# Patient Record
Sex: Female | Born: 1970
Health system: Southern US, Community
[De-identification: ages and names within clinical notes are randomized; demographics above are authoritative.]

## PROBLEM LIST (undated history)

## (undated) DIAGNOSIS — F410 Panic disorder [episodic paroxysmal anxiety] without agoraphobia: Secondary | ICD-10-CM

## (undated) DIAGNOSIS — R569 Unspecified convulsions: Secondary | ICD-10-CM

## (undated) DIAGNOSIS — F4310 Post-traumatic stress disorder, unspecified: Secondary | ICD-10-CM

## (undated) DIAGNOSIS — I10 Essential (primary) hypertension: Secondary | ICD-10-CM

## (undated) DIAGNOSIS — T7840XA Allergy, unspecified, initial encounter: Secondary | ICD-10-CM

## (undated) DIAGNOSIS — E119 Type 2 diabetes mellitus without complications: Secondary | ICD-10-CM

## (undated) DIAGNOSIS — F1910 Other psychoactive substance abuse, uncomplicated: Secondary | ICD-10-CM

## (undated) DIAGNOSIS — G709 Myoneural disorder, unspecified: Secondary | ICD-10-CM

## (undated) DIAGNOSIS — F419 Anxiety disorder, unspecified: Secondary | ICD-10-CM

## (undated) DIAGNOSIS — G9600 Cerebrospinal fluid leak, unspecified: Secondary | ICD-10-CM

## (undated) DIAGNOSIS — G4730 Sleep apnea, unspecified: Secondary | ICD-10-CM

## (undated) DIAGNOSIS — Z9889 Other specified postprocedural states: Secondary | ICD-10-CM

## (undated) DIAGNOSIS — M1990 Unspecified osteoarthritis, unspecified site: Secondary | ICD-10-CM

## (undated) DIAGNOSIS — D649 Anemia, unspecified: Secondary | ICD-10-CM

## (undated) DIAGNOSIS — C569 Malignant neoplasm of unspecified ovary: Secondary | ICD-10-CM

## (undated) DIAGNOSIS — K219 Gastro-esophageal reflux disease without esophagitis: Secondary | ICD-10-CM

## (undated) HISTORY — DX: Anemia, unspecified: D64.9

## (undated) HISTORY — DX: Unspecified convulsions: R56.9

## (undated) HISTORY — DX: Gastro-esophageal reflux disease without esophagitis: K21.9

## (undated) HISTORY — DX: Essential (primary) hypertension: I10

## (undated) HISTORY — PX: KNEE ARTHROSCOPY: SUR90

## (undated) HISTORY — DX: Malignant neoplasm of unspecified ovary: C56.9

## (undated) HISTORY — DX: Panic disorder (episodic paroxysmal anxiety): F41.0

## (undated) HISTORY — PX: SKIN CANCER EXCISION: SHX779

## (undated) HISTORY — DX: Unspecified osteoarthritis, unspecified site: M19.90

## (undated) HISTORY — PX: ABDOMINAL HYSTERECTOMY: SHX81

## (undated) HISTORY — PX: HERNIA REPAIR: SHX51

## (undated) HISTORY — DX: Myoneural disorder, unspecified: G70.9

## (undated) HISTORY — DX: Cerebrospinal fluid leak, unspecified: G96.00

## (undated) HISTORY — PX: PARTIAL HYSTERECTOMY: SHX80

## (undated) HISTORY — DX: Other psychoactive substance abuse, uncomplicated: F19.10

## (undated) HISTORY — DX: Other specified postprocedural states: Z98.890

## (undated) HISTORY — DX: Post-traumatic stress disorder, unspecified: F43.10

## (undated) HISTORY — DX: Anxiety disorder, unspecified: F41.9

## (undated) HISTORY — PX: TONSILLECTOMY: SUR1361

## (undated) HISTORY — PX: CHOLECYSTECTOMY: SHX55

## (undated) HISTORY — DX: Allergy, unspecified, initial encounter: T78.40XA

---

## 1997-02-06 DIAGNOSIS — Z9889 Other specified postprocedural states: Secondary | ICD-10-CM

## 1997-02-06 HISTORY — DX: Other specified postprocedural states: Z98.890

## 1998-01-17 ENCOUNTER — Emergency Department (HOSPITAL_COMMUNITY): Admission: EM | Admit: 1998-01-17 | Discharge: 1998-01-17 | Payer: Self-pay | Admitting: Emergency Medicine

## 1998-03-21 ENCOUNTER — Emergency Department (HOSPITAL_COMMUNITY): Admission: EM | Admit: 1998-03-21 | Discharge: 1998-03-21 | Payer: Self-pay | Admitting: Emergency Medicine

## 1998-11-03 ENCOUNTER — Emergency Department (HOSPITAL_COMMUNITY): Admission: EM | Admit: 1998-11-03 | Discharge: 1998-11-03 | Payer: Self-pay | Admitting: Emergency Medicine

## 2001-02-04 ENCOUNTER — Emergency Department (HOSPITAL_COMMUNITY): Admission: EM | Admit: 2001-02-04 | Discharge: 2001-02-04 | Payer: Self-pay | Admitting: Emergency Medicine

## 2001-02-04 ENCOUNTER — Encounter: Payer: Self-pay | Admitting: Emergency Medicine

## 2001-02-12 ENCOUNTER — Emergency Department (HOSPITAL_COMMUNITY): Admission: EM | Admit: 2001-02-12 | Discharge: 2001-02-12 | Payer: Self-pay | Admitting: *Deleted

## 2001-02-12 ENCOUNTER — Encounter: Payer: Self-pay | Admitting: *Deleted

## 2001-11-28 ENCOUNTER — Ambulatory Visit (HOSPITAL_COMMUNITY): Admission: RE | Admit: 2001-11-28 | Discharge: 2001-11-28 | Payer: Self-pay | Admitting: Pulmonary Disease

## 2002-05-23 ENCOUNTER — Encounter: Payer: Self-pay | Admitting: Emergency Medicine

## 2002-05-23 ENCOUNTER — Emergency Department (HOSPITAL_COMMUNITY): Admission: EM | Admit: 2002-05-23 | Discharge: 2002-05-23 | Payer: Self-pay | Admitting: Emergency Medicine

## 2002-08-03 ENCOUNTER — Emergency Department (HOSPITAL_COMMUNITY): Admission: EM | Admit: 2002-08-03 | Discharge: 2002-08-03 | Payer: Self-pay | Admitting: Emergency Medicine

## 2002-08-04 ENCOUNTER — Encounter: Payer: Self-pay | Admitting: *Deleted

## 2002-08-04 ENCOUNTER — Emergency Department (HOSPITAL_COMMUNITY): Admission: EM | Admit: 2002-08-04 | Discharge: 2002-08-05 | Payer: Self-pay | Admitting: *Deleted

## 2002-11-28 ENCOUNTER — Emergency Department (HOSPITAL_COMMUNITY): Admission: EM | Admit: 2002-11-28 | Discharge: 2002-11-28 | Payer: Self-pay | Admitting: Emergency Medicine

## 2002-12-05 ENCOUNTER — Emergency Department (HOSPITAL_COMMUNITY): Admission: EM | Admit: 2002-12-05 | Discharge: 2002-12-05 | Payer: Self-pay | Admitting: Emergency Medicine

## 2003-07-21 ENCOUNTER — Ambulatory Visit: Admission: RE | Admit: 2003-07-21 | Discharge: 2003-07-21 | Payer: Self-pay | Admitting: Gynecology

## 2003-07-28 ENCOUNTER — Inpatient Hospital Stay (HOSPITAL_COMMUNITY): Admission: RE | Admit: 2003-07-28 | Discharge: 2003-07-29 | Payer: Self-pay | Admitting: Obstetrics

## 2003-07-28 ENCOUNTER — Encounter (INDEPENDENT_AMBULATORY_CARE_PROVIDER_SITE_OTHER): Payer: Self-pay | Admitting: Specialist

## 2004-02-11 ENCOUNTER — Emergency Department (HOSPITAL_COMMUNITY): Admission: EM | Admit: 2004-02-11 | Discharge: 2004-02-11 | Payer: Self-pay | Admitting: Emergency Medicine

## 2004-05-08 ENCOUNTER — Emergency Department (HOSPITAL_COMMUNITY): Admission: EM | Admit: 2004-05-08 | Discharge: 2004-05-09 | Payer: Self-pay | Admitting: Emergency Medicine

## 2004-07-05 ENCOUNTER — Emergency Department (HOSPITAL_COMMUNITY): Admission: EM | Admit: 2004-07-05 | Discharge: 2004-07-05 | Payer: Self-pay | Admitting: Emergency Medicine

## 2004-09-29 ENCOUNTER — Emergency Department (HOSPITAL_COMMUNITY): Admission: EM | Admit: 2004-09-29 | Discharge: 2004-09-29 | Payer: Self-pay | Admitting: Emergency Medicine

## 2005-03-30 ENCOUNTER — Emergency Department (HOSPITAL_COMMUNITY): Admission: EM | Admit: 2005-03-30 | Discharge: 2005-03-30 | Payer: Self-pay | Admitting: Emergency Medicine

## 2005-09-14 ENCOUNTER — Emergency Department (HOSPITAL_COMMUNITY): Admission: EM | Admit: 2005-09-14 | Discharge: 2005-09-14 | Payer: Self-pay | Admitting: Emergency Medicine

## 2005-11-08 ENCOUNTER — Ambulatory Visit (HOSPITAL_COMMUNITY): Admission: RE | Admit: 2005-11-08 | Discharge: 2005-11-08 | Payer: Self-pay | Admitting: Internal Medicine

## 2006-02-06 DIAGNOSIS — Z9889 Other specified postprocedural states: Secondary | ICD-10-CM

## 2006-02-06 HISTORY — PX: ESOPHAGOGASTRODUODENOSCOPY: SHX1529

## 2006-02-06 HISTORY — DX: Other specified postprocedural states: Z98.890

## 2006-05-28 ENCOUNTER — Emergency Department (HOSPITAL_COMMUNITY): Admission: EM | Admit: 2006-05-28 | Discharge: 2006-05-28 | Payer: Self-pay | Admitting: Emergency Medicine

## 2006-10-20 ENCOUNTER — Emergency Department (HOSPITAL_COMMUNITY): Admission: EM | Admit: 2006-10-20 | Discharge: 2006-10-21 | Payer: Self-pay | Admitting: *Deleted

## 2006-10-24 ENCOUNTER — Ambulatory Visit (HOSPITAL_COMMUNITY): Admission: RE | Admit: 2006-10-24 | Discharge: 2006-10-24 | Payer: Self-pay | Admitting: Internal Medicine

## 2006-11-16 ENCOUNTER — Ambulatory Visit: Payer: Self-pay | Admitting: Gastroenterology

## 2006-11-27 ENCOUNTER — Ambulatory Visit: Payer: Self-pay | Admitting: Gastroenterology

## 2006-11-27 ENCOUNTER — Ambulatory Visit (HOSPITAL_COMMUNITY): Admission: RE | Admit: 2006-11-27 | Discharge: 2006-11-27 | Payer: Self-pay | Admitting: Gastroenterology

## 2006-11-27 ENCOUNTER — Encounter: Payer: Self-pay | Admitting: Gastroenterology

## 2007-01-09 ENCOUNTER — Ambulatory Visit: Payer: Self-pay | Admitting: Gastroenterology

## 2007-01-10 ENCOUNTER — Ambulatory Visit (HOSPITAL_COMMUNITY): Admission: RE | Admit: 2007-01-10 | Discharge: 2007-01-10 | Payer: Self-pay | Admitting: Internal Medicine

## 2007-01-18 ENCOUNTER — Ambulatory Visit (HOSPITAL_COMMUNITY): Admission: RE | Admit: 2007-01-18 | Discharge: 2007-01-18 | Payer: Self-pay | Admitting: Gastroenterology

## 2007-02-15 ENCOUNTER — Emergency Department (HOSPITAL_COMMUNITY): Admission: EM | Admit: 2007-02-15 | Discharge: 2007-02-15 | Payer: Self-pay | Admitting: Emergency Medicine

## 2007-02-22 ENCOUNTER — Ambulatory Visit: Payer: Self-pay | Admitting: Gastroenterology

## 2007-02-26 ENCOUNTER — Encounter (HOSPITAL_COMMUNITY): Admission: RE | Admit: 2007-02-26 | Discharge: 2007-03-28 | Payer: Self-pay | Admitting: Gastroenterology

## 2007-04-23 ENCOUNTER — Encounter: Payer: Self-pay | Admitting: Orthopedic Surgery

## 2007-04-23 ENCOUNTER — Ambulatory Visit: Payer: Self-pay | Admitting: Gastroenterology

## 2007-04-26 ENCOUNTER — Ambulatory Visit (HOSPITAL_COMMUNITY): Admission: RE | Admit: 2007-04-26 | Discharge: 2007-04-26 | Payer: Self-pay | Admitting: Gastroenterology

## 2007-06-10 ENCOUNTER — Emergency Department (HOSPITAL_COMMUNITY): Admission: EM | Admit: 2007-06-10 | Discharge: 2007-06-10 | Payer: Self-pay | Admitting: Emergency Medicine

## 2007-06-13 ENCOUNTER — Emergency Department (HOSPITAL_COMMUNITY): Admission: EM | Admit: 2007-06-13 | Discharge: 2007-06-13 | Payer: Self-pay | Admitting: Emergency Medicine

## 2007-08-27 ENCOUNTER — Emergency Department (HOSPITAL_COMMUNITY): Admission: EM | Admit: 2007-08-27 | Discharge: 2007-08-27 | Payer: Self-pay | Admitting: Emergency Medicine

## 2007-10-22 ENCOUNTER — Ambulatory Visit: Payer: Self-pay | Admitting: Internal Medicine

## 2007-11-19 ENCOUNTER — Ambulatory Visit: Payer: Self-pay | Admitting: Gastroenterology

## 2007-12-04 ENCOUNTER — Ambulatory Visit: Payer: Self-pay | Admitting: Gastroenterology

## 2008-04-07 ENCOUNTER — Ambulatory Visit (HOSPITAL_COMMUNITY): Admission: RE | Admit: 2008-04-07 | Discharge: 2008-04-07 | Payer: Self-pay | Admitting: Obstetrics and Gynecology

## 2008-05-06 ENCOUNTER — Ambulatory Visit (HOSPITAL_COMMUNITY): Admission: RE | Admit: 2008-05-06 | Discharge: 2008-05-06 | Payer: Self-pay | Admitting: General Surgery

## 2008-06-04 ENCOUNTER — Encounter: Payer: Self-pay | Admitting: Orthopedic Surgery

## 2008-06-17 ENCOUNTER — Ambulatory Visit: Admission: RE | Admit: 2008-06-17 | Discharge: 2008-06-17 | Payer: Self-pay | Admitting: Neurology

## 2008-07-31 ENCOUNTER — Encounter: Payer: Self-pay | Admitting: Gastroenterology

## 2008-09-03 ENCOUNTER — Other Ambulatory Visit: Payer: Self-pay | Admitting: Emergency Medicine

## 2008-09-03 ENCOUNTER — Ambulatory Visit: Payer: Self-pay | Admitting: Psychiatry

## 2008-09-04 ENCOUNTER — Inpatient Hospital Stay (HOSPITAL_COMMUNITY): Admission: AD | Admit: 2008-09-04 | Discharge: 2008-09-08 | Payer: Self-pay | Admitting: Psychiatry

## 2008-09-05 ENCOUNTER — Other Ambulatory Visit: Payer: Self-pay | Admitting: Emergency Medicine

## 2008-10-07 ENCOUNTER — Encounter: Payer: Self-pay | Admitting: Orthopedic Surgery

## 2008-10-28 ENCOUNTER — Emergency Department (HOSPITAL_COMMUNITY): Admission: EM | Admit: 2008-10-28 | Discharge: 2008-10-28 | Payer: Self-pay | Admitting: Emergency Medicine

## 2008-11-04 ENCOUNTER — Encounter: Payer: Self-pay | Admitting: Orthopedic Surgery

## 2008-11-11 ENCOUNTER — Ambulatory Visit: Payer: Self-pay | Admitting: Orthopedic Surgery

## 2008-11-11 DIAGNOSIS — M5430 Sciatica, unspecified side: Secondary | ICD-10-CM | POA: Insufficient documentation

## 2008-11-13 ENCOUNTER — Encounter: Payer: Self-pay | Admitting: Orthopedic Surgery

## 2008-11-20 ENCOUNTER — Encounter: Payer: Self-pay | Admitting: Orthopedic Surgery

## 2008-12-01 ENCOUNTER — Telehealth: Payer: Self-pay | Admitting: Orthopedic Surgery

## 2009-03-09 DIAGNOSIS — D649 Anemia, unspecified: Secondary | ICD-10-CM | POA: Insufficient documentation

## 2009-03-09 DIAGNOSIS — K219 Gastro-esophageal reflux disease without esophagitis: Secondary | ICD-10-CM | POA: Insufficient documentation

## 2009-03-09 DIAGNOSIS — G40909 Epilepsy, unspecified, not intractable, without status epilepticus: Secondary | ICD-10-CM | POA: Insufficient documentation

## 2009-03-09 DIAGNOSIS — K5900 Constipation, unspecified: Secondary | ICD-10-CM | POA: Insufficient documentation

## 2009-03-10 ENCOUNTER — Ambulatory Visit: Payer: Self-pay | Admitting: Gastroenterology

## 2009-03-19 ENCOUNTER — Telehealth (INDEPENDENT_AMBULATORY_CARE_PROVIDER_SITE_OTHER): Payer: Self-pay

## 2009-03-31 ENCOUNTER — Encounter: Payer: Self-pay | Admitting: Orthopedic Surgery

## 2009-03-31 ENCOUNTER — Ambulatory Visit (HOSPITAL_COMMUNITY): Admission: RE | Admit: 2009-03-31 | Discharge: 2009-03-31 | Payer: Self-pay | Admitting: Family Medicine

## 2009-04-09 ENCOUNTER — Ambulatory Visit (HOSPITAL_COMMUNITY)
Admission: RE | Admit: 2009-04-09 | Discharge: 2009-04-09 | Payer: Self-pay | Source: Home / Self Care | Admitting: Family Medicine

## 2009-04-09 ENCOUNTER — Encounter: Payer: Self-pay | Admitting: Orthopedic Surgery

## 2009-04-26 ENCOUNTER — Ambulatory Visit: Payer: Self-pay | Admitting: Orthopedic Surgery

## 2009-04-29 ENCOUNTER — Encounter (INDEPENDENT_AMBULATORY_CARE_PROVIDER_SITE_OTHER): Payer: Self-pay | Admitting: *Deleted

## 2009-05-04 ENCOUNTER — Telehealth: Payer: Self-pay | Admitting: Orthopedic Surgery

## 2009-05-04 ENCOUNTER — Encounter: Payer: Self-pay | Admitting: Orthopedic Surgery

## 2009-05-13 ENCOUNTER — Encounter: Payer: Self-pay | Admitting: Orthopedic Surgery

## 2009-05-20 ENCOUNTER — Telehealth: Payer: Self-pay | Admitting: Orthopedic Surgery

## 2009-05-27 ENCOUNTER — Encounter: Payer: Self-pay | Admitting: Gastroenterology

## 2009-09-16 ENCOUNTER — Ambulatory Visit: Payer: Self-pay | Admitting: Gastroenterology

## 2009-09-17 ENCOUNTER — Telehealth: Payer: Self-pay | Admitting: Gastroenterology

## 2009-11-04 ENCOUNTER — Encounter: Payer: Self-pay | Admitting: Gastroenterology

## 2010-02-06 HISTORY — PX: ESOPHAGOGASTRODUODENOSCOPY: SHX1529

## 2010-02-23 ENCOUNTER — Emergency Department (HOSPITAL_COMMUNITY)
Admission: EM | Admit: 2010-02-23 | Discharge: 2010-02-23 | Payer: Self-pay | Source: Home / Self Care | Admitting: Emergency Medicine

## 2010-02-27 ENCOUNTER — Encounter: Payer: Self-pay | Admitting: Internal Medicine

## 2010-02-28 LAB — DIFFERENTIAL
Eosinophils Relative: 1 % (ref 0–5)
Lymphs Abs: 2.3 10*3/uL (ref 0.7–4.0)
Monocytes Absolute: 0.4 10*3/uL (ref 0.1–1.0)
Monocytes Relative: 7 % (ref 3–12)
Neutro Abs: 2.2 10*3/uL (ref 1.7–7.7)

## 2010-02-28 LAB — CBC
HCT: 38.4 % (ref 36.0–46.0)
Hemoglobin: 13.1 g/dL (ref 12.0–15.0)
MCV: 69.9 fL — ABNORMAL LOW (ref 78.0–100.0)
Platelets: 249 10*3/uL (ref 150–400)
RBC: 5.49 MIL/uL — ABNORMAL HIGH (ref 3.87–5.11)
WBC: 5.1 10*3/uL (ref 4.0–10.5)

## 2010-02-28 LAB — BASIC METABOLIC PANEL
CO2: 26 mEq/L (ref 19–32)
Calcium: 9.6 mg/dL (ref 8.4–10.5)
Creatinine, Ser: 1.03 mg/dL (ref 0.4–1.2)
GFR calc Af Amer: >60 mL/min (ref >60)
GFR calc non Af Amer: 60 mL/min — ABNORMAL LOW (ref >60)
Glucose, Bld: 89 mg/dL (ref 70–99)
Sodium: 142 mEq/L (ref 135–145)

## 2010-03-08 ENCOUNTER — Encounter: Payer: Self-pay | Admitting: Internal Medicine

## 2010-03-08 ENCOUNTER — Ambulatory Visit
Admission: RE | Admit: 2010-03-08 | Discharge: 2010-03-08 | Payer: Self-pay | Source: Home / Self Care | Attending: Gastroenterology | Admitting: Gastroenterology

## 2010-03-09 ENCOUNTER — Encounter (HOSPITAL_COMMUNITY): Payer: Medicaid Other | Attending: Internal Medicine

## 2010-03-10 ENCOUNTER — Other Ambulatory Visit: Payer: Self-pay | Admitting: Internal Medicine

## 2010-03-10 ENCOUNTER — Encounter: Payer: Self-pay | Admitting: Internal Medicine

## 2010-03-10 ENCOUNTER — Encounter: Payer: Medicaid Other | Admitting: Internal Medicine

## 2010-03-10 ENCOUNTER — Ambulatory Visit (HOSPITAL_COMMUNITY)
Admission: RE | Admit: 2010-03-10 | Discharge: 2010-03-10 | Disposition: A | Discharge status: Home / Self Care | Payer: Medicaid Other | Attending: Internal Medicine | Admitting: Internal Medicine

## 2010-03-10 DIAGNOSIS — K449 Diaphragmatic hernia without obstruction or gangrene: Secondary | ICD-10-CM

## 2010-03-10 DIAGNOSIS — R109 Unspecified abdominal pain: Secondary | ICD-10-CM | POA: Insufficient documentation

## 2010-03-10 DIAGNOSIS — R1310 Dysphagia, unspecified: Secondary | ICD-10-CM

## 2010-03-10 DIAGNOSIS — R110 Nausea: Secondary | ICD-10-CM

## 2010-03-10 NOTE — Letter (Signed)
Summary: Office notes,Records Dr Gerilyn Pilgrim  Office notes,Records Dr Gerilyn Pilgrim   Imported By: Cammie Sickle 04/23/2009 17:59:16  _____________________________________________________________________  External Attachment:    Type:   Image     Comment:   External Document

## 2010-03-10 NOTE — Assessment & Plan Note (Signed)
Summary: E30/ACID REFLUX,GU   Visit Type:  f/u Primary Care Provider:  Sudie Bailey  Chief Complaint:  reflux.  History of Present Illness: Jennifer [name] is here for f/u visit. Last seen in 10/09 for chronic GERD/LPR, constipation, mild anemia. She has had extensive w/u in past including GES, MRI abd, CT a/p X2, barium swallow, EGD 2008.  She returns with c/o multiple GI issues. She c/o severe nocturnal reflux, wakes up gagging and has difficulty catching her breath. She wakes up sometimes with "spit up" on her gown. She coughs a lot at night. Currently not on anything for reflux. She has gained 45 pounds in the last two years.  Previously on Nexium two times a day and omeprazole two times a day without relief. Never tried Aciphex, Prevacid, Zegerid, Protonix. She tells me she is hungry all the time. She goes to bed at 6:30pm after taking her Seroquel. She wakes up at 10am. She lays down right after evening meal. She admits to getting up in the middle of the night and eating a snack which may consist of bologna sandwich, hotdog, oatmeal cookie, or ice cream. She lays back down immediately afterwards. She is smoking one ppd of cigarettes between the hours of 10am and 6pm. She c/o constipation, having to apply vaginal pressure to have a BM. No melena or brbpr. Never had TCS. C/O RUQ pinprick sensation which is positional.         Current Medications (verified): 1)  Albuterol Inhaler .... As Needed 2)  Miralax .... As Needed 3)  Estradiol 1 Mg .... Once Daily 4)  Benefiber .... Once Daily 5)  Flovent .... As Needed 6)  Trazodone 100mg  .... At Bedtime 7)  Seroquel 300 Mg Tabs (Quetiapine Fumarate) .... At Bedtime 8)  Diazepam 5 Mg Tabs (Diazepam) 9)  Keppra 750 Mg Tabs (Levetiracetam) .... Three Times A Day 10)  Robaxin-750 750 Mg Tabs (Methocarbamol) .... Three Times A Day  Allergies (verified): 1)  ! * Ivp Dye  Past History:  Past Medical History: seizures PTSD panic  attacks GERD asthma anxiety schizophrenia Bipolar D/O constipation Flex sig, 1999, normal to 60cm  Past Surgical History: hysterectomy, serous cystadenoma and right adnexal stricture, bilateral retroperitoneal fibrosis gallbladder tonsillectomy incisional hernia repair 04/2008  Family History: FH of Cancer:  Family History of Diabetes Family History of Arthritis Maternal GF colon cancer, prostate cancer? Mat GM breast cancer? 2 cousins and aunt, crohn's disease  Social History: Patient is married. Stepchildren, stepdgt breast cancer 20 yo.  Disabilityfor Schizophrenia, Bipolar, Seizures Illiterate 1ppd, down from 2 ppd. No alcohol, no drugs.  Review of Systems General:  Denies fever, chills, anorexia, fatigue, and weight loss. Eyes:  Denies vision loss. ENT:  Denies nasal congestion, hoarseness, and difficulty swallowing. CV:  Denies chest pains, angina, palpitations, dyspnea on exertion, orthopnea, and peripheral edema. Resp:  Complains of cough; denies dyspnea at rest and dyspnea with exercise. GI:  See HPI; Denies difficulty swallowing and pain on swallowing. GU:  Denies urinary burning and blood in urine. MS:  Denies joint pain / LOM. Derm:  Denies rash and itching. Neuro:  Denies frequent headaches, memory loss, and confusion. Psych:  Complains of depression and anxiety; denies memory loss, suicidal ideation, and hallucinations. Endo:  Denies unusual weight change. Heme:  Denies bruising and bleeding. Allergy:  Denies hives and rash.  Vital Signs:  Patient profile:   40 year old female Height:      66 inches Weight:      229  pounds BMI:     37.10 Temp:     98.5 degrees F oral Pulse rate:   68 / minute BP sitting:   112 / 72  (left arm) Cuff size:   regular  Vitals Entered By: Hendricks Limes LPN (March 10, 2009 10:42 AM)  Physical Exam  General:  Well developed, well nourished, no acute distress.obese.   Head:  Normocephalic and atraumatic. Eyes:   Conjunctivae pink, no scleral icterus.  Mouth:  Oropharyngeal mucosa moist, pink.  No lesions, erythema or exudate.    Neck:  Supple; no masses or thyromegaly. Lungs:  Clear throughout to auscultation. Heart:  Regular rate and rhythm; no murmurs, rubs,  or bruits. Abdomen:  Bowel sounds normal.  Abdomen is soft, nontender, nondistended.  No rebound or guarding.  No hepatosplenomegaly, masses or hernias.  No abdominal bruits. obese.   Extremities:  No clubbing, cyanosis, edema or deformities noted. Neurologic:  Alert and  oriented x4;  grossly normal neurologically. Skin:  Intact without significant lesions or rashes. Cervical Nodes:  No significant cervical adenopathy. Psych:  Alert and cooperative. Normal mood and affect.  Impression & Recommendations:  Problem # 1:  GASTROESOPHAGEAL REFLUX DISEASE, CHRONIC (ICD-530.81)  Refractory GERD symptoms, especially at night. She is sleeping about 16 hours each night. She is eating during the middle of the night and laying back down. She has had significant weight gain in the last two years. She is very inactive. Discussed for at least 20 minutes, the need for her to following anti-reflux measures. She needs to consume low-fat/low acidic foods. She must not eat during the middle of the night. Always wait 3 hours before laying down after a meal. Will start Aciphex. See patient instructions for details. OV in 3 weeks with Dr. Darrick Penna.   Orders: Est. Patient Level IV (04540)  Problem # 2:  CONSTIPATION (ICD-564.00)  Take Miralax daily. High fiber diet. Increase physical activity and consume at least 64 ounces of water daily.   Orders: Est. Patient Level IV (98119)  Problem # 3:  PSYCHIATRIC DISORDER (ICD-300.9)  Encouraged her to f/u with her psychiatrist. She needs to make them aware that she is sleeping 16 hours per day. This is contributing to her weight gain, GERD symptoms, and overall is not good for her health. She needs to be more alert  during the day and maintain physical activity.   Orders: Est. Patient Level IV (14782)  Patient Instructions: 1)  Begin Aciphex one by mouth 30 mins before last meal of day. Samples provided. If helps, take prescription to pharmacy to have filled. 2)  Take Miralax or generic one capful daily EVERY DAY. 3)  Please schedule a follow-up appointment in 3 weeks.  4)  Avoid foods high in acid content ( tomatoes, citrus juices, spicy foods) . Avoid eating within 3 to 4 hours of lying down or before exercising. Do not over eat; try smaller more frequent meals. Elevate head of bed four inches when sleeping.  DO NOT EAT DURING THE MIDDLE OF THE NIGHT. 5)  You need to discuss medications with Dr. Betti Cruz. You are sleeping too much. Increase activity as tolerated.  6)  High Fiber, Low Fat  Healthy Eating Plan brochure given.  7)  The medication list was reviewed and reconciled.  All changed / newly prescribed medications were explained.  A complete medication list was provided to the patient / caregiver. Prescriptions: ACIPHEX 20 MG TBEC (RABEPRAZOLE SODIUM) one by mouth 30 mins before last meal  of day.  #30 x 11   Entered and Authorized by:   Leanna Battles. Dixon Boos   Signed by:   Leanna Battles Abbye Lao PA-C on 03/10/2009   Method used:   Print then Give to Patient   RxID:   [id number]

## 2010-03-10 NOTE — Letter (Signed)
Summary: Internal Other  Internal Other   Imported By: Elvera Maria 05/07/2009 10:38:54  _____________________________________________________________________  External Attachment:    Type:   Image     Comment:   release of info

## 2010-03-10 NOTE — Medication Information (Signed)
Summary: PA for aciphex  PA for aciphex   Imported By: Hendricks Limes LPN 16/11/9602 54:09:81  _____________________________________________________________________  External Attachment:    Type:   Image     Comment:   External Document

## 2010-03-10 NOTE — Letter (Signed)
Summary: [facility]   [facility]   Imported By: Rexene Alberts 11/04/2009 10:07:12  _____________________________________________________________________  External Attachment:    Type:   Image     Comment:   External Document

## 2010-03-10 NOTE — Letter (Signed)
Summary: Historic Patient File  Historic Patient File   Imported By: Elvera Maria 05/07/2009 10:37:12  _____________________________________________________________________  External Attachment:    Type:   Image     Comment:   history form

## 2010-03-10 NOTE — Progress Notes (Signed)
Summary: Pt lost Rx for Aciphex  Phone Note Call from Patient   Summary of Call: Pt called and said she lost her Rx for Aciphex, and could we fax Rx to [facility]. Initial call taken by: Cloria Spring LPN,  March 19, 2009 9:26 AM     Appended Document: Pt lost Rx for Aciphex    Prescriptions: ACIPHEX 20 MG TBEC (RABEPRAZOLE SODIUM) one by mouth 30 mins before last meal of day.  #30 x 11   Entered and Authorized by:   Joselyn Arrow FNP-BC   Signed by:   Joselyn Arrow FNP-BC on 03/19/2009   Method used:   Electronically to        [facility] (retail)       [address]       [place], [place]  [address]       Ph: [phone]       Fax: [phone]   RxID:   [id number]

## 2010-03-10 NOTE — Progress Notes (Signed)
Summary: Pt c/o pain in throat when smoking  Phone Note Call from Patient   Caller: Patient Summary of Call: Pt called and said she is having sharp pain on either side of her throat when she smokes a cigarette. Sometimes other than when she is smoking, but mostly when she is inhaling and exhaling when smoking. Please advise! Initial call taken by: Cloria Spring LPN,  March 19, 2009 1:33 PM     Appended Document: Pt c/o pain in throat when smoking Recommend she see her PCP for this symptom. Not likely GI problem. If SOB, chest pain associated with it she should go to ED, otherwise see PCP today.  Appended Document: Pt c/o pain in throat when smoking Called, phone rang 8-10 times and no answer.  Appended Document: Pt c/o pain in throat when smoking Pt informed.

## 2010-03-10 NOTE — Miscellaneous (Signed)
Summary: referred pt to [facility], faxed all notes  Clinical Lists Changes

## 2010-03-10 NOTE — Medication Information (Signed)
Summary: [employer]   Imported By: Cammie Sickle 05/04/2009 16:09:24  _____________________________________________________________________  External Attachment:    Type:   Image     Comment:   External Document

## 2010-03-10 NOTE — Consult Note (Signed)
Summary: Consult note from Dr. Phoebe Perch  Consult note from Dr. Phoebe Perch   Imported By: Jacklynn Ganong 06/07/2009 10:47:42  _____________________________________________________________________  External Attachment:    Type:   Image     Comment:   External Document

## 2010-03-10 NOTE — Progress Notes (Signed)
Summary: patient had neurosurg consult 05/19/09  Phone Note Call from Patient   Caller: Patient Summary of Call: Patient called to relay that she had her neurosurgery consult yesterday, 05/19/09, with Dr Phoebe Perch at [facility].  States he is referring her for PT and then "back to Dr Romeo Apple."   She is asking about a possible refill on Methylpredntsolone 4 mg, which she states Dr Phoebe Perch gave her a 1 wk Rx for, from [place].  Advised patient Dr Romeo Apple will need to review report when it is received, and advised to follow up with their office and prescribing physician if question on Rx. Initial call taken by: Cammie Sickle,  May 20, 2009 10:01 AM     Appended Document: patient had neurosurg consult 05/19/09 no further appt needed here   all back and radicular pain refer to Dr Phoebe Perch  ok to refill dose pack

## 2010-03-10 NOTE — Assessment & Plan Note (Signed)
Summary: GERD CONSTIPATION   Visit Type:  Follow-up Visit Primary Care Provider:  Sudie Bailey, M.D.  Chief Complaint:  follow up.  History of Present Illness: Smoking 1-1.5 pks a day. No nausea or vomiting. Wakes up in the middle of night and feels like something is hung. Taking the pills after she eats. Yesterday ate: breakfast-boiled eggs(3), fish sticks, rare soda, botlled water, roast/cream potatoes, string beans/onions w/ fatback. MAY EAT FRIED BOLOGNA for breakfast.  <1 BM per week. Uses finger in vagina to push stool out. Owes Dr. Emelda Fear $40 and wants to go to Dr. Ralph Dowdy.   Current Medications (verified): 1)  Albuterol Neb Tx .... As Needed 2)  Miralax .... As Needed 3)  Estradiol 1 Mg .... Once Daily 4)  Flovent .... As Needed 5)  Seroquel 300 Mg Tabs (Quetiapine Fumarate) .... At Bedtime 6)  Diazepam 5 Mg Tabs (Diazepam) 7)  Keppra 750 Mg Tabs (Levetiracetam) .... Three Times A Day 8)  Aciphex 20 Mg Tbec (Rabeprazole Sodium) .... One By Mouth 30 Mins Before Last Meal of Day. 9)  Lyrica 100 Mg Caps (Pregabalin) .... Two Times A Day 10)  Diclofenac Sodium 75 Mg Tbec (Diclofenac Sodium) .... Two Times A Day 11)  Hydrochlorothiazide 25 Mg Tabs (Hydrochlorothiazide) .... Once Daily  Allergies (verified): 1)  ! * Ivp Dye  Past History:  Past Surgical History: Last updated: 03/10/2009 hysterectomy, serous cystadenoma and right adnexal stricture, bilateral retroperitoneal fibrosis gallbladder tonsillectomy incisional hernia repair 04/2008  Past Medical History: seizures PTSD panic attacks GERD **MAR 2009 BASW-MILD GERD, o/w NL asthma anxiety schizophrenia Bipolar D/O NAUSEA-NL GES JAN 2009 OCT 2008: EGD-bX: MILD GASTRITIS Constipation, SINCE 1999 **2009: CTAP W/O CM-retained stool, HEME NEGx3 DCBE/Flex sig, 1999, normal to 60cm (RMR)  Review of Systems       2008: CTAP W/CM-3 CM R HEPATIC LOBE MASS-->MRI ?STABLE CYST unchanged over 5 years JAN 2009: 184 LBS SEP  2009: 196 LBS  Vital Signs:  Patient profile:   40 year old female Height:      67 inches Weight:      217 pounds BMI:     34.11 Temp:     98.9 degrees F oral Pulse rate:   88 / minute BP sitting:   114 / 70  (left arm) Cuff size:   regular  Vitals Entered By: Hendricks Limes LPN (September 16, 2009 4:03 PM)  Physical Exam  General:  Well developed, well nourished, no acute distress. Head:  Normocephalic and atraumatic. Lungs:  Clear throughout to auscultation. Heart:  Regular rate and rhythm; no murmurs. Abdomen:  Soft, nontender and nondistended. Normal bowel sounds. Neurologic:  Alert and  oriented x4;  NO GROSS DEFICITS  Impression & Recommendations:  Problem # 1:  GASTROESOPHAGEAL REFLUX DISEASE, CHRONIC (ICD-530.81) Pt is 33 lbs heavier than she was in 2009. Continue Aciphex and take 30 minutes before last meal. FOLLOW REFLUX HO RECOMMENDATIONS. Continue weight loss. Stop smoking. Low fat diet. Follow up in 6 mos.  Problem # 2:  CONSTIPATION (ICD-564.00) Assessment: Unchanged May have a rectocele. Drink 6-8 cups of water a day.  High fiber/low fat diet. Avoid foods that cause bloatig and gas.    See Dr. Ralph Dowdy to evaluate for rectocele. Consider ARM and SITZ MArker study.  CC: PCP and Dr. Ralph Dowdy  Patient Instructions: 1)  Drink 6-8 cups of water a day.  2)  High fiber/low fat diet. 3)  Avoid foods that cause bloatig and gas. 4)  Continue Aciphex and take  30 minutes before last meal. 5)  FOLLOW REFLUX HO RECOMMENDATIONS 6)  **Continue weight loss. Stop smoking. Low fat diet. 7)  Follow up in 6 mos. 8)  See Dr. Ralph Dowdy to evaluate for rectocele. 9)  The medication list was reviewed and reconciled.  All changed / newly prescribed medications were explained.  A complete medication list was provided to the patient / caregiver.  Appended Document: Orders Update    Clinical Lists Changes  Orders: Added new Service order of Est. Patient Level IV (16109) - Signed

## 2010-03-10 NOTE — Assessment & Plan Note (Signed)
Summary: RE-CK/POSSIBLE STRENGTH EXER/HAD XR+MRI/REF [place]...   Vital Signs:  Patient profile:   40 year old female Height:      67 inches Weight:      224 pounds Pulse rate:   66 / minute Resp:     16 per minute  Vitals Entered By: Ether Griffins (April 26, 2009 2:51 PM)  Visit Type:  Initial Consult Referring Provider:  Dr. Sudie Bailey Primary Provider:  Sudie Bailey  CC:  low back pain.  History of Present Illness: 40 year old black female with a 6 month history of atraumatic onset of pain in her lower back and radiating down her LEFT leg.  She describes a burning pain in the LEFT thigh radiating into the LEFT calf associated with leg weakness.  Her symptoms currently come and go worse in the morning and at night associated with some giving out of the LEFT leg and associated with numbness.  She is currently ambulating with a cane.  She describes her pain as an 8/10   Xray and MRI for review from [facility] 04/09/09.  Meds: Robaxin, Aciphex, Estradiol, Keppra, Diazepam, Seroquel, Ventolin, Miralax.    Allergies: 1)  ! * Ivp Dye  Family History: FH of Cancer:  Family History of Diabetes Family History of Arthritis Maternal GF colon cancer, prostate cancer? Mat GM breast cancer? 2 cousins and aunt, crohn's disease Hx, family, asthma  Social History: Patient is married. Stepchildren, stepdgt breast cancer 71 yo.  Disabilityfor Schizophrenia, Bipolar, Seizures Illiterate 1ppd, down from 2 ppd. No alcohol, no drugs. caffeine regularly  Review of Systems Constitutional:  Complains of weight gain and fatigue; denies weight loss, fever, and chills. Cardiovascular:  Complains of chest pain; denies palpitations, fainting, and murmurs. Respiratory:  Complains of short of breath and tightness; denies wheezing, couch, pain on inspiration, and snoring . Gastrointestinal:  Complains of heartburn and nausea; denies vomiting, diarrhea, constipation, and blood in your  stools. Genitourinary:  Complains of frequency; denies urgency, difficulty urinating, painful urination, flank pain, and bleeding in urine. Neurologic:  Complains of seizure; denies numbness, tingling, unsteady gait, dizziness, and tremors. Musculoskeletal:  Complains of joint pain, stiffness, heat, and muscle pain; denies swelling, instability, and redness. Endocrine:  Complains of excessive thirst and heat or cold intolerance; denies exessive urination. Psychiatric:  Complains of depression and anxiety; denies nervousness and hallucinations. Skin:  Denies changes in the skin, poor healing, rash, itching, and redness. HEENT:  Complains of blurred or double vision; denies eye pain, redness, and watering. Immunology:  Denies seasonal allergies, sinus problems, and allergic to bee stings. Hemoatologic:  Complains of brusing; denies easy bleeding.  Physical Exam  Additional Exam:  GEN: well developed, well nourished, normal grooming and hygiene, no deformity and body habitus is medium to large  CDV: pulses are normal, no edema, no erythema. no tenderness  Lymph: normal lymph nodes   Skin: no rashes, skin lesions or open sores   NEURO: normal coordination, reflexes, sensation. straight leg raise is positive at 40 on the LEFT cross leg straight leg raise negative  Psyche: awake, alert and oriented. Mood normal   Gait: abnormal using a cane  Bilateral hip examination reveals no tenderness or swelling range of motion is full strength is normal in both lower extremities and both hips are stable  She has some tenderness in her lower back.     Impression & Recommendations:  Problem # 1:  H N P-LUMBAR (ICD-722.10) Assessment Deteriorated  Her symptoms are worse than her x-rays.  She is already had an MRI of the spine a 04/09/09 and she has a small annular tear LEFT foraminal disc protrusion in his scissors no impingement on the nerve but her symptoms say otherwise.  Orders: Est.  Patient Level IV (16109)  Patient Instructions: 1)  Referral to Neurosurgery left leg radiculopathy, urinary incontinence

## 2010-03-10 NOTE — Progress Notes (Signed)
Summary: Neurosurgeon appointment.  Phone Note From Other Clinic   Caller: Referral Coordinator Reason for Call: Schedule Patient Appt Summary of Call: Patient has an appointment with Dr. Phoebe Perch at [facility] on 06-25-09 at 12:45. Patient is aware of her appointment and to take her films. Initial call taken by: Waldon Reining,  May 04, 2009 5:01 PM

## 2010-03-10 NOTE — Progress Notes (Signed)
Summary: phone note ref to appt with Dr. Ralph Dowdy  Phone Note Call from Patient   Caller: Patient Summary of Call: Pt called. Was not able to get in touch with Dr. Radene Knee office today. Wants to know if our office can schedule the appt with him. Wants to know if Dr. Darrick Penna has any suggestions in the meantime of what she can do, she tried to have a BM and it feels like stool still there. Please advise! Initial call taken by: Cloria Spring LPN,  September 17, 2009 2:18 PM     Appended Document: phone note ref to appt with Dr. Ralph Dowdy Please call pt. She shoud Drink 6-8 cups of water a day.  High fiber/low fat diet. Avoid foods that cause bloating and gas. Use MOM capsules or liquid twice a day until bowels move. Repeat every week if needed.  Appended Document: phone note ref to appt with Dr. Darra Lis to call.  Appended Document: phone note ref to appt with Dr. Ralph Dowdy Pt informed of the above. She now wants referred to [facility]....said she and Dr. Darrick Penna spoke about it yesterday. Please advise who you would like her referred to.  Appended Document: phone note ref to appt with Dr. Ralph Dowdy Refer pt to [facility]YN [facility], Dx: constipation, ?rectocele.  Appended Document: phone note ref to appt with Dr. Ralph Dowdy apt has been made with GYN [facility]: Dr. Alfonzo Feller on Weds 09/29/2009@2 :30  I spoke with Arline Asp Jennifer [name] is aware of the apt  [place]

## 2010-03-10 NOTE — Letter (Signed)
Summary: INSURANCE CARD  INSURANCE CARD   Imported By: Rexene Alberts 09/16/2009 16:08:43  _____________________________________________________________________  External Attachment:    Type:   Image     Comment:   External Document

## 2010-03-11 ENCOUNTER — Telehealth (INDEPENDENT_AMBULATORY_CARE_PROVIDER_SITE_OTHER): Payer: Self-pay

## 2010-03-11 ENCOUNTER — Ambulatory Visit (HOSPITAL_COMMUNITY)
Admission: RE | Admit: 2010-03-11 | Discharge: 2010-03-11 | Disposition: A | Discharge status: Home / Self Care | Payer: Medicaid Other | Source: Ambulatory Visit | Attending: Internal Medicine | Admitting: Internal Medicine

## 2010-03-11 DIAGNOSIS — Z9889 Other specified postprocedural states: Secondary | ICD-10-CM | POA: Insufficient documentation

## 2010-03-11 DIAGNOSIS — R109 Unspecified abdominal pain: Secondary | ICD-10-CM | POA: Insufficient documentation

## 2010-03-14 ENCOUNTER — Encounter: Payer: Self-pay | Admitting: Internal Medicine

## 2010-03-16 NOTE — Progress Notes (Signed)
Summary: abd sore and swollen  Phone Note Call from Patient Call back at Home Phone [phone]   Caller: Patient Summary of Call: pt called- her stomach is sore and swollen below navel, no N/V, no fever. pt is scheduled for CT this am at 11:45. Informed her I would make RMR aware of whats going on so he would know before CT resuts are in. Initial call taken by: Hendricks Limes LPN,  March 11, 2010 9:23 AM     Appended Document: abd sore and swollen noted

## 2010-03-16 NOTE — Letter (Signed)
Summary: EGD/ED ORDER  EGD/ED ORDER   Imported By: Ave Filter 03/08/2010 11:27:43  _____________________________________________________________________  External Attachment:    Type:   Image     Comment:   External Document

## 2010-03-16 NOTE — Assessment & Plan Note (Signed)
Summary: FOOD LODGING IN HER THROAT/BURNING IN HER THROAT/LAW   Primary Care Provider:  Sudie Bailey, M.D.  CC:  choking and problems with pushing out feces.  History of Present Illness: Pt presents today in follow-up, c/o pain in lower stomach. states wakes up choking at night. fels like has alot of pressure, multiple areas of abdomen. sometimes in RLQ when stands up, then moves across. then sometimes has pain in LLQ that shoots up to navel. started after surgery with Dr. Lovell Sheehan. placed mesh. little sharp pains, like a little pin, like a little cutting feeling. ache. sometimes makes you want to go and lay down. exacerbated by movement, brought on by movement. Reports BM rare, stool is soft.   reports lack of appetite 3-4 month originally, now 1 year of lack of appetite. choking at night started approximately 6 mos ago. +nausea, feels like stomach is heavy. nausea intermittent. +dysphagia with liquids, some foods. denies issues with breads/meats. Epigastric pain intermittently with eating. Takes 2 Aleve every other day.   Current Medications (verified): 1)  Albuterol Neb Tx .... As Needed 2)  Miralax .... As Needed 3)  Flovent .... As Needed 4)  Seroquel 300 Mg Tabs (Quetiapine Fumarate) .... At Bedtime 5)  Diazepam 5 Mg Tabs (Diazepam) .... As Needed 6)  Keppra 750 Mg Tabs (Levetiracetam) .... Three Times A Day 7)  Aciphex 20 Mg Tbec (Rabeprazole Sodium) .... One By Mouth 30 Mins Before Last Meal of Day. 8)  Lyrica 100 Mg Caps (Pregabalin) .... Two Times A Day 9)  Diclofenac Sodium 75 Mg Tbec (Diclofenac Sodium) .... Two Times A Day 10)  Hydrochlorothiazide 25 Mg Tabs (Hydrochlorothiazide) .... Once Daily 11)  Estraderm 0.05 Mg/24hr Pttw (Estradiol) 12)  Furosemide 20 Mg Tabs (Furosemide) .... Once Daily 13)  Nitrostat 0.4 Mg Subl (Nitroglycerin) .... As Needed 14)  Cetirizine Hcl 10 Mg Tabs (Cetirizine Hcl) .... Once Daily  Allergies: 1)  ! * Ivp Dye 2)  ! Codeine 3)  * Latex  Past  History:  Past Medical History: Last updated: 09/16/2009 seizures PTSD panic attacks GERD **MAR 2009 BASW-MILD GERD, o/w NL asthma anxiety schizophrenia Bipolar D/O NAUSEA-NL GES JAN 2009 OCT 2008: EGD-bX: MILD GASTRITIS Constipation, SINCE 1999 **2009: CTAP W/O CM-retained stool, HEME NEGx3 DCBE/Flex sig, 1999, normal to 60cm (RMR)  Past Surgical History: hysterectomy, serous cystadenoma and right adnexal stricture, bilateral retroperitoneal fibrosis gallbladder tonsillectomy incisional hernia repair with mesh 04/2008  Family History: Reviewed history from 04/26/2009 and no changes required. FH of Cancer:  Family History of Diabetes Family History of Arthritis Maternal GF colon cancer, prostate cancer? Mat GM breast cancer? 2 cousins and aunt, crohn's disease Hx, family, asthma  Social History: Reviewed history from 04/26/2009 and no changes required. Patient is married. Stepchildren, stepdgt breast cancer 58 yo.  Lives with husband no children.  Disabilityfor Schizophrenia, Bipolar, Seizures Illiterate 2 ppd smoking: X 26 years  No alcohol, no drugs. caffeine regularly  Review of Systems General:  Denies fever, chills, and anorexia. Eyes:  Denies blurring, irritation, and discharge. ENT:  Denies sore throat, hoarseness, and difficulty swallowing. CV:  Denies chest pains and syncope. Resp:  Denies dyspnea at rest and wheezing. GI:  Complains of difficulty swallowing, nausea, abdominal pain, and constipation; denies pain on swallowing, bloody BM's, and black BMs. GU:  Denies urinary burning and urinary frequency. MS:  Denies joint pain / LOM, joint swelling, and joint stiffness. Derm:  Denies rash, itching, and dry skin. Neuro:  Denies weakness and syncope. Psych:  Denies depression and anxiety. Endo:  Denies cold intolerance and heat intolerance. Heme:  Denies bruising and bleeding.  Vital Signs:  Patient profile:   40 year old female Height:      67  inches Weight:      223 pounds BMI:     35.05 Temp:     98.2 degrees F oral Pulse rate:   84 / minute BP sitting:   112 / 78  (left arm) Cuff size:   regular  Vitals Entered By: Hendricks Limes LPN (March 08, 2010 10:25 AM)  Physical Exam  General:  Well developed, well nourished, no acute distress. Head:  Normocephalic and atraumatic. Eyes:  sclera without icterus Lungs:  Clear throughout to auscultation. Heart:  Regular rate and rhythm; no murmurs, rubs,  or bruits. Abdomen:  +BS, soft, mildly tender to palpation epigastric region. no HSM, no rebound or guarding.  Msk:  Symmetrical with no gross deformities. Normal posture. Pulses:  Normal pulses noted. Extremities:  No clubbing, cyanosis, edema or deformities noted. Neurologic:  Alert and  oriented x4;  grossly normal neurologically. Psych:  Alert and cooperative. Normal mood and affect.  Impression & Recommendations:  Problem # 1:  ABDOMINAL PAIN (ICD-18.37)  40 year old African American female with vague reports of RLQ pain, LLQ pain exacerbated by movement, starting after hernia repair with mesh by Dr. Lovell Sheehan in 2010. May be r/t adhesions/mesh. However, does report lack of appetite for at least 3 months, "choking" at night, +nausea and a heaviness in stomach. +dysphagia with liquids and some foods as well as +epigastric pain intermittently with eating. Does admit to taking 2 Aleve every other day. Last EGD in 2008. Due to NSAID use, likely has gastritis, possible PUD. dysphagia may be r/t uncontrolled reflux and/or web, ring, stricture.   AVOID NSAIDs. Continue Aciphex EGD/ED in OR secondary to polypharmacy with Dr. Jena Gauss in near future: the R/B/A have been discussed in detail, and she has stated understanding.  Orders: Est. Patient Level II (16109)  Problem # 2:  CONSTIPATION (ICD-564.00)  Has been evaluated at [facility] for possible rectocele. No further recommendations per their report. Continue Miralax daily as needed,  incorporate high fiber diet. No changes at this time.   Orders: Est. Patient Level II (60454)  Patient Instructions: 1)  Continue to take Miralax as needed. May take daily if needed. 2)  Supplemental fiber to diet. (Benefiber, Metamucil, etc.) 3)  We will set you up for an upper endoscopy to see why you are having nausea and problems swallowing. 4)  We will see you back in 3 mos.  5)  The medication list was reviewed and reconciled.  All changed / newly prescribed medications were explained.  A complete medication list was provided to the patient / caregiver.   Appended Document: FOOD LODGING IN HER THROAT/BURNING IN HER THROAT/LAW 3 MONTH F/U OPV IS IN THE COMPUTER

## 2010-03-24 NOTE — Letter (Signed)
Summary: Patient Notice, Endo Biopsy Results  [facility]  [address]   [place], [place] [address]   Phone: [phone]  Fax: [phone]       March 14, 2010   MAYLYNN ORZECHOWSKI [address] [place], [place]  [address] 01-16-1971    Dear Ms. Yusupov,  I am pleased to inform you that the biopsies taken during your recent endoscopic examination did not show any evidence of cancer upon pathologic examination.  Additional information/recommendations:  Continue with the treatment plan as outlined on the day of your exam.  Please call us if you are having persistent problems or have questions about your condition that have not been fully answered at this time.  Sincerely,    R. Roetta Sessions MD, FACP [facility] Ph: [phone]   Fax: [phone]   Appended Document: Patient Notice, Endo Biopsy Results letter mailed to pt

## 2010-03-29 NOTE — Op Note (Signed)
  NAME:  Jennifer [name], Jennifer [name]                ACCOUNT NO.:  [id number]  MEDICAL RECORD NO.:  [id number]           PATIENT TYPE:  O  LOCATION:  [facility]                          FACILITY:  [facility]  PHYSICIAN:  R. Roetta Sessions, M.D. DATE OF BIRTH:  06-15-1970  DATE OF PROCEDURE:  03/10/2010 DATE OF DISCHARGE:                              OPERATIVE REPORT   PROCEDURE:  EGD with biopsy.  INDICATIONS FOR PROCEDURE:  A 40 year old lady with bilateral lower quadrant pain exacerbated by moving.  She relates symptoms onset after having hernia repair with mesh by Dr. Lovell Sheehan back in 2010.  She also has loss of appetite and some episodes of "choking and nausea" relates dysphagia to solids.  EGD now being done to further evaluate her upper GI tract symptoms.  I told [name] her lower quadrant abdominal pain may well be related to some other process and further investigation may be needed, if EGD is unrevealing as to the cause of all her symptoms. Risks, benefits, limitations, alternatives, and imponderables have been discussed, questions answered.  Please see the documentation medical record.  PROCEDURE NOTE:  O2 saturation, blood pressure, pulse, and respirations monitored throughout the entire procedure because of polypharmacy is being done under propofol with help of Dr. Jayme Cloud and associates.  INSTRUMENT:  Pentax video chip system.  FINDINGS:  Examination of tubular esophagus revealed no mucosal abnormalities.  EG junction easily traversed.  Stomach:  Gastric cavity was emptied and insufflated well with air.  Thorough examination of gastric mucosa including retroflexion to the proximal stomach and esophagogastric junction demonstrated small hiatal hernia and some "verrucous-appearing mucosa" in the antral prepyloric area.  This did not appear to be infiltrating process.  Gastric mucosa otherwise appeared entirely normal.  Pylorus is patent, easily traversed. Examination of the bulb of the second  portion revealed no abnormalities. Therapeutic/diagnostic maneuvers performed.  The scope was withdrawn.  A 56-French Maloney dilators were passed in full insertion.  A look back revealed no apparent complications related to passage of the dilator. Subsequently, biopsied the antrum and taken for histologic study.  The patient tolerated the procedure well, and was taken to PACU in stable condition.  IMPRESSION: 1. Normal-appearing esophagus status post passage of a 56-French     Maloney dilator. 2. Moderate size hiatal hernia. 3. Verrucous changes of the antral mucosal of uncertain significance     status post biopsy, patent pylorus normal D1-D2.  RECOMMENDATIONS: 1. Continue Aciphex 20 mg orally daily for now. 2. We will proceed with abdomen and pelvic CT scan to further evaluate     her symptoms. 3. If CT is unrevealing as a cause for her pain, we likely recommend     she go back to see Dr. Lovell Sheehan.  Further recommendations to follow.     Jonathon Bellows, M.D.     RMR/MEDQ  D:  03/11/2010  T:  03/11/2010  Job:  [id number]  Electronically Signed by Lorrin Goodell M.D. on 03/29/2010 02:31:58 PM

## 2010-05-10 ENCOUNTER — Encounter: Payer: Self-pay | Admitting: Gastroenterology

## 2010-05-11 ENCOUNTER — Ambulatory Visit: Payer: Self-pay | Admitting: Gastroenterology

## 2010-05-13 LAB — BASIC METABOLIC PANEL
BUN: 7 mg/dL (ref 6–23)
Chloride: 110 mEq/L (ref 96–112)
Glucose, Bld: 93 mg/dL (ref 70–99)
Potassium: 3.8 mEq/L (ref 3.5–5.1)

## 2010-05-15 LAB — DIFFERENTIAL
Basophils Absolute: 0.1 10*3/uL (ref 0.0–0.1)
Eosinophils Relative: 1 % (ref 0–5)
Lymphocytes Relative: 35 % (ref 12–46)
Monocytes Relative: 2 % — ABNORMAL LOW (ref 3–12)
Neutrophils Relative %: 61 % (ref 43–77)

## 2010-05-15 LAB — CBC
HCT: 36.4 % (ref 36.0–46.0)
MCHC: 33.1 g/dL (ref 30.0–36.0)
MCV: 73.7 fL — ABNORMAL LOW (ref 78.0–100.0)
Platelets: 225 10*3/uL (ref 150–400)
RDW: 15.8 % — ABNORMAL HIGH (ref 11.5–15.5)

## 2010-05-15 LAB — RAPID URINE DRUG SCREEN, HOSP PERFORMED
Amphetamines: NOT DETECTED
Barbiturates: NOT DETECTED
Benzodiazepines: POSITIVE — AB

## 2010-05-15 LAB — COMPREHENSIVE METABOLIC PANEL
Albumin: 4 g/dL (ref 3.5–5.2)
BUN: 13 mg/dL (ref 6–23)
Calcium: 9.5 mg/dL (ref 8.4–10.5)
Creatinine, Ser: 1.07 mg/dL (ref 0.4–1.2)
Potassium: 4.5 mEq/L (ref 3.5–5.1)
Total Protein: 7.6 g/dL (ref 6.0–8.3)

## 2010-05-15 LAB — GLUCOSE, CAPILLARY: Glucose-Capillary: 98 mg/dL (ref 70–99)

## 2010-05-15 LAB — URINALYSIS, ROUTINE W REFLEX MICROSCOPIC
Hgb urine dipstick: NEGATIVE
Protein, ur: NEGATIVE mg/dL
Urobilinogen, UA: 0.2 mg/dL (ref 0.0–1.0)

## 2010-05-15 LAB — ETHANOL: Alcohol, Ethyl (B): <5 mg/dL (ref 0–10)

## 2010-05-18 ENCOUNTER — Encounter: Payer: Self-pay | Admitting: Gastroenterology

## 2010-05-18 ENCOUNTER — Ambulatory Visit (INDEPENDENT_AMBULATORY_CARE_PROVIDER_SITE_OTHER): Payer: Medicaid Other | Admitting: Gastroenterology

## 2010-05-18 DIAGNOSIS — R1310 Dysphagia, unspecified: Secondary | ICD-10-CM

## 2010-05-18 DIAGNOSIS — N898 Other specified noninflammatory disorders of vagina: Secondary | ICD-10-CM

## 2010-05-18 DIAGNOSIS — K5900 Constipation, unspecified: Secondary | ICD-10-CM

## 2010-05-18 DIAGNOSIS — K219 Gastro-esophageal reflux disease without esophagitis: Secondary | ICD-10-CM

## 2010-05-18 DIAGNOSIS — R109 Unspecified abdominal pain: Secondary | ICD-10-CM

## 2010-05-18 MED ORDER — LUBIPROSTONE 8 MCG PO CAPS: 8.0000 ug | ORAL_CAPSULE | Freq: Two times a day (BID) | ORAL | Status: DC

## 2010-05-18 MED ORDER — DEXLANSOPRAZOLE 60 MG PO CPDR: 60.0000 mg | DELAYED_RELEASE_CAPSULE | Freq: Every day | ORAL | Status: DC

## 2010-05-18 NOTE — Patient Instructions (Signed)
CHANGES TO MEDICATIONS: 1. Stop Omeprazole. Begin Dexilant samples. Take 1 by mouth daily each morning. You have been given #10 samples. If this works, activate the savings card and call our office for a prescription.  2. Stop Bentyl (dicyclomine). This may be making your constipation worse. You have been given samples of amitiza. Start this evening by taking one pill, WITH FOOD. Tomorrow and Friday take one pill in the evening as well. If you do well with this, increase to the morning and evening on Saturday. Call our office if this helps with constipation. We will call in a prescription for you if it does. AVOID pregnancy with this medication.  3. You have been set up for an xray that will look at how you swallow.   4. Follow-up with your gynecologist so you may be assessed for the issues you are having.  5. Continue the high fiber diet.  6. Return in 4 weeks.

## 2010-05-19 ENCOUNTER — Encounter: Payer: Self-pay | Admitting: Gastroenterology

## 2010-05-19 ENCOUNTER — Other Ambulatory Visit: Payer: Self-pay | Admitting: Internal Medicine

## 2010-05-19 LAB — BASIC METABOLIC PANEL
CO2: 24 mEq/L (ref 19–32)
Calcium: 9.3 mg/dL (ref 8.4–10.5)
Chloride: 106 mEq/L (ref 96–112)
Glucose, Bld: 96 mg/dL (ref 70–99)
Sodium: 136 mEq/L (ref 135–145)

## 2010-05-19 LAB — CBC
Hemoglobin: 12 g/dL (ref 12.0–15.0)
MCHC: 32.9 g/dL (ref 30.0–36.0)
MCV: 73.6 fL — ABNORMAL LOW (ref 78.0–100.0)
RBC: 4.96 MIL/uL (ref 3.87–5.11)
RDW: 15.6 % — ABNORMAL HIGH (ref 11.5–15.5)

## 2010-05-19 NOTE — Assessment & Plan Note (Addendum)
Chronic constipation with thorough work-up in past. Stop bentyl; may be contributing to constipation. Will trial amitiza 8 mcg daily for three days, then BID. Pt to call if improvement. Needs to continue to incorporate high fiber diet.

## 2010-05-19 NOTE — Progress Notes (Signed)
Referring Provider: Toma Deiters, MD Primary Care Physician:  Toma Deiters, MD Gastroenterologist: Dr. Darrick Penna  Chief Complaint  Patient presents with  . Follow-up  . Abdominal Pain    HPI:   Jennifer [name] is a 40 year old female with h/o chronic abdominal pain, chronic constipation, who presents in f/u after EGD performed in Feb 2012 secondary to nausea and dysphagia. EGD was essentially normal with benign biopsies, s/p 56-F Maloney dilator. CT abd/pelvis was also performed due to continued pain without significant findings.  Pt continues to c/o dysphagia, choking at night, only slight relief after EGD. Feels like she has difficulty with saliva. Reports belching and "lots of acid". Has been on Aciphex in the past, which helped tremendously with reflux. Due to insurance issues, she is now on omeprazole. She is not finding symptom relief with this. Has also tried Nexium in the past. Continues to complain of lower abdominal pain with and after intercourse, reports yellow discharge from vagina. Continues to complain of constipation. Was placed on Bentyl by another physician. When asked where pain is located, she points specifically to the RUQ, LUQ, and RLQ.  She is down 6 lbs from visit in Jan.   Past Medical History  Diagnosis Date  . Seizures   . PTSD (post-traumatic stress disorder)   . GERD (gastroesophageal reflux disease)   . Panic attack   . Asthma   . Anxiety   . Schizophrenia   . Bipolar 1 disorder   . Constipation 1999  . Nausea     nl GES 2009  . S/P endoscopy Feb 2012    nl, s/p 56-French [facility] dilator, biopsy benign  . S/P endoscopy 2008    mild gastritis  . History of flexible sigmoidoscopy 1999    normal to 60cm,    Past Surgical History  Procedure Date  . Partial hysterectomy   . Cholecystectomy   . Tonsillectomy   . Hernia repair     incisional with mesh    Current Outpatient Prescriptions  Medication Sig Dispense Refill  . albuterol (ACCUNEB) 0.63  MG/3ML nebulizer solution Take 1 ampule by nebulization every 6 (six) hours as needed.        . cetirizine (ZYRTEC) 10 MG chewable tablet Chew 10 mg by mouth daily.        . diazepam (VALIUM) 5 MG tablet Take 5 mg by mouth every 6 (six) hours as needed.        Marland Kitchen estradiol (VIVELLE-DOT) 0.05 MG/24HR Place 1 patch onto the skin once a week.        . fluticasone (FLOVENT HFA) 110 MCG/ACT inhaler Inhale 1 puff into the lungs 2 (two) times daily.        . furosemide (LASIX) 20 MG tablet Take 20 mg by mouth daily.        Marland Kitchen levETIRAcetam (KEPPRA) 750 MG tablet Take 750 mg by mouth 3 (three) times daily.        . nitroGLYCERIN (NITROSTAT) 0.4 MG SL tablet Place 0.4 mg under the tongue every 5 (five) minutes as needed.        Marland Kitchen omeprazole (PRILOSEC) 20 MG capsule Take 20 mg by mouth daily.        . polyethylene glycol (MIRALAX / GLYCOLAX) packet Take 17 g by mouth daily.        . QUEtiapine (SEROQUEL) 300 MG tablet Take 300 mg by mouth at bedtime.        Marland Kitchen dexlansoprazole (DEXILANT) 60 MG capsule Take 1 capsule (  60 mg total) by mouth daily.  10 capsule  0  . diclofenac (VOLTAREN) 75 MG EC tablet Take 75 mg by mouth 2 (two) times daily.        . hydrochlorothiazide 25 MG tablet Take 25 mg by mouth daily.        Marland Kitchen lubiprostone (AMITIZA) 8 MCG capsule Take 1 capsule (8 mcg total) by mouth 2 (two) times daily with a meal.  20 capsule  0  . pregabalin (LYRICA) 100 MG capsule Take 100 mg by mouth 2 (two) times daily.        . RABEprazole (ACIPHEX) 20 MG tablet Take 20 mg by mouth daily.          Allergies as of 05/18/2010 - Review Complete 05/18/2010  Allergen Reaction Noted  . Codeine    . Latex  03/08/2010    Family History  Problem Relation Age of Onset  . Crohn's disease Maternal Aunt   . Breast cancer Maternal Grandmother   . Colon cancer Maternal Grandfather   . Pancreatic cancer Maternal Grandfather   . Crohn's disease Cousin     x 2 cousins  . Breast cancer Daughter 3    History    Social History  . Marital Status: Married    Spouse Name: N/A    Number of Children: N/A  . Years of Education: N/A   Social History Main Topics  . Smoking status: Current Some Day Smoker -- 1.0 packs/day for 26 years     Review of Systems: Gen: Denies fever, chills, anorexia. Denies fatigue, weakness, weight loss.  CV: Denies chest pain, palpitations, syncope, peripheral edema, and claudication. Resp: Denies dyspnea at rest, cough, wheezing, coughing up blood, and pleurisy. GI: See HPI Derm: Denies rash, itching, dry skin Psych: Denies depression, anxiety, memory loss, confusion. No homicidal or suicidal ideation.  Heme: Denies bruising, bleeding, and enlarged lymph nodes.  Physical Exam: BP 107/72  Pulse 76  Temp(Src) 97.4 F (36.3 C) (Tympanic)  Ht 5\' 6"  (1.676 m)  Wt 217 lb (98.431 kg)  BMI 35.02 kg/m2 General:   Alert and oriented. No distress noted. Somewhat flat affect. Pleasant.  Head:  Normocephalic and atraumatic. Eyes:  Conjuctiva clear without scleral icterus. Mouth:  Oral mucosa pink and moist. Good dentition. No lesions. Neck:  Supple, without mass or thyromegaly. Heart:  S1, S2 present without murmurs, rubs, or gallops. Regular rate and rhythm. Abdomen:  +BS, soft, non-tender and non-distended. No rebound or guarding. No HSM or masses noted. Msk:  Symmetrical without gross deformities. Normal posture. Extremities:  Without edema. Neurologic:  Alert and  oriented x4;  grossly normal neurologically. Skin:  Intact without significant lesions or rashes. Cervical Nodes:  No significant cervical adenopathy. Psych:  Alert and cooperative. Normal mood and affect.

## 2010-05-19 NOTE — Assessment & Plan Note (Addendum)
Chronic abdominal pain, s/p EGD with no significant findings as well as CT with no etiology. Chronic constipation may be significantly contributing to abdominal discomfort, question adhesive disease vs functional abdominal pain. Will attempt to establish good bowel regimen. See constipation plan. Also, ordered CMP, TSH, lipase

## 2010-05-19 NOTE — Assessment & Plan Note (Signed)
Lower abdominal pain during and after sexual intercourse; also complains of increased amount of yellowish discharge. Strongly counseled to seek GYN care. Pt has appt May 31st. Asked to schedule sooner.

## 2010-05-19 NOTE — Assessment & Plan Note (Signed)
Was well-controlled with Aciphex; now no longer able to take secondary to insurance. Has tried prilosec and nexium without adequate relief. Will try Dexilant samples. If improvement, contact our office for full  Rx.

## 2010-05-19 NOTE — Assessment & Plan Note (Signed)
40 year old female with symptoms of dysphagia X 6 mos, only slight improvement after EGD with dilation in Feb 2012. Continues to c/o choking at night, difficulty with saliva. Question anxiety component with symptoms. Will assess with UGI/BPE. Further rec's to follow.

## 2010-05-20 LAB — COMPREHENSIVE METABOLIC PANEL
AST: 13 U/L (ref 0–37)
Albumin: 4.3 g/dL (ref 3.5–5.2)
Alkaline Phosphatase: 75 U/L (ref 39–117)
BUN: 17 mg/dL (ref 6–23)
Potassium: 4.8 mEq/L (ref 3.5–5.3)
Sodium: 143 mEq/L (ref 135–145)
Total Bilirubin: 0.3 mg/dL (ref 0.3–1.2)
Total Protein: 7.6 g/dL (ref 6.0–8.3)

## 2010-05-23 ENCOUNTER — Other Ambulatory Visit: Payer: Self-pay | Admitting: Gastroenterology

## 2010-05-23 ENCOUNTER — Ambulatory Visit (HOSPITAL_COMMUNITY)
Admission: RE | Admit: 2010-05-23 | Discharge: 2010-05-23 | Disposition: A | Discharge status: Home / Self Care | Payer: Medicaid Other | Source: Ambulatory Visit | Attending: Internal Medicine | Admitting: Internal Medicine

## 2010-05-23 DIAGNOSIS — K449 Diaphragmatic hernia without obstruction or gangrene: Secondary | ICD-10-CM | POA: Insufficient documentation

## 2010-05-23 DIAGNOSIS — R1310 Dysphagia, unspecified: Secondary | ICD-10-CM | POA: Insufficient documentation

## 2010-05-23 DIAGNOSIS — K5900 Constipation, unspecified: Secondary | ICD-10-CM

## 2010-05-23 DIAGNOSIS — K219 Gastro-esophageal reflux disease without esophagitis: Secondary | ICD-10-CM | POA: Insufficient documentation

## 2010-05-23 MED ORDER — LUBIPROSTONE 8 MCG PO CAPS: 8.0000 ug | ORAL_CAPSULE | Freq: Two times a day (BID) | ORAL | Status: AC

## 2010-06-16 ENCOUNTER — Ambulatory Visit (INDEPENDENT_AMBULATORY_CARE_PROVIDER_SITE_OTHER): Payer: Medicaid Other | Admitting: Gastroenterology

## 2010-06-16 ENCOUNTER — Encounter: Payer: Self-pay | Admitting: Gastroenterology

## 2010-06-16 DIAGNOSIS — R109 Unspecified abdominal pain: Secondary | ICD-10-CM

## 2010-06-16 DIAGNOSIS — R1310 Dysphagia, unspecified: Secondary | ICD-10-CM

## 2010-06-16 DIAGNOSIS — K219 Gastro-esophageal reflux disease without esophagitis: Secondary | ICD-10-CM

## 2010-06-16 DIAGNOSIS — K5900 Constipation, unspecified: Secondary | ICD-10-CM

## 2010-06-16 NOTE — Patient Instructions (Addendum)
Continue Amitiza 8 mcg twice a day. TAKE with food.  Continue Dexilant daily.  Follow-up with your primary are doctor regarding your heart speeding up. If you experience any chest pain, shortness of breath, feeling weak or clammy, seek medical attention.  We have given you something to test your stool. Please wait until after the ultrasound is completed to perform this. When you have collected the sample, you may return it to our office.   We will call you with the results, and we will let you know if we need to set up a colonoscopy.  Otherwise, we will see you back in 3 months.

## 2010-06-16 NOTE — Assessment & Plan Note (Signed)
Chronic abdominal pain.  RLQ "sharp, pin prick", lasting a few seconds. CT without etiology. Constipation improved. As this is a chronic problem, likely is functional abdominal pain. Pt is set up for pelvic US with Dr. Ralph Dowdy the end of May. Will collect an ifobt for any occult blood. If possible, proceed with colonoscopy. Pt does have FH of colon cancer (grandmother). Doubt this is a GI issue, but will check ifobt for completeness sake. Pt to return after [place] is completed.   Return in 3 mos.

## 2010-06-16 NOTE — Assessment & Plan Note (Signed)
Resolved. Continue Dexilant.  

## 2010-06-16 NOTE — Assessment & Plan Note (Signed)
On Amitiza 8 mcg BID. Feels significant improvement with this. Denies bloating. Still has somewhat rare BMs, which will probably be the norm for pt. However, feels BMs are productive and "easier". Continue Amitiza.

## 2010-06-16 NOTE — Progress Notes (Signed)
Cc to PCP 

## 2010-06-16 NOTE — Progress Notes (Signed)
Referring Provider: Toma Deiters, MD Primary Care Physician:  Toma Deiters, MD Primary Gastroenterologist: Dr. Darrick Penna   Chief Complaint  Patient presents with  . Follow-up    HPI:   Ms. Dimaano presents today in follow-up for chronic constipation, abdominal pain, GERD, and dysphagia. In the interim from our last appt, she has undergone an UGI/BPE: this showed no impediment to pill passage, no dysmotility, +reflux. She reports improvement with GERD on Dexilant. No nocturnal reflux. No dysphagia. Started Amitiza 8 mcg BID since last visit. Feels this has significantly improved her constipation. Denies bloating. Still reports BMs several days apart, but feels it is now more productive and "easier".   Does complain of RLQ discomfort, worsened with intercourse. States it feels like a "pin pricking". It is a quick sensation, then resolves. She has noted no melena or brbpr. a little pin pricking. Quick, then done.   Set up for pelvic US and transabdominal US end of May with Dr. Ralph Dowdy.   She wants to know when she should have a colonoscopy. Also states that she feels her heart races when she is walking from her bedroom to the living room. No shortness of breath. No chest pain.     Past Medical History  Diagnosis Date  . Seizures   . PTSD (post-traumatic stress disorder)   . GERD (gastroesophageal reflux disease)   . Panic attack   . Asthma   . Anxiety   . Schizophrenia   . Bipolar 1 disorder   . Constipation 1999  . Nausea     nl GES 2009  . S/P endoscopy Feb 2012    nl, s/p [facility] dilator, biopsy benign  . S/P endoscopy 2008    mild gastritis  . History of flexible sigmoidoscopy 1999    normal to 60cm,    Past Surgical History  Procedure Date  . Partial hysterectomy   . Cholecystectomy   . Tonsillectomy   . Hernia repair     incisional with mesh    Current Outpatient Prescriptions  Medication Sig Dispense Refill  . albuterol (ACCUNEB) 0.63 MG/3ML  nebulizer solution Take 1 ampule by nebulization every 6 (six) hours as needed.        . cetirizine (ZYRTEC) 10 MG chewable tablet Chew 10 mg by mouth daily.        Marland Kitchen dexlansoprazole (DEXILANT) 60 MG capsule Take 1 capsule (60 mg total) by mouth daily.  10 capsule  0  . diazepam (VALIUM) 5 MG tablet Take 5 mg by mouth every 6 (six) hours as needed.        . fluticasone (FLOVENT HFA) 110 MCG/ACT inhaler Inhale 1 puff into the lungs 2 (two) times daily.        . furosemide (LASIX) 20 MG tablet Take 20 mg by mouth daily.        Marland Kitchen levETIRAcetam (KEPPRA) 750 MG tablet Take 750 mg by mouth 3 (three) times daily.        Marland Kitchen lubiprostone (AMITIZA) 8 MCG capsule Take 1 capsule (8 mcg total) by mouth 2 (two) times daily with a meal.  2 capsule  3  . nitroGLYCERIN (NITROSTAT) 0.4 MG SL tablet Place 0.4 mg under the tongue every 5 (five) minutes as needed.        Marland Kitchen omeprazole (PRILOSEC) 20 MG capsule Take 20 mg by mouth daily.        . polyethylene glycol (MIRALAX / GLYCOLAX) packet Take 17 g by mouth daily.        Marland Kitchen  QUEtiapine (SEROQUEL) 300 MG tablet Take 300 mg by mouth at bedtime.        . diclofenac (VOLTAREN) 75 MG EC tablet Take 75 mg by mouth 2 (two) times daily.        Marland Kitchen estradiol (VIVELLE-DOT) 0.05 MG/24HR Place 1 patch onto the skin once a week.        . hydrochlorothiazide 25 MG tablet Take 25 mg by mouth daily.        . pregabalin (LYRICA) 100 MG capsule Take 100 mg by mouth 2 (two) times daily.        . RABEprazole (ACIPHEX) 20 MG tablet Take 20 mg by mouth daily.          Allergies as of 06/16/2010 - Review Complete 06/16/2010  Allergen Reaction Noted  . Codeine    . Latex  03/08/2010    Family History  Problem Relation Age of Onset  . Crohn's disease Maternal Aunt   . Breast cancer Maternal Grandmother   . Colon cancer Maternal Grandfather   . Pancreatic cancer Maternal Grandfather   . Crohn's disease Cousin     x 2 cousins  . Breast cancer Daughter 44    History   Social  History  . Marital Status: Married    Spouse Name: N/A    Number of Children: N/A  . Years of Education: N/A   Social History Main Topics  . Smoking status: Current Some Day Smoker -- 1.0 packs/day for 26 years  . Smokeless tobacco: None  . Alcohol Use: No  . Drug Use: No  . Sexually Active: None   Review of Systems: Gen: Denies fever, chills, anorexia. Denies fatigue, weakness, weight loss.  CV: Denies chest pain, palpitations, syncope, peripheral edema, and claudication. Resp: Denies dyspnea at rest, cough, wheezing, coughing up blood, and pleurisy. GI: Denies vomiting blood, jaundice, and fecal incontinence.   Denies dysphagia or odynophagia. Derm: Denies rash, itching, dry skin Psych: Denies depression, anxiety, memory loss, confusion. No homicidal or suicidal ideation.  Heme: Denies bruising, bleeding, and enlarged lymph nodes.  Physical Exam: BP 119/78  Pulse 68  Temp(Src) 97.8 F (36.6 C) (Tympanic)  Ht 5\' 6"  (1.676 m)  Wt 220 lb 6.4 oz (99.973 kg)  BMI 35.57 kg/m2 General:   Alert and oriented. No distress noted. Pleasant and cooperative.  Head:  Normocephalic and atraumatic. Eyes:  Conjuctiva clear without scleral icterus. Mouth:  Oral mucosa pink and moist. Good dentition. No lesions. Heart:  S1, S2 present without murmurs, rubs, or gallops. Regular rate and rhythm. Lungs: Clear to auscultation bilaterally Abdomen:  +BS, soft, non-tender and non-distended. No rebound or guarding. No HSM or masses noted. Msk:  Symmetrical without gross deformities. Normal posture. Extremities:  Without edema. Neurologic:  Alert and  oriented x4;  grossly normal neurologically. Skin:  Intact without significant lesions or rashes. Psych:  Alert and cooperative. Normal mood and affect.

## 2010-06-16 NOTE — Assessment & Plan Note (Signed)
Significantly improved on Dexilant. Denies nocturnal reflux. Denies dysphagia. Continue Dexilant, continue efforts of weight loss.

## 2010-06-21 NOTE — Procedures (Signed)
NAME:  Jennifer [name], Jennifer [name]                ACCOUNT NO.:  [id number]   MEDICAL RECORD NO.:  [id number]          PATIENT TYPE:  OUT   LOCATION:  SLEE                          FACILITY:  [facility]   PHYSICIAN:  Kofi A. Gerilyn Pilgrim, M.D. DATE OF BIRTH:  21-Jul-1970   DATE OF PROCEDURE:  06/17/2008  DATE OF DISCHARGE:  06/17/2008                             SLEEP DISORDER REPORT   NOCTURNAL POLYSOMNOGRAPHY REPORT   REFERRING PHYSICIAN:  Kofi A. Gerilyn Pilgrim, M.D.   INDICATION:  This is a 40 year old lady who presents with hypersomnia,  fatigue, and snoring.  She has been evaluated for obstructive sleep  apnea syndrome.   MEDICATIONS:  Keppra, omeprazole, estradiol, Abilify, trazodone,  diazepam, Ventolin, and albuterol.   Epworth sleepiness scale 12.  BMI 33.   ARCHITECTURAL SUMMARY:  This is a diagnostic polysomnography carried out  for 444 minutes.  The sleep efficiency is 93%, sleep latency 30 minutes,  REM latency 81 minutes.  Stage N1 of 6%, N2 of 69%, N3 of 4%, and REM  sleep 21%.   RESPIRATORY SUMMARY:  The baseline oxygen saturation is 95% with lowest  saturation 88%.  The AHI is 1.   LIMB MOVEMENT SUMMARY:  The PLM index is 2.3.   ELECTROCARDIOGRAM SUMMARY:  Average heart rate is 76 with no significant  dysrhythmias observed.   IMPRESSION:  Unremarkable nocturnal polysomnography.      Kofi A. Gerilyn Pilgrim, M.D.  Electronically Signed     KAD/MEDQ  D:  06/23/2008  T:  06/24/2008  Job:  [id number]

## 2010-06-21 NOTE — H&P (Signed)
NAME:  Jennifer [name], Jennifer [name] NO.:  [id number]   MEDICAL RECORD NO.:  [id number]          PATIENT TYPE:  AMB   LOCATION:  DAY                           FACILITY:  [facility]   PHYSICIAN:  Dalia Heading, M.D.  DATE OF BIRTH:  Jul 16, 1970   DATE OF ADMISSION:  DATE OF DISCHARGE:  LH                              HISTORY & PHYSICAL   CHIEF COMPLAINT:  Incisional pain, incisional hernia.   HISTORY OF PRESENT ILLNESS:  The patient is a 40 year old black female  who is referred for evaluation and treatment of lower abdominal pain.  She has been having some pain for many months now, made worse with  straining or sitting upright.  It seems to be along a low Pfannenstiel  incision from previous gynecologic surgery.  This was done in 2005.  No  lump has been noted by the patient.  She states that intercourse is  sometimes painful.  She has been evaluated by Dr. Christin Bach of  Gynecology and CT scan of the abdomen and pelvis were unremarkable.   PAST MEDICAL HISTORY:  Anxiety.   PAST SURGICAL HISTORY:  Hysterectomy, oophorectomy.   CURRENT MEDICATIONS:  Omeprazole, trazodone, diazepam.   ALLERGIES:  IVP DYE.   REVIEW OF SYSTEMS:  The patient smokes a pack of cigarettes a day.  She  denies alcohol use.  She denies any other cardiopulmonary difficulties  or bleeding disorders.   PHYSICAL EXAMINATION:  GENERAL:  The patient is a well-developed, well-  nourished black female who is anxious, but in no acute distress.  LUNGS:  Clear to auscultation with equal breath sounds bilaterally.  HEART:  Regular rate and rhythm without S3, S4, murmurs.  ABDOMEN:  Soft and nondistended.  She has tender along a low transverse  surgical scar, left side greater than right side.  A small possible  incisional hernia is present along the left lateral aspect of the  surgical scar.   IMPRESSION:  Incisional pain, incisional hernia.   PLAN:  The patient is scheduled for an incisional herniorrhaphy  with  possible mesh on May 06, 2008.  The risks and benefits of the  procedure including bleeding, infection, and possibly recurrence of her  hernia, possibly recurrence of her pain were fully explained to the  patient, gave informed consent.  She does not want pelvic exploration at  this time.       Dalia Heading, M.D.  Electronically Signed     MAJ/MEDQ  D:  04/30/2008  T:  05/01/2008  Job:  [id number]   cc:   Tilda Burrow, M.D.  Fax: [phone]   Mila Homer. Sudie Bailey, M.D.  Fax: [phone]   Short Stay at [facility]

## 2010-06-21 NOTE — Op Note (Signed)
NAME:  Jennifer [name], Jennifer [name] NO.:  [id number]   MEDICAL RECORD NO.:  [id number]          PATIENT TYPE:  AMB   LOCATION:  DAY                           FACILITY:  [facility]   PHYSICIAN:  Dalia Heading, M.D.  DATE OF BIRTH:  [birth year]   DATE OF PROCEDURE:  05/06/2008  DATE OF DISCHARGE:                               OPERATIVE REPORT   PREOPERATIVE DIAGNOSIS:  Incisional hernia.   POSTOPERATIVE DIAGNOSIS:  Incisional hernia.   PROCEDURE:  Incisional herniorrhaphy with mesh.   SURGEON:  Dalia Heading, MD   ANESTHESIA:  General endotracheal.   INDICATIONS:  The patient is a 40 year old black female who had  undergone previous pelvic surgery through a Pfannenstiel incision.  She  has been having ongoing pain along the incision line, but in particular  along the left lateral aspect where there seems to be a hernia.  The  risks and benefits of the procedure including bleeding, infection, pain,  the possibility of recurrence of the hernia were fully explained to the  patient, gave informed consent.   PROCEDURE NOTE:  The patient was placed in the supine position.  After  induction of general endotracheal anesthesia, the lower abdomen was  prepped and draped using the usual sterile technique with Betadine.  Surgical site confirmation was performed.   An incision was made through the previous Pfannenstiel incision scar.  This was taken down to the fascia.  The fascia was noted to be  significantly attenuated along the left lateral aspect.  The fascia was  opened transversely through the previous surgical incision.  Excess scar  tissue was excised.  On inspection of the abdominal wall, properitoneal  fat was noted to be protruding in the left lateral aspect of the  operative field.  The patient did not have an inguinal hernia.  The  defect was lateral to the rectus muscle and appeared that the external  oblique and internal oblique aponeuroses were somewhat absent in this  region.  A single piece of onlay polypropylene mesh was then set in this  region.  No sutures were used, as I felt this would cause her increased  pain.  The mesh did stay in place without difficulty.  The mesh was  approximately 5 x 7 cm in size.  They extended over the left rectus  muscle.  It did extend down to the inguinal ligament.  The fascia was  then reapproximated using a running 0 PDS suture.  Subcutaneous layer  was reapproximated using a 2-0 Vicryl interrupted suture.  The skin was  closed using staples.  A 0.5-cm Sensorcaine was instilled in the  surrounding wound.  Betadine ointment and dry sterile dressing were  applied.   All tape and needle counts correct at the end of the procedure.  The  patient was extubated in the operating room and went back to recovery  room awake in stable condition.   COMPLICATIONS:  None.   SPECIMEN:  None.   ESTIMATED BLOOD LOSS:  Minimal.      Dalia Heading, M.D.  Electronically Signed  MAJ/MEDQ  D:  05/06/2008  T:  05/06/2008  Job:  [id number]   cc:   Tilda Burrow, M.D.  Fax: [phone]   Mila Homer. Sudie Bailey, M.D.  Fax: [phone]

## 2010-06-21 NOTE — Assessment & Plan Note (Signed)
NAME:  Jennifer [name], Jennifer [name]                 CHART#:  [id number]   DATE:  01/09/2007                       DOB:  07-22-1970   CHIEF COMPLAINT:  Followup procedure, reflux, and difficulty swallowing.   SUBJECTIVE:  The patient is here for a followup. She recently had an EGD  by Dr. Cira Servant on November 27, 2006. This was done for a several year  history of GERD and chest pain, difficulty swallowing, especially  related to carbonated beverages. She had patchy erythema of the antrum.  Biopsies were negative for H. Pylori or eosinophilic gastritis. She had  mild chronic gastritis, possibly related to NSAID or alcohol use, based  on biopsies. She states that she followed a diet to avoid gastric  irritants, as recommended by Dr. Cira Servant for a while and was doing better.  She has bee on Nexium 40 mg daily as prescribed, although missed a  couple of doses this weekend, when she moved. She states that she was  doing better and now, she has had a recurrence of her globus feeling  that something is in her throat when she eats or drinks. She has had  some hoarseness and dry mouth as well. She states she is constantly  drinking water and gets up throughout the night and drinks water and has  nocturia because of it. She states she previously tried Prilosec and it  did not help with her symptoms. She has never been on any other the  other PPIs. She complains of pelvic pain, which occurred recently after  she used a douche. She is going to see Dr. Ralph Dowdy . She gives a history  of serous cystadenoma in the past and wonders if she is having any  complications although she tells me that she has had a complete  hysterectomy. She complains of some early satiety. She has epigastric,  left upper quadrant, and abdominal pain. She states she has epigastric  pain when she lays down and when she sits up, it is in the left upper  quadrant. Her bowel movements are regular. Really no issues there.   CURRENT MEDICATIONS:  See  updated list.   ALLERGIES:  IVP DYE.   PHYSICAL EXAMINATION:  VITAL SIGNS:  Weight 182, which is stable.  Temperature 98.3, blood pressure 120/80, pulse 80.  GENERAL:  Pleasant African-American female. No acute distress.  SKIN:  Warm and dry. No jaundice.  HEENT:  Sclerae nonicteric. Oropharyngeal mucosa moist and pink.  ABDOMEN:  Positive bowel sounds. Soft. She has moderate tenderness in  the left lower quadrant region on examination. No rebound tenderness or  guarding. No abdominal bruits or hernias.  EXTREMITIES:  No edema.   IMPRESSION:  Ms. Klugh is a 40 year old lady with some typical  gastroesophageal reflux disease symptoms as well as possible LPR type  symptoms. Symptoms improved temporarily on Nexium 40 mg daily but she is  having recurrence of her LRP type symptoms including globus, hoarseness,  etc. She seems to have difficulty swallowing carbonated beverages. Other  foods go down just fine. She complains today of abdominal pain and  pointing to the epigastrium and left upper quadrant region. On  examination, she has left lower quadrant tenderness. Due to her  psychiatric illness, we are having a somewhat difficult time obtaining a  consistent history with her.   PLAN:  1. C-met and CBC.  2. CT of the abdomen and pelvis with IV and oral contrast.  3. Increase her Nexium to 40 mg bid #30, samples provided to      supplement her prescription.  4. Gastroesophageal reflux handout provided to the patient.   ADDENDUM:  Patient should avoid carbonated beverages.       Tana Coast, P.A.  Electronically Signed     Kassie Mends, M.D.  Electronically Signed    LL/MEDQ  D:  01/09/2007  T:  01/09/2007  Job:  [id number]   cc:   Tesfaye D. Felecia Shelling, MD

## 2010-06-21 NOTE — Assessment & Plan Note (Signed)
NAME:  Jennifer [name], Jennifer [name]                 CHART#:  [id number]   DATE:  11/19/2007                       DOB:  July 26, 1970   CHIEF COMPLAINT:  Abdominal pain and nausea.   PROBLEM LIST:  1. Chronic GERD with history of LPR-type symptoms.  2. EGD on November 27, 2006, revealed patchy erythema of the antrum.      Biopsies were negative for H. pylori or eosinophilic gastritis.  3. Constipation.  4. Anemia with hemoglobin of 11.6, MCV 72.8 in January 2009.  5. Seizure disorder.  6. Insomnia.  7. CT of the abdomen and pelvis in December 2008, 3-cm high-density      cystic/solid mass along the inferior margin of the right hepatic      lobe and high-density structures along the inferior margin of the      liver likely representing dropped gallstones or clips.  8. MRI of the abdomen in December 2008.  It was felt that findings      along the inferior capsule of the liver was unchanged over 5 years      and more consistent with benign process and probably a cyst      complicated by proteinaceous debris or hemorrhage.  9. Barium swallow in March 2009, revealed mild spontaneous GERD.  10.Gastric emptying study in January 2009, was normal.  11.Head CT in July 2009, normal.  12.In January 2009, CT of the abdomen and pelvis without contrast      ordered by ED, revealed air-fluid levels in the ascending and      transverse portions of the colon and fairly extensive stool in the      descending and sigmoid portions of the colon to the rectum,      possibly enteritis.   SUBJECTIVE:  The patient continues to have multiple complaints.  She  states she continues to have daily abdominal pain.  It is usually in the  upper abdomen, left upper quadrant region.  Sometimes, it radiates into  her back.  She describes as burning in quality.  She complains of daily  heartburn, although this is better since on Nexium.  She complains of  burning in her throat, postprandial vomiting the last time was  yesterday.  She  complains of her mouth watering when she gets nauseated.  She complains of urinary incontinence especially nocturnally.  She  states her bowel movements are occurring every other day.  Stools are  dark.  Somewhat improved on Benefiber.  She has gained 6 pounds since we  saw her 4 weeks ago.  She states she has 2 cousins and an aunt who have  Crohn disease.  She is concerned she may have it as well.  She complains  of abdominal bloating.  She states she continues to drink about 12  ounces of soda a day, Kool-Aid water about 8-12 ounces of juice.  She  states she only eats one meal a day, but she tends to overeat during  this meal.  On 24-hour dietary recall; however, she did eat egg and  toast for breakfast, which now she tells me she eats about every day.  She also eats a lot of sweets and chips during the day.  She states she  is not able to exercise because of her abdominal pain.  She states she  is  not able to eat fruits and vegetables because she does not have  enough money.  She continues to smoke 2 packs of cigarettes daily,  however.  She denies dysuria or hematuria.   CURRENT MEDICATIONS:  1. Nexium b.i.d., which she decided to take on her own, we had      suggested to start it once a day at this time.  2. Keppra.  3. Abilify.  4. Albuterol inhaler.  5. MiraLax.  6. Estradiol.  7. Benefiber.  8. Flovent.  9. Trazodone.  10.Dicloxacillin.  11.Tobramycin eye drops.   ALLERGIES:  IVP dye caused her hands to draw and speech to be abnormal.   PHYSICAL EXAMINATION:  VITAL SIGNS:  Weight 202, up 6 pounds a total of  18 pounds since March, temp 98.8, blood pressure 120/80, and pulse 80.  GENERAL:  A pleasant, well-nourished, well-developed black female in no  acute distress.  She is accompanied by her home-health aid.  SKIN:  Warm and dry.  No jaundice.  HEENT:  Sclerae nonicteric.  Oropharyngeal mucosa moist and pink.  No  lesions, erythema, or exudate.  No lymphadenopathy or  thyromegaly.  CHEST:  Lungs are clear to auscultation.  CARDIOVASCULAR:  Regular rate and rhythm.  No murmurs.  ABDOMEN:  Positive bowel sounds.  Abdomen is soft.  She has mild  tenderness in the epigastrium and suprapubic region to deep palpation.  No rebound or guarding.  No organomegaly or masses.  No abdominal bruits  or hernia.   IMPRESSION:  The patient continues to have multiple gastrointestinal  complaints.  She is known to have chronic gastroesophageal reflux  disease and typical heartburn symptoms are somewhat better back on  proton pump inhibitor.  She continues to complain of choking sensation,  globus, and burning in her chest.  She has had some intermittent  vomiting postprandially.  She continues to ignore antireflux measures,  however.  She has gained 6 more pounds.  She continues to smoke 2 packs  a day and eats very large meals in the evening and tends to have quite a  bit of nocturnal reflux symptoms.  She has had fairly extensive  evaluation in the past as outlined above.  She continues to have chronic  constipation, which is moderately controlled.  Abdominal pain, unclear  etiology, probably functional.   PLAN:  1. TSH, CMET, CBC, and lipase.  2. Hemoccult stool x3.  3. Kapidex 60 mg daily, #20 samples provided.  4. Stop Nexium.  5. We will await labs.  Make further recommendations at that point.  I      do not feel strongly that she needs to have any further imaging      study at this point.  We will discuss further with Dr. Cira Servant,      however.  6. Encouraged her to eat 5-6 small meals daily consistent of healthy      food and low-fat food.      Encouraged her to watch her juice intake as it is loaded with      sugar.  7. Encouraged smoking cessation and daily exercise.       Tana Coast, P.A.  Electronically Signed     Kassie Mends, M.D.  Electronically Signed    LL/MEDQ  D:  11/19/2007  T:  11/19/2007  Job:  [id number]   cc:   Melvyn Novas, MD

## 2010-06-21 NOTE — Consult Note (Signed)
NAME:  CAREL, SCHNEE                ACCOUNT NO.:  [id number]   MEDICAL RECORD NO.:  [id number]          PATIENT TYPE:  AMB   LOCATION:  DAY                           FACILITY:  [facility]   PHYSICIAN:  Kassie Mends, M.D.      DATE OF BIRTH:  1971/01/08   DATE OF CONSULTATION:  DATE OF DISCHARGE:                                 CONSULTATION   REASON FOR CONSULTATION:  Refractory GERD/dysphagia/dark stools.   HISTORY OF PRESENT ILLNESS:  Ms. Tindall is a 40 year old African female  who reports a several-year history of worsening heartburn.  She had  previously been on Nexium 40 mg daily.  She did discontinue this in  June.  She began having sensations of severe daily heartburn along with  globus sensation.  She was having frequent throat clearing and  dysphonia.  She complains of regurgitation and indigestion.  She states  she had epigastric pain which radiated through to her mid-back as well.  She did start back on Nexium 40 mg daily when she was seen by Dr. Felecia Shelling  on October 22, 2006.  She says her symptoms have improved but not  completely.  She has noticed dark stools as well.  She has history of  chronic constipation and can go up to a week without a bowel movement.  She tells me the only thing that helps her pain is hydrocodone which she  has been taking half a tablet as needed for pain.  She complains of  dysphagia with both solids and liquids.  With solid food, she tells me  that she feels as though they get stuck in her mid-esophagus.  Liquids  seem to have problems bubbling in her upper esophagus and back of her  throat per her report.   PAST MEDICAL AND SURGICAL HISTORY:  1. Depression.  2. Seizure disorder.  She tells me that her last seizure activity was      in June of 2008.  3. Asthma.  4. Chronic constipation.  5. She tells me she has a learning disorder.  6. History of drug abuse.  7. History of schizoaffective disorder.  8. Status post cholecystectomy.  9. Status post  complete hysterectomy where she had of serous      cystadenoma and a right adnexal stricture as well as bilateral      retroperitoneal fibrosis.  10.History of tubal pregnancy.  11.Sigmoidoscopy by Dr. Jena Gauss on March 20, 1997.  She was found to      have a normal rectum and normal colon to 60 cm.   CURRENT MEDICATIONS:  1. Nexium 40 mg daily.  2. Abilify.  3. Albuterol inhaler p.r.n.  4. Hydrocodone p.r.n.   ALLERGIES:  IVP DYE.   FAMILY HISTORY:  There is no known family history of colorectal  carcinoma, liver or chronic GI problems.   SOCIAL HISTORY:  Ms. Laverdure has been married for 8 years.  She does not  have any children.  She is disabled currently.  She has a 40-pack  history of tobacco use.  She drinks a couple of alcoholic beverages per  month.  She denies any drug use, although does admit to a past history  of drug abuse.   REVIEW OF SYSTEMS:  See HPI and otherwise negative.   PHYSICAL EXAMINATION:  VITAL SIGNS:  Weight 181 pounds, height 66  inches.  Temperature 98 degrees, blood pressure 108/80, and pulse 80.  GENERAL:  Ms. Zackery is a 35-year African American female who is alert,  oriented, pleasant, cooperative, in no acute distress.  HEENT:  Sclerae  nonicteric.  Conjunctivae pink.  Oropharynx pink and moist without any  lesions.  NECK:  Supple without masses or thyromegaly.  HEART:  Regular rate and rhythm.  Normal S1-S2.  No murmurs, clicks,  rubs or gallop.  LUNGS:  Clear to auscultation bilaterally.  ABDOMEN:  Positive bowel sounds x4.  No bruits auscultated.  She does  have moderate tenderness to left upper quadrant and right upper quadrant  on deep palpation.  There is no rebound tenderness or guarding.  No  hepatosplenomegaly or mass.  RECTAL:  No external lesions visualized.  Good sphincter tone.  No  internal masses palpated.  Small amount of light brown stool obtained  from the vault which was Hemoccult-negative.  EXTREMITIES:  Without clubbing or  edema bilaterally.   IMPRESSION:  Ms. Basques is a 40 year old African female with refractory  gastroesophageal reflux disease symptoms despite proton pump inhibitors.  She has history of chronic gastroesophageal reflux disease.  Had been  off proton pump inhibitors for several months and now has refractory  symptoms.  She also notes dysphagia with both solids and liquids.  She  is going to require further evaluation to rule out  erosive esophagitis  as well as complicated gastroesophageal reflux disease with the  development of web ring stricture.   She has chronic constipation potentially made worse by narcotic pain  medications.   PLAN:  1. Continue Nexium 40 mg.  2. EGD with possible ED with Dr. Cira Servant in the near future.  Discussed      the procedure including the risks and benefits to include but not      are not limited to bleeding, infection, perforation, drug reaction.      She agrees to plan,      and consent will be obtained.  3. Further recommendations pending procedure.   I would like to thank Dr. Felecia Shelling for allowing [place] to participate in the  care of Ms. Coombs.      Lorenza Burton, N.P.      Kassie Mends, M.D.  Electronically Signed    KJ/MEDQ  D:  11/16/2006  T:  11/16/2006  Job:  [id number]   cc:   Tesfaye D. Felecia Shelling, MD  Fax: [phone]

## 2010-06-21 NOTE — Assessment & Plan Note (Signed)
NAME:  Jennifer [name], Jennifer [name]                 CHART#:  [id number]   DATE:  02/22/2007                       DOB:  [birth year]   REFERRING PHYSICIAN:  Kofi A. Gerilyn Pilgrim, M.D.   PROBLEM LIST:  1. Constipation.  2. Insomnia.  3. Seizure disorder.  4. Microcytic anemia.   SUBJECTIVE:  Jennifer [name] is a 40 year old female who is complaining of  hoarseness.  She continues to complain that her food feels like it goes  down but come back up.  She is still smoking.  She has changed from soda  to juice.  She has a bowel movement every other day to once every seven  days.  She complains of heartburn and indigestion every other day.  She  did switch to baked food.  She reports having an episode of vomiting  this morning.  She has nausea 2 times a day and lasts a couple of  seconds.  She denies any diarrhea or blood in her stool and her appetite  is still good.   MEDICATIONS:  1. Nexium 40 mg twice a day.  2. Hydrocodone as needed.  3. Phenergan as needed.  4. Ambien at night.  5. Vesicare daily.  6. MiraLax 17 grams daily.   OBJECTIVE:  VITAL SIGNS: Weight 184 pounds (unchanged since October  2008), height 5 feet 6 inches, BMI 29.7 (overweight), temperature is  98.4, blood pressure 128/72, pulse 88.GENERAL:  She is no apparent  distress.  Alert and oriented x4.  HEENT:  Atraumatic, normocephalic.  Pupils equal and reactive to light.  Mouth with no oral lesions.  Posterior pharynx without erythema or  exudate.  LUNGS:  Clear to auscultation bilaterally.CARDIOVASCULAR:  Regular rhythm.  No murmur.  ABDOMEN:  Bowel sounds are present.  Soft, nontender, nondistended.  No  rebound.  No guarding.  Obese.   ASSESSMENT:  Jennifer [name] is a 40 year old female who continues to complain  of daily nausea.  She has occasional vomiting.  The differential  diagnosis includes gastroparesis, non-ulcer dyspepsia, esophageal  motilit disorder, and a low likelihood of uncontrolled reflux disease.   Thank you for  allowing me to see Jennifer [name] in consultation.  My  recommendations follow.   RECOMMENDATIONS:  1. Jennifer [name] is given discharge instructions in writing.  She is asked      to drink at least 6-8 cups of water a day.  She is asked to follow      a high fiber diet. She is asked to use Benefiber once a day.  2. For the management of her reflux, she is asked to stop smoking.      She is given a handout on lifestyle recommendations for managing      reflux.  3. We will check a gastric emptying study.  4. Follow up appointment in 2 months.   ADDENDUM:  GES: nl-1/09       Kassie Mends, M.D.  Electronically Signed     SM/MEDQ  D:  02/22/2007  T:  02/22/2007  Job:  [id number]   cc:   Darleen Crocker A. Gerilyn Pilgrim, M.D.

## 2010-06-21 NOTE — Op Note (Signed)
NAMECINDIA, HUSTEAD                ACCOUNT NO.:  [id number]   MEDICAL RECORD NO.:  [id number]          PATIENT TYPE:  AMB   LOCATION:  DAY                           FACILITY:  [facility]   PHYSICIAN:  Kassie Mends, M.D.      DATE OF BIRTH:  1970/12/23   DATE OF PROCEDURE:  11/27/2006  DATE OF DISCHARGE:                               OPERATIVE REPORT   REFERRING PHYSICIAN:  Dr. Felecia Shelling.   PROCEDURE:  Esophagogastroduodenoscopy with cold forceps biopsy.   INDICATION FOR EXAM:  Ms. Tonelli is a 40 year old female whose had  several years of gastroesophageal reflux disease.  She stopped taking  her Nexium in June because she could not afford it. She returned to Dr.  Felecia Shelling in September and was placed back on Nexium.  Her symptoms of chest  pain, difficulty swallowing liquids, especially soda, and abdominal pain  have improved.  She denies any weight loss.  Her upper endoscopy is  being performed to evaluate persistent dyspepsia. She also uses 2-3  Aleve daily.   FINDINGS:  1. Patchy erythema of the antrum. Biopsies obtained to evaluate for H.      pylori gastritis or eosinophilic gastritis.  2. Normal esophagus without evidence of Barrett's mass, erosions,      stricture or ulceration.  3. Normal duodenal bulb and second portion of the duodenum.   DIAGNOSIS:  Ms. Dinning likely has gastroesophageal reflux disease as an  etiology for her symptoms.  She may also have NSAID gastritis which is  contributing to her symptoms.   RECOMMENDATIONS:  1. She may resume her previous diet and avoid gastric irritants.  She      is given a handout on gastric irritants and gastritis.  2. She should continue the Nexium.  3. No aspirin, NSAIDs or anticoagulation for 5 days.  4. She may follow up with [facility] in 4 weeks to reassess her      abdominal pain and symptoms.  5. Will call her with the results of her biopsies.   MEDICATIONS:  1. Demerol 100 mg IV.  2. Versed 6 mg IV.   PROCEDURE TECHNIQUE:   Physical exam was performed, informed consent was  obtained from the patient after explaining the benefits, risks and  alternatives to the procedure.  The patient was connected to the monitor  and placed in the left lateral position.  Continuous oxygen was provided  by nasal cannula and IV medicine administered through an indwelling  cannula.  After administration of sedation, the patient's esophagus  intubated and the scope  was advanced under direct visualization to the second portion of the  duodenum. The scope was removed slowly by carefully examining the color,  texture, anatomy and integrity of the mucosa on the way out.  The  patient was recovered in endoscopy and discharged home in satisfactory  condition.      Kassie Mends, M.D.  Electronically Signed     SM/MEDQ  D:  11/27/2006  T:  11/28/2006  Job:  [id number]   cc:   Tesfaye D. Felecia Shelling, MD  Fax: [phone]
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
## 2010-06-21 NOTE — Assessment & Plan Note (Signed)
NAME:  Jennifer [name], PAYMENT                 CHART#:  [id number]   DATE:  04/23/2007                       DOB:  [birth year]   REFERRING PHYSICIAN:  None currently.   PROBLEM LIST:  1. Constipation.  2. Insomnia.  3. Seizure disorder.  4. Anemia with a hemoglobin of 11.6 and MCV of 72.8 in January 2009.   SUBJECTIVE:  40 yo female who was last seen in clinic in January of 2009  and she was complaining of hoarseness and feeling a choking sensation.  She was having heartburn and indigestion every other week, and nausea  two times daily.  She had a gastric emptying study in January of 2009,  which was normal.  She feels like pills and solids catch in her throat  as well as soda.  Her heartburn and ingestion are rare since she  switched to baked food, and has been on Nexium twice a day.  She still  continues to smoke two packs of cigarettes a day.  She is not able to  lose any weight.  She has stress due to multiple medical issues and the  possibility that she could be homeless.  Constipation is now better with  water and fiber.  She has not had to use any Phenergan.   MEDICATIONS:  1. Nexium 40 mg twice daily.  She is using it only 30 minutes before      breakfast.  2. Ambien at nighttime.  3. Estradiol.  4. Lexapro.  5. Benefiber daily.  6. Hydrocodone as needed.   OBJECTIVE:  Weight 184 pounds (unchanged since October of 2008),  height  5 feet 6 inches, BMI 29.7 (overweight), temperature 98.2, blood pressure  104/80, pulse 80.  General, she is in no apparent distress.  Alert and oriented x4.LUNGS:  Clear to auscultation bilaterally.  When she lies flat, she feels like  something is coming up in her throat.CARDIOVASCULAR:  Regular rhythm, no  murmur.ABDOMEN:  Bowel sounds are present.  Soft, nontender,  nondistended.  No rebound or guarding.   ASSESSMENT:  Jennifer [name] is a 40 year old female who presents for return  patient visit.  She continues to complain of a choking sensation.  Differential diagnosis includes esophageal motility disorder, primary or  secondary to uncontrolled gastroesophageal reflux disease, non-ulcer  dyspepsia, and globus hystericus.   RECOMMENDATIONS:  1. Ms. Crumm is given her discharge instructions in writing.  She is      asked to eat four to six meals a day.  She is asked to decrease her      soda intake and cigarette use.  2. She should follow a soft, mechanical diet.  She is given a handout      on soft mechanical diet.  3. She should crush large pills if possible.  4. She should take her Nexium 30 minutes before her first and last      meal.  5. Will schedule barium swallow to evaluate her dysphagia.  6. She has a follow up visit in two months.       Kassie Mends, M.D.  Electronically Signed     SM/MEDQ  D:  04/23/2007  T:  04/23/2007  Job:  [id number]

## 2010-06-21 NOTE — Assessment & Plan Note (Signed)
NAME:  Jennifer [name], Jennifer [name]                 CHART#:  [id number]   DATE:  10/22/2007                       DOB:  1971-01-29   CHIEF COMPLAINT:  Difficulty swallowing, reflux, and constipation.   PROBLEM LIST:  1. Chronic GERD with history of LPR-type symptoms.  2. Constipation.  3. Anemia with hemoglobin of 11.6 and MCV of 72.8 in January 2009.  4. Seizure disorder.  5. Insomnia.  6. EGD on November 27, 2006, revealed patchy erythema of the antrum.      Biopsies were negative for H. pylori or eosinophilic gastritis.   CT of the abdomen and pelvis December 2008, revealed a 3-cm high density  cystic/solid mass along the inferior margin of the right hepatic lobe  and high density structures along the inferior margin of the liver  likely representing dropped gallstones or clips.  This was followed up  with MRI of the abdomen January 21, 2007, and was felt that the  findings along the inferior capsule of liver was unchanged over 5 years  and were consistent with a benign process and probably a cyst  complicated by proteinaceous debris or hemorrhage.  She had a barium  swallow, April 26, 2007, which revealed mild spontaneous GERD.   SUBJECTIVE:  The patient is here for further evaluation of recurrent  difficulty swallowing, heartburn, and constipation.  She was last seen  in March 2009.  She states that she has been out of her Nexium for  several months now.  She is having lots of daily heartburn and  particularly difficulties at night time after she lays down.  She wakes  up with coughing and regurgitation of food, lot of burning and heaviness  in her chest.  She does have some intermittent right upper quadrant  cramps that radiates around the right back side.  Her gallbladder was  removed remotely for stones.  She complains of having a bowel movement  only every 3-4 days and has been taking Benefiber as needed.  No bright  red blood per rectum or melena.  Her weight is up 12 pounds.  She  states  she never knows when she is full, when she founds herself overeating.  She consumes a lot of carbonated beverages daily.  She has been drinking  a lot of grape fruit juice and orange juice as well.  She continues to  smoke 2 packs of cigarettes daily.  She notes that her eating habits are  not very good.  She states she has tried to eat multiple small meals a  day, but does state it really has not helped her symptoms and then she  just feels hungry.  She is quite frustrated.  She states she has a lot  of hoarseness again.   CURRENT MEDICATIONS:  She is out of her Nexium, Keppra, Abilify,  albuterol inhaler, MiraLax p.r.n., estradiol, Flovent, and trazodone.   ALLERGIES:  IVP dye caused her hands to draw and her speech to be  abnormal.   PHYSICAL EXAMINATION:  VITAL SIGNS:  Weight 196, up 12 pounds.  Temp  98.8, blood pressure 98/70, and pulse 84.  GENERAL:  A pleasant, well-nourished, well-developed female in no acute  distress.  She is accompanied by her home health aid.  SKIN:  Warm and dry.  No jaundice.  HEENT:  Sclerae nonicteric.  Oropharyngeal mucosa moist and pink.  No  lesions, erythema, or exudate.  CHEST:  Lungs are clear to auscultation.  CARDIAC:  Regular rate and rhythm.  ABDOMEN:  Positive bowel sounds.  Abdomen is soft.  She has most of her  tenderness in the epigastrium which is mild-to-moderate.  She has mild  diffuse tenderness throughout.  No rebound or guarding.  No organomegaly  or masses.  No abdominal bruits or hernias.   IMPRESSION:  The patient is a pleasant 40 year old lady with chronic  gastroesophageal reflux disease with significant flare of symptoms off  of her PPI therapy.  I suspect her choking sensation and globus is  related to her gastroesophageal reflux disease and globus hystericus.  Unlikely that she has developed esophageal stricture, given a fairly  recent barium esophagram which did not reveal any narrowing of her  esophagus.   Unfortunately, she continues to ignore antireflux measures.  She also has chronic constipation which is moderately controlled.   PLAN:  1. She is to resume her Nexium 40 mg daily.  Prescription for 31 with      11 refills given, #15 samples provided as well.  2. She is to limit her caffeine and carbonated beverages to not more      than 20 ounces a day.  3. Decrease smoking with goal to stop.  4. Eat 4-6 smaller meals a day.  5. Wait at least 3 hours before lying down after eating.  6. Elevate head of the bed or sleep on a pillow raised.  7. Take Benefiber once daily to help with bowel movements.  8. She will return to the office in 4 weeks for follow up or call      sooner, if she does not note any improvement of her symptoms.       Tana Coast, P.A.  Electronically Signed     R. Roetta Sessions, M.D.  Electronically Signed    LL/MEDQ  D:  10/22/2007  T:  10/23/2007  Job:  [id number]   cc:   Melvyn Novas, MD

## 2010-06-24 NOTE — Op Note (Signed)
NAME:  Jennifer [name], Jennifer [name]                          ACCOUNT NO.:  [id number]   MEDICAL RECORD NO.:  [id number]                   PATIENT TYPE:  INP   LOCATION:  X007                                 FACILITY:  [facility]   PHYSICIAN:  De Blanch, M.D.         DATE OF BIRTH:  1970-03-20   DATE OF PROCEDURE:  07/28/2003  DATE OF DISCHARGE:                                 OPERATIVE REPORT   PREOPERATIVE DIAGNOSES:  Complex left pelvic mass expanding in size (status  post total abdominal hysterectomy, bilateral salpingo-oophorectomy).   POSTOPERATIVE DIAGNOSES:  Residual ovary with serous cystadenoma as well as  a residual right adnexal structure and bilateral retroperitoneal fibrosis.   PROCEDURE:  Exploratory laparotomy, lysis of adhesions, bilateral salpingo-  oophorectomy, bilateral ureterolysis.   SURGEON:  De Blanch, M.D.   ASSISTANT:  Roseanna Rainbow, M.D., Telford Nab, R.N.   ANESTHESIA:  General with oral tracheal tube.   ESTIMATED BLOOD LOSS:  50 mL.   FINDINGS:  At the time of exploratory laparotomy, the omentum is adherent to  the prior vaginal cuff.  There was approximately a 5 x 6 cm cystic structure  arising from the left pelvic sidewall which was densely adherent to the  sigmoid mesentery and pelvic sidewall, it was bilateral retroperitoneal  fibrosis. On the right side of the pelvis, there was a 3 cm cystic structure  and apparent residual piece of fallopian tube.  The upper abdomen was  normal.   DESCRIPTION OF PROCEDURE:  The patient was brought to the operating room and  after satisfactory attainment of general anesthesia was placed in the  modified lithotomy position in [place] stirrups. The anterior abdominal wall,  perineum and vagina were prepped with Betadine, Foley catheter was inserted  and the patient was draped. The abdomen was entered through a prior  Pfannenstiel incision excising the prior scar.  200 mL of ascites was  encountered and aspirated. The upper abdomen and pelvis were explored with  the above noted findings. Omental adhesions were lysed freeing it from its  adherence in the pelvis. The Bookwalter retractor was assembled and the  small bowel packed out of the pelvis.  Attention was turned to the left  pelvic sidewall and the residual left round ligament was divided and the  lateral pelvic peritoneum was divided opening the retroperitoneal space. The  external iliac artery and vein, internal iliac artery and ureter were  identified high in the pelvis. The ovarian vessels were skeletonized,  clamped, cut, free tied and suture ligated. The cystic structure was freed  from its dense adherence to the sigmoid mesentery using sharp and blunt  dissection and electrocautery for hemostasis.  With care to avoid injury to  the ureter, the lateral pelvic sidewall peritoneum was incised beneath the  cyst. The ovary was densely adherent to the ureter and ureterolysis is  performed freeing the ureter and mobilizing it laterally.  The peritoneum  was further incised  with care taken to excise peritoneum rather than the  residual ovary or cyst wall or fallopian tube. Dissection continued cephalad  until the entire cystic structure was isolated and excised. The ureter was  reinspected and found to be normal.   This ovarian cystic structure was submitted to pathology returning showing  residual ovarian tissue as well as a serous cystadenoma.   Attention was turned to removing the smaller cystic structure off the right  pelvic sidewall. The right round ligament was divided, the retroperitoneal  space opened and the vessels and ureter identified.  The ovarian vessels  were skeletonized, clamped, cut, free tied and suture ligated. Ureterolysis  was performed on this side of the pelvis as well because of dense  retroperitoneal fibrosis.  The ureter was reflected laterally and the  peritoneum around the cystic  structure was excised along with the cystic  structure. Hemostasis again was achieved with cautery. The ureter was  reinspected and found to be intact. Hemostasis was excellent. The pelvis was  irrigated with copious amounts of warm saline. Packs and retractors removed,  the anterior abdominal wall was closed in layers the first being a running  closure of the peritoneum using 2-0 Vicryl. The fascia was inspected,  hemostasis achieved with cautery and the fascia closed with a running suture  of #1 PDS. The subcutaneous tissue was irrigated, hemostasis achieved, prior  scar was freed using cautery. The skin was then closed with skin staples, a  dressing was applied, the patient was awakened from anesthesia and taken to  the recovery room in satisfactory condition.  Sponge, needle and instrument  counts were correct x2.                                               De Blanch, M.D.    DC/MEDQ  D:  07/28/2003  T:  07/28/2003  Job:  [id number]   cc:   Oliva Bustard  [address] [place]  [place] [address]  Fax: [phone]   Roseanna Rainbow, M.D.   Telford Nab, R.N.  [address]  [place], [place] [address]

## 2010-06-24 NOTE — Discharge Summary (Signed)
NAME:  Jennifer [name], DIBARTOLO NO.:  [id number]   MEDICAL RECORD NO.:  [id number]                   PATIENT TYPE:  INP   LOCATION:  0447                                 FACILITY:  [facility]   PHYSICIAN:  De Blanch, M.D.         DATE OF BIRTH:  1970/11/11   DATE OF ADMISSION:  07/28/2003  DATE OF DISCHARGE:  07/29/2003                                 DISCHARGE SUMMARY   HOSPITAL COURSE:  Following admission, the patient was taken to the  operating room and underwent exploratory laparotomy through a Pfannenstiel  incision for a complex left adnexal mass.  She was found to have residual  ovary and fallopian tube.  Initially, there was a small portion of residual  ovary and fallopian tube on the right side of the pelvis.  Both were  resected, and ureterolysis was performed because of retroperitoneal  fibrosis.  Estimated blood loss was 50 cc.   Patient did well overnight, ambulated.  Foley catheter was removed the next  morning.  She was voiding spontaneously, having flatus and bowel movements,  and pain was under adequate control with oral pain medication.  She wished  to be discharged, and it seemed that this was a reasonable plan.  She was  therefore discharged on June 22nd late in the afternoon.   FINAL DIAGNOSIS:  Residual ovary with cyst, retroperitoneal fibrosis.   DISCHARGE INSTRUCTIONS:  Patient was given the usual instructions for pelvic  rest, gradual increase in ambulation and activity but no heavy lifting for  six weeks.  She will resume a regular diet and will use Vicodin and/or Advil  as needed for pain control.  She will return in one week to have her staples  removed.  She will contact us if she has any fever, chills, vaginal  bleeding, or any other untoward symptoms.                                               De Blanch, M.D.    DC/MEDQ  D:  07/29/2003  T:  07/30/2003  Job:  [id number]   cc:   Telford Nab, R.N.  [address]  [place], [place] [address]

## 2010-06-24 NOTE — Consult Note (Signed)
NAME:  Jennifer [name], Jennifer [name] NO.:  [id number]   MEDICAL RECORD NO.:  [id number]                   PATIENT TYPE:  OUT   LOCATION:  GYN                                  FACILITY:  [facility]   PHYSICIAN:  De Blanch, M.D.         DATE OF BIRTH:  Oct 25, 1970   DATE OF CONSULTATION:  07/21/2003  DATE OF DISCHARGE:                                   CONSULTATION   REASON FOR CONSULTATION:  A 40 year old African-American female seen in  consultation at the request of Dr. Oliva Bustard regarding management of an  ovarian cyst.   The patient has a past history of undergoing an abdominal hysterectomy in  2003 and bilateral salpingo-oophorectomy in January of 2005. Recently she  has had increasing pelvic pain and underwent a CT scan in May of 2005  showing a 2.6 cm left adnexal cyst. Because of increasing pain the past  several weeks, the patient underwent a repeat CT scan June 7 revealing a 6.1  x 3 cm left adnexal mass. She reports the pain has gradually been worsening.  There is also some free fluid noted in the posterior cul-de-sac. At the  present time, the patient notes a considerable pain when she voids and in  her lower abdomen is having difficulty going about her daily activities. She  denies any nausea or vomiting although she has pain when she has a bowel  movement.   PAST MEDICAL HISTORY:  Medical illnesses:  1. Hypoglycemia.  2. Depression.  3. Seizures.  4. Asthma/bronchitis.   PAST SURGICAL HISTORY:  TAH 2003, BSO 2005, resection of ectopic and  cholecystectomy.   ALLERGIES:  The patient is possibly allergic to IVP DYE.   OBSTETRICAL HISTORY:  Gravida 0.   CURRENT MEDICATIONS:  1. Dilantin 100 mg t.i.d.  2. Trazone at bedtime.  3. Abifity 10 mg daily.  4. Albuterol t.i.d.  5. Paxil CR 25 mg daily.  6. Benadryl allergy PM p.r.n. sinus problems.   FAMILY HISTORY:  Negative for gynecologic cancer. She has an aunt with  breast cancer,  grandmother with colon cancer. She smokes and is unemployed,  she is married.   PHYSICAL EXAMINATION:  GENERAL:  Reveals a well-developed white female in no  acute distress.  The patient is a pleasant African-American female but who  is in a considerable amount of abdominal pain.  VITAL SIGNS:  Weight 186 pounds, blood pressure 130/70.  HEENT:  Negative.  NECK:  Supple without thyromegaly. There was no supraclavicular or inguinal  adenopathy.  ABDOMEN:  Slightly tender with a minimal amount of rebound. No masses,  organomegaly, ascites or hernias are noted.  PELVIC:  EGBUS, vagina, bladder, urethra are normal.  Cuff is well healed.  No lesions are noted.  Bimanual exam reveals tenderness without any discreet  mass.   IMPRESSION:  An increasing mass in a patient who has had surgery in January  apparently having both tubes  and ovaries removed for pain and adhesive  disease.  The patient has a new and increasing cyst on the left side wall of  questionable origin. This may be a hematoma, lymphocele or some residual  ovarian tissue.  The patient's abdomen is tender enough and her symptoms are  bad enough that I believe she requires surgical intervention although I  believe whatever the process is, is benign.  In addition, she may have a  urinary tract infection, urine culture sent today. She is given a  prescription for Septra DS, 1 tablet b.i.d. for the next 10 days. She is  also given a prescription for Vicodin to be used for pain. We will schedule  her to undergo surgery June 21 and undergo preoperative evaluation beginning  today.   The risks of surgery including hemorrhage, infection, injury to adjacent  viscera, thromboembolic complications and anesthetic risks were outlined.  The patient accepts these risks and wishes to proceed with surgery as soon  as we can perform it. She is aware that I will not be here during her  postoperative stay but that likely Charles A. Clearance Coots, M.D.  will assist with  the surgery and follow her postoperatively.                                               De Blanch, M.D.    DC/MEDQ  D:  07/21/2003  T:  07/21/2003  Job:  [id number]   cc:   Leonette Most A. Clearance Coots, M.D.   Oliva Bustard  [address] [place]  [place] [address]  Fax: [phone]   Telford Nab, R.N.  [phone] [address]  [place], [place] [address]

## 2010-07-06 ENCOUNTER — Other Ambulatory Visit: Payer: Self-pay | Admitting: Internal Medicine

## 2010-10-13 ENCOUNTER — Encounter: Payer: Self-pay | Admitting: Urgent Care

## 2010-10-17 ENCOUNTER — Encounter: Payer: Self-pay | Admitting: Urgent Care

## 2010-10-17 ENCOUNTER — Ambulatory Visit (INDEPENDENT_AMBULATORY_CARE_PROVIDER_SITE_OTHER): Payer: Medicaid Other | Admitting: Urgent Care

## 2010-10-17 DIAGNOSIS — K5900 Constipation, unspecified: Secondary | ICD-10-CM

## 2010-10-17 DIAGNOSIS — R110 Nausea: Secondary | ICD-10-CM

## 2010-10-17 DIAGNOSIS — K219 Gastro-esophageal reflux disease without esophagitis: Secondary | ICD-10-CM

## 2010-10-17 NOTE — Patient Instructions (Signed)
Return stool sample ASAP to our office Continue amitiza twice per day Continue dexilant 60mg  daily Go get your labs (cortisol) checked 8am after nothing to eat or drink after midnight

## 2010-10-17 NOTE — Progress Notes (Signed)
Referring Provider: Toma Deiters, MD Primary Care Physician:  Leo Grosser, MD, MD Primary Gastroenterologist:  Dr. Darrick Penna  Chief Complaint  Patient presents with  . Abdominal Pain    sharp pain on left side/nausea  . Follow-up    IBS/GERD    HPI:  Jennifer [name] is a 40 y.o. female here for follow up for GERD/IBS.  C/o nausea after eating.  Sometimes nausea all day long.  No vomiting.  No particular time of day.  Nausea worse w/ getting up.  Denies headaches or dizziness.  Denies heartburn & indigestion.  Denies constipation or diarrhea.  On amitiza BID.  Miralax prn.  Denies rectal bleeding or melena.  Was asked to return ifobt at last OV, but never did.  TSH normal 4/11.  CMP normal.  Previous GES normal.    Past Medical History  Diagnosis Date  . Seizures   . PTSD (post-traumatic stress disorder)   . GERD (gastroesophageal reflux disease)   . Panic attack   . Asthma   . Anxiety   . Schizophrenia   . Bipolar 1 disorder   . Constipation 1999  . Nausea     nl GES 2009  . S/P endoscopy Feb 2012    nl, s/p [facility] dilator, biopsy benign  . S/P endoscopy 2008    mild gastritis  . History of flexible sigmoidoscopy 1999    normal to 60cm,    Past Surgical History  Procedure Date  . Partial hysterectomy   . Cholecystectomy   . Tonsillectomy   . Hernia repair     incisional with mesh    Current Outpatient Prescriptions  Medication Sig Dispense Refill  . albuterol (ACCUNEB) 0.63 MG/3ML nebulizer solution Take 1 ampule by nebulization every 6 (six) hours as needed.        . cetirizine (ZYRTEC) 10 MG chewable tablet Chew 10 mg by mouth daily.        Marland Kitchen dexlansoprazole (DEXILANT) 60 MG capsule Take 60 mg by mouth daily.        Marland Kitchen estradiol (VIVELLE-DOT) 0.05 MG/24HR Place 1 patch onto the skin once a week.        . fluticasone (FLOVENT HFA) 110 MCG/ACT inhaler Inhale 1 puff into the lungs 2 (two) times daily.        . furosemide (LASIX) 20 MG tablet  Take 20 mg by mouth daily.        Marland Kitchen levETIRAcetam (KEPPRA) 750 MG tablet Take 750 mg by mouth 3 (three) times daily.        Marland Kitchen lubiprostone (AMITIZA) 8 MCG capsule Take 8 mcg by mouth 2 (two) times daily with a meal.        . nitroGLYCERIN (NITROSTAT) 0.4 MG SL tablet Place 0.4 mg under the tongue every 5 (five) minutes as needed.        Marland Kitchen QUEtiapine (SEROQUEL) 300 MG tablet Take 300 mg by mouth at bedtime.          Allergies as of 10/17/2010 - Review Complete 10/17/2010  Allergen Reaction Noted  . Codeine    . Ivp dye (iodinated diagnostic agents) Nausea And Vomiting 10/17/2010  . Latex  03/08/2010    Review of Systems: Gen: Denies any fever, chills, sweats, anorexia, fatigue, weakness, malaise, weight loss, and sleep disorder CV: Denies chest pain, angina, palpitations, syncope, orthopnea, PND, peripheral edema, and claudication. Resp: Denies dyspnea at rest, dyspnea with exercise, cough, sputum, wheezing, coughing up blood, and pleurisy. GI: Denies vomiting  blood, jaundice, and fecal incontinence.   Denies dysphagia or odynophagia. Derm: Denies rash, itching, dry skin, hives, moles, warts, or unhealing ulcers.  Psych: Denies depression, anxiety, memory loss, suicidal ideation, hallucinations, paranoia, and confusion. Heme: Denies bruising, bleeding, and enlarged lymph nodes.  Physical Exam: BP 128/81  Pulse 68  Temp(Src) 97.7 F (36.5 C) (Temporal)  Ht 5\' 6"  (1.676 m)  Wt 219 lb 12.8 oz (99.701 kg)  BMI 35.48 kg/m2 General:   Alert,  Well-developed, obese, pleasant and cooperative in NAD Head:  Normocephalic and atraumatic. Eyes:  Sclera clear, no icterus.   Conjunctiva pink. Mouth:  No deformity or lesions, dentition normal. Neck:  Supple; no masses or thyromegaly. Heart:  Regular rate and rhythm; no murmurs, clicks, rubs,  or gallops. Abdomen:  Soft, obese, nontender and nondistended. No masses, hepatosplenomegaly or hernias noted. Normal bowel sounds, without guarding, and  without rebound. Exam limited given body habitus. Msk:  Symmetrical without gross deformities. Normal posture. Pulses:  Normal pulses noted. Extremities:  Without clubbing or edema. Neurologic:  Alert and  oriented x4;  grossly normal neurologically. Skin:  Intact without significant lesions or rashes. Cervical Nodes:  No significant cervical adenopathy. Psych:  Alert and cooperative. Normal mood and affect.

## 2010-10-17 NOTE — Assessment & Plan Note (Signed)
Well controlled on dexilant 60mg daily 

## 2010-10-17 NOTE — Assessment & Plan Note (Signed)
Chronic nausea without vomiting.  GES normal previously.  ? Medication side effect.  Check fasting cortisol.

## 2010-10-17 NOTE — Assessment & Plan Note (Signed)
On amitiza BID.   Return iFOBT as previously ordered. If positive, colonoscopy.  If negative, colonoscopy age 40 or sooner if problems.

## 2010-10-18 NOTE — Progress Notes (Signed)
Cc to PCP & Referring

## 2010-10-20 ENCOUNTER — Ambulatory Visit (INDEPENDENT_AMBULATORY_CARE_PROVIDER_SITE_OTHER): Payer: Medicaid Other | Admitting: Gastroenterology

## 2010-10-20 DIAGNOSIS — R109 Unspecified abdominal pain: Secondary | ICD-10-CM

## 2010-10-20 LAB — IFOBT (OCCULT BLOOD): IFOBT: NEGATIVE

## 2010-10-20 NOTE — Progress Notes (Signed)
ifobt negative .

## 2010-10-21 ENCOUNTER — Telehealth: Payer: Self-pay

## 2010-10-21 NOTE — Telephone Encounter (Signed)
Heather from [facility] called and wanted to know which meds pt was supposed to be on from our office. Said pt is easily confused.  I told her that per Lorenza Burton, NP on 10/17/2010  She should be on the Amitiza 8 mcg bid and to make sure that is taken with food. Also she should be taking Dexilant 60 mg once daily.

## 2010-10-21 NOTE — Progress Notes (Signed)
Quick Note:  6 month f/u placed in epic ______

## 2010-10-26 LAB — URINALYSIS, ROUTINE W REFLEX MICROSCOPIC
Bilirubin Urine: NEGATIVE
Bilirubin Urine: NEGATIVE
Glucose, UA: NEGATIVE
Hgb urine dipstick: NEGATIVE
Hgb urine dipstick: NEGATIVE
Ketones, ur: NEGATIVE
Nitrite: NEGATIVE
Specific Gravity, Urine: 1.025
Specific Gravity, Urine: 1.025
Urobilinogen, UA: 0.2
pH: 5.5
pH: 5.5

## 2010-10-26 LAB — DIFFERENTIAL
Basophils Relative: 0
Eosinophils Relative: 1
Lymphs Abs: 2.7
Monocytes Absolute: 0.4
Monocytes Relative: 5

## 2010-10-26 LAB — CBC
HCT: 36.5
Hemoglobin: 11.6 — ABNORMAL LOW
MCV: 72.8 — ABNORMAL LOW
Platelets: 252
RBC: 5.01
WBC: 7.2

## 2010-10-26 LAB — AMYLASE: Amylase: 121

## 2010-10-26 NOTE — Telephone Encounter (Signed)
agree

## 2010-10-31 NOTE — Progress Notes (Signed)
NAUSEA LIKELY 2O TO MEDS. REVIEWED.

## 2010-11-18 LAB — STREP A DNA PROBE: Group A Strep Probe: NEGATIVE

## 2010-11-28 ENCOUNTER — Other Ambulatory Visit: Payer: Self-pay

## 2010-11-28 MED ORDER — DEXLANSOPRAZOLE 60 MG PO CPDR: 60.0000 mg | DELAYED_RELEASE_CAPSULE | Freq: Every day | ORAL | Status: DC

## 2010-12-26 ENCOUNTER — Other Ambulatory Visit: Payer: Self-pay | Admitting: Gastroenterology

## 2010-12-28 ENCOUNTER — Ambulatory Visit (HOSPITAL_COMMUNITY)
Admission: RE | Admit: 2010-12-28 | Discharge: 2010-12-28 | Disposition: A | Discharge status: Home / Self Care | Payer: Medicaid Other | Source: Ambulatory Visit | Attending: Family Medicine | Admitting: Family Medicine

## 2010-12-28 DIAGNOSIS — M25569 Pain in unspecified knee: Secondary | ICD-10-CM | POA: Insufficient documentation

## 2010-12-28 DIAGNOSIS — M6281 Muscle weakness (generalized): Secondary | ICD-10-CM | POA: Insufficient documentation

## 2010-12-28 DIAGNOSIS — IMO0001 Reserved for inherently not codable concepts without codable children: Secondary | ICD-10-CM | POA: Insufficient documentation

## 2010-12-28 NOTE — Progress Notes (Signed)
Physical Therapy Evaluation- Medicaid  Patient Details  Name: Jennifer [name] MRN: [id number] Date of Birth: [birth year]  Today's Date: 12/28/2010 Time: 4098-1191 Time Calculation (min): 56 min Charges: 1 eval Visit#: 1  of 4   Re-eval: 01/27/11 Assessment Diagnosis: L knee pain Next MD Visit: unscheduled  Past Medical History:  Past Medical History  Diagnosis Date  . Seizures   . PTSD (post-traumatic stress disorder)   . GERD (gastroesophageal reflux disease)   . Panic attack   . Asthma   . Anxiety   . Schizophrenia   . Bipolar 1 disorder   . Constipation 1999  . Nausea     nl GES 2009  . S/P endoscopy Feb 2012    nl, s/p [facility] dilator, biopsy benign  . S/P endoscopy 2008    mild gastritis  . History of flexible sigmoidoscopy 1999    normal to 60cm,   Past Surgical History:  Past Surgical History  Procedure Date  . Partial hysterectomy   . Cholecystectomy   . Tonsillectomy   . Hernia repair     incisional with mesh    Subjective Symptoms/Limitations Symptoms: Pt is a 40 year old female referred to PT secondary to L knee pain which started 20 years ago and ruled out DVT in June secondary to increased swelling in her lower extremities.  Pt reports that the swelling is intermittent.  She reports that she had increased swelling today, but now it is down.  It was so bad it stretched out her socks.   She reports her pain as a throbbing and tournequet feeling.   She denies any orthopedic surgeries, she has had multiple surgeries in her abdominal region.  Pt lives in an apartment with 16 stairs and over the past week has been having a difficulty going up the stairs without an increase in pain.  Has more pain when she sits down (sharp pain 8/10).  Objective: Leans to her R side sitting on the EOB.  Favors LLE when going from sit to stand to sit.   How long can you stand comfortably?: less than 5 minutes, is unable to cook.  How long can you walk comfortably?: 10  minutes  Pain Assessment Currently in Pain?: Yes Pain Score:   5 Pain Location: Knee Pain Type: Chronic pain   12/28/10 1600  Assessment  Diagnosis L knee pain  Next MD Visit unscheduled  Home Living  Lives With Spouse  Prior Function  Level of Independence Independent with basic ADLs;Independent with gait  Able to Take Stairs? Reciprically  Driving Yes  Vocation On disability  Leisure Hobbies-yes (Comment)  Comments Watching movies, sewing, going to church.   Functional Tests  Functional Tests LEFS: 47/80  Functional Tests Observation: No notable swelling or pitting edema present.  Skin is of normal color and temperature.  Squinting patella  Functional Tests Palpation: Increased pain and tenderness under her patella with axial pressure.  Pain with patellar movement.   LLE AROM (degrees)  Left Knee Extension 0-130 0   Left Knee Flexion 0-140 120   LLE Strength  Left Hip Flexion 3+/5  Left Hip Extension 4/5  Left Hip ABduction 4/5  Left Hip ADduction 3/5  Left Knee Flexion 3+/5  Left Knee Extension 3+/5  Ambulation/Gait  Ambulation/Gait Yes  Gait Pattern Antalgic;Lateral hip instability     Exercise/Treatments Stretches Active Hamstring Stretch: 30 seconds Quad Stretch: 30 seconds ITB Stretch: 30 seconds Supine Bridges: 10 reps Straight Leg Raises: 10 reps  Physical Therapy Assessment and Plan PT Assessment and Plan Clinical Impression Statement: Pt is a 40 year old female referred to PT secondary to acute on chronic L knee pain.  After examination it was found that she has current body structure impairments including increased pain, decreased L knee strength, antalgic gait, decreased L LE flexibility and impaired percieved functional ability which are limiting her ability to participate in household and community mobility.  She will benefit from skilled OPPT in order to address the above impairments in order to improve overall quality of life.  PT Plan: Set up  appropriate HEP secondary to 4 visits. Add gastroc strectch, soleus stretch, step ups forward and lateral    Goals Home Exercise Program Pt will Perform Home Exercise Program: Independently PT Short Term Goals Time to Complete Short Term Goals: 4 weeks PT Short Term Goal 1: Pt will report pain less than or equal to 4/10 when ascending and descending her stairs.  PT Short Term Goal 2: Pt will improve her LE strength to WNL in order to ascend and descend stairs with recipriocal pattern w/1 handrail in order to enter her apartment.  PT Short Term Goal 3: Pt will improve her mechanics when going from sit to stand to decrease risk of secondary injury.  PT Short Term Goal 4: Pt will improve her LEFS by 9 points for improved percieved functional ability.   Problem List Patient Active Problem List  Diagnoses  . ANEMIA  . PSYCHIATRIC DISORDER  . GASTROESOPHAGEAL REFLUX DISEASE, CHRONIC  . CONSTIPATION  . DERANGEMENT MENISCUS  . H N P-LUMBAR  . SCIATICA  . SEIZURE DISORDER  . INSOMNIA  . NAUSEA  . ABDOMINAL PAIN  .  LATEX ALLERGY  . Dysphagia  . Vaginal discharge  . Knee pain  . Muscle weakness (generalized)        Jennifer [name] 12/28/2010, 5:09 PM  Physician Documentation Your signature is required to indicate approval of the treatment plan as stated above.  Please sign and either send electronically or make a copy of this report for your files and return this physician signed original.   Please mark one 1.__approve of plan  2. ___approve of plan with the following conditions.   ______________________________                                                          _____________________ Physician Signature                                                                                                             Date

## 2011-01-04 ENCOUNTER — Ambulatory Visit (HOSPITAL_COMMUNITY): Payer: Medicaid Other | Admitting: Physical Therapy

## 2011-01-04 ENCOUNTER — Telehealth (HOSPITAL_COMMUNITY): Payer: Self-pay

## 2011-01-11 ENCOUNTER — Ambulatory Visit (HOSPITAL_COMMUNITY)
Admission: RE | Admit: 2011-01-11 | Discharge: 2011-01-11 | Disposition: A | Discharge status: Home / Self Care | Payer: Medicaid Other | Source: Ambulatory Visit | Attending: Family Medicine | Admitting: Family Medicine

## 2011-01-11 DIAGNOSIS — M25569 Pain in unspecified knee: Secondary | ICD-10-CM | POA: Insufficient documentation

## 2011-01-11 DIAGNOSIS — IMO0001 Reserved for inherently not codable concepts without codable children: Secondary | ICD-10-CM | POA: Insufficient documentation

## 2011-01-11 DIAGNOSIS — M6281 Muscle weakness (generalized): Secondary | ICD-10-CM | POA: Insufficient documentation

## 2011-01-11 NOTE — Progress Notes (Signed)
Physical Therapy Treatment Patient Details  Name: Jennifer [name] MRN: [id number] Date of Birth: Jul 19, 1970  Today's Date: 01/11/2011 Time: 4098-1191 Time Calculation (min): 32 min Visit#: 2  of 4   Re-eval: 01/27/11 Charges:  therex 30'  Subjective: Symptoms/Limitations Symptoms: Pt. reports more soreness than pain in her L knee.  States she has been trying to avoid placing all her weight on her L LE but goes up and down 16 steps into her appt. several times a day.  States she is able to negotiate them reciprocally. Pain Assessment Currently in Pain?: No/denies   Exercise/Treatments Stretches Active Hamstring Stretch: 2 reps;30 seconds Quad Stretch: 2 reps;30 seconds ITB Stretch: 2 reps;30 seconds Gastroc Stretch: 2 reps;30 seconds;Limitations Gastroc Stretch Limitations: with slant board Aerobic Stationary Bike: 4' @ 1.5; seat 10 Standing Lateral Step Up: 10 reps;Step Height: 4" Forward Step Up: 10 reps;Step Height: 4" Supine Short Arc Quad Sets: 10 reps Bridges: 15 reps Straight Leg Raises: 15 reps Sidelying Hip ABduction: 10 reps Hip ADduction: 10 reps Prone  Hamstring Curl: 10 reps Hip Extension: 10 reps    Physical Therapy Assessment and Plan PT Assessment and Plan Clinical Impression Statement: Pt. C/O increased pain after performing 4 minutes on bike and unable to complete last 2 minutes.  Added prone and sidelying hip/knee exercises as well as steps and gastroc stretch to POC without difficulty. PT Plan: Continue to progress reps.  Add forward step downs and squats next visit.  Give pt. written HEP.    PT - End of Session Activity Tolerance: Patient tolerated treatment well General Behavior During Session: [facility] for tasks performed Cognition: [place] for tasks performed  Jennifer [name] 01/11/2011, 5:26 PM

## 2011-01-18 ENCOUNTER — Telehealth (HOSPITAL_COMMUNITY): Payer: Self-pay

## 2011-01-18 ENCOUNTER — Ambulatory Visit (HOSPITAL_COMMUNITY): Payer: Medicaid Other

## 2011-01-25 ENCOUNTER — Ambulatory Visit (HOSPITAL_COMMUNITY): Payer: Medicaid Other

## 2011-01-25 ENCOUNTER — Telehealth (HOSPITAL_COMMUNITY): Payer: Self-pay

## 2011-02-01 ENCOUNTER — Inpatient Hospital Stay (HOSPITAL_COMMUNITY): Admission: RE | Admit: 2011-02-01 | Payer: Medicaid Other | Source: Ambulatory Visit

## 2011-02-01 ENCOUNTER — Telehealth (HOSPITAL_COMMUNITY): Payer: Self-pay

## 2011-02-21 ENCOUNTER — Encounter: Payer: Self-pay | Admitting: Gastroenterology

## 2011-02-21 ENCOUNTER — Ambulatory Visit (INDEPENDENT_AMBULATORY_CARE_PROVIDER_SITE_OTHER): Payer: Medicaid Other | Admitting: Gastroenterology

## 2011-02-21 VITALS — BP 121/75 | HR 85 | Temp 97.6°F | Ht 66.0 in | Wt 223.4 lb

## 2011-02-21 DIAGNOSIS — K921 Melena: Secondary | ICD-10-CM

## 2011-02-21 DIAGNOSIS — R109 Unspecified abdominal pain: Secondary | ICD-10-CM

## 2011-02-21 DIAGNOSIS — K5900 Constipation, unspecified: Secondary | ICD-10-CM

## 2011-02-21 MED ORDER — LINACLOTIDE 290 MCG PO CAPS: 1.0000 | ORAL_CAPSULE | Freq: Every day | ORAL | Status: DC

## 2011-02-21 MED ORDER — SOD PICOSULFATE-MAG OX-CIT ACD 10-3.5-12 MG-GM-GM PO PACK: 1.0000 | PACK | Freq: Once | ORAL | Status: DC

## 2011-02-21 NOTE — Progress Notes (Signed)
Referring Provider: Leo Grosser, MD Primary Care Physician:  Leo Grosser, MD, MD Primary Gastroenterologist: Dr. Jena Gauss   Chief Complaint  Patient presents with  . Abdominal Pain    HPI:   Jennifer [name] is a 41 year old female who presents today in follow-up for chronic nausea, GERD, IBS, abdominal pain. She continues to report epigastric pain that radiates laterally down left-side of abdomen towards suprapubic region. Complains of lower abdominal pain and a "knot" poking out. Feels like somebody is poking her. Intermittent. States left-sided lower back pain as well that "catches". This pain is similar to abdominal pain. On Amitiza BID, will sometimes go 3-4 days without a BM. Feels like stool is getting "hung up". Has always denies any bright red blood per rectum in the past; however, she is now reporting several incidences of bloody/burgundy stools. She has an EGD on file as recently as March 2012. Nausea is improved but still continues.    Past Medical History  Diagnosis Date  . Seizures   . PTSD (post-traumatic stress disorder)   . GERD (gastroesophageal reflux disease)   . Panic attack   . Asthma   . Anxiety   . Schizophrenia   . Bipolar 1 disorder   . Constipation 1999  . Nausea     nl GES 2009  . S/P endoscopy Feb 2012    nl, s/p [facility] dilator, biopsy benign  . S/P endoscopy 2008    mild gastritis  . History of flexible sigmoidoscopy 1999    normal to 60cm,    Past Surgical History  Procedure Date  . Partial hysterectomy   . Cholecystectomy   . Tonsillectomy   . Hernia repair     incisional with mesh    Current Outpatient Prescriptions  Medication Sig Dispense Refill  . albuterol (ACCUNEB) 0.63 MG/3ML nebulizer solution Take 1 ampule by nebulization every 6 (six) hours as needed.        . cetirizine (ZYRTEC) 10 MG chewable tablet Chew 10 mg by mouth daily.        . citalopram (CELEXA) 40 MG tablet Take 40 mg by mouth daily.      Marland Kitchen  dexlansoprazole (DEXILANT) 60 MG capsule Take 1 capsule (60 mg total) by mouth daily.  31 capsule  11  . estradiol (VIVELLE-DOT) 0.05 MG/24HR Place 1 patch onto the skin once a week.        . fluticasone (FLOVENT HFA) 110 MCG/ACT inhaler Inhale 1 puff into the lungs 2 (two) times daily.        . furosemide (LASIX) 20 MG tablet Take 20 mg by mouth daily.        Marland Kitchen levETIRAcetam (KEPPRA) 750 MG tablet Take 750 mg by mouth 3 (three) times daily.        . nitroGLYCERIN (NITROSTAT) 0.4 MG SL tablet Place 0.4 mg under the tongue every 5 (five) minutes as needed.        . paliperidone (INVEGA) 3 MG 24 hr tablet Take 3 mg by mouth every morning.      Marland Kitchen QUEtiapine Fumarate (SEROQUEL XR) 150 MG 24 hr tablet Take 150 mg by mouth at bedtime.      . traZODone (DESYREL) 100 MG tablet Take 100 mg by mouth at bedtime.      . Linaclotide (LINZESS) 290 MCG CAPS Take 1 capsule by mouth daily.  30 capsule  3  . Sod Picosulfate-Mag Ox-Cit Acd (PREPOPIK) 10-3.5-12 MG-GM-GM PACK Take 1 kit by mouth once.  1 each  0    Allergies as of 02/21/2011 - Review Complete 02/21/2011  Allergen Reaction Noted  . Codeine    . Ivp dye (iodinated diagnostic agents) Nausea And Vomiting 10/17/2010  . Latex  03/08/2010    Family History  Problem Relation Age of Onset  . Crohn's disease Maternal Aunt   . Breast cancer Maternal Grandmother   . Colon cancer Maternal Grandfather   . Pancreatic cancer Maternal Grandfather   . Crohn's disease Cousin     x 2 cousins  . Breast cancer Daughter 69    History   Social History  . Marital Status: Married    Spouse Name: N/A    Number of Children: N/A  . Years of Education: N/A   Social History Main Topics  . Smoking status: Current Some Day Smoker -- 0.5 packs/day for 26 years    Types: Cigarettes  . Smokeless tobacco: None  . Alcohol Use: No  . Drug Use: No  . Sexually Active: None   Other Topics Concern  . None   Social History Narrative  . None    Review of  Systems: Gen: Denies fever, chills, anorexia. Denies fatigue, weakness, weight loss.  CV: Denies chest pain, palpitations, syncope, peripheral edema, and claudication. Resp: Denies dyspnea at rest, cough, wheezing, coughing up blood, and pleurisy. GI: Denies vomiting blood, jaundice, and fecal incontinence.   Denies dysphagia or odynophagia. Derm: Denies rash, itching, dry skin Psych: Denies depression, anxiety, memory loss, confusion. No homicidal or suicidal ideation.  Heme: Denies bruising, bleeding, and enlarged lymph nodes.  Physical Exam: BP 121/75  Pulse 85  Temp(Src) 97.6 F (36.4 C) (Temporal)  Ht 5\' 6"  (1.676 m)  Wt 223 lb 6.4 oz (101.334 kg)  BMI 36.06 kg/m2 General:   Alert and oriented. No distress noted. Pleasant and cooperative.  Head:  Normocephalic and atraumatic. Eyes:  Conjuctiva clear without scleral icterus. Mouth:  Oral mucosa pink and moist. Good dentition. No lesions. Neck:  Supple, without mass or thyromegaly. Heart:  S1, S2 present without murmurs, rubs, or gallops. Regular rate and rhythm. Abdomen:  +BS, soft, mildly tender to palpation mid-abdomen and non-distended. No rebound or guarding. No HSM or masses noted. Msk:  Symmetrical without gross deformities. Normal posture. Extremities:  Without edema. Neurologic:  Alert and  oriented x4;  grossly normal neurologically. Skin:  Intact without significant lesions or rashes. Cervical Nodes:  No significant cervical adenopathy. Psych:  Alert and cooperative. Normal mood and affect.

## 2011-02-21 NOTE — Patient Instructions (Signed)
Stop taking Amitiza. We have given you samples for a new medication for you called "Linzess". This will treat constipation and help with the belly pain that comes with constipation.  Review the low-fat diet. It is important to avoid fatty, greasy foods.  We have set you up for a colonoscopy with Dr. Jena Gauss in the near future. Further recommendations to follow once this is completed.

## 2011-02-22 NOTE — Assessment & Plan Note (Signed)
See "abdominal pain"

## 2011-02-22 NOTE — Assessment & Plan Note (Addendum)
Likely due to hx of IBS, question functional abdominal pain. Thus far labs in past have been unrevealing, EGD on file. Proceeding with TCS due to hematochezia. Due to constipation, intermittent nausea, will switch from Amitiza to Linzess 290 mcg daily. Hopefully, this may help with both constipation and abdominal pain. Follow low-fat diet.

## 2011-02-22 NOTE — Assessment & Plan Note (Signed)
41 year old female with history of chronic abdominal pain, IBS-C now reporting incidences of bright red blood/burgundy stools. No prior TCS performed, does have hx of flex sig in remote past. Likely due to benign anorectal source. Has distant family hx of colon cancer, no first degree relatives.   Proceed with TCS with Dr. Jena Gauss in near future: the risks, benefits, and alternatives have been discussed with the patient in detail. The patient states understanding and desires to proceed. Phenergan 25 mg IV 30 min prior to procedure to help facilitate sedation

## 2011-02-23 NOTE — Progress Notes (Signed)
Faxed to PCP

## 2011-02-27 ENCOUNTER — Telehealth: Payer: Self-pay | Admitting: Gastroenterology

## 2011-02-27 NOTE — Progress Notes (Signed)
Addended by: Jennings Books on: 02/27/2011 12:22 PM   Modules accepted: Orders

## 2011-02-27 NOTE — Telephone Encounter (Signed)
Message copied by Irish Elders on Mon Feb 27, 2011  1:36 PM ------      Message from: Nira Retort      Created: Wed Feb 22, 2011  4:54 PM       Can we add on Phenergan for TCS? Don't thnk I put it on my encounter sheet. Thanks!

## 2011-02-27 NOTE — Telephone Encounter (Signed)
Phenergan added to procedure order - spoke with Selena Batten- Endo aware

## 2011-02-28 ENCOUNTER — Encounter (HOSPITAL_COMMUNITY): Payer: Self-pay | Admitting: Pharmacy Technician

## 2011-02-28 MED ORDER — SODIUM CHLORIDE 0.45 % IV SOLN
Freq: Once | INTRAVENOUS | Status: AC
  Administered 2011-03-01: 10:00:00 via INTRAVENOUS

## 2011-03-01 ENCOUNTER — Encounter (HOSPITAL_COMMUNITY): Payer: Self-pay | Admitting: *Deleted

## 2011-03-01 ENCOUNTER — Encounter (HOSPITAL_COMMUNITY)
Admission: RE | Disposition: A | Discharge status: Home / Self Care | Payer: Self-pay | Source: Ambulatory Visit | Attending: Internal Medicine

## 2011-03-01 ENCOUNTER — Ambulatory Visit (HOSPITAL_COMMUNITY)
Admission: RE | Admit: 2011-03-01 | Discharge: 2011-03-01 | Disposition: A | Discharge status: Home / Self Care | Payer: Medicaid Other | Source: Ambulatory Visit | Attending: Internal Medicine | Admitting: Internal Medicine

## 2011-03-01 DIAGNOSIS — K921 Melena: Secondary | ICD-10-CM

## 2011-03-01 DIAGNOSIS — K644 Residual hemorrhoidal skin tags: Secondary | ICD-10-CM

## 2011-03-01 HISTORY — PX: COLONOSCOPY: SHX5424

## 2011-03-01 SURGERY — COLONOSCOPY
Anesthesia: Moderate Sedation

## 2011-03-01 MED ORDER — MEPERIDINE HCL 100 MG/ML IJ SOLN
INTRAMUSCULAR | Status: AC
  Filled 2011-03-01: qty 2

## 2011-03-01 MED ORDER — MIDAZOLAM HCL 5 MG/5ML IJ SOLN
INTRAMUSCULAR | Status: DC | PRN
  Administered 2011-03-01: 1 mg via INTRAVENOUS
  Administered 2011-03-01 (×2): 2 mg via INTRAVENOUS

## 2011-03-01 MED ORDER — HYDROCORTISONE ACETATE 25 MG RE SUPP: 25.0000 mg | Freq: Two times a day (BID) | RECTAL | Status: AC

## 2011-03-01 MED ORDER — PROMETHAZINE HCL 25 MG/ML IJ SOLN
INTRAMUSCULAR | Status: AC
  Administered 2011-03-01: 25 mg via INTRAVENOUS
  Filled 2011-03-01: qty 1

## 2011-03-01 MED ORDER — PROMETHAZINE HCL 25 MG/ML IJ SOLN
25.0000 mg | Freq: Once | INTRAMUSCULAR | Status: AC
  Administered 2011-03-01: 25 mg via INTRAVENOUS

## 2011-03-01 MED ORDER — MEPERIDINE HCL 100 MG/ML IJ SOLN
INTRAMUSCULAR | Status: DC | PRN
  Administered 2011-03-01: 50 mg via INTRAVENOUS

## 2011-03-01 MED ORDER — SODIUM CHLORIDE 0.9 % IJ SOLN
INTRAMUSCULAR | Status: AC
  Administered 2011-03-01: 10 mL
  Filled 2011-03-01: qty 10

## 2011-03-01 MED ORDER — MIDAZOLAM HCL 5 MG/5ML IJ SOLN
INTRAMUSCULAR | Status: AC
  Filled 2011-03-01: qty 10

## 2011-03-01 MED ORDER — STERILE WATER FOR IRRIGATION IR SOLN
Status: DC | PRN
  Administered 2011-03-01: 11:00:00

## 2011-03-01 NOTE — H&P (Signed)
  I have seen & examined the patient prior to the procedure(s) today and reviewed the history and physical/consultation.   Since starting Linzess, she states her bowels are moving much better and the left-sided abdominal pain has improved as well. Otherwise, there have been no changes.  After consideration of the risks, benefits, alternatives and imponderables, the patient has consented to the procedure(s).

## 2011-03-08 NOTE — Op Note (Signed)
[facility] [address] [place], [place]  [address]  COLONOSCOPY PROCEDURE REPORT  PATIENT:  Jennifer, [name]  MR#:  [id number] BIRTHDATE:  02-05-1971, 40 yrs. old  GENDER:  female ENDOSCOPIST:  R. Roetta Sessions, MD FACP [facility] REF. BY:  Lynnea Ferrier, M.D. PROCEDURE DATE:  03/01/2011 PROCEDURE:  Diagnostic colonoscopy  INDICATIONS:  Hematochezia  INFORMED CONSENT:  The risks, benefits, alternatives and imponderables including but not limited to bleeding, perforation as well as the possibility of a missed lesion have been reviewed. The potential for biopsy, lesion removal, etc. have also been discussed.  Questions have been answered.  All parties agreeable. Please see the history and physical in the medical record for more information.  MEDICATIONS:  Versed 5 mg IV and Demerol 50 mg IV in divided doses  DESCRIPTION OF PROCEDURE:  After a digital rectal exam was performed, the EC-[id number] ([id number]) colonoscope was advanced from the anus through the rectum and colon to the area of the cecum, ileocecal valve and appendiceal orifice.  The cecum was deeply intubated.  These structures were well-seen and photographed for the record.  From the level of the cecum and ileocecal valve, the scope was slowly and cautiously withdrawn.  The mucosal surfaces were carefully surveyed utilizing scope tip deflection to facilitate fold flattening as needed.  The scope was pulled down into the rectum where a thorough examination including retroflexion was performed. <<PROCEDUREIMAGES>>  FINDINGS: Adequate preparation. External hemorrhoids; otherwise normal rectum. Long and tortuous but otherwise normal.  THERAPEUTIC / DIAGNOSTIC MANEUVERS PERFORMED:   None  COMPLICATIONS:  None  CECAL WITHDRAWAL TIME:   13 minutes  IMPRESSION:     External hemorrhoids-likely source of hematochezia; otherwise normal rectum and colon.  RECOMMENDATIONS:  See discharge instructions. Continue  Linzess daily. One-week course of Anusol suppositories. Office followup with [place]                      in 6 weeks.  ______________________________ R. Roetta Sessions, MD Caleen Essex  CC:  Lynnea Ferrier, M.D.  n. eSIGNED:   R. Casimiro Needle Quintel Mccalla at 03/08/2011 09:10 AM  Jennifer [name], [phone]

## 2011-03-13 ENCOUNTER — Encounter (HOSPITAL_COMMUNITY): Payer: Self-pay | Admitting: Internal Medicine

## 2011-03-15 ENCOUNTER — Encounter (HOSPITAL_COMMUNITY): Payer: Self-pay | Admitting: *Deleted

## 2011-03-15 ENCOUNTER — Emergency Department (HOSPITAL_COMMUNITY): Payer: Medicaid Other

## 2011-03-15 ENCOUNTER — Emergency Department (HOSPITAL_COMMUNITY)
Admission: EM | Admit: 2011-03-15 | Discharge: 2011-03-15 | Disposition: A | Discharge status: Home / Self Care | Payer: Medicaid Other | Attending: Emergency Medicine | Admitting: Emergency Medicine

## 2011-03-15 DIAGNOSIS — R609 Edema, unspecified: Secondary | ICD-10-CM

## 2011-03-15 DIAGNOSIS — M79609 Pain in unspecified limb: Secondary | ICD-10-CM | POA: Insufficient documentation

## 2011-03-15 DIAGNOSIS — M79662 Pain in left lower leg: Secondary | ICD-10-CM

## 2011-03-15 DIAGNOSIS — F411 Generalized anxiety disorder: Secondary | ICD-10-CM | POA: Insufficient documentation

## 2011-03-15 DIAGNOSIS — F319 Bipolar disorder, unspecified: Secondary | ICD-10-CM | POA: Insufficient documentation

## 2011-03-15 DIAGNOSIS — M25579 Pain in unspecified ankle and joints of unspecified foot: Secondary | ICD-10-CM | POA: Insufficient documentation

## 2011-03-15 DIAGNOSIS — Z79899 Other long term (current) drug therapy: Secondary | ICD-10-CM | POA: Insufficient documentation

## 2011-03-15 DIAGNOSIS — F4310 Post-traumatic stress disorder, unspecified: Secondary | ICD-10-CM | POA: Insufficient documentation

## 2011-03-15 DIAGNOSIS — J45909 Unspecified asthma, uncomplicated: Secondary | ICD-10-CM | POA: Insufficient documentation

## 2011-03-15 DIAGNOSIS — K219 Gastro-esophageal reflux disease without esophagitis: Secondary | ICD-10-CM | POA: Insufficient documentation

## 2011-03-15 DIAGNOSIS — G8929 Other chronic pain: Secondary | ICD-10-CM | POA: Insufficient documentation

## 2011-03-15 MED ORDER — FUROSEMIDE 40 MG PO TABS: 40.0000 mg | ORAL_TABLET | Freq: Two times a day (BID) | ORAL | Status: DC

## 2011-03-15 MED ORDER — ACETAMINOPHEN 325 MG PO TABS
650.0000 mg | ORAL_TABLET | Freq: Once | ORAL | Status: AC
  Administered 2011-03-15: 650 mg via ORAL
  Filled 2011-03-15: qty 2

## 2011-03-15 MED ORDER — ENOXAPARIN SODIUM 100 MG/ML ~~LOC~~ SOLN
100.0000 mg | Freq: Once | SUBCUTANEOUS | Status: AC
  Administered 2011-03-15: 100 mg via SUBCUTANEOUS
  Filled 2011-03-15: qty 1

## 2011-03-15 MED ORDER — TRAMADOL HCL 50 MG PO TABS
100.0000 mg | ORAL_TABLET | Freq: Once | ORAL | Status: AC
  Administered 2011-03-15: 100 mg via ORAL
  Filled 2011-03-15: qty 2

## 2011-03-15 NOTE — ED Notes (Signed)
Pt c/o pain in both her ankles since Sunday. Pt fell on Sunday prior to ankles starting to hurt. Pt also c/o tingling on her left leg.

## 2011-03-15 NOTE — ED Notes (Signed)
Scheduled for Ultrasound tomorrow - Be at Radiology  at 1:30pm for test at 1:45pm

## 2011-03-15 NOTE — ED Provider Notes (Cosign Needed)
History     CSN: [id number]  Arrival date & time 03/15/11  1531   First MD Initiated Contact with Patient 03/15/11 1913      Chief Complaint  Patient presents with  . Ankle Pain    (Consider location/radiation/quality/duration/timing/severity/associated sxs/prior treatment) HPI  Patient relates she was going to the bathroom about 3 nights ago and when she got up her left knee gave out on her which is done in the past. She states she put her right foot out to try to catch herself and somehow injured her left foot and her right ankle. She complains of swelling of both and pain now. She states her knee is giving out the past and she thinks she has seen  Dr. Romeo Apple for it before. She also says she's had chronic problems with her ankles as she was doing physical therapy in December for her ankles and states "it didn't help" this was ordered by Dr. Tanya Nones at [facility].   PCP Dr. Tanya Nones at [facility] and neurologist Dr. Gerilyn Pilgrim  Past Medical History  Diagnosis Date  . Seizures   . PTSD (post-traumatic stress disorder)   . GERD (gastroesophageal reflux disease)   . Panic attack   . Asthma   . Anxiety   . Schizophrenia   . Bipolar 1 disorder   . Constipation 1999  . Nausea     nl GES 2009  . S/P endoscopy Feb 2012    nl, s/p [facility] dilator, biopsy benign  . S/P endoscopy 2008    mild gastritis  . History of flexible sigmoidoscopy 1999    normal to 60cm,    Past Surgical History  Procedure Date  . Partial hysterectomy   . Cholecystectomy   . Tonsillectomy   . Hernia repair     incisional with mesh  . Colonoscopy 03/01/2011    Procedure: COLONOSCOPY;  Surgeon: Corbin Ade, MD;  [facility]: [facility];  Service: Endoscopy;  Laterality: N/A;  11:00    Family History  Problem Relation Age of Onset  . Crohn's disease Maternal Aunt   . Breast cancer Maternal Grandmother   . Colon cancer Maternal Grandfather     . Pancreatic cancer Maternal Grandfather   . Crohn's disease Cousin     x 2 cousins  . Breast cancer Daughter 64    History  Substance Use Topics  . Smoking status: Current Some Day Smoker -- 0.5 packs/day for 26 years    Types: Cigarettes  . Smokeless tobacco: Not on file  . Alcohol Use: No   lives with spouse  OB History    Grav Para Term Preterm Abortions TAB SAB Ect Mult Living                  Review of Systems  All other systems reviewed and are negative.    Allergies  Codeine; Ivp dye; and Latex  Home Medications   Current Outpatient Rx  Name Route Sig Dispense Refill  . ALBUTEROL SULFATE (2.5 MG/3ML) 0.083% IN NEBU Nebulization Take 2.5 mg by nebulization every 6 (six) hours as needed. For shortness of breath    . CETIRIZINE HCL 10 MG PO TABS Oral Take 10 mg by mouth every morning.    Marland Kitchen CITALOPRAM HYDROBROMIDE 40 MG PO TABS Oral Take 40 mg by mouth every morning.     . CYCLOSPORINE 0.05 % OP EMUL Both Eyes Place 1 drop into both eyes 2 (two) times daily.    Marland Kitchen  DEXLANSOPRAZOLE 60 MG PO CPDR Oral Take 1 capsule (60 mg total) by mouth daily. 31 capsule 11  . ESTRADIOL 0.05 MG/24HR TD PTTW Transdermal Place 1 patch onto the skin once a week. On Wednesdays    . FUROSEMIDE 20 MG PO TABS Oral Take 20 mg by mouth every morning.     Marland Kitchen LEVETIRACETAM 750 MG PO TABS Oral Take 750 mg by mouth 3 (three) times daily.      Marland Kitchen LINACLOTIDE 290 MCG PO CAPS Oral Take 1 capsule by mouth every morning.    Marland Kitchen PALIPERIDONE ER 3 MG PO TB24 Oral Take 3 mg by mouth every morning.    Marland Kitchen POLYETHYLENE GLYCOL 3350 PO POWD Oral Take 17 g by mouth daily.    Marland Kitchen PREGABALIN 75 MG PO CAPS Oral Take 75 mg by mouth 2 (two) times daily.    . QUETIAPINE FUMARATE ER 150 MG PO TB24 Oral Take 150 mg by mouth at bedtime.    . TRAZODONE HCL 100 MG PO TABS Oral Take 100 mg by mouth at bedtime.    Marland Kitchen FLUTICASONE PROPIONATE  HFA 110 MCG/ACT IN AERO Inhalation Inhale 1 puff into the lungs 2 (two) times daily.       Marland Kitchen NITROGLYCERIN 0.4 MG SL SUBL Sublingual Place 0.4 mg under the tongue every 5 (five) minutes as needed. For chest pains    Lyrica 75 mg BID  Albuterol Estrogen patch NTG  BP 106/80  Pulse 64  Temp(Src) 98.2 F (36.8 C) (Oral)  Resp 18  Ht 5\' 6"  (1.676 m)  Wt 224 lb (101.606 kg)  BMI 36.15 kg/m2  SpO2 100%  Vital signs normal    Physical Exam  Nursing note and vitals reviewed. Constitutional: She is oriented to person, place, and time. She appears well-developed and well-nourished.  Non-toxic appearance. She does not appear ill. No distress.  HENT:  Head: Normocephalic and atraumatic.  Right Ear: External ear normal.  Left Ear: External ear normal.  Nose: Nose normal. No mucosal edema or rhinorrhea.  Mouth/Throat: Oropharynx is clear and moist and mucous membranes are normal. No dental abscesses or uvula swelling.  Eyes: Conjunctivae and EOM are normal. Pupils are equal, round, and reactive to light.  Neck: Normal range of motion and full passive range of motion without pain. Neck supple.  Pulmonary/Chest: Effort normal. No respiratory distress. She has no rhonchi. She exhibits no crepitus.  Abdominal: Normal appearance and bowel sounds are normal.  Musculoskeletal: Normal range of motion. She exhibits edema. She exhibits no tenderness.       Moves all extremities well.   patient has bilateral nonpitting edema of both feet. She is tender in her right malleolae, and does not appear to be painful to palpation in her right foot. Her left ankle is nontender but she has a lot of pain to palpation under the MTP joint near the great toe on the sole aspect of her foot and she states this is the area hurts when she tries to walk.. her calves are soft although she states she gets a pulling sensation in her posterior calf on the left. Her left knee has no effusion she seems a little tender over the patellar tendon insertion.  Neurological: She is alert and oriented to person, place, and  time. She has normal strength. No cranial nerve deficit.  Skin: Skin is warm, dry and intact. No rash noted. No erythema. No pallor.  Psychiatric: She has a normal mood and affect. Her speech is normal  and behavior is normal. Her mood appears not anxious.    ED Course  Procedures (including critical care time)   Medications  traMADol (ULTRAM) tablet 100 mg (100 mg Oral Given 03/15/11 1952)  acetaminophen (TYLENOL) tablet 650 mg (650 mg Oral Given 03/15/11 1953)  enoxaparin (LOVENOX) injection 100 mg (100 mg Subcutaneous Given 03/15/11 2259)    Pt scheduled to get outpatient doppler US of her left leg tomorrow as outpatient.    Labs Reviewed - No data to display Dg Ankle Complete Right  03/15/2011  *RADIOLOGY REPORT*  Clinical Data:  Right ankle pain after fall.  RIGHT ANKLE - COMPLETE 3+ VIEW  Comparison:  None.  Findings:  There is no evidence of fracture, dislocation, or joint effusion.  There is no evidence of arthropathy or other focal bone abnormality.  Soft tissues are unremarkable.  IMPRESSION: Negative.  Original Report Authenticated By: Reola Calkins, M.D.   Dg Knee Complete 4 Views Left  03/15/2011  *RADIOLOGY REPORT*  Clinical Data: Left knee pain after fall.  LEFT KNEE - COMPLETE 4+ VIEW  Comparison:  None.  Findings:  There is no evidence of fracture, dislocation, or joint effusion.  There is no evidence of arthropathy or other focal bone abnormality.  Soft tissues are unremarkable.  IMPRESSION: Negative.  Original Report Authenticated By: Reola Calkins, M.D.   Dg Foot Complete Left  03/15/2011  *RADIOLOGY REPORT*  Clinical Data: Left foot pain after fall.  LEFT FOOT - COMPLETE 3+ VIEW  Comparison:  None.  Findings:  There is no evidence of fracture or dislocation.  There is no evidence of arthropathy or other focal bone abnormality. Soft tissues are unremarkable.  IMPRESSION: Negative.  Original Report Authenticated By: Reola Calkins, M.D.    Diagnoses that have been  ruled out:  None  Diagnoses that are still under consideration:  None  Final diagnoses:  Pain of left lower leg  Peripheral edema  Chronic ankle pain   New Prescriptions   FUROSEMIDE (LASIX) 40 MG TABLET    Take 1 tablet (40 mg total) by mouth 2 (two) times daily.   Plan discharge  Devoria Albe, MD, FACEP      MDM          Ward Givens, MD 03/15/11 4098  Ward Givens, MD 03/29/11 1409

## 2011-03-16 ENCOUNTER — Ambulatory Visit (HOSPITAL_COMMUNITY)
Admit: 2011-03-16 | Discharge: 2011-03-16 | Disposition: A | Discharge status: Home / Self Care | Payer: Medicaid Other | Source: Ambulatory Visit | Attending: Emergency Medicine | Admitting: Emergency Medicine

## 2011-03-16 DIAGNOSIS — M79609 Pain in unspecified limb: Secondary | ICD-10-CM | POA: Insufficient documentation

## 2011-04-12 ENCOUNTER — Telehealth: Payer: Self-pay | Admitting: Gastroenterology

## 2011-04-12 ENCOUNTER — Ambulatory Visit: Payer: Medicaid Other | Admitting: Gastroenterology

## 2011-04-12 NOTE — Telephone Encounter (Signed)
Patient was a no-show 

## 2011-04-13 NOTE — Telephone Encounter (Signed)
Noted  

## 2011-04-24 ENCOUNTER — Encounter: Payer: Self-pay | Admitting: Orthopedic Surgery

## 2011-04-24 ENCOUNTER — Ambulatory Visit (INDEPENDENT_AMBULATORY_CARE_PROVIDER_SITE_OTHER): Payer: Medicaid Other | Admitting: Orthopedic Surgery

## 2011-04-24 VITALS — BP 100/64 | Ht 66.0 in | Wt 224.0 lb

## 2011-04-24 DIAGNOSIS — M25569 Pain in unspecified knee: Secondary | ICD-10-CM

## 2011-04-24 DIAGNOSIS — M222X9 Patellofemoral disorders, unspecified knee: Secondary | ICD-10-CM

## 2011-04-24 NOTE — Patient Instructions (Signed)
Take meloxicam x 4 weeks

## 2011-04-24 NOTE — Progress Notes (Signed)
  Subjective:    Jennifer [name] is a 41 y.o. female who presents with soft described complaints of pain and swelling in her hips both knees and both feet and ankles which started approximately 3 months ago.  She does describe some anterior knee pain which started when she moved to an apartment which has 17 steps.  I saw her back in 2011 and thought she had lumbar disc disease and she was sent to [facility] neurosurgery who advised nonoperative treatment for MRI which showed small annular tear LEFT L5-S1 without root impingement and without stenosis.  On February 7 of this year she had a LEFT lower extremity DVT which was normal to the LEFT knee film on February 6 which was also normal here he she went for physical therapy for 2 weeks and that made her knee worse.  She complains of sharp burning 10 out of 10 intermittent pain associated with numbness on the lateral side of her leg and catching in the anterior part of her knee with associated joint swelling.  She's been on meloxicam for 2 weeks.  She reports a catching sensation when she sits down and she climbs the steps   The following portions of the patient's history were reviewed and updated as appropriate: allergies, current medications, past family history, past medical history, past social history, past surgical history and problem list.   Review of Systems A comprehensive review of systems was negative except for: weight gain, snoring, heartburn, frequency, anxiety, seizure, easy bruising, excessive thirst, excessive urination, heat or cold intolerance and seasonal ALLERGY   Objective:    BP 100/64  Ht 5\' 6"  (1.676 m)  Wt 224 lb (101.606 kg)  BMI 36.15 kg/m2  Vital signs are stable as recorded  General appearance is normal  The patient is alert and oriented x3  The patient's mood and affect are normal  Gait assessment: normal The cardiovascular exam reveals normal pulses and temperature without edema swelling.  The lymphatic  system is negative for palpable lymph nodes  The sensory exam is normal.  There are no pathologic reflexes.  Balance is normal.   Exam of the LEFT knee Inspection  No joint swelling or joint effusion, tendernessmedial and lateral joint line Range of motion normal Stability normal Strength normal Skin normal  Painful patellofemoral symptoms with patellar compression with crepitance           Assessment:   Patellofemoral pain LEFT knee  Underlying mononeuritis  Plan:    continue oral anti-inflammatories for 6 weeks.

## 2011-05-01 ENCOUNTER — Encounter: Payer: Self-pay | Admitting: Gastroenterology

## 2011-05-23 ENCOUNTER — Encounter: Payer: Self-pay | Admitting: Orthopedic Surgery

## 2011-05-23 ENCOUNTER — Ambulatory Visit (INDEPENDENT_AMBULATORY_CARE_PROVIDER_SITE_OTHER): Payer: Medicaid Other | Admitting: Orthopedic Surgery

## 2011-05-23 VITALS — BP 102/60 | Ht 66.0 in | Wt 224.0 lb

## 2011-05-23 DIAGNOSIS — M2242 Chondromalacia patellae, left knee: Secondary | ICD-10-CM | POA: Insufficient documentation

## 2011-05-23 DIAGNOSIS — M2240 Chondromalacia patellae, unspecified knee: Secondary | ICD-10-CM

## 2011-05-23 MED ORDER — TRAMADOL-ACETAMINOPHEN 37.5-325 MG PO TABS: 1.0000 | ORAL_TABLET | ORAL | Status: AC | PRN

## 2011-05-23 MED ORDER — MELOXICAM 15 MG PO TABS: 15.0000 mg | ORAL_TABLET | Freq: Every day | ORAL | Status: DC

## 2011-05-23 NOTE — Progress Notes (Signed)
Patient ID: Jennifer [name], female   DOB: [birth year], 41 y.o.   MRN: [id number] Chief Complaint  Patient presents with  . Follow-up    recheck left knee    Recheck LEFT knee. Patient complains of pain, hip pain and back pain with a history of annular tear at L5-S1 back in 2011 she was seen by [facility] neurosurgery had an MRI will be referred back to them regarding that.  She is back here today as a routine followup and scheduled followup regarding her LEFT knee. She has chondromalacia of the patella, which was most likely exacerbated by climbing 17 stairs to get into her apartment after removed.  She is now going to move out of an apartment.  She has not improved with meloxicam over the 6 weeks. Still has pain, giving way in the LEFT knee with grinding and crepitance.  Exam shows an obese black female with the following vital signs BP 102/60  Ht 5\' 6"  (1.676 m)  Wt 224 lb (101.606 kg)  BMI 36.15 kg/m2  She is in no acute distress.  Her knee is still grinding still popping on range of motion. She still has retropatellar pain with compression. She has no instability. Her strength deficit. Skin is normal. Neurovascular exam is intact.  Recommend injection. Continue meloxicam add Ultracef for pain control.  Knee  Injection Procedure Note  Pre-operative Diagnosis: left knee oa  Post-operative Diagnosis: same  Indications: pain  Anesthesia: ethyl chloride   Procedure Details   Verbal consent was obtained for the procedure. Time out was completed.The joint was prepped with alcohol, followed by  Ethyl chloride spray and A 20 gauge needle was inserted into the knee via lateral approach; 4ml 1% lidocaine and 1 ml of depomedrol  was then injected into the joint . The needle was removed and the area cleansed and dressed.  Complications:  None; patient tolerated the procedure well.

## 2011-05-23 NOTE — Patient Instructions (Addendum)
You have received a steroid shot. 15% of patients experience increased pain at the injection site with in the next 24 hours. This is best treated with ice and tylenol extra strength 2 tabs every 8 hours. If you are still having pain please call the office.   Pick up prescription from pharmacy   Schedule consult with [facility] (previously seen for annular tear recurrent back pain and left leg pain)

## 2011-07-11 ENCOUNTER — Other Ambulatory Visit: Payer: Self-pay | Admitting: Gastroenterology

## 2011-07-11 ENCOUNTER — Telehealth: Payer: Self-pay | Admitting: Orthopedic Surgery

## 2011-07-11 NOTE — Telephone Encounter (Addendum)
Patient called to ask about referral to [facility]) per last office note 05/23/11;  States has not heard anything. Nurse spoke with patient and relayed that, as discussed at her office visit, she may directly contact the neurosurgery office, since she has been a previous patient of [facility] Sander Radon).  Patient said she has been trying to contact their office and states she did reach someone yesterday, and she had explained that she was originally referred by Dr. Sudie Bailey, who is no longer her primary care doctor.  Also, she said it was "years ago" that she was referred.   I will follow up, as patient's current primary care may need to do a new referral, due to patient's [employer].

## 2011-07-13 NOTE — Telephone Encounter (Signed)
On 07/11/11, I faxed our recent office notes to both [facility] and to patient's primary care, Dr. Loreli [facility].  Received call (07/12/11) from [facility], per Rene Kocher, referral coordinator for Dr Phoebe Perch, who is the surgeon she had last seen.  She states that Dr. Phoebe Perch reviewed patient's previous notes, and at this point, it appears that the patient does not require surgical intervention, based on the last work-up.  States without further workup (new MRI,etc) they may not be able to schedule patient at this time.  They are aware that patient's insurance requires authorization and referral from primary care.  Called patient and relayed status.  She will contact both offices directly to follow up if she does not hear from them in next few days.

## 2011-09-17 ENCOUNTER — Encounter (HOSPITAL_COMMUNITY): Payer: Self-pay

## 2011-09-17 ENCOUNTER — Emergency Department (HOSPITAL_COMMUNITY)
Admission: EM | Admit: 2011-09-17 | Discharge: 2011-09-17 | Disposition: A | Discharge status: Home / Self Care | Payer: Medicaid Other | Attending: Emergency Medicine | Admitting: Emergency Medicine

## 2011-09-17 DIAGNOSIS — F319 Bipolar disorder, unspecified: Secondary | ICD-10-CM | POA: Insufficient documentation

## 2011-09-17 DIAGNOSIS — F17200 Nicotine dependence, unspecified, uncomplicated: Secondary | ICD-10-CM | POA: Insufficient documentation

## 2011-09-17 DIAGNOSIS — F209 Schizophrenia, unspecified: Secondary | ICD-10-CM | POA: Insufficient documentation

## 2011-09-17 DIAGNOSIS — J029 Acute pharyngitis, unspecified: Secondary | ICD-10-CM | POA: Insufficient documentation

## 2011-09-17 DIAGNOSIS — K219 Gastro-esophageal reflux disease without esophagitis: Secondary | ICD-10-CM | POA: Insufficient documentation

## 2011-09-17 DIAGNOSIS — F4310 Post-traumatic stress disorder, unspecified: Secondary | ICD-10-CM | POA: Insufficient documentation

## 2011-09-17 MED ORDER — DEXAMETHASONE SODIUM PHOSPHATE 4 MG/ML IJ SOLN
8.0000 mg | Freq: Once | INTRAMUSCULAR | Status: AC
  Administered 2011-09-17: 8 mg
  Filled 2011-09-17: qty 2

## 2011-09-17 MED ORDER — IBUPROFEN 800 MG PO TABS
800.0000 mg | ORAL_TABLET | Freq: Once | ORAL | Status: AC
  Administered 2011-09-17: 800 mg via ORAL
  Filled 2011-09-17: qty 1

## 2011-09-17 NOTE — ED Notes (Signed)
Pt reports feeling of swelling to back of throat, sore throat since Saturday about 5 pm

## 2011-09-17 NOTE — ED Provider Notes (Signed)
History    41 year old female with sore throat. Gradual onset about 12 hours ago. Progressively worsening. Scratching sensation in the back of her throat which is worse with swallowing. No ear pain. No fever or chills.  Occasional nonproductive cough. No sick contacts. Smoker.   CSN: [id number]  Arrival date & time 09/17/11  0607   None     Chief Complaint  Patient presents with  . Sore Throat    (Consider location/radiation/quality/duration/timing/severity/associated sxs/prior treatment) HPI  Past Medical History  Diagnosis Date  . Seizures   . PTSD (post-traumatic stress disorder)   . GERD (gastroesophageal reflux disease)   . Panic attack   . Asthma   . Anxiety   . Schizophrenia   . Bipolar 1 disorder   . Constipation 1999  . Nausea     nl GES 2009  . S/P endoscopy Feb 2012    nl, s/p [facility] dilator, biopsy benign  . S/P endoscopy 2008    mild gastritis  . History of flexible sigmoidoscopy 1999    normal to 60cm,    Past Surgical History  Procedure Date  . Partial hysterectomy   . Cholecystectomy   . Tonsillectomy   . Hernia repair     incisional with mesh  . Colonoscopy 03/01/2011    Procedure: COLONOSCOPY;  Surgeon: Corbin Ade, MD;  Location: [facility];  Service: Endoscopy;  Laterality: N/A;  11:00    Family History  Problem Relation Age of Onset  . Crohn's disease Maternal Aunt   . Breast cancer Maternal Grandmother   . Colon cancer Maternal Grandfather   . Pancreatic cancer Maternal Grandfather   . Crohn's disease Cousin     x 2 cousins  . Breast cancer Daughter 71    History  Substance Use Topics  . Smoking status: Current Some Day Smoker -- 0.5 packs/day for 26 years    Types: Cigarettes  . Smokeless tobacco: Not on file  . Alcohol Use: No    OB History    Grav Para Term Preterm Abortions TAB SAB Ect Mult Living                  Review of Systems   Review of symptoms negative unless otherwise noted in  HPI.   Allergies  Codeine; Ivp dye; and Latex  Home Medications   Current Outpatient Rx  Name Route Sig Dispense Refill  . ALBUTEROL SULFATE (2.5 MG/3ML) 0.083% IN NEBU Nebulization Take 2.5 mg by nebulization every 6 (six) hours as needed. For shortness of breath    . CETIRIZINE HCL 10 MG PO TABS Oral Take 10 mg by mouth every morning.    Marland Kitchen CITALOPRAM HYDROBROMIDE 40 MG PO TABS Oral Take 40 mg by mouth every morning.     Marland Kitchen ESTRADIOL 0.05 MG/24HR TD PTTW Transdermal Place 1 patch onto the skin once a week. On Wednesdays    . FLUTICASONE PROPIONATE  HFA 110 MCG/ACT IN AERO Inhalation Inhale 1 puff into the lungs 2 (two) times daily.      . FUROSEMIDE 20 MG PO TABS Oral Take 20 mg by mouth 2 (two) times daily.    Marland Kitchen LEVETIRACETAM 750 MG PO TABS Oral Take 750 mg by mouth 3 (three) times daily.      . MELOXICAM 15 MG PO TABS Oral Take 1 tablet (15 mg total) by mouth daily. 60 tablet 5  . NITROGLYCERIN 0.4 MG SL SUBL Sublingual Place 0.4 mg under the tongue every 5 (five)  minutes as needed. For chest pains    . PALIPERIDONE ER 3 MG PO TB24 Oral Take 3 mg by mouth every morning.    Marland Kitchen TRAMADOL HCL 50 MG PO TABS Oral Take 50 mg by mouth every 6 (six) hours as needed.    . TRAZODONE HCL 100 MG PO TABS Oral Take 100 mg by mouth at bedtime.    . CYCLOSPORINE 0.05 % OP EMUL Both Eyes Place 1 drop into both eyes 2 (two) times daily.    Marland Kitchen LINZESS 290 MCG PO CAPS  TAKE 1 CAPSULE BY MOUTH ONCE A DAY. 31 capsule 5  . POLYETHYLENE GLYCOL 3350 PO POWD Oral Take 17 g by mouth daily.    . QUETIAPINE FUMARATE ER 150 MG PO TB24 Oral Take 150 mg by mouth at bedtime.      BP 133/84  Pulse 71  Temp 98.5 F (36.9 C) (Oral)  Resp 20  Ht 5\' 6"  (1.676 m)  Wt 223 lb (101.152 kg)  BMI 35.99 kg/m2  SpO2 95%  Physical Exam  Nursing note and vitals reviewed. Constitutional: She appears well-developed and well-nourished. No distress.  HENT:  Head: Normocephalic and atraumatic.  Right Ear: External ear normal.   Left Ear: External ear normal.  Mouth/Throat: No oropharyngeal exudate.       Pharyngitis without exudate. Uvula is midline. Handling secretions. No dysphonia. No tongue elevation. Submental tissues are soft. Neck is supple. No adenopathy. No stridor.  Eyes: Conjunctivae are normal. Right eye exhibits no discharge. Left eye exhibits no discharge.  Neck: Neck supple.  Cardiovascular: Normal rate, regular rhythm and normal heart sounds.  Exam reveals no gallop and no friction rub.   No murmur heard. Pulmonary/Chest: Effort normal and breath sounds normal. No respiratory distress.  Abdominal: Soft. She exhibits no distension. There is no tenderness.  Lymphadenopathy:    She has no cervical adenopathy.  Neurological: She is alert.  Skin: Skin is warm and dry.  Psychiatric: She has a normal mood and affect. Her behavior is normal. Thought content normal.    ED Course  Procedures (including critical care time)  Labs Reviewed - No data to display No results found.   1. Acute pharyngitis       MDM  41 year old female with pharyngitis. Zero out of four Centor criteria. Plan symptomatic treatment with a dose of Decadron and NSAIDs. Return precautions were discussed. Followup as needed.        Raeford Razor, MD 09/17/11 [phone]

## 2011-09-21 ENCOUNTER — Encounter: Payer: Self-pay | Admitting: *Deleted

## 2011-09-21 ENCOUNTER — Encounter: Payer: Self-pay | Admitting: Cardiology

## 2011-09-21 ENCOUNTER — Ambulatory Visit (INDEPENDENT_AMBULATORY_CARE_PROVIDER_SITE_OTHER): Payer: Medicaid Other | Admitting: Cardiology

## 2011-09-21 VITALS — BP 112/72 | HR 66 | Ht 66.0 in | Wt 228.5 lb

## 2011-09-21 DIAGNOSIS — I10 Essential (primary) hypertension: Secondary | ICD-10-CM | POA: Insufficient documentation

## 2011-09-21 DIAGNOSIS — R079 Chest pain, unspecified: Secondary | ICD-10-CM

## 2011-09-21 DIAGNOSIS — R0789 Other chest pain: Secondary | ICD-10-CM

## 2011-09-21 DIAGNOSIS — R002 Palpitations: Secondary | ICD-10-CM

## 2011-09-21 NOTE — Patient Instructions (Addendum)
Your physician recommends that you continue on your current medications as directed. Please refer to the Current Medication list given to you today.  Your physician has requested that you have a stress echocardiogram. For further information please visit [email or web address]. Please follow instruction sheet as given.   

## 2011-09-21 NOTE — Assessment & Plan Note (Signed)
Recurrent over the last few years. Baseline ECG is normal. She is referred for ischemic evaluation. Likelihood of underlying obstructive CAD is relatively low. We will assess with an exercise echocardiogram, and inform her of the results. I otherwise recommended diet with weight loss, walking regimen, basic risk factor modification with primary care.

## 2011-09-21 NOTE — Assessment & Plan Note (Signed)
Seems to be somewhat of an orthostatic component based on description, could be related to medications. No definite syncope, ECG is normal. Observation for now.

## 2011-09-21 NOTE — Progress Notes (Signed)
Clinical Summary Jennifer [name] is a medically complex 41 y.o.female referred for cardiology consultation by Ms. Jennifer Leak NP at [facility]. She recently established primary care with that practice. I reviewed her records. She reports a fairly long-standing history of intermittent, atypical chest pain. She has a "sharp, like being stabbed" pain that can radiate across the upper chest, sporadic, nonexertional. Also has a numb feeling in her head sometimes, also sense of palpitations when she stands up. No definite syncope.  She reports undergoing a standard treadmill test several years ago, results unavailable. Her ECG today in the office as normal showing sinus rhythm at 66.  She is on a number of medications, outlined below. At this point she is not exercising regularly, states she has gained over 100 pounds since she was in her 49s. Today we discussed diet and basic walking regimen.   Allergies  Allergen Reactions  . Codeine Other (See Comments)    unknown  . Ivp Dye (Iodinated Diagnostic Agents) Nausea And Vomiting  . Latex Rash    Current Outpatient Prescriptions  Medication Sig Dispense Refill  . albuterol (PROVENTIL) (2.5 MG/3ML) 0.083% nebulizer solution Take 2.5 mg by nebulization every 6 (six) hours as needed. For shortness of breath      . cetirizine (ZYRTEC) 10 MG tablet Take 10 mg by mouth every morning.      . citalopram (CELEXA) 40 MG tablet Take 40 mg by mouth every morning.       . cycloSPORINE (RESTASIS) 0.05 % ophthalmic emulsion Place 1 drop into both eyes 2 (two) times daily.      Marland Kitchen estradiol (ESTRACE) 0.1 MG/GM vaginal cream Place 2 g vaginally daily.      Marland Kitchen estradiol (VIVELLE-DOT) 0.05 MG/24HR Place 1 patch onto the skin once a week. On Wednesdays      . fluticasone (FLOVENT HFA) 110 MCG/ACT inhaler Inhale 1 puff into the lungs 2 (two) times daily.        . furosemide (LASIX) 20 MG tablet Take 20 mg by mouth 2 (two) times daily.      Marland Kitchen levETIRAcetam (KEPPRA)  750 MG tablet Take 750 mg by mouth 3 (three) times daily.        Marland Kitchen LINZESS 290 MCG CAPS TAKE 1 CAPSULE BY MOUTH ONCE A DAY.  31 capsule  5  . meloxicam (MOBIC) 15 MG tablet Take 1 tablet (15 mg total) by mouth daily.  60 tablet  5  . nitroGLYCERIN (NITROSTAT) 0.4 MG SL tablet Place 0.4 mg under the tongue every 5 (five) minutes as needed. For chest pains      . paliperidone (INVEGA) 3 MG 24 hr tablet Take 3 mg by mouth every morning.      . polyethylene glycol powder (GLYCOLAX/MIRALAX) powder Take 17 g by mouth daily.      . QUEtiapine Fumarate (SEROQUEL XR) 150 MG 24 hr tablet Take 150 mg by mouth at bedtime.      . traMADol (ULTRAM) 50 MG tablet Take 50 mg by mouth every 6 (six) hours as needed.      . traZODone (DESYREL) 100 MG tablet Take 100 mg by mouth at bedtime.      Marland Kitchen DISCONTD: pregabalin (LYRICA) 75 MG capsule Take 75 mg by mouth 2 (two) times daily.        Past Medical History  Diagnosis Date  . Seizures   . PTSD (post-traumatic stress disorder)   . GERD (gastroesophageal reflux disease)   . Panic attack   .  Asthma   . Anxiety   . Schizophrenia   . Bipolar 1 disorder   . S/P endoscopy Feb 2012    Nl, s/p 56-French Jennifer [name] dilator, biopsy benign  . S/P endoscopy 2008    Mild gastritis  . History of flexible sigmoidoscopy 1999    Normal to 60cm,    Past Surgical History  Procedure Date  . Partial hysterectomy   . Cholecystectomy   . Tonsillectomy   . Hernia repair     Incisional with mesh  . Colonoscopy 03/01/2011    Procedure: COLONOSCOPY;  Surgeon: Jennifer Ade, MD;  Location: [facility];  Service: Endoscopy;  Laterality: N/A;  11:00    Family History  Problem Relation Age of Onset  . Crohn's disease Maternal Aunt   . Breast cancer Maternal Grandmother   . Colon cancer Maternal Grandfather   . Pancreatic cancer Maternal Grandfather   . Crohn's disease Cousin     x 2 cousins  . Breast cancer Daughter 89    Social History Jennifer [name] reports that she  has been smoking Cigarettes.  She has a 13 pack-year smoking history. She does not have any smokeless tobacco history on file. Jennifer [name] reports that she does not drink alcohol.  Review of Systems No fevers or chills, no cough or hemoptysis. No orthopnea or PND. Stable appetite. Otherwise negative except as outlined.  Physical Examination Filed Vitals:   09/21/11 0958  BP: 112/72  Pulse: 66   Obese woman in no acute distress. HEENT: Conjunctiva and lids normal, oropharynx clear. Neck: Supple, no elevated JVP or carotid bruits, no thyromegaly. Lungs: Clear to auscultation, nonlabored breathing at rest. Cardiac: Regular rate and rhythm, no S3 or significant systolic murmur, no pericardial rub. Abdomen: Soft, nontender, bowel sounds present, no guarding or rebound. Extremities: No pitting edema, distal pulses 2+. Skin: Warm and dry. Musculoskeletal: No kyphosis. Neuropsychiatric: Alert and oriented x3, affect grossly appropriate.   Problem List and Plan   Atypical chest pain Recurrent over the last few years. Baseline ECG is normal. She is referred for ischemic evaluation. Likelihood of underlying obstructive CAD is relatively low. We will assess with an exercise echocardiogram, and inform her of the results. I otherwise recommended diet with weight loss, walking regimen, basic risk factor modification with primary care.  Palpitations Seems to be somewhat of an orthostatic component based on description, could be related to medications. No definite syncope, ECG is normal. Observation for now.    Jennifer [name], M.D., F.A.C.C.

## 2011-09-22 ENCOUNTER — Encounter: Payer: Self-pay | Admitting: Urgent Care

## 2011-09-22 ENCOUNTER — Other Ambulatory Visit: Payer: Self-pay | Admitting: Cardiology

## 2011-09-22 ENCOUNTER — Ambulatory Visit (INDEPENDENT_AMBULATORY_CARE_PROVIDER_SITE_OTHER): Payer: Medicaid Other | Admitting: Urgent Care

## 2011-09-22 VITALS — BP 98/65 | HR 72 | Temp 98.6°F | Ht 66.0 in | Wt 232.0 lb

## 2011-09-22 DIAGNOSIS — K5900 Constipation, unspecified: Secondary | ICD-10-CM

## 2011-09-22 DIAGNOSIS — K219 Gastro-esophageal reflux disease without esophagitis: Secondary | ICD-10-CM

## 2011-09-22 DIAGNOSIS — R1310 Dysphagia, unspecified: Secondary | ICD-10-CM

## 2011-09-22 DIAGNOSIS — R079 Chest pain, unspecified: Secondary | ICD-10-CM

## 2011-09-22 MED ORDER — DOCUSATE SODIUM 100 MG PO CAPS: 100.0000 mg | ORAL_CAPSULE | Freq: Two times a day (BID) | ORAL | Status: AC

## 2011-09-22 MED ORDER — DEXLANSOPRAZOLE 60 MG PO CPDR: 60.0000 mg | DELAYED_RELEASE_CAPSULE | Freq: Every day | ORAL | Status: DC

## 2011-09-22 NOTE — Progress Notes (Signed)
Faxed to PCP

## 2011-09-22 NOTE — Assessment & Plan Note (Signed)
Resume Dexilant 60 mg daily  Need progress report in 10 days,  ENT referral if persistent throat symptoms Recommend 1-2# weight loss per week until ideal body weight through exercise & diet. Low fat/cholesterol diet. Gradually increase exercise from 15 min daily up to 1 hr per day 5 days/week. Limit alcohol use.

## 2011-09-22 NOTE — Assessment & Plan Note (Signed)
Symptoms are more pharyngeal. No symptoms of esophageal dysphagia at this time.  Trial of PPI, however he she does not have 100% response, she should be seen by ENT.

## 2011-09-22 NOTE — Progress Notes (Signed)
Referring Provider: Ernestine Conrad, MD Primary Care Physician:  Ernestine Conrad, MD Primary Gastroenterologist:  Dr. Jena Gauss  Chief Complaint  Patient presents with  . Dysphagia    dysphagia/refills    HPI:  Jennifer [name] is a 41 y.o. female here for GERD and needs refills. She is also having "throat symptoms". She was recently seen in the emergency department at [facility] for acute pharyngitis. She was treated with NSAIDs and Decadron. She feels like she has water brash and sore throat. She has frequent throat clearing. She has been off of her PPI for 10 days as she ran out of Dexilant. Last EGD by Dr. Jena Gauss 03/10/10 showed non-H. Pylori gastritis, moderate hiatal hernia, and she was empirically dilated with a 56 [place] Maloney dilator.  She is having heartburn and indigestion. She denies any nausea or vomiting. She denies any rectal bleeding or melena. She is requesting stool softeners as she is ran out them as well. She has occasional constipation. Otherwise she feels well.  Past Medical History  Diagnosis Date  . Seizures   . PTSD (post-traumatic stress disorder)   . GERD (gastroesophageal reflux disease)   . Panic attack   . Asthma   . Anxiety   . Schizophrenia   . Bipolar 1 disorder   . S/P endoscopy Feb 2012    Nl, s/p 56-French [facility] dilator, biopsy benign  . S/P endoscopy 2008    Mild gastritis  . History of flexible sigmoidoscopy 1999    Normal to 60cm,    Past Surgical History  Procedure Date  . Partial hysterectomy   . Cholecystectomy   . Tonsillectomy   . Hernia repair     Incisional with mesh  . Colonoscopy 03/01/2011    Rourk-External hemorrhoids/otherwise normal rectum and colon.    Current Outpatient Prescriptions  Medication Sig Dispense Refill  . albuterol (PROVENTIL) (2.5 MG/3ML) 0.083% nebulizer solution Take 2.5 mg by nebulization every 6 (six) hours as needed. For shortness of breath      . cetirizine (ZYRTEC) 10 MG tablet Take 10 mg by mouth every  morning.      . cycloSPORINE (RESTASIS) 0.05 % ophthalmic emulsion Place 1 drop into both eyes 2 (two) times daily.      Marland Kitchen estradiol (ESTRACE) 0.1 MG/GM vaginal cream Place 2 g vaginally daily.      Marland Kitchen estradiol (VIVELLE-DOT) 0.05 MG/24HR Place 1 patch onto the skin once a week. On Wednesdays      . furosemide (LASIX) 20 MG tablet Take 20 mg by mouth 2 (two) times daily.      Marland Kitchen levETIRAcetam (KEPPRA) 750 MG tablet Take 750 mg by mouth 3 (three) times daily.        Marland Kitchen LINZESS 290 MCG CAPS TAKE 1 CAPSULE BY MOUTH ONCE A DAY.  31 capsule  5  . meloxicam (MOBIC) 15 MG tablet Take 1 tablet (15 mg total) by mouth daily.  60 tablet  5  . nitroGLYCERIN (NITROSTAT) 0.4 MG SL tablet Place 0.4 mg under the tongue every 5 (five) minutes as needed. For chest pains      . paliperidone (INVEGA) 3 MG 24 hr tablet Take 3 mg by mouth every morning.      . polyethylene glycol powder (GLYCOLAX/MIRALAX) powder Take 17 g by mouth daily.      . QUEtiapine Fumarate (SEROQUEL XR) 150 MG 24 hr tablet Take 150 mg by mouth at bedtime.      . traMADol (ULTRAM) 50 MG tablet  Take 50 mg by mouth every 6 (six) hours as needed.      . traZODone (DESYREL) 100 MG tablet Take 100 mg by mouth at bedtime.      Marland Kitchen dexlansoprazole (DEXILANT) 60 MG capsule Take 1 capsule (60 mg total) by mouth daily.  10 capsule  0  . dexlansoprazole (DEXILANT) 60 MG capsule Take 1 capsule (60 mg total) by mouth daily.  31 capsule  5  . docusate sodium (COLACE) 100 MG capsule Take 1 capsule (100 mg total) by mouth 2 (two) times daily.  60 capsule  11  . DISCONTD: pregabalin (LYRICA) 75 MG capsule Take 75 mg by mouth 2 (two) times daily.        Allergies as of 09/22/2011 - Review Complete 09/22/2011  Allergen Reaction Noted  . Codeine Other (See Comments)   . Ivp dye (iodinated diagnostic agents) Nausea And Vomiting 10/17/2010  . Latex Rash 03/08/2010    Review of Systems: See history of present illness, otherwise negative review of systems Physical  Exam: BP 98/65  Pulse 72  Temp 98.6 F (37 C) (Temporal)  Ht 5\' 6"  (1.676 m)  Wt 232 lb (105.235 kg)  BMI 37.45 kg/m2 General:   Alert,  Well-developed, well-nourished, pleasant and cooperative in NAD.  Accompanied by her social worker today. Eyes:  Sclera clear, no icterus.   Conjunctiva pink. Mouth:  No deformity or lesions, oropharynx pink and moist. Neck:  Supple; no masses or thyromegaly. Heart:  Regular rate and rhythm; no murmurs, clicks, rubs,  or gallops. Abdomen:  Normal bowel sounds.  No bruits.  Soft, non-tender and non-distended without masses, hepatosplenomegaly or hernias noted.  No guarding or rebound tenderness.   Rectal:  Deferred. Msk:  Symmetrical without gross deformities.  Pulses:  Normal pulses noted. Extremities:  No clubbing or edema. Neurologic:  Alert and oriented x4;  grossly normal neurologically. Skin:  Intact without significant lesions or rashes.

## 2011-09-22 NOTE — Patient Instructions (Addendum)
Resume Dexilant 60 mg daily for acid reflux You may use Colace stool softeners twice a day as needed for constipation If this does not work, you may take Linzess as directed Need progress report in 10 days, & if you are still having throat symptoms, you may be referred to ENT doctor Recommend 1-2# weight loss per week until ideal body weight through exercise & diet. Low fat/cholesterol diet. Gradually increase exercise from 15 min daily up to 1 hr per day 5 days/week. Limit alcohol use.

## 2011-09-22 NOTE — Assessment & Plan Note (Addendum)
Colace 100 mg twice a day.  If no results,linzess 290mg  daily.  Office visit in 6 months

## 2011-09-27 ENCOUNTER — Telehealth: Payer: Self-pay

## 2011-09-27 NOTE — Telephone Encounter (Signed)
stress echocardiogram On medications set for 08-27 [facility]

## 2011-09-27 NOTE — Telephone Encounter (Signed)
Pt has Medicaid only.  No precert required for stress echo.

## 2011-10-03 DIAGNOSIS — R079 Chest pain, unspecified: Secondary | ICD-10-CM

## 2011-10-18 ENCOUNTER — Telehealth: Payer: Self-pay | Admitting: *Deleted

## 2011-10-18 ENCOUNTER — Other Ambulatory Visit: Payer: Self-pay

## 2011-10-18 NOTE — Telephone Encounter (Signed)
Message copied by Eustace Markos on Wed Oct 18, 2011  4:01 PM ------      Message from: MCDOWELL, Illene Bolus      Created: Wed Oct 18, 2011 11:13 AM       I reviewed the report. This is a relatively low risk study for the presence of obstructive CAD. No further cardiac testing anticipated at this time. Patient can continue to followup with her primary care provider.

## 2011-10-18 NOTE — Telephone Encounter (Signed)
Patient informed. 

## 2011-11-17 ENCOUNTER — Telehealth: Payer: Self-pay | Admitting: Urgent Care

## 2011-11-17 NOTE — Telephone Encounter (Signed)
Received request for lyrica 75mg  BID from [facility].  Per last OV note 09/12/11, pt was no longer on this medication.  Please call pt to verify.  If so, let [facility] know.  Thanks

## 2011-11-20 NOTE — Telephone Encounter (Signed)
Informed pt to check with PCP about the Lyrica.

## 2011-11-20 NOTE — Telephone Encounter (Signed)
I called pt. She does not know what she is taking, she said yes, she thought she was taking it. I called Mellody Dance, at [facility] pharmacy. He said the last script the pt had was 12/01/2010 by Dr. Jackolyn Confer PA. She only had 6 refills on that.

## 2011-11-20 NOTE — Telephone Encounter (Signed)
FU w/ PCP for this

## 2011-11-22 ENCOUNTER — Ambulatory Visit (INDEPENDENT_AMBULATORY_CARE_PROVIDER_SITE_OTHER): Payer: Medicaid Other | Admitting: Urgent Care

## 2011-11-22 ENCOUNTER — Encounter: Payer: Self-pay | Admitting: Urgent Care

## 2011-11-22 VITALS — BP 104/71 | HR 67 | Temp 98.2°F | Ht 67.0 in | Wt 237.4 lb

## 2011-11-22 DIAGNOSIS — K5900 Constipation, unspecified: Secondary | ICD-10-CM

## 2011-11-22 DIAGNOSIS — K219 Gastro-esophageal reflux disease without esophagitis: Secondary | ICD-10-CM

## 2011-11-22 DIAGNOSIS — E669 Obesity, unspecified: Secondary | ICD-10-CM

## 2011-11-22 MED ORDER — LINACLOTIDE 290 MCG PO CAPS: 1.0000 | ORAL_CAPSULE | Freq: Every morning | ORAL | Status: DC

## 2011-11-22 NOTE — Assessment & Plan Note (Addendum)
Recommend 1-2# weight loss per week until ideal body weight through exercise & diet. Low fat/cholesterol diet. Gradually increase exercise from 15 min daily up to 1 hr per day 5 days/week.  Try WATER AEROBICS. Limit alcohol use.  Office visit in 6 months

## 2011-11-22 NOTE — Progress Notes (Signed)
Primary Care Physician:  Ernestine Conrad, MD Primary Gastroenterologist:  Dr. Jena Gauss  Chief Complaint  Patient presents with  . Gastrophageal Reflux    HPI:  Jennifer [name] is a 41 y.o. female here for GERD & constipation.  She was seen in August by me for LPR symptoms, however she did not get dexilant 60mg  daily filled, so she has been off PPI.  She is seeing Physical therapy in [facility] for left knee pain & knee effusion.  She tells me she may have to have fluid drawn off.  She is unable to do a lot of exercise & has been gaining weight.  C/o water brash and sore throat.  C/o heartburn & indigestion.  She has frequent throat clearing.  Last EGD by Dr. Jena Gauss 03/10/10 showed non-H. Pylori gastritis, moderate hiatal hernia, and she was empirically dilated with a 56 [place] Maloney dilator.  She is having heartburn and indigestion. She denies any nausea or vomiting. She denies any rectal bleeding or melena. C/o chronic constipation.  Failed colace stool softeners, but never started taking linzess.  Past Medical History  Diagnosis Date  . Seizures   . PTSD (post-traumatic stress disorder)   . GERD (gastroesophageal reflux disease)   . Panic attack   . Asthma   . Anxiety   . Schizophrenia   . Bipolar 1 disorder   . S/P endoscopy Feb 2012    Nl, s/p 56-French [facility] dilator, biopsy benign  . S/P endoscopy 2008    Mild gastritis  . History of flexible sigmoidoscopy 1999    Normal to 60cm,    Past Surgical History  Procedure Date  . Partial hysterectomy   . Cholecystectomy   . Tonsillectomy   . Hernia repair     Incisional with mesh  . Colonoscopy 03/01/2011    Rourk-External hemorrhoids/otherwise normal rectum and colon.    Current Outpatient Prescriptions  Medication Sig Dispense Refill  . cetirizine (ZYRTEC) 10 MG tablet Take 10 mg by mouth every morning.      . ciprofloxacin-dexamethasone (CIPRODEX) otic suspension Place 4 drops into the left ear 2 (two) times daily.      . citalopram  (CELEXA) 40 MG tablet Take 40 mg by mouth daily.      Marland Kitchen estradiol (ESTRACE) 0.1 MG/GM vaginal cream Place 2 g vaginally daily.      Marland Kitchen estradiol (VIVELLE-DOT) 0.05 MG/24HR Place 1 patch onto the skin once a week. On Wednesdays      . fluticasone (FLONASE) 50 MCG/ACT nasal spray Place 2 sprays into the nose daily.      . furosemide (LASIX) 20 MG tablet Take 20 mg by mouth 2 (two) times daily.      Marland Kitchen lubiprostone (AMITIZA) 8 MCG capsule Take 8 mcg by mouth 2 (two) times daily with a meal.      . meloxicam (MOBIC) 15 MG tablet Take 1 tablet (15 mg total) by mouth daily.  60 tablet  5  . nitroGLYCERIN (NITROSTAT) 0.4 MG SL tablet Place 0.4 mg under the tongue every 5 (five) minutes as needed. For chest pains      . paliperidone (INVEGA) 3 MG 24 hr tablet Take 3 mg by mouth every morning.      Marland Kitchen QUEtiapine Fumarate (SEROQUEL XR) 150 MG 24 hr tablet Take 150 mg by mouth at bedtime.      . traMADol (ULTRAM) 50 MG tablet Take 50 mg by mouth every 6 (six) hours as needed.      Marland Kitchen  traZODone (DESYREL) 100 MG tablet Take 100 mg by mouth at bedtime.      Marland Kitchen dexlansoprazole (DEXILANT) 60 MG capsule Take 1 capsule (60 mg total) by mouth daily.  10 capsule  0  . dexlansoprazole (DEXILANT) 60 MG capsule Take 1 capsule (60 mg total) by mouth daily.  31 capsule  5  . Linaclotide 290 MCG CAPS Take 1 capsule by mouth every morning.  30 capsule  5  . DISCONTD: pregabalin (LYRICA) 75 MG capsule Take 75 mg by mouth 2 (two) times daily.        Allergies as of 11/22/2011 - Review Complete 11/22/2011  Allergen Reaction Noted  . Codeine Other (See Comments)   . Ivp dye (iodinated diagnostic agents) Nausea And Vomiting 10/17/2010  . Latex Rash 03/08/2010   Review of Systems: See history of present illness,c/o left knee, thigh pain & swelling.  Otherwise negative ROS Physical Exam: BP 104/71  Pulse 67  Temp 98.2 F (36.8 C) (Temporal)  Ht 5\' 7"  (1.702 m)  Wt 237 lb 6.4 oz (107.684 kg)  BMI 37.18 kg/m2 General:    Alert,  Well-developed, obese, pleasant and cooperative in NAD.  Accompanied by her husband today. Eyes:  Sclera clear, no icterus.   Conjunctiva pink. Mouth:  No deformity or lesions, oropharynx pink and moist. Neck:  Supple; no masses or thyromegaly. Heart:  Regular rate and rhythm; no murmurs, clicks, rubs,  or gallops. Abdomen:  Normal bowel sounds.  No bruits.  Soft, non-tender and non-distended without masses, hepatosplenomegaly or hernias noted.  No guarding or rebound tenderness.  Exam limited given pt's body habitus. Rectal:  Deferred. Msk:  Symmetrical without gross deformities.  Pulses:  Normal pulses noted. Extremities:  No clubbing or edema. Neurologic:  Alert and oriented x4;  grossly normal neurologically. Skin:  Intact without significant lesions or rashes.

## 2011-11-22 NOTE — Patient Instructions (Addendum)
Be sure to start Linzess daily for constipation Stop Amitiza Start Dexilant 60mg  daily for acid reflux Recommend 1-2# weight loss per week until ideal body weight through exercise & diet. Low fat/cholesterol diet. Gradually increase exercise from 15 min daily up to 1 hr per day 5 days/week.  Try WATER AEROBICS. Limit alcohol use.

## 2011-11-22 NOTE — Progress Notes (Signed)
Faxed to PCP

## 2011-11-22 NOTE — Assessment & Plan Note (Addendum)
Jennifer [name] is a pleasant 41 y.o. female with chronic constipation with pain who has failed colace.     Be sure to start Linzess daily for constipation Stop Amitiza

## 2011-11-22 NOTE — Assessment & Plan Note (Addendum)
Chronic GERD with laryngeal symptoms, not on PPI due to meds not picked up from pharmacy.  Start Dexilant 60mg  daily for acid reflux.    Call if no relief after starting PPI for 2 weeks.  Consider ENT referral at that time if no relief.

## 2012-02-05 ENCOUNTER — Ambulatory Visit (HOSPITAL_COMMUNITY): Payer: Medicaid Other | Attending: Family Medicine | Admitting: Physical Therapy

## 2012-02-29 ENCOUNTER — Ambulatory Visit: Payer: Medicaid Other | Admitting: Gastroenterology

## 2012-05-20 ENCOUNTER — Encounter: Payer: Self-pay | Admitting: Internal Medicine

## 2012-06-06 ENCOUNTER — Ambulatory Visit: Payer: Medicaid Other | Admitting: Gastroenterology

## 2012-06-07 ENCOUNTER — Ambulatory Visit: Payer: Medicaid Other | Admitting: Gastroenterology

## 2012-06-20 ENCOUNTER — Ambulatory Visit (INDEPENDENT_AMBULATORY_CARE_PROVIDER_SITE_OTHER): Payer: Medicaid Other | Admitting: Gastroenterology

## 2012-06-20 ENCOUNTER — Encounter: Payer: Self-pay | Admitting: Gastroenterology

## 2012-06-20 VITALS — BP 107/74 | HR 94 | Temp 97.9°F | Ht 66.0 in | Wt 250.6 lb

## 2012-06-20 DIAGNOSIS — K219 Gastro-esophageal reflux disease without esophagitis: Secondary | ICD-10-CM

## 2012-06-20 DIAGNOSIS — R635 Abnormal weight gain: Secondary | ICD-10-CM

## 2012-06-20 DIAGNOSIS — K5900 Constipation, unspecified: Secondary | ICD-10-CM

## 2012-06-20 MED ORDER — LINACLOTIDE 145 MCG PO CAPS: 145.0000 ug | ORAL_CAPSULE | Freq: Every day | ORAL | Status: DC

## 2012-06-20 MED ORDER — PANTOPRAZOLE SODIUM 40 MG PO TBEC: 40.0000 mg | DELAYED_RELEASE_TABLET | Freq: Every day | ORAL | Status: DC

## 2012-06-20 NOTE — Progress Notes (Signed)
Primary Care Physician: Ernestine Conrad, MD  Primary Gastroenterologist:  Roetta Sessions, MD    Chief Complaint  Patient presents with  . Dysphagia    HPI: Jennifer [name] is a 42 y.o. female here with complaints of refractory GERD. Complains of one-month history of recurrent waterbrash and sore throat. Denies typical heartburn or indigestion. Notes that she feels like acid is coming up into her throat when she takes a deep breath, yawns, lays down. Recently seen in urgent care for severe sore throat and congestion. Given Levaquin. Denies fever. States her throat feels some better. Denies dysphagia. C/o intermittent sharp pain in upper abdomen at times. Wakes up in the mornings with food/stomach secretions on her face. No vomiting. C/O having difficulty passing BMs. has to strain. Stools hard about 25% of the time. No melena, brbpr. Has to apply vaginal pressure to have BM at times. Last EGD by Dr. Jena Gauss 03/10/10 showed non-H.Pylori gastritis, moderate hh, empirically dilated with 10F Maloney dilator.    Wants surgery to help her loose weight.   Current Outpatient Prescriptions  Medication Sig Dispense Refill  . albuterol (PROVENTIL HFA;VENTOLIN HFA) 108 (90 BASE) MCG/ACT inhaler Inhale 2 puffs into the lungs every 6 (six) hours as needed for wheezing.      . cetirizine (ZYRTEC) 10 MG tablet Take 10 mg by mouth every morning.      . citalopram (CELEXA) 40 MG tablet Take 40 mg by mouth daily.      . cyclobenzaprine (FLEXERIL) 5 MG tablet Take 5 mg by mouth 3 (three) times daily as needed for muscle spasms.      Marland Kitchen dexlansoprazole (DEXILANT) 60 MG capsule Take 60 mg by mouth daily.      Marland Kitchen estradiol (ESTRACE) 0.1 MG/GM vaginal cream Place 2 g vaginally daily.      Marland Kitchen estradiol (VIVELLE-DOT) 0.05 MG/24HR Place 1 patch onto the skin once a week. On Wednesdays      . fluticasone (FLONASE) 50 MCG/ACT nasal spray Place 2 sprays into the nose daily.      . furosemide (LASIX) 20 MG tablet Take 20 mg by mouth 2  (two) times daily.      Marland Kitchen levETIRAcetam (KEPPRA) 750 MG tablet Take 750 mg by mouth every 12 (twelve) hours. 3 tablets at QHS      . levofloxacin (LEVAQUIN) 500 MG tablet Take 500 mg by mouth daily.      Marland Kitchen lubiprostone (AMITIZA) 8 MCG capsule Take 8 mcg by mouth 2 (two) times daily with a meal.      . meloxicam (MOBIC) 15 MG tablet Take 1 tablet (15 mg total) by mouth daily.  60 tablet  5  . naproxen (NAPROSYN) 500 MG tablet Take 500 mg by mouth 2 (two) times daily with a meal.      . nitroGLYCERIN (NITROSTAT) 0.4 MG SL tablet Place 0.4 mg under the tongue every 5 (five) minutes as needed. For chest pains      . paliperidone (INVEGA) 3 MG 24 hr tablet Take 3 mg by mouth every morning.      . pregabalin (LYRICA) 75 MG capsule Take 75 mg by mouth 2 (two) times daily.      . QUEtiapine Fumarate (SEROQUEL XR) 150 MG 24 hr tablet Take 150 mg by mouth at bedtime.       No current facility-administered medications for this visit.    Allergies as of 06/20/2012 - Review Complete 06/20/2012  Allergen Reaction Noted  . Codeine Other (See Comments)   .  Ivp dye (iodinated diagnostic agents) Nausea And Vomiting 10/17/2010  . Latex Rash 03/08/2010    ROS:  General: Negative for anorexia, weight loss, fever, chills, fatigue, weakness. ENT: Negative for hoarseness, difficulty swallowing , nasal congestion.see hpi CV: Negative for chest pain, angina, palpitations, dyspnea on exertion.+ peripheral edema.  Respiratory: Negative for dyspnea at rest,  cough, sputum, wheezing. +DOE GI: See history of present illness. GU:  Negative for dysuria, hematuria, urinary incontinence, urinary frequency, nocturnal urination.  Endo: Negative for unusual weight change. See hpi   Physical Examination:   BP 107/74  Pulse 94  Temp(Src) 97.9 F (36.6 C) (Oral)  Ht 5\' 6"  (1.676 m)  Wt 250 lb 9.6 oz (113.671 kg)  BMI 40.47 kg/m2  General: Well-nourished, well-developed in no acute distress. Obese. Eyes: No  icterus. Mouth: Oropharyngeal mucosa moist and pink , no lesions erythema or exudate. Lungs: Clear to auscultation bilaterally.  Heart: Regular rate and rhythm, no murmurs rubs or gallops.  Abdomen: Bowel sounds are normal, nontender, nondistended, no hepatosplenomegaly or masses, no abdominal bruits or hernia , no rebound or guarding.   Extremities: Trace to 1+ lower extremity edema. No clubbing or deformities. Neuro: Alert and oriented x 4   Skin: Warm and dry, no jaundice.   Psych: Alert and cooperative, normal mood and affect.

## 2012-06-20 NOTE — Assessment & Plan Note (Addendum)
Chronic GERD with water brash and sore throat. H/O medical noncompliance in the past. Reports no improvement on Dexilant. She continues to have significant weight gain. Increased weight of 30 pounds in the last two years. Up 13 lb in the last six months. She has limited physical activity and admits to poor eating habits. She has refractory GERD likely secondary to ongoing weight gain. She also has ongoing constipation issues with possible rectocele +/- pelvic floor dysfunction. Aggressively treat constipation.    1. Stop Dexilant. Start pantoprazole 40mg  one pill 30 minutes before breakfast daily.  2. Stop Amitiza. Start Linzess one pill 30 minutes before breakfast daily. 3. Dietician consult. 4. I have requested labs from your PCP. Once reviewed, I will let you know if you need any further labs. 5. Office visit in 6 weeks.

## 2012-06-20 NOTE — Progress Notes (Signed)
Cc PCP 

## 2012-06-20 NOTE — Patient Instructions (Addendum)
1. Stop Dexilant. Start pantoprazole 40mg  one pill 30 minutes before breakfast daily.  2. Stop Amitiza. Start Linzess one pill 30 minutes before breakfast daily. 3. Dietician consult. 4. I have requested labs from your PCP. Once reviewed, I will let you know if you need any further labs. 5. Office visit in 6 weeks.     Referral was faxed to [facility] the nutritionist at [facility] the patient is aware there is not a dietician in [place] to refer to right now

## 2012-06-21 ENCOUNTER — Telehealth (HOSPITAL_COMMUNITY): Payer: Self-pay | Admitting: Dietician

## 2012-06-21 NOTE — Telephone Encounter (Signed)
Called at 37 and spoke with pt husband. Pt is unavailable at this time.

## 2012-06-21 NOTE — Telephone Encounter (Signed)
Received referral from [facility] for dx: obesity. Noted that pt desires bariatric surgery.

## 2012-06-24 NOTE — Telephone Encounter (Signed)
Sent letter to pt home via US Mail in attempt to contact pt to schedule appointment.  

## 2012-06-26 ENCOUNTER — Telehealth (HOSPITAL_COMMUNITY): Payer: Self-pay | Admitting: Dietician

## 2012-06-26 NOTE — Progress Notes (Signed)
Spoke with pt- lab order in mail to her.

## 2012-06-26 NOTE — Telephone Encounter (Signed)
Pt called at 1450 and reports she has an appointment at 1000 that day with sleep disorders clinic. Appointment rescheduled for 07/08/12 at 1130.

## 2012-06-26 NOTE — Addendum Note (Signed)
Addended by: Tiffany Kocher on: 06/26/2012 08:19 AM   Modules accepted: Orders

## 2012-06-26 NOTE — Telephone Encounter (Signed)
Received voicemail from pt at 1356. Called back at 1444. Appointment scheduled for 07/08/12 at 1000.

## 2012-06-26 NOTE — Progress Notes (Signed)
See copy of labs from PCP in January 2014. Albumin 3.9, alkaline phosphatase 71, BUN 19, calcium 8.9, creatinine 1.25, glucose 96, SGOT 13, SGPT 10, total bilirubin 0.1, did not see a TSH on records received.  Please have patient get a TSH done. Orders entered.

## 2012-07-08 ENCOUNTER — Telehealth (HOSPITAL_COMMUNITY): Payer: Self-pay | Admitting: Dietician

## 2012-07-08 NOTE — Telephone Encounter (Signed)
Pt was a no-show for appointment scheduled for 07/08/2012 at 1130. Sent letter to pt home notifying pt of no-show and requesting rescheduling appointment.

## 2012-07-17 LAB — COMPREHENSIVE METABOLIC PANEL
ALT: 10 U/L (ref 7–35)
AST: 13 U/L
Albumin: 3.9
Calcium: 8.9 mg/dL

## 2012-07-18 ENCOUNTER — Telehealth (HOSPITAL_COMMUNITY): Payer: Self-pay | Admitting: Dietician

## 2012-07-18 NOTE — Telephone Encounter (Signed)
Received voicemail from Edwena Felty, DSS Caseworker for Ms. Rotolo. He is requesting arrangements to reschedule pt appointment from 07/08/12. Called back and left message on Mr. Elmore's voicemail at [phone].

## 2012-07-22 NOTE — Telephone Encounter (Signed)
No return call within 2 business days. Will close out.

## 2012-07-24 ENCOUNTER — Telehealth (HOSPITAL_COMMUNITY): Payer: Self-pay | Admitting: Dietician

## 2012-07-24 NOTE — Telephone Encounter (Signed)
Received voicemail from [employer] [facility] [place] DSS to reschedule pt appointment. Called and left message for Jennifer [name]. Offered appointment on 08/16/12 at 0900. Asked to call back and confirm.

## 2012-07-31 ENCOUNTER — Telehealth: Payer: Self-pay | Admitting: Internal Medicine

## 2012-07-31 NOTE — Telephone Encounter (Signed)
Called and gave Jennifer [name] the number for [facility]

## 2012-07-31 NOTE — Telephone Encounter (Signed)
Pt called to [facility] her OV to 7/15 and said that she needed to [facility] her dietary referral, but can not get in touch with anyone there to [facility]. Please advise

## 2012-08-01 ENCOUNTER — Ambulatory Visit: Payer: Medicaid Other | Admitting: Gastroenterology

## 2012-08-05 ENCOUNTER — Telehealth (HOSPITAL_COMMUNITY): Payer: Self-pay | Admitting: Dietician

## 2012-08-05 NOTE — Telephone Encounter (Signed)
Received voicemail from pt on 07/31/12 at 1335 re: rescheduling appointment. Also received staff message from [facility] on 07/31/12 at 1318 for same reason. Note telephone encounters on 07/18/12 and 07/24/12 with pt's caseworker from DSS, Bethann Berkshire, re: appointment reschedule. Pt tentatively scheduled for 08/16/12 at 0900, however, have not received confirmation from pt or caseworker at this time.

## 2012-08-05 NOTE — Telephone Encounter (Signed)
Returned pt call at 1457. She apologized for missing appointment due to death in the family. She informed this RD that caseworker has been out of the office, but will return next week. Informed her of appointment on 08/16/12. She reports she is unsure if she can make it due to transportation issues, but will see if her caseworker can get her there. She will let me know by 08/14/12. Informed pt that she is tentatively scheduled for 08/16/12 at 0900 and that this is the earliest available appointment.

## 2012-08-16 ENCOUNTER — Encounter (HOSPITAL_COMMUNITY): Payer: Self-pay | Admitting: Dietician

## 2012-08-16 ENCOUNTER — Telehealth (HOSPITAL_COMMUNITY): Payer: Self-pay | Admitting: Dietician

## 2012-08-16 NOTE — Progress Notes (Signed)
Received multiple notifications from front desk that pt is here for 1100 appointment. Pt was scheduled for 0900. Pt wants to be seen today. Offered brief consultation.

## 2012-08-16 NOTE — Telephone Encounter (Signed)
Pt on schedule for appointment 08/16/12 at 0900. Pt or caseworker did not confirm appointment and did not attend appointment. See previous telephone records for details.This is the second no-show in the past 6 weeks. Will keep referral on file.

## 2012-08-16 NOTE — Progress Notes (Signed)
Outpatient Initial Nutrition Assessment  Pt seen briefly as a walk-in  Date:08/16/2012   Appt Start Time: 1125  Referring Physician: [facility]  Reason for Visit: obesity  Nutrition Assessment:  Height: 5\' 6"  (167.6 cm)   Weight: 246 lb (111.585 kg)   IBW: 130# %IBW: 189% UBW: 250# %UBW: 98% Body mass index is 39.72 kg/(m^2). Meets criteria for obesity, class II.  Goal Weight: 221# (10% loss of current body weight) Weight hx: Pt reports highest weight was 300# prior to 1999. Her lowest weight was 115# in 2001. Noted fluctuations in weight. Pt's weight hx in interview inconsistencies; claimed she lost over 200# in 3 months in 2000.  Wt Readings from Last 10 Encounters:  08/16/12 246 lb (111.585 kg)  06/20/12 250 lb 9.6 oz (113.671 kg)  11/22/11 237 lb 6.4 oz (107.684 kg)  09/22/11 232 lb (105.235 kg)  09/21/11 228 lb 8 oz (103.647 kg)  09/17/11 223 lb (101.152 kg)  05/23/11 224 lb (101.606 kg)  04/24/11 224 lb (101.606 kg)  03/15/11 224 lb (101.606 kg)  02/21/11 223 lb 6.4 oz (101.334 kg)    Estimated nutritional needs: 1700-1800 kcals daily, 89-111 grams protein daily, 1.7-1.8 L fluid daily  PMH:  Past Medical History  Diagnosis Date  . Seizures   . PTSD (post-traumatic stress disorder)   . GERD (gastroesophageal reflux disease)   . Panic attack   . Asthma   . Anxiety   . Schizophrenia   . Bipolar 1 disorder   . S/P endoscopy Feb 2012    Nl, s/p 56-[facility] dilator, biopsy benign  . S/P endoscopy 2008    Mild gastritis  . History of flexible sigmoidoscopy 1999    Normal to 60cm,    Medications:  Current Outpatient Rx  Name  Route  Sig  Dispense  Refill  . albuterol (PROVENTIL HFA;VENTOLIN HFA) 108 (90 BASE) MCG/ACT inhaler   Inhalation   Inhale 2 puffs into the lungs every 6 (six) hours as needed for wheezing.         . cetirizine (ZYRTEC) 10 MG tablet   Oral   Take 10 mg by mouth every morning.         . citalopram (CELEXA) 40 MG  tablet   Oral   Take 40 mg by mouth daily.         . cyclobenzaprine (FLEXERIL) 5 MG tablet   Oral   Take 5 mg by mouth 3 (three) times daily as needed for muscle spasms.         Marland Kitchen estradiol (ESTRACE) 0.1 MG/GM vaginal cream   Vaginal   Place 2 g vaginally daily.         Marland Kitchen estradiol (VIVELLE-DOT) 0.05 MG/24HR   Transdermal   Place 1 patch onto the skin once a week. On Wednesdays         . fluticasone (FLONASE) 50 MCG/ACT nasal spray   Nasal   Place 2 sprays into the nose daily.         . furosemide (LASIX) 20 MG tablet   Oral   Take 20 mg by mouth 2 (two) times daily.         Marland Kitchen levETIRAcetam (KEPPRA) 750 MG tablet   Oral   Take 750 mg by mouth every 12 (twelve) hours. 3 tablets at QHS         . levofloxacin (LEVAQUIN) 500 MG tablet   Oral   Take 500 mg by mouth daily.         Marland Kitchen  Linaclotide (LINZESS) 145 MCG CAPS   Oral   Take 1 capsule (145 mcg total) by mouth daily.   30 capsule   11   . meloxicam (MOBIC) 15 MG tablet   Oral   Take 1 tablet (15 mg total) by mouth daily.   60 tablet   5   . naproxen (NAPROSYN) 500 MG tablet   Oral   Take 500 mg by mouth 2 (two) times daily with a meal.         . nitroGLYCERIN (NITROSTAT) 0.4 MG SL tablet   Sublingual   Place 0.4 mg under the tongue every 5 (five) minutes as needed. For chest pains         . paliperidone (INVEGA) 3 MG 24 hr tablet   Oral   Take 3 mg by mouth every morning.         . pantoprazole (PROTONIX) 40 MG tablet   Oral   Take 1 tablet (40 mg total) by mouth daily.   30 tablet   11   . pregabalin (LYRICA) 75 MG capsule   Oral   Take 75 mg by mouth 2 (two) times daily.         . QUEtiapine Fumarate (SEROQUEL XR) 150 MG 24 hr tablet   Oral   Take 150 mg by mouth at bedtime.           Labs: CMP     Component Value Date/Time   NA 143 05/18/2010 1609   K 4.1 02/23/2012   K 4.8 05/18/2010 1609   CL 107 05/18/2010 1609   CO2 25 05/18/2010 1609   GLUCOSE 88 05/18/2010  1609   BUN 19 02/23/2012   BUN 17 05/18/2010 1609   CREATININE 1.25 02/23/2012   CREATININE 1.03 02/23/2010 1444   CALCIUM 8.9 02/23/2012   CALCIUM 9.8 05/18/2010 1609   PROT 7.6 05/18/2010 1609   ALBUMIN 4.3 05/18/2010 1609   AST 13 02/23/2012   AST 13 05/18/2010 1609   ALT 10 02/23/2012   ALKPHOS 71 02/23/2012   ALKPHOS 75 05/18/2010 1609   BILITOT 0.1 02/23/2012   BILITOT 0.3 05/18/2010 1609   GFRNONAA 60* 02/23/2010 1444   GFRAA  Value: >60        The eGFR has been calculated using the MDRD equation. This calculation has not been validated in all clinical situations. eGFR's persistently <60 mL/min signify possible Chronic Kidney Disease. 02/23/2010 1444    Lipid Panel  No results found for this basename: chol, trig, hdl, cholhdl, vldl, ldlcalc     No results found for this basename: HGBA1C   Lab Results  Component Value Date   CREATININE 1.25 02/23/2012     Lifestyle/ social habits: Ms. Machi lives in [place] with her husband. She reports a stress level as 10/10, citing finances and depression as her main sources of stress. She is physically inactive. She "loses interest" in exercise quickly. The last time she exercised regularly back in 1999, when she was going to the gym daily.   Nutrition hx/habits: Ms. Cullin reports following a healthy diet is difficult for her, as she relies on food banks and food assistance programs. She does not have the resources to buy much food and is very limited in her selections. She reports receiving food from food banks, where she gets mainly cakes, cookies, rice, and noodles. She reports when she got $15/ month on food stamps she would only be able to get hot dogs and bread. Now that her  allowance has increased to $65 per month, she is able to purchase string beans, eggs, bologna, hot dogs, bottled water, and a pack of chicken. She goes without eating all day and often eats once large meal at dinner time. She reports "my stomach is never full". Her husband reports that  she is not active and often sleeps all day, eats dinner, and goes back to sleep.  She reports that she lost over 200# in a period of 3 months in 2000 by going to the gym daily and eating only canned tuna, rice, and water.  She is interested in bariatric surgery. She does not understand why she cannot lose weight when she only consumes one meal per day.    Nutrition Diagnosis: Disordered eating pattern r/t limited access to food AEB diet recall, food frequency.   Nutrition Intervention: Nutrition rx: 1500 kcal NAS, no added sugar daily; 3 meals per day; meals 4-5 hours apart; low calorie beverages; 30 minutes physical activity daily  Education/Counseling Provided: Educated pt on principles of weight management. Discussed principles of energy expenditure and how changes in diet and physical activity affect weight status. Had long discussion with pt about the pros and cons of pursuing bariatric surgery and emphasized that any type of weight loss requires lifestyle change. Gave pt emotional support, and empowered her to focus on the aspects of her lifestyle she could change regarding her diet and exercise regimen, particularly in the respect to portion control and physical activity. Discussed nutritional content of commonly eaten foods and suggested healthier alternatives. Educated pt on plate method and a general, healthful diet that includes low fat dairy, lean meats, whole fruits and vegetables, and whole grains most often. Had a long discussion about importance of a healthy diet along with regular physical activity (at least 30 minutes 5 times per week) to achieve weight loss goals. Encouraged slow, moderate weight loss (0.5-2# weight loss per week) and adopting healthy lifestyle changes vs. obtaining a certain body type or weight. Provided AND Nutrition Care Manual's "Weight Loss Tips" handout. Used TeachBack to assess understanding.  Also Provided extra copy "Weight Loss Tips" handout for pt caseworker,  per her request.  Understanding, Motivation, Ability to Follow Recommendations: Expect poor to fair compliance.   Monitoring and Evaluation: Goals: 1) 0.5-2# weight loss per week; 2) 30 minutes exercise daily (goal)  Recommendations: 1) Work with caseworker to seek out local food assistance; 2) Break up physical activity into smaller, more frequent sessions  F/U: PRN. RD contact information provided.   Melody Haver, RD, LDN 08/16/2012  Appt EndTime: 1204

## 2012-08-20 ENCOUNTER — Encounter: Payer: Self-pay | Admitting: Gastroenterology

## 2012-08-20 ENCOUNTER — Ambulatory Visit (INDEPENDENT_AMBULATORY_CARE_PROVIDER_SITE_OTHER): Payer: Medicaid Other | Admitting: Gastroenterology

## 2012-08-20 VITALS — BP 103/70 | HR 72 | Temp 98.4°F | Ht 68.0 in | Wt 253.0 lb

## 2012-08-20 DIAGNOSIS — K449 Diaphragmatic hernia without obstruction or gangrene: Secondary | ICD-10-CM

## 2012-08-20 DIAGNOSIS — R635 Abnormal weight gain: Secondary | ICD-10-CM

## 2012-08-20 DIAGNOSIS — K219 Gastro-esophageal reflux disease without esophagitis: Secondary | ICD-10-CM

## 2012-08-20 NOTE — Patient Instructions (Addendum)
1. Please have your blood work done. 2. X-ray of your stomach to further evaluate your symptoms. 3. Start exercising every day. Try to sustain exercise for 20-30 minutes at a time.  Serving Sizes What we call a serving size today is larger than it was in the past. A 1950s fast-food burger contained little more than 1 oz of meat, and a soft drink was 8 oz (1 cup). Today, a "quarter pounder" burger is at least 4 times that amount, and a 32 or 64 oz drink is not uncommon. A possible guide for eating when trying to lose weight is to eat about half as much as you normally do. Some estimates of serving sizes are:  1 Dairy serving:Individual container of yogurt (8 oz) or piece of cheese the size of your thumb (1 oz).  1 Grain serving: 1 slice of bread or  cup pasta.  1 Meat serving: The size of a deck of cards (3 oz).  1 Fruit serving: cup canned fruit or 1 medium fruit.  1 Vegetable serving:  cup of cooked or canned vegetables.  1 Fat serving:The size of 4 stacked dimes. Experts suggest spending 1 or 2 days measuring food portions you commonly eat. This will give you better practice at estimating serving sizes, and will also show whether you are eating an appropriate amount of food to meet your weight goals. If you find that you are eating more than you thought, try measuring your food for a few days so you can "reprogram" yourself to learn what makes a healthy portion for you. SUGGESTIONS FOR CONTROL  In restaurants, share entrees, or ask the waiter to put half the entre in a box or bag before you even touch it.    Order lunch-sized portions. Many restaurants serve 4 to 6 oz of meat at lunch, compared with 8 to 10 oz at dinner.  Split dessert or skip it all together. Have a piece of fruit when you get home.  At home, use smaller plates and bowls. It will look as if you are eating more.  Plate your food in the kitchen rather than serving it "family style" at the table.  Wait 20 to 30  minutes before taking seconds. This is how long it takes your brain to recognize that you are full.  Check food labels for serving sizes. Eat 1 serving only.  Use measuring cups and spoons to see proper serving sizes.  Buy smaller packages of candy, popcorn, and snacks.  Avoid eating directly out of the bag or carton.  While eating half as much, exercise twice as much. Park further away from the mall, take the stairs instead of the escalator, and walk around your block. Losing weight is a slow, difficult process. It takes long-lasting lifestyle changes. You can make gradual changes over time so they become habits. Look to friends and family to support the healthy changes you are making. Avoid fad diets since they are often only temporary weight loss solutions. Document Released: 10/22/2002 Document Revised: 04/17/2011 Document Reviewed: 12/01/2008 [facility] Patient Information 2014 [place], [place].

## 2012-08-20 NOTE — Progress Notes (Signed)
Cc PCP 

## 2012-08-20 NOTE — Progress Notes (Signed)
Please contact the dietician and find out if they can send patient a meal plan for weight loss purposes. I reviewed dietician note but patient states looking for plan that tells her specifically what to eat.

## 2012-08-20 NOTE — Assessment & Plan Note (Signed)
Chronic GERD/regurgitation. C/o spontaneous water brash and epigastric discomfort with meals. Continued weight gain. Limited physical activity. Unhealthy meal choices, per patient limited resources/funds. Constipation currently manageable on Amitiza.   1. Continue pantoprazole. 2. TSH. 3. UGI series to evaluate hiatal hernia.  4. We will contact dietician to see if a diet plan can be provided to patient.  5. Discussed with patient, gastric bypass is not going to "fix" everything. Unfortunately, she has psychosocial factors which will likely prevent her from having successful weight loss surgery/weight loss outcome. Discussed need to make dietary changes and increase her daily physical activity.

## 2012-08-20 NOTE — Progress Notes (Signed)
Primary Care Physician: Ernestine Conrad, MD  Primary Gastroenterologist:  Roetta Sessions, MD   Chief Complaint  Patient presents with  . Follow-up    HPI: Jennifer [name] is a 42 y.o. female here for f/u. She has h/o refractory GERD. Also with c/o constipation. Last EGD by Dr. Jena Gauss 03/2010 showed non-H.Pylori gastritis, moderate hh, empirically dilated with 59F Maloney dilator. Last TCS by Dr. Jena Gauss 02/2011 showed external hemorrhoids.   Patient previously denied improvement on Dexilant. We started pantoprazole at last visit. Tried to switch from Amitiza to Linzess but unable to get covered by Medicaid. Referred her to dietician for weight loss purposes. She was disappointed stating she did not receive a diet plan. She never had TSH done. Weight up 16 pounds since 11/2011. Up 30 pounds since 02/2011. Expresses frustration with not being able to afford healthy foods. Eats a lot of boxed/canned items she receives through assistance programs. States she exercises every day, but on recall she dances or moves arms/legs during commercial breaks only. No sustained exercise at all. Rarely leaves her home. Watches TV for the better part of the day.  Feels like food and liquids are sitting in her epigastrium. Yawn a little and acid comes up into mouth. Lots of belching. epigastric pain, sharp and radiates to LUQ. Intermittent, worse with meals. Never feels like gets full. BM every other day. No straining. No melena, brbpr. Discussed gastric bypass at length per patient's request.   Current Outpatient Prescriptions  Medication Sig Dispense Refill  . albuterol (PROVENTIL HFA;VENTOLIN HFA) 108 (90 BASE) MCG/ACT inhaler Inhale 2 puffs into the lungs every 6 (six) hours as needed for wheezing.      . cetirizine (ZYRTEC) 10 MG tablet Take 10 mg by mouth every morning.      . citalopram (CELEXA) 40 MG tablet Take 40 mg by mouth daily.      . cyclobenzaprine (FLEXERIL) 5 MG tablet Take 5 mg by mouth 3 (three) times daily  as needed for muscle spasms.      Marland Kitchen estradiol (ESTRACE) 0.1 MG/GM vaginal cream Place 2 g vaginally daily.      Marland Kitchen estradiol (VIVELLE-DOT) 0.05 MG/24HR Place 1 patch onto the skin once a week. On Wednesdays      . fluticasone (FLONASE) 50 MCG/ACT nasal spray Place 2 sprays into the nose daily.      . furosemide (LASIX) 20 MG tablet Take 20 mg by mouth 2 (two) times daily.      Marland Kitchen levETIRAcetam (KEPPRA) 750 MG tablet Take 750 mg by mouth every 12 (twelve) hours. 3 tablets at QHS      . lubiprostone (AMITIZA) 8 MCG capsule Take 8 mcg by mouth 2 (two) times daily with a meal.      . meloxicam (MOBIC) 15 MG tablet Take 1 tablet (15 mg total) by mouth daily.  60 tablet  5  . nitroGLYCERIN (NITROSTAT) 0.4 MG SL tablet Place 0.4 mg under the tongue every 5 (five) minutes as needed. For chest pains      . paliperidone (INVEGA) 3 MG 24 hr tablet Take 3 mg by mouth every morning.      . pantoprazole (PROTONIX) 40 MG tablet Take 1 tablet (40 mg total) by mouth daily.  30 tablet  11  . pregabalin (LYRICA) 75 MG capsule Take 75 mg by mouth 2 (two) times daily.      . QUEtiapine Fumarate (SEROQUEL XR) 150 MG 24 hr tablet Take 150 mg by mouth at bedtime.      Marland Kitchen  traZODone (DESYREL) 50 MG tablet Take 50 mg by mouth at bedtime.       No current facility-administered medications for this visit.    Allergies as of 08/20/2012 - Review Complete 08/20/2012  Allergen Reaction Noted  . Codeine Other (See Comments)   . Ivp dye (iodinated diagnostic agents) Nausea And Vomiting 10/17/2010  . Latex Rash 03/08/2010    ROS:  General: Negative for anorexia, weight loss, fever, chills, fatigue, weakness. ENT: Negative for hoarseness, difficulty swallowing , nasal congestion. CV: Negative for chest pain, angina, palpitations, dyspnea on exertion, peripheral edema.  Respiratory: Negative for dyspnea at rest, dyspnea on exertion, cough, sputum, wheezing.  GI: See history of present illness. GU:  Negative for dysuria,  hematuria, urinary incontinence, urinary frequency, nocturnal urination.  Endo: Negative for unusual weight change. See hpi.   Physical Examination:   BP 103/70  Pulse 72  Temp(Src) 98.4 F (36.9 C) (Oral)  Ht 5\' 8"  (1.727 m)  Wt 253 lb (114.76 kg)  BMI 38.48 kg/m2  General: Well-nourished, well-developed in no acute distress. Obese. Eyes: No icterus. Mouth: Oropharyngeal mucosa moist and pink , no lesions erythema or exudate. Lungs: Clear to auscultation bilaterally.  Heart: Regular rate and rhythm, no murmurs rubs or gallops.  Abdomen: Bowel sounds are normal, nontender, nondistended, no hepatosplenomegaly or masses, no abdominal bruits or hernia , no rebound or guarding.   Extremities: No lower extremity edema. No clubbing or deformities. Neuro: Alert and oriented x 4   Skin: Warm and dry, no jaundice.   Psych: Alert and cooperative, normal mood and affect.

## 2012-08-21 NOTE — Progress Notes (Signed)
Quick Note:  Please let pt know her TSH is normal. ______

## 2012-08-22 NOTE — Progress Notes (Signed)
LM with pt's spouse that blood work was normal.

## 2012-08-23 ENCOUNTER — Other Ambulatory Visit (HOSPITAL_COMMUNITY): Payer: Medicaid Other

## 2012-08-27 ENCOUNTER — Ambulatory Visit (HOSPITAL_COMMUNITY)
Admission: RE | Admit: 2012-08-27 | Discharge: 2012-08-27 | Disposition: A | Discharge status: Home / Self Care | Payer: Medicaid Other | Source: Ambulatory Visit | Attending: Gastroenterology | Admitting: Gastroenterology

## 2012-08-27 DIAGNOSIS — R1310 Dysphagia, unspecified: Secondary | ICD-10-CM | POA: Insufficient documentation

## 2012-08-27 DIAGNOSIS — R112 Nausea with vomiting, unspecified: Secondary | ICD-10-CM | POA: Insufficient documentation

## 2012-08-27 DIAGNOSIS — K219 Gastro-esophageal reflux disease without esophagitis: Secondary | ICD-10-CM

## 2012-08-27 DIAGNOSIS — K449 Diaphragmatic hernia without obstruction or gangrene: Secondary | ICD-10-CM | POA: Insufficient documentation

## 2012-08-28 NOTE — Progress Notes (Signed)
Quick Note:  Small sliding hiatal hernia. Evidence of refluxing in distal esophagus while standing.  This will be helped by weight loss.  I requested someone contact dietician at time of her last OV but nothing documented.   Please contact the dietician and find out if they can send patient a meal plan for weight loss purposes. I reviewed dietician note but patient states looking for plan that tells her specifically what to eat.    ______

## 2012-09-12 ENCOUNTER — Other Ambulatory Visit: Payer: Self-pay | Admitting: Urgent Care

## 2012-09-27 ENCOUNTER — Ambulatory Visit (INDEPENDENT_AMBULATORY_CARE_PROVIDER_SITE_OTHER): Payer: Medicaid Other | Admitting: Physician Assistant

## 2012-09-27 ENCOUNTER — Encounter: Payer: Self-pay | Admitting: Physician Assistant

## 2012-09-27 VITALS — BP 105/72 | HR 87 | Ht 66.0 in | Wt 252.0 lb

## 2012-09-27 DIAGNOSIS — R002 Palpitations: Secondary | ICD-10-CM

## 2012-09-27 DIAGNOSIS — R0789 Other chest pain: Secondary | ICD-10-CM

## 2012-09-27 NOTE — Assessment & Plan Note (Signed)
No further workup indicated. Patient had a relatively low risk exercise stress echocardiogram study, in August 2013. Patient's symptoms are quite atypical, and 12-lead EKG today indicates no definite ischemic changes. Recommend continued risk factor modification, with particular emphasis on smoking cessation.

## 2012-09-27 NOTE — Progress Notes (Signed)
Primary Cardiologist: Simona Huh, MD   HPI: Patient referred for evaluation of palpitations.   Patient presents with complaint of new onset tachycardia palpitations, which have been ongoing over the last several months, occurring several times a month. These can last as long as 10 minutes in duration, with some associated atypical chest pain characterized as sharp, fleeting, with associated facial numbness.  Patient does admit to drinking approximately 2 cups of coffee a day, but no tea. She does not eat chocolate. She also has recently resumed smoking.   12-lead EKG today, reviewed by me, indicates NSR 87 bpm; NL axis; NSST changes  Allergies  Allergen Reactions  . Codeine Other (See Comments)    unknown  . Ivp Dye [Iodinated Diagnostic Agents] Nausea And Vomiting  . Latex Rash    Current Outpatient Prescriptions  Medication Sig Dispense Refill  . albuterol (PROVENTIL) (2.5 MG/3ML) 0.083% nebulizer solution Take 2.5 mg by nebulization every 6 (six) hours as needed for wheezing.      Marland Kitchen AMITIZA 8 MCG capsule TAKE 1 CAPSULE TWICE DAILY WITH FOOD.  60 capsule  3  . cetirizine (ZYRTEC) 10 MG tablet Take 10 mg by mouth every morning.      . citalopram (CELEXA) 40 MG tablet Take 40 mg by mouth daily.      Marland Kitchen estradiol (ESTRACE) 0.1 MG/GM vaginal cream Place 2 g vaginally 3 (three) times a week.       . fluticasone (FLONASE) 50 MCG/ACT nasal spray Place 1 spray into the nose 2 (two) times daily.       . furosemide (LASIX) 20 MG tablet Take 20 mg by mouth 2 (two) times daily.      Marland Kitchen levETIRAcetam (KEPPRA) 750 MG tablet Take 2,250 mg by mouth at bedtime. 3 tablets at QHS      . meloxicam (MOBIC) 15 MG tablet Take 1 tablet (15 mg total) by mouth daily.  60 tablet  5  . nitroGLYCERIN (NITROSTAT) 0.4 MG SL tablet Place 0.4 mg under the tongue every 5 (five) minutes as needed. For chest pains      . paliperidone (INVEGA) 3 MG 24 hr tablet Take 6 mg by mouth at bedtime.       . pantoprazole  (PROTONIX) 40 MG tablet Take 1 tablet (40 mg total) by mouth daily.  30 tablet  11  . pregabalin (LYRICA) 75 MG capsule Take 75 mg by mouth 2 (two) times daily.      . QUEtiapine (SEROQUEL) 200 MG tablet Take 200 mg by mouth at bedtime.      . traZODone (DESYREL) 50 MG tablet Take 50 mg by mouth at bedtime.      . Vitamin D, Ergocalciferol, (DRISDOL) 50000 UNITS CAPS capsule Take 50,000 Units by mouth every 7 (seven) days.       No current facility-administered medications for this visit.    Past Medical History  Diagnosis Date  . Seizures   . PTSD (post-traumatic stress disorder)   . GERD (gastroesophageal reflux disease)   . Panic attack   . Asthma   . Anxiety   . Schizophrenia   . Bipolar 1 disorder   . S/P endoscopy Feb 2012    Nl, s/p 56-French [facility] dilator, biopsy benign  . S/P endoscopy 2008    Mild gastritis  . History of flexible sigmoidoscopy 1999    Normal to 60cm,    Past Surgical History  Procedure Laterality Date  . Partial hysterectomy    . Cholecystectomy    .  Tonsillectomy    . Hernia repair      Incisional with mesh  . Colonoscopy  03/01/2011    Rourk-External hemorrhoids/otherwise normal rectum and colon.    History   Social History  . Marital Status: Married    Spouse Name: N/A    Number of Children: N/A  . Years of Education: N/A   Occupational History  . disabled    Social History Main Topics  . Smoking status: Current Some Day Smoker -- 0.50 packs/day for 26 years    Types: Cigarettes  . Smokeless tobacco: Not on file  . Alcohol Use: No  . Drug Use: No  . Sexual Activity: Not on file   Other Topics Concern  . Not on file   Social History Narrative  . No narrative on file    Family History  Problem Relation Age of Onset  . Crohn's disease Maternal Aunt   . Breast cancer Maternal Grandmother   . Colon cancer Maternal Grandfather   . Pancreatic cancer Maternal Grandfather   . Crohn's disease Cousin     x 2 cousins  . Breast  cancer Daughter 26    ROS: no nausea, vomiting; no fever, chills; no melena, hematochezia; no claudication  PHYSICAL EXAM: BP 105/72  Pulse 87  Ht 5\' 6"  (1.676 m)  Wt 252 lb (114.306 kg)  BMI 40.69 kg/m2 GENERAL: 42 yo female, moderately obese; NAD HEENT: NCAT, PERRLA, EOMI; sclera clear; no xanthelasma NECK: No carotid bruits; unable to assess JVD, secondary to neck girth LUNGS: CTA bilaterally CARDIAC: RRR (S1, S2); no significant murmurs; no rubs or gallops ABDOMEN: soft, protuberant EXTREMETIES: Trace peripheral edema SKIN: warm/dry; no obvious rash/lesions MUSCULOSKELETAL: no joint deformity NEURO: no focal deficit; NL affect   EKG: reviewed and available in Electronic Records   ASSESSMENT & PLAN:  Palpitations Will evaluate further with a 21 day event monitor, to rule out dysrhythmia. In the meanwhile, we'll request recent lab work from Dr. Pauletta Browns office, particularly regarding electrolytes and TSH level. Patient has been advised to significantly curtail her caffeinated beverage intake, and to stop smoking. No current indication for medical therapy, pending review of monitor results. Of note, patient has documented history of NL LVF.  Atypical chest pain No further workup indicated. Patient had a relatively low risk exercise stress echocardiogram study, in August 2013. Patient's symptoms are quite atypical, and 12-lead EKG today indicates no definite ischemic changes. Recommend continued risk factor modification, with particular emphasis on smoking cessation.    Gene Lamarr Feenstra, PAC

## 2012-09-27 NOTE — Patient Instructions (Signed)
   Decrease caffeine Your physician has recommended that you wear an 21 day event monitor. Event monitors are medical devices that record the heart's electrical activity. Doctors most often [place] these monitors to diagnose arrhythmias. Arrhythmias are problems with the speed or rhythm of the heartbeat. The monitor is a small, portable device. You can wear one while you do your normal daily activities. This is usually used to diagnose what is causing palpitations/syncope (passing out). Continue all current medications. Follow up in  1 month

## 2012-09-27 NOTE — Assessment & Plan Note (Signed)
Will evaluate further with a 21 day event monitor, to rule out dysrhythmia. In the meanwhile, we'll request recent lab work from Dr. Pauletta Browns office, particularly regarding electrolytes and TSH level. Patient has been advised to significantly curtail her caffeinated beverage intake, and to stop smoking. No current indication for medical therapy, pending review of monitor results. Of note, patient has documented history of NL LVF.

## 2012-09-30 ENCOUNTER — Other Ambulatory Visit: Payer: Self-pay | Admitting: *Deleted

## 2012-09-30 DIAGNOSIS — R002 Palpitations: Secondary | ICD-10-CM

## 2012-10-01 DIAGNOSIS — R002 Palpitations: Secondary | ICD-10-CM

## 2012-10-03 ENCOUNTER — Telehealth: Payer: Self-pay | Admitting: *Deleted

## 2012-10-03 NOTE — Telephone Encounter (Signed)
Message copied by Lesle Chris on Thu Oct 03, 2012 10:27 AM ------      Message from: Prescott Parma C      Created: Wed Oct 02, 2012 12:50 PM       Most recent labs reviewed, notable for NL K and TSH. No further workup.            Thanks,            Gene            ----- Message -----         From: Lesle Chris, LPN         Sent: 09/30/2012   1:27 PM           To: Prescott Parma, PA-C            Please see most recent labs scanned into EPIC.  Received from Dr. Loney Hering.  Labs last done January 2014.  Requested per last OV on 09/27/2012.            Thanks      Inocencio Homes        ------

## 2012-11-04 ENCOUNTER — Ambulatory Visit: Payer: Medicaid Other | Admitting: Cardiovascular Disease

## 2012-11-21 ENCOUNTER — Ambulatory Visit: Payer: Medicaid Other | Admitting: Cardiovascular Disease

## 2012-11-26 ENCOUNTER — Encounter: Payer: Self-pay | Admitting: Cardiovascular Disease

## 2012-11-26 ENCOUNTER — Ambulatory Visit (INDEPENDENT_AMBULATORY_CARE_PROVIDER_SITE_OTHER): Payer: Medicaid Other | Admitting: Cardiovascular Disease

## 2012-11-26 VITALS — BP 100/73 | HR 99 | Ht 66.0 in | Wt 259.8 lb

## 2012-11-26 DIAGNOSIS — R002 Palpitations: Secondary | ICD-10-CM

## 2012-11-26 DIAGNOSIS — F17200 Nicotine dependence, unspecified, uncomplicated: Secondary | ICD-10-CM

## 2012-11-26 DIAGNOSIS — R000 Tachycardia, unspecified: Secondary | ICD-10-CM

## 2012-11-26 DIAGNOSIS — Z7182 Exercise counseling: Secondary | ICD-10-CM

## 2012-11-26 DIAGNOSIS — Z716 Tobacco abuse counseling: Secondary | ICD-10-CM

## 2012-11-26 DIAGNOSIS — R0789 Other chest pain: Secondary | ICD-10-CM

## 2012-11-26 DIAGNOSIS — Z7189 Other specified counseling: Secondary | ICD-10-CM

## 2012-11-26 NOTE — Progress Notes (Signed)
Patient ID: Jennifer [name], female   DOB: May 16, 1970, 42 y.o.   MRN: [id number]      SUBJECTIVE: Jennifer [name] is here to f/u for tachycardia/palpitations and atypical chest pain. Her event monitor showed sinus rhythm and sinus tachycardia, with no symptoms reported. There was no evidence of SVT or atrial fibrillation. She underwent what was deemed to be a low-risk stress echocardiogram on 10-03-11. She also has a h/o anxiety, schizophrenia, and bipolar disorder. She is on Lasix 20 mg bid as well.  She's had chest pains, primarily right-sided, since she was 42 yrs old. She began to experience them with greater intensity in her 30's. She's cut down to smoking 3 cigarettes daily, but is willing to quit. She gets chest tenderness and pains lasting seconds, aggravated by manual pressure.    Allergies  Allergen Reactions  . Codeine Other (See Comments)    unknown  . Ivp Dye [Iodinated Diagnostic Agents] Nausea And Vomiting  . Latex Rash    Current Outpatient Prescriptions  Medication Sig Dispense Refill  . albuterol (PROVENTIL) (2.5 MG/3ML) 0.083% nebulizer solution Take 2.5 mg by nebulization every 6 (six) hours as needed for wheezing.      Marland Kitchen AMITIZA 8 MCG capsule TAKE 1 CAPSULE TWICE DAILY WITH FOOD.  60 capsule  3  . amoxicillin (AMOXIL) 875 MG tablet Take 875 mg by mouth 2 (two) times daily.      . cetirizine (ZYRTEC) 10 MG tablet Take 10 mg by mouth every morning.      . citalopram (CELEXA) 40 MG tablet Take 40 mg by mouth daily.      . cyclobenzaprine (FLEXERIL) 5 MG tablet Take 5 mg by mouth 3 (three) times daily as needed for muscle spasms.      Marland Kitchen estradiol (ESTRACE) 0.1 MG/GM vaginal cream Place 2 g vaginally 3 (three) times a week.       . fluticasone (FLONASE) 50 MCG/ACT nasal spray Place 1 spray into the nose 2 (two) times daily.       . furosemide (LASIX) 20 MG tablet Take 20 mg by mouth 2 (two) times daily.      Marland Kitchen levETIRAcetam (KEPPRA) 750 MG tablet Take 2,250 mg by mouth at  bedtime. 3 tablets at QHS      . nitroGLYCERIN (NITROSTAT) 0.4 MG SL tablet Place 0.4 mg under the tongue every 5 (five) minutes as needed. For chest pains      . oxybutynin (DITROPAN) 5 MG tablet Take 5 mg by mouth 2 (two) times daily.      . paliperidone (INVEGA) 3 MG 24 hr tablet Take 6 mg by mouth at bedtime.       . pantoprazole (PROTONIX) 40 MG tablet Take 1 tablet (40 mg total) by mouth daily.  30 tablet  11  . pregabalin (LYRICA) 75 MG capsule Take 75 mg by mouth 2 (two) times daily.      . QUEtiapine (SEROQUEL) 200 MG tablet Take 200 mg by mouth at bedtime.      . traZODone (DESYREL) 50 MG tablet Take 50 mg by mouth at bedtime.      . Vitamin D, Ergocalciferol, (DRISDOL) 50000 UNITS CAPS capsule Take 50,000 Units by mouth every 7 (seven) days.       No current facility-administered medications for this visit.    Past Medical History  Diagnosis Date  . Seizures   . PTSD (post-traumatic stress disorder)   . GERD (gastroesophageal reflux disease)   . Panic attack   .  Asthma   . Anxiety   . Schizophrenia   . Bipolar 1 disorder   . S/P endoscopy Feb 2012    Nl, s/p 56-French [facility] dilator, biopsy benign  . S/P endoscopy 2008    Mild gastritis  . History of flexible sigmoidoscopy 1999    Normal to 60cm,    Past Surgical History  Procedure Laterality Date  . Partial hysterectomy    . Cholecystectomy    . Tonsillectomy    . Hernia repair      Incisional with mesh  . Colonoscopy  03/01/2011    Rourk-External hemorrhoids/otherwise normal rectum and colon.    History   Social History  . Marital Status: Married    Spouse Name: N/A    Number of Children: N/A  . Years of Education: N/A   Occupational History  . disabled    Social History Main Topics  . Smoking status: Current Some Day Smoker -- 0.25 packs/day for 26 years    Types: Cigarettes  . Smokeless tobacco: Not on file     Comment: 3 cig daily as of 11-26-12  . Alcohol Use: No  . Drug Use: No  . Sexual  Activity: Not on file   Other Topics Concern  . Not on file   Social History Narrative  . No narrative on file     Filed Vitals:   11/26/12 0823  Height: 5\' 6"  (1.676 m)  Weight: 259 lb 12 oz (117.822 kg)   BP 100/73 Pulse 99   PHYSICAL EXAM General: NAD, obese, flat affect Neck: No JVD, no thyromegaly or thyroid nodule.  Lungs: Clear to auscultation bilaterally with normal respiratory effort. CV: Nondisplaced PMI.  Tenderness to palpation over the right 2nd intercostal space, with radiation to the right axilla only with palpation. Heart regular S1/S2, no S3/S4, no murmur.  No peripheral edema.  No carotid bruit.  Normal pedal pulses.  Abdomen: Soft, nontender, no hepatosplenomegaly, no distention.  Neurologic: Alert and oriented x 3.  Psych: Normal affect. Extremities: No clubbing or cyanosis.   ECG: reviewed and available in electronic records. (sinus rhythm, 98 bpm, nonspecific T wave abnormality)      ASSESSMENT AND PLAN: 1. Palpitations: likely related to cardiovascular deconditioning, as her sinus rate remains elevated and quickly accelerates with minimal exertion. In addition to this, she is likely intravascularly volume depleted. I would recommend discontinuing Lasix. Again, there was no evidence of SVT on her event monitor. 2. Chest pain: atypical, and provoked by manual pressure. Likely related to costochondral inflammation and possibly fibromyalgia. I do not recommend any further cardiac testing. 3. Tobacco cessation: extensive counseling given. She appears to be willing to quit. 4. Exercise counseling also provided.  Dispo: f/u prn.   Prentice Docker, M.D., F.A.C.C.

## 2012-11-26 NOTE — Patient Instructions (Addendum)
Your physician recommends that you schedule a follow-up appointment in: AS NEEDED  Your physician has recommended you make the following change in your medication:   1) STOP TAKING LASIX (FUROSEMIDE)

## 2012-12-03 ENCOUNTER — Emergency Department (HOSPITAL_COMMUNITY)
Admission: EM | Admit: 2012-12-03 | Discharge: 2012-12-03 | Disposition: A | Discharge status: Home / Self Care | Payer: Medicaid Other | Attending: Emergency Medicine | Admitting: Emergency Medicine

## 2012-12-03 ENCOUNTER — Telehealth: Payer: Self-pay | Admitting: *Deleted

## 2012-12-03 ENCOUNTER — Encounter (HOSPITAL_COMMUNITY): Payer: Self-pay | Admitting: Emergency Medicine

## 2012-12-03 DIAGNOSIS — F319 Bipolar disorder, unspecified: Secondary | ICD-10-CM | POA: Insufficient documentation

## 2012-12-03 DIAGNOSIS — F4310 Post-traumatic stress disorder, unspecified: Secondary | ICD-10-CM | POA: Insufficient documentation

## 2012-12-03 DIAGNOSIS — H539 Unspecified visual disturbance: Secondary | ICD-10-CM | POA: Insufficient documentation

## 2012-12-03 DIAGNOSIS — Z9889 Other specified postprocedural states: Secondary | ICD-10-CM | POA: Insufficient documentation

## 2012-12-03 DIAGNOSIS — I959 Hypotension, unspecified: Secondary | ICD-10-CM

## 2012-12-03 DIAGNOSIS — IMO0002 Reserved for concepts with insufficient information to code with codable children: Secondary | ICD-10-CM | POA: Insufficient documentation

## 2012-12-03 DIAGNOSIS — R0602 Shortness of breath: Secondary | ICD-10-CM | POA: Insufficient documentation

## 2012-12-03 DIAGNOSIS — R002 Palpitations: Secondary | ICD-10-CM | POA: Insufficient documentation

## 2012-12-03 DIAGNOSIS — F17200 Nicotine dependence, unspecified, uncomplicated: Secondary | ICD-10-CM | POA: Insufficient documentation

## 2012-12-03 DIAGNOSIS — Z79899 Other long term (current) drug therapy: Secondary | ICD-10-CM | POA: Insufficient documentation

## 2012-12-03 DIAGNOSIS — Z792 Long term (current) use of antibiotics: Secondary | ICD-10-CM | POA: Insufficient documentation

## 2012-12-03 DIAGNOSIS — R51 Headache: Secondary | ICD-10-CM | POA: Insufficient documentation

## 2012-12-03 DIAGNOSIS — G40909 Epilepsy, unspecified, not intractable, without status epilepticus: Secondary | ICD-10-CM | POA: Insufficient documentation

## 2012-12-03 DIAGNOSIS — F410 Panic disorder [episodic paroxysmal anxiety] without agoraphobia: Secondary | ICD-10-CM | POA: Insufficient documentation

## 2012-12-03 DIAGNOSIS — F209 Schizophrenia, unspecified: Secondary | ICD-10-CM | POA: Insufficient documentation

## 2012-12-03 DIAGNOSIS — Z91040 Latex allergy status: Secondary | ICD-10-CM | POA: Insufficient documentation

## 2012-12-03 DIAGNOSIS — K219 Gastro-esophageal reflux disease without esophagitis: Secondary | ICD-10-CM | POA: Insufficient documentation

## 2012-12-03 DIAGNOSIS — J45909 Unspecified asthma, uncomplicated: Secondary | ICD-10-CM | POA: Insufficient documentation

## 2012-12-03 DIAGNOSIS — R42 Dizziness and giddiness: Secondary | ICD-10-CM | POA: Insufficient documentation

## 2012-12-03 LAB — BASIC METABOLIC PANEL
BUN: 11 mg/dL (ref 6–23)
CO2: 25 mEq/L (ref 19–32)
Calcium: 9.4 mg/dL (ref 8.4–10.5)
Creatinine, Ser: 1.1 mg/dL (ref 0.50–1.10)
GFR calc Af Amer: 71 mL/min — ABNORMAL LOW (ref >90)
Glucose, Bld: 95 mg/dL (ref 70–99)
Sodium: 136 mEq/L (ref 135–145)

## 2012-12-03 LAB — CBC WITH DIFFERENTIAL/PLATELET
Eosinophils Relative: 1 % (ref 0–5)
HCT: 37.5 % (ref 36.0–46.0)
Hemoglobin: 12.2 g/dL (ref 12.0–15.0)
Lymphocytes Relative: 44 % (ref 12–46)
Lymphs Abs: 3.1 10*3/uL (ref 0.7–4.0)
MCV: 72.7 fL — ABNORMAL LOW (ref 78.0–100.0)
Monocytes Absolute: 0.6 10*3/uL (ref 0.1–1.0)
Monocytes Relative: 8 % (ref 3–12)
Platelets: 261 10*3/uL (ref 150–400)
RBC: 5.16 MIL/uL — ABNORMAL HIGH (ref 3.87–5.11)
RDW: 15.9 % — ABNORMAL HIGH (ref 11.5–15.5)
WBC: 6.9 10*3/uL (ref 4.0–10.5)

## 2012-12-03 MED ORDER — SODIUM CHLORIDE 0.9 % IV BOLUS (SEPSIS)
1000.0000 mL | Freq: Once | INTRAVENOUS | Status: AC
  Administered 2012-12-03: 1000 mL via INTRAVENOUS

## 2012-12-03 NOTE — Telephone Encounter (Signed)
Pt was seen in the dental office today per extractions however unable to perform due to pt current sxs noted as low BP 99/74 HR 65, 95/61 HR 81, pt sxs started 2 hours ago with fatigue, SOB, heart fluttering, migraine and dizzyness noted, Dr. Purvis Sheffield gave verbal instructions to have pt evaluated in the ED, this nurse transported pt via wheelchair to the ED for evaluation, pt did confirm she has stopped Lasix as of 11-26-12 as advised, pt left in care of husband in triage waiting room for next to be called by triage nurse

## 2012-12-03 NOTE — ED Notes (Signed)
Pt seen at dentist. Pt started seeing spots and having dizziness. bp was checked there and was 99/74. Was sent here. Pt c/o being really tired, "woozy", c/o dizziness and chest fluttering intermittent. Pt moving slowly. Alert/oriented. C/o "migraine" also. Arm drift negative. Smile symmetrical.

## 2012-12-03 NOTE — ED Provider Notes (Signed)
CSN: [id number]     Arrival date & time 12/03/12  1207 History   This chart was scribed for Donnetta Hutching, MD, by Yevette Edwards, ED Scribe. This patient was seen in room APA14/APA14 and the patient's care was started at 1:39 PM.  First MD Initiated Contact with Patient 12/03/12 1315     Chief Complaint  Patient presents with  . Weakness  . Headache   (Consider location/radiation/quality/duration/timing/severity/associated sxs/prior Treatment) The history is provided by the patient. No language interpreter was used.   HPI Comments: Jennifer [name] is a 42 y.o. female, with a h/o schizophrenia and anxiety, who presents to the Emergency Department complaining of gradually increasing dizziness which 6 hours ago this morning. The pt was at the dentist's when an episode of dizziness occurred and her vitals were 65 (pulse) and 99/64(BP); the dentist encouraged the pt to come to the ED. She states she normally has low blood pressure, and she does not take blood pressure medication. The pt also visited the neurologist today because this morning she had increased fatigue, dizziness, visual disturbances, mild chest pain, heart palpitations, and intermittent SOB. She states that yesterday she felt fine, and all the symptoms began this morning.  The pt is a current smoker.   Past Medical History  Diagnosis Date  . Seizures   . PTSD (post-traumatic stress disorder)   . GERD (gastroesophageal reflux disease)   . Panic attack   . Asthma   . Anxiety   . Schizophrenia   . Bipolar 1 disorder   . S/P endoscopy Feb 2012    Nl, s/p 56-French [facility] dilator, biopsy benign  . S/P endoscopy 2008    Mild gastritis  . History of flexible sigmoidoscopy 1999    Normal to 60cm,   Past Surgical History  Procedure Laterality Date  . Partial hysterectomy    . Cholecystectomy    . Tonsillectomy    . Hernia repair      Incisional with mesh  . Colonoscopy  03/01/2011    Rourk-External hemorrhoids/otherwise normal  rectum and colon.   Family History  Problem Relation Age of Onset  . Crohn's disease Maternal Aunt   . Breast cancer Maternal Grandmother   . Colon cancer Maternal Grandfather   . Pancreatic cancer Maternal Grandfather   . Crohn's disease Cousin     x 2 cousins  . Breast cancer Daughter 57   History  Substance Use Topics  . Smoking status: Current Some Day Smoker -- 0.25 packs/day for 26 years    Types: Cigarettes  . Smokeless tobacco: Not on file     Comment: 3 cig daily as of 11-26-12  . Alcohol Use: No   No OB history provided.  Review of Systems  Constitutional: Positive for fatigue. Negative for fever and chills.  Eyes: Positive for visual disturbance.  Respiratory: Positive for shortness of breath.   Cardiovascular: Positive for chest pain and palpitations.  Neurological: Positive for dizziness.    Allergies  Codeine; Hydrocodone; Ivp dye; and Latex  Home Medications   Current Outpatient Rx  Name  Route  Sig  Dispense  Refill  . albuterol (PROVENTIL) (2.5 MG/3ML) 0.083% nebulizer solution   Nebulization   Take 2.5 mg by nebulization every 6 (six) hours as needed for wheezing.         Marland Kitchen AMITIZA 8 MCG capsule      TAKE 1 CAPSULE TWICE DAILY WITH FOOD.   60 capsule   3   . amoxicillin (AMOXIL)  875 MG tablet   Oral   Take 875 mg by mouth 2 (two) times daily.         . cetirizine (ZYRTEC) 10 MG tablet   Oral   Take 10 mg by mouth every morning.         . citalopram (CELEXA) 40 MG tablet   Oral   Take 40 mg by mouth daily.         . cyclobenzaprine (FLEXERIL) 5 MG tablet   Oral   Take 5 mg by mouth 3 (three) times daily as needed for muscle spasms.         Marland Kitchen estradiol (ESTRACE) 0.1 MG/GM vaginal cream   Vaginal   Place 2 g vaginally 3 (three) times a week.          . fluticasone (FLONASE) 50 MCG/ACT nasal spray   Nasal   Place 1 spray into the nose 2 (two) times daily.          Marland Kitchen levETIRAcetam (KEPPRA) 750 MG tablet   Oral   Take  2,250 mg by mouth at bedtime. 3 tablets at QHS         . nitroGLYCERIN (NITROSTAT) 0.4 MG SL tablet   Sublingual   Place 0.4 mg under the tongue every 5 (five) minutes as needed. For chest pains         . oxybutynin (DITROPAN) 5 MG tablet   Oral   Take 5 mg by mouth 2 (two) times daily.         . paliperidone (INVEGA) 3 MG 24 hr tablet   Oral   Take 6 mg by mouth at bedtime.          . pantoprazole (PROTONIX) 40 MG tablet   Oral   Take 1 tablet (40 mg total) by mouth daily.   30 tablet   11   . pregabalin (LYRICA) 75 MG capsule   Oral   Take 75 mg by mouth 2 (two) times daily.         . QUEtiapine (SEROQUEL) 200 MG tablet   Oral   Take 200 mg by mouth at bedtime.         . traZODone (DESYREL) 50 MG tablet   Oral   Take 50 mg by mouth at bedtime.         . Vitamin D, Ergocalciferol, (DRISDOL) 50000 UNITS CAPS capsule   Oral   Take 50,000 Units by mouth every 7 (seven) days.          Triage Vitals: BP 103/65  Pulse 69  Temp(Src) 98 F (36.7 C) (Oral)  Resp 24  Ht 5\' 6"  (1.676 m)  Wt 259 lb (117.482 kg)  BMI 41.82 kg/m2  SpO2 99%  Physical Exam  Nursing note and vitals reviewed. Constitutional: She is oriented to person, place, and time. She appears well-developed and well-nourished. No distress.  HENT:  Head: Normocephalic and atraumatic.  Eyes: Conjunctivae and EOM are normal. Pupils are equal, round, and reactive to light.  Neck: Normal range of motion. Neck supple.  Cardiovascular: Normal rate, regular rhythm and normal heart sounds.   Pulmonary/Chest: Effort normal and breath sounds normal.  Abdominal: Soft. Bowel sounds are normal.  Musculoskeletal: Normal range of motion.  Neurological: She is alert and oriented to person, place, and time.  Skin: Skin is warm and dry.  Psychiatric: She has a normal mood and affect.    ED Course  Procedures (including critical care time)  DIAGNOSTIC STUDIES: Oxygen  Saturation is 99% on room air,  normal by my interpretation.    COORDINATION OF CARE:  1:47 PM- Discussed treatment plan with patient, and the patient agreed to the plan.   Labs Review Labs Reviewed  CBC WITH DIFFERENTIAL - Abnormal; Notable for the following:    RBC 5.16 (*)    MCV 72.7 (*)    MCH 23.6 (*)    RDW 15.9 (*)    All other components within normal limits  BASIC METABOLIC PANEL - Abnormal; Notable for the following:    GFR calc non Af Amer 61 (*)    GFR calc Af Amer 71 (*)    All other components within normal limits  GLUCOSE, CAPILLARY  TROPONIN I   Imaging Review No results found.  EKG Interpretation     Ventricular Rate:  69 PR Interval:  166 QRS Duration: 80 QT Interval:  430 QTC Calculation: 460 R Axis:   50 Text Interpretation:  Normal sinus rhythm Nonspecific T wave abnormality Prolonged QT Abnormal ECG When compared with ECG of 23-Feb-2010 15:52, No significant change was found            MDM  No diagnosis found. Patient normally reports low blood pressure.  She is in no acute distress.  No specific worrisome c/o  I personally performed the services described in this documentation, which was scribed in my presence. The recorded information has been reviewed and is accurate.    Donnetta Hutching, MD 12/03/12 [phone]

## 2012-12-03 NOTE — ED Notes (Signed)
Pt presents to ED secondary to dentist referral d/t dizziness, light headedness and seeing spots before her eyes with a sudden onset headache. Pt reports that this has happened before, but " nothing was found".  Pt states did not eat before dental appt. CBG 90.  No acute distress noted at this time.  Stroke scale WNL.

## 2012-12-10 ENCOUNTER — Other Ambulatory Visit: Payer: Self-pay | Admitting: Gastroenterology

## 2013-01-27 ENCOUNTER — Other Ambulatory Visit (HOSPITAL_COMMUNITY): Payer: Self-pay | Admitting: Family Medicine

## 2013-01-27 DIAGNOSIS — Z139 Encounter for screening, unspecified: Secondary | ICD-10-CM

## 2013-01-28 ENCOUNTER — Telehealth (HOSPITAL_COMMUNITY): Payer: Self-pay | Admitting: Dietician

## 2013-01-28 NOTE — Telephone Encounter (Signed)
Received incoming call from [place] at [facility] at [phone]. Pt needs appointment for dx: elevated insulin, obesity, prediabetes. Noted pt has been seen here in the past (last appointment 08/16/12). Appointment scheduled for 02/19/13 at 1000.

## 2013-02-04 ENCOUNTER — Ambulatory Visit (HOSPITAL_COMMUNITY)
Admission: RE | Admit: 2013-02-04 | Discharge: 2013-02-04 | Disposition: A | Discharge status: Home / Self Care | Payer: Medicaid Other | Source: Ambulatory Visit | Attending: Family Medicine | Admitting: Family Medicine

## 2013-02-04 DIAGNOSIS — Z1231 Encounter for screening mammogram for malignant neoplasm of breast: Secondary | ICD-10-CM | POA: Insufficient documentation

## 2013-02-04 DIAGNOSIS — Z139 Encounter for screening, unspecified: Secondary | ICD-10-CM

## 2013-02-08 ENCOUNTER — Emergency Department (HOSPITAL_COMMUNITY)
Admission: EM | Admit: 2013-02-08 | Discharge: 2013-02-08 | Disposition: A | Discharge status: Home / Self Care | Payer: Medicaid Other | Attending: Emergency Medicine | Admitting: Emergency Medicine

## 2013-02-08 ENCOUNTER — Encounter (HOSPITAL_COMMUNITY): Payer: Self-pay | Admitting: Emergency Medicine

## 2013-02-08 DIAGNOSIS — Z791 Long term (current) use of non-steroidal anti-inflammatories (NSAID): Secondary | ICD-10-CM | POA: Insufficient documentation

## 2013-02-08 DIAGNOSIS — R569 Unspecified convulsions: Secondary | ICD-10-CM

## 2013-02-08 DIAGNOSIS — J45909 Unspecified asthma, uncomplicated: Secondary | ICD-10-CM | POA: Insufficient documentation

## 2013-02-08 DIAGNOSIS — Z91040 Latex allergy status: Secondary | ICD-10-CM | POA: Insufficient documentation

## 2013-02-08 DIAGNOSIS — Z9889 Other specified postprocedural states: Secondary | ICD-10-CM | POA: Insufficient documentation

## 2013-02-08 DIAGNOSIS — E119 Type 2 diabetes mellitus without complications: Secondary | ICD-10-CM | POA: Insufficient documentation

## 2013-02-08 DIAGNOSIS — R51 Headache: Secondary | ICD-10-CM | POA: Insufficient documentation

## 2013-02-08 DIAGNOSIS — F410 Panic disorder [episodic paroxysmal anxiety] without agoraphobia: Secondary | ICD-10-CM | POA: Insufficient documentation

## 2013-02-08 DIAGNOSIS — Z792 Long term (current) use of antibiotics: Secondary | ICD-10-CM | POA: Insufficient documentation

## 2013-02-08 DIAGNOSIS — F209 Schizophrenia, unspecified: Secondary | ICD-10-CM | POA: Insufficient documentation

## 2013-02-08 DIAGNOSIS — R5383 Other fatigue: Secondary | ICD-10-CM | POA: Insufficient documentation

## 2013-02-08 DIAGNOSIS — F29 Unspecified psychosis not due to a substance or known physiological condition: Secondary | ICD-10-CM | POA: Insufficient documentation

## 2013-02-08 DIAGNOSIS — R5381 Other malaise: Secondary | ICD-10-CM | POA: Insufficient documentation

## 2013-02-08 DIAGNOSIS — K219 Gastro-esophageal reflux disease without esophagitis: Secondary | ICD-10-CM | POA: Insufficient documentation

## 2013-02-08 DIAGNOSIS — R42 Dizziness and giddiness: Secondary | ICD-10-CM | POA: Insufficient documentation

## 2013-02-08 DIAGNOSIS — F4310 Post-traumatic stress disorder, unspecified: Secondary | ICD-10-CM | POA: Insufficient documentation

## 2013-02-08 DIAGNOSIS — F17200 Nicotine dependence, unspecified, uncomplicated: Secondary | ICD-10-CM | POA: Insufficient documentation

## 2013-02-08 DIAGNOSIS — Z79899 Other long term (current) drug therapy: Secondary | ICD-10-CM | POA: Insufficient documentation

## 2013-02-08 DIAGNOSIS — IMO0002 Reserved for concepts with insufficient information to code with codable children: Secondary | ICD-10-CM | POA: Insufficient documentation

## 2013-02-08 DIAGNOSIS — G40909 Epilepsy, unspecified, not intractable, without status epilepticus: Secondary | ICD-10-CM | POA: Insufficient documentation

## 2013-02-08 DIAGNOSIS — F319 Bipolar disorder, unspecified: Secondary | ICD-10-CM | POA: Insufficient documentation

## 2013-02-08 HISTORY — DX: Type 2 diabetes mellitus without complications: E11.9

## 2013-02-08 LAB — CBC WITH DIFFERENTIAL/PLATELET
BASOS ABS: 0 10*3/uL (ref 0.0–0.1)
Basophils Relative: 0 % (ref 0–1)
EOS ABS: 0.1 10*3/uL (ref 0.0–0.7)
Eosinophils Relative: 1 % (ref 0–5)
HCT: 36.5 % (ref 36.0–46.0)
HEMOGLOBIN: 11.7 g/dL — AB (ref 12.0–15.0)
Lymphocytes Relative: 43 % (ref 12–46)
Lymphs Abs: 2.7 10*3/uL (ref 0.7–4.0)
MCH: 23.3 pg — ABNORMAL LOW (ref 26.0–34.0)
MCHC: 32.1 g/dL (ref 30.0–36.0)
MCV: 72.6 fL — ABNORMAL LOW (ref 78.0–100.0)
MONOS PCT: 10 % (ref 3–12)
Monocytes Absolute: 0.6 10*3/uL (ref 0.1–1.0)
NEUTROS ABS: 2.9 10*3/uL (ref 1.7–7.7)
Neutrophils Relative %: 46 % (ref 43–77)
PLATELETS: 280 10*3/uL (ref 150–400)
RBC: 5.03 MIL/uL (ref 3.87–5.11)
RDW: 15.4 % (ref 11.5–15.5)
WBC: 6.3 10*3/uL (ref 4.0–10.5)

## 2013-02-08 LAB — BASIC METABOLIC PANEL
BUN: 19 mg/dL (ref 6–23)
CHLORIDE: 105 meq/L (ref 96–112)
CO2: 23 mEq/L (ref 19–32)
Calcium: 9.3 mg/dL (ref 8.4–10.5)
Creatinine, Ser: 1.2 mg/dL — ABNORMAL HIGH (ref 0.50–1.10)
GFR, EST AFRICAN AMERICAN: 64 mL/min — AB (ref >90)
GFR, EST NON AFRICAN AMERICAN: 55 mL/min — AB (ref >90)
Glucose, Bld: 109 mg/dL — ABNORMAL HIGH (ref 70–99)
POTASSIUM: 3.8 meq/L (ref 3.7–5.3)
Sodium: 140 mEq/L (ref 137–147)

## 2013-02-08 MED ORDER — SODIUM CHLORIDE 0.9 % IV BOLUS (SEPSIS)
500.0000 mL | Freq: Once | INTRAVENOUS | Status: AC
  Administered 2013-02-08: 500 mL via INTRAVENOUS

## 2013-02-08 NOTE — ED Notes (Signed)
Pt hx of seizures. Last one 5 years ago per husband/EMS. cbg in [address]. Pt arrived alert/oriented to most. Slightly lethargic. Does not appear post ictal. Eyes open spontaneous. Per ems family caught pt as going down so did not fall but pt c/o throbbing pain to back of head. No bumps found. Pt states she remembers smoking outside, got up to go inside because felt dizzy and that's all she remembers.

## 2013-02-08 NOTE — ED Provider Notes (Addendum)
CSN: [id number]     Arrival date & time 02/08/13  1257 History  This chart was scribed for [employer]. Alvino Chapel, MD,  by Stacy Gardner, ED Scribe. The patient was seen in room APA07/APA07 and the patient's care was started at 2:30 PM.    First MD Initiated Contact with Patient 02/08/13 1422     Chief Complaint  Patient presents with  . Seizures   (Consider location/radiation/quality/duration/timing/severity/associated sxs/prior Treatment) The history is provided by medical records and the patient. No language interpreter was used.   HPI Comments: Jennifer [name] is a 43 y.o. female who presents to the Emergency Department via EMS complaining of seizure that occurred PTA. She is slightly lethargic. While outside smoke she began to feel light headed. She then stood up to walk indoors and woke up on the ground. Her family member broke her fall and she did not hit her head. Per family her eye rolled to the back of her head and he denies shaking.She states the back of her head is throbbing like a "migraine". Per family denies head injury. Her last seizure was three months ago.Pt mentions feeling "confused" after the episode which is different from her previous seizures. She is currently taking 750 mg of Kepra ( 3 capsules a day). Pt states she is eating smaller portions to gain control of her weight. She denies prior dizziness but states feeling light headed prior to today.She denies any significant weight loss.  Pt currently just started taking Metformin HCL . She states feeling "strange" currently.  Pt currently smoks 0.25 packs/ days for the past 26 years. Per family reports she was upset prior to today's episodes. She is scheduled to visit her neurologist in one week.  Past Medical History  Diagnosis Date  . Seizures   . PTSD (post-traumatic stress disorder)   . GERD (gastroesophageal reflux disease)   . Panic attack   . Asthma   . Anxiety   . Schizophrenia   . Bipolar 1 disorder   . S/P  endoscopy Feb 2012    Nl, s/p 56-[facility] dilator, biopsy benign  . S/P endoscopy 2008    Mild gastritis  . History of flexible sigmoidoscopy 1999    Normal to 60cm,  . Diabetes mellitus without complication    Past Surgical History  Procedure Laterality Date  . Partial hysterectomy    . Cholecystectomy    . Tonsillectomy    . Hernia repair      Incisional with mesh  . Colonoscopy  03/01/2011    Rourk-External hemorrhoids/otherwise normal rectum and colon.   Family History  Problem Relation Age of Onset  . Crohn's disease Maternal Aunt   . Breast cancer Maternal Grandmother   . Colon cancer Maternal Grandfather   . Pancreatic cancer Maternal Grandfather   . Crohn's disease Cousin     x 2 cousins  . Breast cancer Daughter 42   History  Substance Use Topics  . Smoking status: Current Some Day Smoker -- 0.25 packs/day for 26 years    Types: Cigarettes  . Smokeless tobacco: Not on file     Comment: 3 cig daily as of 11-26-12  . Alcohol Use: Yes     Comment: occa   OB History   Grav Para Term Preterm Abortions TAB SAB Ect Mult Living                 Review of Systems  Constitutional: Positive for fatigue. Negative for unexpected weight change.  Neurological:  Positive for dizziness, seizures and headaches. Negative for light-headedness.  Psychiatric/Behavioral: Positive for confusion.  All other systems reviewed and are negative.    Allergies  Codeine; Hydrocodone; Ivp dye; and Latex  Home Medications   Current Outpatient Rx  Name  Route  Sig  Dispense  Refill  . AMITIZA 8 MCG capsule      TAKE 1 CAPSULE BY MOUTH TWICE DAILY.   60 capsule   11   . amoxicillin (AMOXIL) 875 MG tablet   Oral   Take 875 mg by mouth 2 (two) times daily.         . Biotin 800 MCG TABS   Oral   Take 800 mcg by mouth daily.         . cetirizine (ZYRTEC) 10 MG tablet   Oral   Take 10 mg by mouth every morning.         . citalopram (CELEXA) 40 MG tablet   Oral    Take 40 mg by mouth daily.         Marland Kitchen estradiol (ESTRACE) 0.1 MG/GM vaginal cream   Vaginal   Place 2 g vaginally daily.          . fluticasone (FLONASE) 50 MCG/ACT nasal spray   Nasal   Place 1 spray into the nose 2 (two) times daily.          Marland Kitchen levETIRAcetam (KEPPRA) 750 MG tablet   Oral   Take 2,250 mg by mouth at bedtime.          . meloxicam (MOBIC) 15 MG tablet   Oral   Take 15 mg by mouth every morning.         Marland Kitchen METFORMIN HCL ER PO   Oral   Take 500 mg by mouth at bedtime.         Marland Kitchen oxybutynin (DITROPAN) 5 MG tablet   Oral   Take 5 mg by mouth 2 (two) times daily.         . paliperidone (INVEGA) 3 MG 24 hr tablet   Oral   Take 6 mg by mouth at bedtime.          . pantoprazole (PROTONIX) 40 MG tablet   Oral   Take 1 tablet (40 mg total) by mouth daily.   30 tablet   11   . QUEtiapine (SEROQUEL XR) 200 MG 24 hr tablet   Oral   Take 200 mg by mouth at bedtime.         . traZODone (DESYREL) 50 MG tablet   Oral   Take 50 mg by mouth at bedtime.         Marland Kitchen albuterol (PROVENTIL) (2.5 MG/3ML) 0.083% nebulizer solution   Nebulization   Take 2.5 mg by nebulization every 6 (six) hours as needed for wheezing.         . nitroGLYCERIN (NITROSTAT) 0.4 MG SL tablet   Sublingual   Place 0.4 mg under the tongue every 5 (five) minutes as needed. For chest pains         . Vitamin D, Ergocalciferol, (DRISDOL) 50000 UNITS CAPS capsule   Oral   Take 50,000 Units by mouth every 7 (seven) days.          BP 113/75  Pulse 67  Temp(Src) 98.4 F (36.9 C) (Oral)  Resp 20  SpO2 97% Physical Exam  Nursing note and vitals reviewed. Constitutional: She is oriented to person, place, and time. She appears well-developed and well-nourished. No  distress.  HENT:  Head: Normocephalic and atraumatic.  Mouth/Throat: Oropharynx is clear and moist.  No tenderness to the back of her head  Eyes: Conjunctivae are normal. Pupils are equal, round, and reactive to  light. No scleral icterus.  Neck: Neck supple.  Cardiovascular: Normal rate, regular rhythm, normal heart sounds and intact distal pulses.   No murmur heard. Pulmonary/Chest: Effort normal and breath sounds normal. No stridor. No respiratory distress. She has no rales.  Abdominal: Soft. Bowel sounds are normal. She exhibits no distension. There is no tenderness.  Musculoskeletal: Normal range of motion.  Neurological: She is alert and oriented to person, place, and time.  Skin: Skin is warm and dry. No rash noted.  Psychiatric: She has a normal mood and affect. Her behavior is normal.    ED Course  Procedures (including critical care time) DIAGNOSTIC STUDIES: COORDINATION OF CARE:  2:34 PM Discussed course of care with pt . Pt understands and agrees.    Labs Review Labs Reviewed  CBC WITH DIFFERENTIAL - Abnormal; Notable for the following:    Hemoglobin 11.7 (*)    MCV 72.6 (*)    MCH 23.3 (*)    All other components within normal limits  BASIC METABOLIC PANEL - Abnormal; Notable for the following:    Glucose, Bld 109 (*)    Creatinine, Ser 1.20 (*)    GFR calc non Af Amer 55 (*)    GFR calc Af Amer 64 (*)    All other components within normal limits   Imaging Review No results found.  EKG Interpretation   None       MDM   1. Seizure    Patient with possible seizures. History of same. She is back at her baseline. She states she feels as if she had a seizure. She does not want to increase her Keppra this time. She states she has followup with neurology this week. Will discharge home.    Jasper Riling. Alvino Chapel, MD 02/08/13 1548  Patient is previously been worked up by cardiology and is worn an event monitor.  Jasper Riling. Alvino Chapel, MD 02/08/13 1549

## 2013-02-08 NOTE — Discharge Instructions (Signed)
Epilepsy A seizure (convulsion) is a sudden change in brain function that causes a change in behavior, muscle activity, or ability to remain awake and alert. If a person has recurring seizures, this is called epilepsy. CAUSES  Epilepsy is a disorder with many possible causes. Anything that disturbs the normal pattern of brain cell activity can lead to seizures. Seizure can be caused from illness to brain damage to abnormal brain development. Epilepsy may develop because of:  An abnormality in brain wiring.  An imbalance of nerve signaling chemicals (neurotransmitters).  Some combination of these factors. Scientists are learning an increasing amount about genetic causes of seizures. SYMPTOMS  The symptoms of a seizure can vary greatly from one person to another. These may include:  An aura, or warning that tells a person they are about to have a seizure.  Abnormal sensations, such as abnormal smell or seeing flashing lights.  Sudden, general body stiffness.  Rhythmic jerking of the face, arm, or leg  on one or both sides.  Sudden change in consciousness.  The person may appear to be awake but not responding.  They may appear to be asleep but cannot be awakened.  Grimacing, chewing, lip smacking, or drooling.  Often there is a period of sleepiness after a seizure. DIAGNOSIS  The description you give to your caregiver about what you experienced will help them understand your problems. Equally important is the description by any witnesses to your seizure. A physical exam, including a detailed neurological exam, is necessary. An EEG (electroencephalogram) is a painless test of your brain waves. In this test a diagram is created of your brain waves. These diagrams can be interpreted by a specialist. Pictures of your brain are usually taken with:  An MRI.  A CT scan. Lab tests may be done to look for:  Signs of infection.  Abnormal blood chemistry. PREVENTION  There is no way to  prevent the development of epilepsy. If you have seizures that are typically triggered by an event (such as flashing lights), try to avoid the trigger. This can help you avoid a seizure.  PROGNOSIS  Most people with epilepsy lead outwardly normal lives. While epilepsy cannot currently be cured, for some people it does eventually go away. Most seizures do not cause brain damage. It is not uncommon for people with epilepsy, especially children, to develop behavioral and emotional problems. These problems are sometimes the consequence of medicine for seizures or social stress. For some people with epilepsy, the risk of seizures restricts their independence and recreational activities. For example, some states refuse drivers licenses to people with epilepsy. Most women with epilepsy can become pregnant. They should discuss their epilepsy and the medicine they are taking with their caregivers. Women with epilepsy have a 90 percent or better chance of having a normal, healthy baby. RISKS AND COMPLICATIONS  People with epilepsy are at increased risk of falls, accidents, and injuries. People with epilepsy are at special risk for two life-threatening conditions. These are status epilepticus and sudden unexplained death (extremely rare). Status epilepticus is a long lasting, continuous seizure that is a medical emergency. TREATMENT  Once epilepsy is diagnosed, it is important to begin treatment as soon as possible. For about 80 percent of those diagnosed with epilepsy, seizures can be controlled with modern medicines and surgical techniques. Some antiepileptic drugs can interfere with the effectiveness of oral contraceptives. In 1997, the FDA approved a pacemaker for the brain the (vagus nerve stimulator). This stimulator can be used for   people with seizures that are not well-controlled by medicine. Studies have shown that in some cases, children may experience fewer seizures if they maintain a strict diet. The strict  diet is called the ketogenic diet. This diet is rich in fats and low in carbohydrates. HOME CARE INSTRUCTIONS   Your caregiver will make recommendations about driving and safety in normal activities. Follow these carefully.  Take any medicine prescribed exactly as directed.  Do any blood tests requested to monitor the levels of your medicine.  The people you live and work with should know that you are prone to seizures. They should receive instructions on how to help you. In general, a witness to a seizure should:  Cushion your head and body.  Turn you on your side.  Avoid unnecessarily restraining you.  Not place anything inside your mouth.  Call for local emergency medical help if there is any question about what has occurred.  Keep a seizure diary. Record what you recall about any seizure, especially any possible trigger.  If your caregiver has given you a follow-up appointment, it is very important to keep that appointment. Not keeping the appointment could result in permanent injury and disability. If there is any problem keeping the appointment, you must call back to this facility for assistance. SEEK MEDICAL CARE IF:   You develop signs of infection or other illness. This might increase the risk of a seizure.  You seem to be having more frequent seizures.  Your seizure pattern is changing. SEEK IMMEDIATE MEDICAL CARE IF:   A seizure does not stop after a few moments.  A seizure causes any difficulty in breathing.  A seizure results in a very severe headache.  A seizure leaves you with the inability to speak or use a part of your body. MAKE SURE YOU:   Understand these instructions.  Will watch your condition.  Will get help right away if you are not doing well or get worse. Document Released: 01/23/2005 Document Revised: 04/17/2011 Document Reviewed: 09/04/2012 [employer] Patient Information 2014 [employer].  

## 2013-02-19 ENCOUNTER — Telehealth (HOSPITAL_COMMUNITY): Payer: Self-pay | Admitting: Dietician

## 2013-02-19 NOTE — Telephone Encounter (Signed)
Pt was a no-show for appointment scheduled for 02/19/2013 at 1000. Sent letter to pt home notifying pt of no-show and requesting rescheduling appointment.

## 2013-03-24 ENCOUNTER — Ambulatory Visit: Payer: Medicaid Other | Admitting: Cardiovascular Disease

## 2013-04-02 ENCOUNTER — Encounter: Payer: Self-pay | Admitting: Cardiovascular Disease

## 2013-04-02 ENCOUNTER — Encounter: Payer: Self-pay | Admitting: *Deleted

## 2013-04-02 ENCOUNTER — Ambulatory Visit (INDEPENDENT_AMBULATORY_CARE_PROVIDER_SITE_OTHER): Payer: Medicaid Other | Admitting: Cardiovascular Disease

## 2013-04-02 VITALS — BP 131/81 | HR 61 | Ht 66.0 in | Wt 260.0 lb

## 2013-04-02 DIAGNOSIS — Z7189 Other specified counseling: Secondary | ICD-10-CM

## 2013-04-02 DIAGNOSIS — R5381 Other malaise: Secondary | ICD-10-CM

## 2013-04-02 DIAGNOSIS — F17200 Nicotine dependence, unspecified, uncomplicated: Secondary | ICD-10-CM

## 2013-04-02 DIAGNOSIS — R0683 Snoring: Secondary | ICD-10-CM

## 2013-04-02 DIAGNOSIS — Z716 Tobacco abuse counseling: Secondary | ICD-10-CM

## 2013-04-02 DIAGNOSIS — R079 Chest pain, unspecified: Secondary | ICD-10-CM

## 2013-04-02 DIAGNOSIS — R0989 Other specified symptoms and signs involving the circulatory and respiratory systems: Secondary | ICD-10-CM

## 2013-04-02 DIAGNOSIS — R5383 Other fatigue: Secondary | ICD-10-CM

## 2013-04-02 DIAGNOSIS — R0609 Other forms of dyspnea: Secondary | ICD-10-CM

## 2013-04-02 DIAGNOSIS — R002 Palpitations: Secondary | ICD-10-CM

## 2013-04-02 NOTE — Progress Notes (Signed)
Patient ID: Jennifer [name], female   DOB: March 14, 1970, 43 y.o.   MRN: [id number]      SUBJECTIVE: Jennifer [name] returns as she's been having chest pain, marked fatigue, and palpitations. She had previously worn a monitor which did not reveal any tachyarrhythmias. She underwent what was deemed to be a low-risk stress echocardiogram on 10-03-11.  She also has a h/o anxiety, seizures, schizophrenia, and bipolar disorder.  She sleeps most of the day and says "I can sleep 24 hours in a row". It appears she was evaluated for sleep apnea several years ago. She admits to snoring frequently. She cannot say whether or not her chest pain occurs with exertion. She's taken a SL nitro tablet on four separate occasions, causing relief of substernal chest pain. She describes it as a "pressure". The pain is not exacerbated or alleviated by manual pressure, as it had been in the past. When I examined her and she took a deep breath, she had a left-sided chest pain.  She smokes 0.5 ppd.  ECG today shows NSR, sinus arrhythmia, and a diffuse nonspecific T wave abnormality.   Allergies  Allergen Reactions  . Codeine Other (See Comments)    unknown  . Hydrocodone     Rash and itching  . Ivp Dye [Iodinated Diagnostic Agents] Nausea And Vomiting  . Latex Rash    Current Outpatient Prescriptions  Medication Sig Dispense Refill  . albuterol (PROVENTIL) (2.5 MG/3ML) 0.083% nebulizer solution Take 2.5 mg by nebulization every 6 (six) hours as needed for wheezing.      Marland Kitchen AMITIZA 8 MCG capsule TAKE 1 CAPSULE BY MOUTH TWICE DAILY.  60 capsule  11  . Biotin 800 MCG TABS Take 800 mcg by mouth daily.      . cetirizine (ZYRTEC) 10 MG tablet Take 10 mg by mouth every morning.      . citalopram (CELEXA) 40 MG tablet Take 40 mg by mouth daily.      Marland Kitchen estradiol (ESTRACE) 0.1 MG/GM vaginal cream Place 2 g vaginally daily.       . fluticasone (FLONASE) 50 MCG/ACT nasal spray Place 1 spray into the nose 2 (two) times daily.         Marland Kitchen levETIRAcetam (KEPPRA) 750 MG tablet Take 2,250 mg by mouth at bedtime.       . meloxicam (MOBIC) 15 MG tablet Take 15 mg by mouth every morning.      Marland Kitchen METFORMIN HCL ER PO Take 500 mg by mouth at bedtime.      . nitroGLYCERIN (NITROSTAT) 0.4 MG SL tablet Place 0.4 mg under the tongue every 5 (five) minutes as needed. For chest pains      . oxybutynin (DITROPAN) 5 MG tablet Take 5 mg by mouth 2 (two) times daily.      . paliperidone (INVEGA) 3 MG 24 hr tablet Take 6 mg by mouth at bedtime.       . pantoprazole (PROTONIX) 40 MG tablet Take 1 tablet (40 mg total) by mouth daily.  30 tablet  11  . QUEtiapine (SEROQUEL XR) 200 MG 24 hr tablet Take 200 mg by mouth at bedtime.      . traZODone (DESYREL) 50 MG tablet Take 50 mg by mouth at bedtime.      . Vitamin D, Ergocalciferol, (DRISDOL) 50000 UNITS CAPS capsule Take 50,000 Units by mouth every 7 (seven) days.       No current facility-administered medications for this visit.    Past Medical History  Diagnosis Date  . Seizures   . PTSD (post-traumatic stress disorder)   . GERD (gastroesophageal reflux disease)   . Panic attack   . Asthma   . Anxiety   . Schizophrenia   . Bipolar 1 disorder   . S/P endoscopy Feb 2012    Nl, s/p 56-[facility] dilator, biopsy benign  . S/P endoscopy 2008    Mild gastritis  . History of flexible sigmoidoscopy 1999    Normal to 60cm,  . Diabetes mellitus without complication     Past Surgical History  Procedure Laterality Date  . Partial hysterectomy    . Cholecystectomy    . Tonsillectomy    . Hernia repair      Incisional with mesh  . Colonoscopy  03/01/2011    Rourk-External hemorrhoids/otherwise normal rectum and colon.    History   Social History  . Marital Status: Married    Spouse Name: N/A    Number of Children: N/A  . Years of Education: N/A   Occupational History  . disabled    Social History Main Topics  . Smoking status: Current Some Day Smoker -- 0.25 packs/day for  26 years    Types: Cigarettes  . Smokeless tobacco: Not on file     Comment: 3 cig daily as of 11-26-12  . Alcohol Use: Yes     Comment: occa  . Drug Use: No  . Sexual Activity: Yes    Birth Control/ Protection: Surgical   Other Topics Concern  . Not on file   Social History Narrative  . No narrative on file     Filed Vitals:   04/02/13 1259  BP: 131/81  Pulse: 61  Height: 5\' 6"  (1.676 m)  Weight: 260 lb (117.935 kg)  SpO2: 100%    PHYSICAL EXAM General: NAD Neck: No JVD, no thyromegaly. Lungs: Clear to auscultation bilaterally with normal respiratory effort. CV: Nondisplaced PMI.  Regular rate and rhythm, normal S1/S2, no S3/S4, no murmur. No pretibial or periankle edema.  No carotid bruit.  Normal pedal pulses.  Abdomen: Soft, nontender, no hepatosplenomegaly, no distention.  Neurologic: Alert and oriented x 3.  Psych: Normal affect. Extremities: No clubbing or cyanosis.   ECG: reviewed and available in electronic records.      ASSESSMENT AND PLAN: 1. Chest pain: as it has been nearly 1.5 years since her last stress test and she now characterizes her pain differently, I will proceed with a Lexiscan Cardiolite stress test. There are both typical and atypical features. Her risk factors include tobacco abuse and diabetes mellitus. 2. Fatigue: I believe this is multifactorial in etiology, including polypharmacy, sleep apnea, and possibly occult ischemic heart disease. I will arrange for a sleep study and referreral to Dr. Merlene Laughter. As stated previously, I will also order a stress test so as not to overlook any atypical symptoms associated with women and heart disease, especially given her diabetes. 3. Palpitations: these occur primarily when lying in bed and turning over. They appear to be occurring more frequently. Her HR is 61 bpm currently. I will not add any medications as this will lead to bradycardia. I may have her wear another monitor in the future. 4. Tobacco  abuse: cessation counseling provided.  Dispo: f/u 1 month.  [name], M.D., F.A.C.C.

## 2013-04-02 NOTE — Patient Instructions (Signed)
Your physician recommends that you schedule a follow-up appointment in: ONE MONTH WITH Dr. Bronson Ing   Your physician recommends THAT YOU CONTACT DR Yutan ARE TO BE SCHEDULED FOR A FOLLOW UP SLEEP STUDY  Your physician has recommended that you have a sleep study. This test records several body functions during sleep, including: brain activity, eye movement, oxygen and carbon dioxide blood levels, heart rate and rhythm, breathing rate and rhythm, the flow of air through your mouth and nose, snoring, body muscle movements, and chest and belly movement.  Your physician has requested that you have a lexiscan myoview. For further information please visit [email or web address]. Please follow instruction sheet, as given.  WE WILL CALL YOU WITH YOUR TEST RESULTS/INSTRUCTIONS/NEXT STEPS ONCE RECEIVED BY THE PROVIDER

## 2013-04-10 ENCOUNTER — Encounter (HOSPITAL_COMMUNITY): Payer: Self-pay

## 2013-04-10 ENCOUNTER — Encounter (HOSPITAL_COMMUNITY)
Admission: RE | Admit: 2013-04-10 | Discharge: 2013-04-10 | Disposition: A | Discharge status: Home / Self Care | Payer: Medicaid Other | Source: Ambulatory Visit | Attending: Cardiovascular Disease | Admitting: Cardiovascular Disease

## 2013-04-10 ENCOUNTER — Encounter: Payer: Self-pay | Admitting: Neurology

## 2013-04-10 ENCOUNTER — Ambulatory Visit: Payer: Medicaid Other | Attending: Cardiovascular Disease | Admitting: Sleep Medicine

## 2013-04-10 DIAGNOSIS — R5381 Other malaise: Secondary | ICD-10-CM | POA: Insufficient documentation

## 2013-04-10 DIAGNOSIS — G4710 Hypersomnia, unspecified: Secondary | ICD-10-CM | POA: Insufficient documentation

## 2013-04-10 DIAGNOSIS — R079 Chest pain, unspecified: Secondary | ICD-10-CM

## 2013-04-10 DIAGNOSIS — R0609 Other forms of dyspnea: Secondary | ICD-10-CM | POA: Insufficient documentation

## 2013-04-10 DIAGNOSIS — R0789 Other chest pain: Secondary | ICD-10-CM

## 2013-04-10 DIAGNOSIS — R5383 Other fatigue: Secondary | ICD-10-CM

## 2013-04-10 DIAGNOSIS — R0989 Other specified symptoms and signs involving the circulatory and respiratory systems: Secondary | ICD-10-CM | POA: Insufficient documentation

## 2013-04-10 DIAGNOSIS — R0683 Snoring: Secondary | ICD-10-CM

## 2013-04-10 DIAGNOSIS — E669 Obesity, unspecified: Secondary | ICD-10-CM | POA: Insufficient documentation

## 2013-04-10 DIAGNOSIS — R002 Palpitations: Secondary | ICD-10-CM

## 2013-04-10 MED ORDER — SODIUM CHLORIDE 0.9 % IJ SOLN
INTRAMUSCULAR | Status: AC
  Administered 2013-04-10: 10 mL via INTRAVENOUS
  Filled 2013-04-10: qty 10

## 2013-04-10 MED ORDER — TECHNETIUM TC 99M SESTAMIBI GENERIC - CARDIOLITE
10.0000 | Freq: Once | INTRAVENOUS | Status: AC | PRN
  Administered 2013-04-10: 10 via INTRAVENOUS

## 2013-04-10 MED ORDER — REGADENOSON 0.4 MG/5ML IV SOLN
INTRAVENOUS | Status: AC
  Administered 2013-04-10: 0.4 mg via INTRAVENOUS
  Filled 2013-04-10: qty 5

## 2013-04-10 MED ORDER — TECHNETIUM TC 99M SESTAMIBI - CARDIOLITE
30.0000 | Freq: Once | INTRAVENOUS | Status: AC | PRN
  Administered 2013-04-10: 11:00:00 30 via INTRAVENOUS

## 2013-04-10 NOTE — Progress Notes (Signed)
Stress Lab Nurses Notes - Jennifer [name]  Jennifer [name] 04/10/2013 Reason for doing test: Chest Pain and Dyspnea Type of test: Jennifer [name] Nurse performing test: Jennifer Halls, RN Nuclear Medicine Tech: Jennifer [name] Echo Tech: Not Applicable MD performing test: Jennifer [name]/Jennifer Nails NP Family MD: Jennifer Edwards, NP Test explained and consent signed: yes IV started: 22g jelco, Saline lock flushed, No redness or edema and Saline lock started in radiology Symptoms: chest pressure & Headache Treatment/Intervention: None Reason test stopped: protocol completed After recovery IV was: Discontinued via X-ray tech and No redness or edema Patient to return to [place]. Med at : 11:15 Patient discharged: Home Patient's Condition upon discharge was: stable Comments: During test BP 112/68 & HR 122.  Recovery BP 115/67 & HR 106.  Symptoms resolved in recovery. Jennifer [name]

## 2013-04-20 NOTE — Sleep Study (Signed)
  Jennifer A. Merlene Laughter, MD     [email or web address]          LOCATION: SLEEP LAB FACILITY: [facility]  PHYSICIAN: Rawson Minix A. Merlene [name], M.D.   DATE OF STUDY: 04/10/2013  NOCTURNAL POLYSOMNOGRAM   REFERRING PHYSICIAN: [name].  INDICATIONS:  This is a 43 year old presents with hypersomnia, obesity, snoring and fatigue.  MEDICATIONS:  Prior to Admission medications   Medication Sig Start Date End Date Taking? Authorizing Provider  albuterol (PROVENTIL) (2.5 MG/3ML) 0.083% nebulizer solution Take 2.5 mg by nebulization every 6 (six) hours as needed for wheezing.    Historical Provider, MD  AMITIZA 8 MCG capsule TAKE 1 CAPSULE BY MOUTH TWICE DAILY. 12/10/12   [name], PA-C  Biotin 800 MCG TABS Take 800 mcg by mouth daily.    Historical Provider, MD  cetirizine (ZYRTEC) 10 MG tablet Take 10 mg by mouth every morning.    Historical Provider, MD  citalopram (CELEXA) 40 MG tablet Take 40 mg by mouth daily.    Historical Provider, MD  estradiol (ESTRACE) 0.1 MG/GM vaginal cream Place 2 g vaginally daily.     Historical Provider, MD  fluticasone (FLONASE) 50 MCG/ACT nasal spray Place 1 spray into the nose 2 (two) times daily.     Historical Provider, MD  levETIRAcetam (KEPPRA) 750 MG tablet Take 2,250 mg by mouth at bedtime.     Historical Provider, MD  meloxicam (MOBIC) 15 MG tablet Take 15 mg by mouth every morning.    Historical Provider, MD  METFORMIN HCL ER PO Take 500 mg by mouth at bedtime.    Historical Provider, MD  nitroGLYCERIN (NITROSTAT) 0.4 MG SL tablet Place 0.4 mg under the tongue every 5 (five) minutes as needed. For chest pains    Historical Provider, MD  oxybutynin (DITROPAN) 5 MG tablet Take 5 mg by mouth 2 (two) times daily.    Historical Provider, MD  paliperidone (INVEGA) 3 MG 24 hr tablet Take 6 mg by mouth at bedtime.     Historical Provider, MD  pantoprazole (PROTONIX) 40 MG tablet Take 1 tablet (40 mg total) by mouth daily. 06/20/12   [name], PA-C  QUEtiapine (SEROQUEL XR) 200 MG 24 hr tablet Take 200 mg by mouth at bedtime.    Historical Provider, MD  traZODone (DESYREL) 50 MG tablet Take 50 mg by mouth at bedtime.    Historical Provider, MD  Vitamin D, Ergocalciferol, (DRISDOL) 50000 UNITS CAPS capsule Take 50,000 Units by mouth every 7 (seven) days.    Historical Provider, MD      EPWORTH SLEEPINESS SCALE: 13.   BMI: 42.   ARCHITECTURAL SUMMARY: Total recording time was 432 minutes. Sleep efficiency 69 %. Sleep latency 123 minutes. REM latenc125 minutes. Stage NI 6 %, N2 78 % and N3 1 % and REM sleep 16 %.    RESPIRATORY DATA:  Baseline oxygen saturation is 94 %. The lowest saturation is 89 %. The diagnostic AHI is 1. The RDI is 1. The REM AHI is 1.  LIMB MOVEMENT SUMMARY: PLM index 0.   ELECTROCARDIOGRAM SUMMARY: Average heart rate is 84 with no significant  dysrhythmias observed.   IMPRESSION:  1. Unremarkable nocturnal polysomnography.  Thanks for this referral.  [name] A. Merlene [name], M.D. Diplomat, Tax adviser of Sleep Medicine.

## 2013-05-08 ENCOUNTER — Other Ambulatory Visit: Payer: Self-pay | Admitting: Gastroenterology

## 2013-05-20 ENCOUNTER — Ambulatory Visit: Payer: Medicaid Other | Admitting: Cardiovascular Disease

## 2013-06-09 ENCOUNTER — Encounter: Payer: Self-pay | Admitting: Cardiovascular Disease

## 2013-06-09 ENCOUNTER — Ambulatory Visit (INDEPENDENT_AMBULATORY_CARE_PROVIDER_SITE_OTHER): Payer: Medicaid Other | Admitting: Cardiovascular Disease

## 2013-06-09 ENCOUNTER — Ambulatory Visit (HOSPITAL_COMMUNITY)
Admission: RE | Admit: 2013-06-09 | Discharge: 2013-06-09 | Disposition: A | Discharge status: Home / Self Care | Payer: Medicaid Other | Source: Ambulatory Visit | Attending: Cardiovascular Disease | Admitting: Cardiovascular Disease

## 2013-06-09 VITALS — BP 114/80 | HR 111 | Ht 60.0 in | Wt 252.4 lb

## 2013-06-09 DIAGNOSIS — I10 Essential (primary) hypertension: Secondary | ICD-10-CM

## 2013-06-09 DIAGNOSIS — R079 Chest pain, unspecified: Secondary | ICD-10-CM | POA: Insufficient documentation

## 2013-06-09 DIAGNOSIS — R0602 Shortness of breath: Secondary | ICD-10-CM

## 2013-06-09 DIAGNOSIS — R002 Palpitations: Secondary | ICD-10-CM

## 2013-06-09 DIAGNOSIS — R918 Other nonspecific abnormal finding of lung field: Secondary | ICD-10-CM | POA: Insufficient documentation

## 2013-06-09 DIAGNOSIS — R000 Tachycardia, unspecified: Secondary | ICD-10-CM | POA: Insufficient documentation

## 2013-06-09 LAB — CBC
HEMATOCRIT: 38.2 % (ref 36.0–46.0)
HEMOGLOBIN: 12.5 g/dL (ref 12.0–15.0)
MCH: 23.8 pg — ABNORMAL LOW (ref 26.0–34.0)
MCHC: 32.7 g/dL (ref 30.0–36.0)
MCV: 72.6 fL — AB (ref 78.0–100.0)
Platelets: 297 10*3/uL (ref 150–400)
RBC: 5.26 MIL/uL — ABNORMAL HIGH (ref 3.87–5.11)
RDW: 16 % — ABNORMAL HIGH (ref 11.5–15.5)
WBC: 9.6 10*3/uL (ref 4.0–10.5)

## 2013-06-09 LAB — D-DIMER, QUANTITATIVE: D-Dimer, Quant: 0.67 ug/mL-FEU — ABNORMAL HIGH (ref 0.00–0.48)

## 2013-06-09 NOTE — Progress Notes (Signed)
Patient ID: KHAMIYAH GREFE, female   DOB: [birth year], 43 y.o.   MRN: [id number]      SUBJECTIVE: The patient is a 43 year old woman who is here to followup on cardiovascular testing performed for the evaluation of chest pain and fatigue. She also has a history of palpitations. Nuclear stress testing revealed no evidence of ischemia or scar and was deemed low risk. Sleep study was also normal Event monitoring in September 2014 revealed sinus rhythm and sinus tachycardia.  She has been feeling more dyspneic with exertion for the past 3 days. She also has pleuritic chest pain. She denies fevers but says she has been coughing up green phlegm. She has continued to feel fatigued.    Allergies  Allergen Reactions  . Codeine Other (See Comments)    unknown  . Hydrocodone     Rash and itching  . Ivp Dye [Iodinated Diagnostic Agents] Nausea And Vomiting  . Latex Rash    Current Outpatient Prescriptions  Medication Sig Dispense Refill  . albuterol (PROVENTIL) (2.5 MG/3ML) 0.083% nebulizer solution Take 2.5 mg by nebulization every 6 (six) hours as needed for wheezing.      Marland Kitchen AMITIZA 8 MCG capsule TAKE 1 CAPSULE BY MOUTH TWICE DAILY.  60 capsule  11  . Biotin 800 MCG TABS Take 800 mcg by mouth daily.      . cetirizine (ZYRTEC) 10 MG tablet Take 10 mg by mouth every morning.      . citalopram (CELEXA) 40 MG tablet Take 40 mg by mouth daily.      Marland Kitchen estradiol (ESTRACE) 0.1 MG/GM vaginal cream Place 2 g vaginally daily.       . fluticasone (FLONASE) 50 MCG/ACT nasal spray Place 1 spray into the nose 2 (two) times daily.       Marland Kitchen levETIRAcetam (KEPPRA) 750 MG tablet Take 2,250 mg by mouth at bedtime.       . meloxicam (MOBIC) 15 MG tablet Take 15 mg by mouth every morning.      Marland Kitchen METFORMIN HCL ER PO Take 500 mg by mouth at bedtime.      . nitroGLYCERIN (NITROSTAT) 0.4 MG SL tablet Place 0.4 mg under the tongue every 5 (five) minutes as needed. For chest pains      . oxybutynin (DITROPAN) 5 MG tablet  Take 5 mg by mouth 2 (two) times daily.      . paliperidone (INVEGA) 3 MG 24 hr tablet Take 6 mg by mouth at bedtime.       . pantoprazole (PROTONIX) 40 MG tablet TAKE 1 TABLET ONCE DAILY.  30 tablet  5  . QUEtiapine (SEROQUEL XR) 200 MG 24 hr tablet Take 200 mg by mouth at bedtime.      . traZODone (DESYREL) 50 MG tablet Take 50 mg by mouth at bedtime.      . Vitamin D, Ergocalciferol, (DRISDOL) 50000 UNITS CAPS capsule Take 50,000 Units by mouth every 7 (seven) days.       No current facility-administered medications for this visit.    Past Medical History  Diagnosis Date  . Seizures   . PTSD (post-traumatic stress disorder)   . GERD (gastroesophageal reflux disease)   . Panic attack   . Asthma   . Anxiety   . Schizophrenia   . Bipolar 1 disorder   . S/P endoscopy Feb 2012    Nl, s/p 56-French [facility] dilator, biopsy benign  . S/P endoscopy 2008    Mild gastritis  . History of  flexible sigmoidoscopy 1999    Normal to 60cm,  . Diabetes mellitus without complication     Past Surgical History  Procedure Laterality Date  . Partial hysterectomy    . Cholecystectomy    . Tonsillectomy    . Hernia repair      Incisional with mesh  . Colonoscopy  03/01/2011    Rourk-External hemorrhoids/otherwise normal rectum and colon.    History   Social History  . Marital Status: Married    Spouse Name: N/A    Number of Children: N/A  . Years of Education: N/A   Occupational History  . disabled    Social History Main Topics  . Smoking status: Current Some Day Smoker -- 0.25 packs/day for 26 years    Types: Cigarettes  . Smokeless tobacco: Not on file     Comment: 3 cig daily as of 11-26-12  . Alcohol Use: Yes     Comment: occa  . Drug Use: No  . Sexual Activity: Yes    Birth Control/ Protection: Surgical   Other Topics Concern  . Not on file   Social History Narrative  . No narrative on file     Filed Vitals:   06/09/13 1558  BP: 114/80  Pulse: 111  Height: 5'  (1.524 m)  Weight: 252 lb 6.4 oz (114.488 kg)    PHYSICAL EXAM General: NAD Neck: No JVD, no thyromegaly. Lungs: Clear to auscultation bilaterally with normal respiratory effort. CV: Nondisplaced PMI. Tachycardic, regular rhythm, normal S1/S2, no S3/S4, no murmur. No pretibial or periankle edema.  No carotid bruit.  Normal pedal pulses.  Abdomen: Soft, nontender, no hepatosplenomegaly, no distention.  Neurologic: Alert and oriented x 3.  Psych: Normal affect. Extremities: No clubbing or cyanosis.   ECG: reviewed and available in electronic records.      ASSESSMENT AND PLAN: 1. Chest pain: Lexiscan Cardiolite stress test was normal. This is likely noncardiac in etiology. Given her tachycardia, pleuritic nature of chest pain, and increasing dyspnea, I want to evaluate for possible pulmonary pathology, with consideration for pulmonary embolism. I will start with a d-dimer, CBC, and a chest xray. I will have her f/u next week. I've asked her to call her PCP to be seen this week as well. 2. Fatigue: I believe this is multifactorial in etiology. Sleep study was normal. However, given her increasing dyspnea with exertion and productive cough with green phlegm, I will proceed to rule out pulmonary pathology. 3. Palpitations: I will hold off on ordering an event monitor. Heart rate is currently 111 beats per minute. ECG shows sinus tachycardia with a nonspecific T wave abnormality. Workup as per #1. 4. Tobacco abuse: Cessation counseling previously provided.  5. Dyspnea with exertion: Workup as per #1.  Dispo: f/u 1 week.  [name], M.D., F.A.C.C.

## 2013-06-09 NOTE — Patient Instructions (Addendum)
Your physician recommends that you schedule a follow-up appointment in: next week   Please see you primary doctor this week I made you an apt TOMORROW at 10 am with Yevette Edwards NP   Please get blood work and chest x-ray NOW   Thank you for choosing [facility] !

## 2013-06-17 ENCOUNTER — Ambulatory Visit (INDEPENDENT_AMBULATORY_CARE_PROVIDER_SITE_OTHER): Payer: Medicaid Other | Admitting: Adult Health

## 2013-06-17 ENCOUNTER — Encounter: Payer: Self-pay | Admitting: Adult Health

## 2013-06-17 VITALS — BP 113/72 | HR 104 | Ht 66.0 in | Wt 234.0 lb

## 2013-06-17 DIAGNOSIS — R0600 Dyspnea, unspecified: Secondary | ICD-10-CM

## 2013-06-17 DIAGNOSIS — R0989 Other specified symptoms and signs involving the circulatory and respiratory systems: Secondary | ICD-10-CM

## 2013-06-17 DIAGNOSIS — R0609 Other forms of dyspnea: Secondary | ICD-10-CM

## 2013-06-17 NOTE — Assessment & Plan Note (Signed)
Continues to have occasional palpitations which she states cause her to feel lightheaded. These are transient and short-lived. No changes in her medications.

## 2013-06-17 NOTE — Assessment & Plan Note (Signed)
BP is well controlled. No changes. She will be seen in 6 months.

## 2013-06-17 NOTE — Patient Instructions (Signed)
Your physician wants you to follow-up in: 6 months You will receive a reminder letter in the mail two months in advance. If you don't receive a letter, please call our office to schedule the follow-up appointment.     Your physician recommends that you continue on your current medications as directed. Please refer to the Current Medication list given to you today.      Thank you for choosing [facility] !        

## 2013-06-17 NOTE — Assessment & Plan Note (Signed)
Chest X-ray demonstrated infective process. She is finishing up her Z-Pack.

## 2013-06-17 NOTE — Progress Notes (Signed)
HPI: Jennifer [name] is a 43 year old female patient of Dr. Pennelope Bracken were following for ongoing assessment and management of chest pain fatigue. With history of palpitations. Patient's last seen by Dr. Pennelope Bracken in May of 2015 with complaints of dyspnea on exertion. She also has some pleuritic chest pain.  The patient had a Lexiscan Cardiolite stress test which was normal and was likely noncardiac in etiology cause of chest pain. Given her tachycardia pleuritic nature chest pain increasing dyspnea she was to be evaluated for possible pulmonary pathology with consideration for pulmonary emboli. A d-dimer CBC chest x-ray was completed. He also felt her fatigue was multifactorial. She had a normal sleep study.  D-dimer was negative, but chest x-ray demonstrated probable infective process. She was started on a Z-Pak. She is here for followup.   She is doing much better. Continues to cough, but breathing is better. She has had some changes in her medications per PCP.   Allergies  Allergen Reactions  . Codeine Other (See Comments)    unknown  . Hydrocodone     Rash and itching  . Ivp Dye [Iodinated Diagnostic Agents] Nausea And Vomiting  . Latex Rash    Current Outpatient Prescriptions  Medication Sig Dispense Refill  . albuterol (PROVENTIL) (2.5 MG/3ML) 0.083% nebulizer solution Take 2.5 mg by nebulization every 6 (six) hours as needed for wheezing.      Marland Kitchen AMITIZA 8 MCG capsule TAKE 1 CAPSULE BY MOUTH TWICE DAILY.  60 capsule  11  . Biotin 800 MCG TABS Take 800 mcg by mouth daily.      . cetirizine (ZYRTEC) 10 MG tablet Take 10 mg by mouth every morning.      . citalopram (CELEXA) 40 MG tablet Take 40 mg by mouth daily.      Marland Kitchen estradiol (ESTRACE) 0.1 MG/GM vaginal cream Place 2 g vaginally daily.       . fluticasone (FLONASE) 50 MCG/ACT nasal spray Place 1 spray into the nose 2 (two) times daily.       Marland Kitchen levETIRAcetam (KEPPRA) 750 MG tablet Take 2,250 mg by mouth at bedtime.       .  meloxicam (MOBIC) 15 MG tablet Take 15 mg by mouth every morning.      Marland Kitchen METFORMIN HCL ER PO Take 500 mg by mouth at bedtime.      . nitroGLYCERIN (NITROSTAT) 0.4 MG SL tablet Place 0.4 mg under the tongue every 5 (five) minutes as needed. For chest pains      . oxybutynin (DITROPAN) 5 MG tablet Take 5 mg by mouth 2 (two) times daily.      . paliperidone (INVEGA) 3 MG 24 hr tablet Take 6 mg by mouth at bedtime.       . pantoprazole (PROTONIX) 40 MG tablet TAKE 1 TABLET ONCE DAILY.  30 tablet  5  . QUEtiapine (SEROQUEL XR) 200 MG 24 hr tablet Take 200 mg by mouth at bedtime.      . traZODone (DESYREL) 50 MG tablet Take 50 mg by mouth at bedtime.      . Vitamin D, Ergocalciferol, (DRISDOL) 50000 UNITS CAPS capsule Take 50,000 Units by mouth every 7 (seven) days.       No current facility-administered medications for this visit.    Past Medical History  Diagnosis Date  . Seizures   . PTSD (post-traumatic stress disorder)   . GERD (gastroesophageal reflux disease)   . Panic attack   . Asthma   . Anxiety   .  Schizophrenia   . Bipolar 1 disorder   . S/P endoscopy Feb 2012    Nl, s/p 56-French [facility] dilator, biopsy benign  . S/P endoscopy 2008    Mild gastritis  . History of flexible sigmoidoscopy 1999    Normal to 60cm,  . Diabetes mellitus without complication     Past Surgical History  Procedure Laterality Date  . Partial hysterectomy    . Cholecystectomy    . Tonsillectomy    . Hernia repair      Incisional with mesh  . Colonoscopy  03/01/2011    Rourk-External hemorrhoids/otherwise normal rectum and colon.    KAJ:GOTLXB of systems complete and found to be negative unless listed above  PHYSICAL EXAM BP 113/72  Pulse 104  Ht 5\' 6"  (1.676 m)  Wt 234 lb (106.142 kg)  BMI 37.79 kg/m2 General: Well developed, well nourished, in no acute distress Head: Eyes PERRLA, No xanthomas.   Normal cephalic and atramatic  Lungs: Clear bilaterally with some crackles in the left base.  Occasional coughing. Heart: HRRR S1 S2, without MRG.  Pulses are 2+ & equal.            No carotid bruit. No JVD.  No abdominal bruits. No femoral bruits. Abdomen: Bowel sounds are positive, abdomen soft and non-tender without masses or                  Hernia's noted. Msk:  Back normal, normal gait. Normal strength and tone for age. Extremities: No clubbing, cyanosis or edema.  DP +1 Neuro: Alert and oriented X 3. Psych:  Good affect, responds appropriately   ASSESSMENT AND PLAN

## 2013-06-17 NOTE — Progress Notes (Deleted)
Name: Jennifer [name]    DOB: [birth year]  Age: 43 y.o.  MR#: [id number]       PCP:  Yevette Edwards, NP      Insurance: Payor: MEDICAID [place] / Plan: [place] / Product Type: *No Product type* /   CC:    Chief Complaint  Patient presents with  . Hypertension  . Palpitations    VS Filed Vitals:   06/17/13 1328  BP: 113/72  Pulse: 104  Height: _0  (1.676 m)  Weight: 234 lb (106.142 kg)    Weights Current Weight  06/17/13 234 lb (106.142 kg)  06/09/13 252 lb 6.4 oz (114.488 kg)  04/10/13 260 lb (117.935 kg)    Blood Pressure  BP Readings from Last 3 Encounters:  06/17/13 113/72  06/09/13 114/80  04/02/13 131/81     Admit date:  (Not on file) Last encounter with RMR:  Visit date not found   Allergy Codeine; Hydrocodone; Ivp dye; and Latex  Current Outpatient Prescriptions  Medication Sig Dispense Refill  . albuterol (PROVENTIL) (2.5 MG/3ML) 0.083% nebulizer solution Take 2.5 mg by nebulization every 6 (six) hours as needed for wheezing.      Marland Kitchen AMITIZA 8 MCG capsule TAKE 1 CAPSULE BY MOUTH TWICE DAILY.  60 capsule  11  . Biotin 800 MCG TABS Take 800 mcg by mouth daily.      . cetirizine (ZYRTEC) 10 MG tablet Take 10 mg by mouth every morning.      . citalopram (CELEXA) 40 MG tablet Take 40 mg by mouth daily.      Marland Kitchen estradiol (ESTRACE) 0.1 MG/GM vaginal cream Place 2 g vaginally daily.       . fluticasone (FLONASE) 50 MCG/ACT nasal spray Place 1 spray into the nose 2 (two) times daily.       Marland Kitchen levETIRAcetam (KEPPRA) 750 MG tablet Take 2,250 mg by mouth at bedtime.       . meloxicam (MOBIC) 15 MG tablet Take 15 mg by mouth every morning.      Marland Kitchen METFORMIN HCL ER PO Take 500 mg by mouth at bedtime.      . nitroGLYCERIN (NITROSTAT) 0.4 MG SL tablet Place 0.4 mg under the tongue every 5 (five) minutes as needed. For chest pains      . oxybutynin (DITROPAN) 5 MG tablet Take 5 mg by mouth 2 (two) times daily.      . paliperidone (INVEGA) 3 MG 24 hr tablet Take 6 mg by  mouth at bedtime.       . pantoprazole (PROTONIX) 40 MG tablet TAKE 1 TABLET ONCE DAILY.  30 tablet  5  . QUEtiapine (SEROQUEL XR) 200 MG 24 hr tablet Take 200 mg by mouth at bedtime.      . traZODone (DESYREL) 50 MG tablet Take 50 mg by mouth at bedtime.      . Vitamin D, Ergocalciferol, (DRISDOL) 50000 UNITS CAPS capsule Take 50,000 Units by mouth every 7 (seven) days.       No current facility-administered medications for this visit.    Discontinued Meds:   There are no discontinued medications.  Patient Active Problem List   Diagnosis Date Noted  . Hiatal hernia 08/20/2012  . Weight gain 06/20/2012  . Obesity 11/22/2011  . Atypical chest pain 09/21/2011  . HTN (hypertension) 09/21/2011  . Palpitations 09/21/2011  . Chondromalacia of left patella 05/23/2011  . Patellofemoral pain syndrome 04/24/2011  . Hematochezia 02/22/2011  . Knee pain 12/28/2010  .  Muscle weakness (generalized) 12/28/2010  . Dysphagia 05/19/2010  . Vaginal discharge 05/19/2010  . *** LATEX ALLERGY *** 03/08/2010  . PSYCHIATRIC DISORDER 03/10/2009  . ANEMIA 03/09/2009  . GASTROESOPHAGEAL REFLUX DISEASE, CHRONIC 03/09/2009  . CONSTIPATION 03/09/2009  . SEIZURE DISORDER 03/09/2009  . INSOMNIA 03/09/2009  . NAUSEA 03/09/2009  . ABDOMINAL PAIN 03/09/2009  . DERANGEMENT MENISCUS 11/11/2008  . H N P-LUMBAR 11/11/2008  . SCIATICA 11/11/2008    LABS    Component Value Date/Time   NA 140 02/08/2013 1340   NA 136 12/03/2012 1244   NA 143 05/18/2010 1609   K 3.8 02/08/2013 1340   K 4.0 12/03/2012 1244   K 4.1 02/23/2012   K 4.8 05/18/2010 1609   CL 105 02/08/2013 1340   CL 101 12/03/2012 1244   CL 107 05/18/2010 1609   CO2 23 02/08/2013 1340   CO2 25 12/03/2012 1244   CO2 25 05/18/2010 1609   GLUCOSE 109* 02/08/2013 1340   GLUCOSE 95 12/03/2012 1244   GLUCOSE 88 05/18/2010 1609   BUN 19 02/08/2013 1340   BUN 11 12/03/2012 1244   BUN 19 02/23/2012   BUN 17 05/18/2010 1609   CREATININE 1.20* 02/08/2013 1340    CREATININE 1.10 12/03/2012 1244   CREATININE 1.25 02/23/2012   CREATININE 1.17 05/18/2010 1609   CREATININE 1.03 02/23/2010 1444   CALCIUM 9.3 02/08/2013 1340   CALCIUM 9.4 12/03/2012 1244   CALCIUM 8.9 02/23/2012   CALCIUM 9.8 05/18/2010 1609   GFRNONAA 55* 02/08/2013 1340   GFRNONAA 61* 12/03/2012 1244   GFRNONAA 60* 02/23/2010 1444   GFRAA 64* 02/08/2013 1340   GFRAA 71* 12/03/2012 1244   GFRAA  Value: >60        The eGFR has been calculated using the MDRD equation. This calculation has not been validated in all clinical situations. eGFR's persistently <60 mL/min signify possible Chronic Kidney Disease. 02/23/2010 1444   CMP     Component Value Date/Time   NA 140 02/08/2013 1340   K 3.8 02/08/2013 1340   K 4.1 02/23/2012   CL 105 02/08/2013 1340   CO2 23 02/08/2013 1340   GLUCOSE 109* 02/08/2013 1340   BUN 19 02/08/2013 1340   BUN 19 02/23/2012   CREATININE 1.20* 02/08/2013 1340   CREATININE 1.25 02/23/2012   CALCIUM 9.3 02/08/2013 1340   CALCIUM 8.9 02/23/2012   PROT 7.6 05/18/2010 1609   ALBUMIN 4.3 05/18/2010 1609   AST 13 02/23/2012   AST 13 05/18/2010 1609   ALT 10 02/23/2012   ALKPHOS 71 02/23/2012   ALKPHOS 75 05/18/2010 1609   BILITOT 0.1 02/23/2012   BILITOT 0.3 05/18/2010 1609   GFRNONAA 55* 02/08/2013 1340   GFRAA 64* 02/08/2013 1340       Component Value Date/Time   WBC 9.6 06/09/2013 1636   WBC 6.3 02/08/2013 1319   WBC 6.9 12/03/2012 1244   HGB 12.5 06/09/2013 1636   HGB 11.7* 02/08/2013 1319   HGB 12.2 12/03/2012 1244   HCT 38.2 06/09/2013 1636   HCT 36.5 02/08/2013 1319   HCT 37.5 12/03/2012 1244   MCV 72.6* 06/09/2013 1636   MCV 72.6* 02/08/2013 1319   MCV 72.7* 12/03/2012 1244    Lipid Panel  No results found for this basename: chol, trig, hdl, cholhdl, vldl, ldlcalc    ABG No results found for this basename: phart, pco2, pco2art, po2, po2art, hco3, tco2, acidbasedef, o2sat     Lab Results  Component Value Date   TSH 2.195 08/20/2012  BNP (last 3 results) No results found for this  basename: PROBNP,  in the last 8760 hours Cardiac Panel (last 3 results) No results found for this basename: CKTOTAL, CKMB, TROPONINI, RELINDX,  in the last 72 hours  Iron/TIBC/Ferritin No results found for this basename: iron, tibc, ferritin     EKG Orders placed in visit on 06/09/13  . EKG 12-LEAD     Prior Assessment and Plan Problem List as of 06/17/2013     Cardiovascular and Mediastinum   HTN (hypertension)     Respiratory   Hiatal hernia     Digestive   GASTROESOPHAGEAL REFLUX DISEASE, CHRONIC   Last Assessment & Plan   08/20/2012 Office Visit Written 08/20/2012  1:12 PM by [name], PA-C     Chronic GERD/regurgitation. C/o spontaneous water brash and epigastric discomfort with meals. Continued weight gain. Limited physical activity. Unhealthy meal choices, per patient limited resources/funds. Constipation currently manageable on Amitiza.   1. Continue pantoprazole. 2. TSH. 3. UGI series to evaluate hiatal hernia.  4. We will contact dietician to see if a diet plan can be provided to patient.  5. Discussed with patient, gastric bypass is not going to "fix" everything. Unfortunately, she has psychosocial factors which will likely prevent her from having successful weight loss surgery/weight loss outcome. Discussed need to make dietary changes and increase her daily physical activity.    CONSTIPATION   Last Assessment & Plan   11/22/2011 Office Visit Edited 11/22/2011 11:18 AM by Andria Meuse, NP     [name] is a pleasant 43 y.o. female with chronic constipation with pain who has failed colace.     Be sure to start Linzess 226mg daily for constipation Stop Amitiza    Dysphagia   Last Assessment & Plan   09/22/2011 Office Visit Written 09/22/2011  9:09 AM by KAndria Meuse NP     Symptoms are more pharyngeal. No symptoms of esophageal dysphagia at this time.  Trial of PPI, however he she does not have 100% response, she should be seen by ENT.       Nervous and Auditory   SCIATICA     Musculoskeletal and Integument   DERANGEMENT MENISCUS   H N P-LUMBAR   Patellofemoral pain syndrome   Chondromalacia of left patella     Other   Atypical chest pain   Last Assessment & Plan   09/27/2012 Office Visit Written 09/27/2012  2:22 PM by EDonney Dice PA-C     No further workup indicated. Patient had a relatively low risk exercise stress echocardiogram study, in August 2013. Patient's symptoms are quite atypical, and 12-lead EKG today indicates no definite ischemic changes. Recommend continued risk factor modification, with particular emphasis on smoking cessation.    Palpitations   Last Assessment & Plan   09/27/2012 Office Visit Written 09/27/2012  2:19 PM by EDonney Dice PA-C     Will evaluate further with a 21 day event monitor, to rule out dysrhythmia. In the meanwhile, we'll request recent lab work from Dr. BIrene Shipperoffice, particularly regarding electrolytes and TSH level. Patient has been advised to significantly curtail her caffeinated beverage intake, and to stop smoking. No current indication for medical therapy, pending review of monitor results. Of note, patient has documented history of NL LVF.    ANEMIA   PSYCHIATRIC DISORDER   SEIZURE DISORDER   INSOMNIA   NAUSEA   Last Assessment & Plan   10/17/2010 Office Visit Written 10/17/2010  4:30 PM by Andria Meuse, NP     Chronic nausea without vomiting.  GES normal previously.  ? Medication side effect.  Check fasting cortisol.      ABDOMINAL PAIN   Last Assessment & Plan   02/21/2011 Office Visit Edited 02/22/2011  4:53 PM by Orvil Feil, NP     Likely due to hx of IBS, question functional abdominal pain. Thus far labs in past have been unrevealing, EGD on file. Proceeding with TCS due to hematochezia. Due to constipation, intermittent nausea, will switch from Amitiza to Linzess 290 mcg daily. Hopefully, this may help with both constipation and abdominal pain. Follow low-fat  diet.     *** LATEX ALLERGY ***   Vaginal discharge   Last Assessment & Plan   05/18/2010 Office Visit Written 05/19/2010  2:21 PM by Orvil Feil, NP     Lower abdominal pain during and after sexual intercourse; also complains of increased amount of yellowish discharge. Strongly counseled to seek GYN care. Pt has appt May 31st. Asked to schedule sooner.     Knee pain   Muscle weakness (generalized)   Hematochezia   Last Assessment & Plan   02/21/2011 Office Visit Written 02/22/2011  4:51 PM by Orvil Feil, NP     43 year old female with history of chronic abdominal pain, IBS-C now reporting incidences of bright red blood/burgundy stools. No prior TCS performed, does have hx of flex sig in remote past. Likely due to benign anorectal source. Has distant family hx of colon cancer, no first degree relatives.   Proceed with TCS with Dr. Gala Romney in near future: the risks, benefits, and alternatives have been discussed with the patient in detail. The patient states understanding and desires to proceed. Phenergan 25 mg IV 30 min prior to procedure to help facilitate sedation    Obesity   Last Assessment & Plan   11/22/2011 Office Visit Edited 11/22/2011 11:19 AM by Andria Meuse, NP     Recommend 1-2# weight loss per week until ideal body weight through exercise & diet. Low fat/cholesterol diet. Gradually increase exercise from 15 min daily up to 1 hr per day 5 days/week.  Try WATER AEROBICS. Limit alcohol use.  Office visit in 6 months    Weight gain       Imaging: Dg Chest 2 View  06/09/2013   CLINICAL DATA:  Chest pain, shortness of breath, and tachycardia for several days.  EXAM: CHEST  2 VIEW  COMPARISON:  06/11/2012  FINDINGS: The cardiomediastinal silhouette is within normal limits. The patient has taken a slightly greater inspiration than on the prior study. Ill defined opacity is present in the left lung base. Right lung is grossly clear. No pleural effusion or pneumothorax is  identified. No acute osseous abnormality is identified. Surgical clips are present in the upper abdomen.  IMPRESSION: Ill-defined infiltrate in the left lung base, which could reflect developing infection.   Electronically Signed   By: Logan Bores   On: 06/09/2013 17:34

## 2013-10-29 ENCOUNTER — Ambulatory Visit (INDEPENDENT_AMBULATORY_CARE_PROVIDER_SITE_OTHER): Payer: Medicaid Other | Admitting: Gastroenterology

## 2013-10-29 ENCOUNTER — Other Ambulatory Visit: Payer: Self-pay

## 2013-10-29 ENCOUNTER — Encounter: Payer: Self-pay | Admitting: Gastroenterology

## 2013-10-29 ENCOUNTER — Encounter (INDEPENDENT_AMBULATORY_CARE_PROVIDER_SITE_OTHER): Payer: Self-pay

## 2013-10-29 VITALS — BP 104/77 | HR 107 | Temp 98.2°F | Ht 66.0 in | Wt 234.0 lb

## 2013-10-29 DIAGNOSIS — R634 Abnormal weight loss: Secondary | ICD-10-CM

## 2013-10-29 DIAGNOSIS — R109 Unspecified abdominal pain: Secondary | ICD-10-CM

## 2013-10-29 DIAGNOSIS — R1010 Upper abdominal pain, unspecified: Secondary | ICD-10-CM

## 2013-10-29 NOTE — Patient Instructions (Signed)
1. Upper endoscopy with Dr. Rourk. See separate instructions.  

## 2013-10-29 NOTE — Progress Notes (Signed)
Primary Care Physician: Yevette Edwards, NP  Primary Gastroenterologist:  Garfield Cornea, MD   Chief Complaint  Patient presents with  . Abdominal Pain    HPI: Jennifer [name] is a 43 y.o. female here for further evaluation of abdominal pain and abnormal weight loss. She was last seen by our practice in July 2014 for refractory GERD, constipation. Last EGD by Dr. Gala Romney, 2012 showed non-H. pylori gastritis, moderate hiatal hernia, empirically dilated with a 56 French [place] dilator. Last colonoscopy by Dr. Buford Dresser in January 2013 showed external hemorrhoids.  One month history of upper abdominal pain, more on the RUQ. Some vomiting with coughing spells, vomit acid. Some postpranidal pain, avoiding food. Eats small amounts at a time. Weight down nearly 30 pounds in the past couple of months. No heartburn. Some nocturnal symptoms. Intermittent abdominal pain. BM QOD. Soft stool. No melena, brbpr. Not really looking though. Diagnosed with pre-diabetes 6 months ago. Started metformin 500mg  at nighttime, at that time. May have to come off of metformin since weight loss. No longer on Mobic.   PCP suggesting upper endoscopy for abdominal pain and weight loss.  Current Outpatient Prescriptions  Medication Sig Dispense Refill  . albuterol (PROVENTIL) (2.5 MG/3ML) 0.083% nebulizer solution Take 2.5 mg by nebulization every 6 (six) hours as needed for wheezing.      Marland Kitchen AMITIZA 8 MCG capsule TAKE 1 CAPSULE BY MOUTH TWICE DAILY.  60 capsule  11  . cetirizine (ZYRTEC) 10 MG tablet Take 10 mg by mouth every morning.      . citalopram (CELEXA) 40 MG tablet Take 40 mg by mouth daily.      Marland Kitchen estradiol (ESTRACE) 0.1 MG/GM vaginal cream Place 2 g vaginally daily.       . fluticasone (FLONASE) 50 MCG/ACT nasal spray Place 1 spray into the nose 2 (two) times daily.       Marland Kitchen levETIRAcetam (KEPPRA) 750 MG tablet Take 2,250 mg by mouth at bedtime.       Marland Kitchen METFORMIN HCL ER PO Take 500 mg by mouth at bedtime.      Marland Kitchen  oxybutynin (DITROPAN) 5 MG tablet Take 5 mg by mouth 2 (two) times daily.      . paliperidone (INVEGA) 3 MG 24 hr tablet Take 6 mg by mouth at bedtime.       . pantoprazole (PROTONIX) 40 MG tablet TAKE 1 TABLET ONCE DAILY.  30 tablet  5  . traZODone (DESYREL) 50 MG tablet Take 50 mg by mouth at bedtime.      . Vitamin D, Ergocalciferol, (DRISDOL) 50000 UNITS CAPS capsule Take 50,000 Units by mouth every 7 (seven) days.       No current facility-administered medications for this visit.   Past Medical History  Diagnosis Date  . Seizures   . PTSD (post-traumatic stress disorder)   . GERD (gastroesophageal reflux disease)   . Panic attack   . Asthma   . Anxiety   . Schizophrenia   . Bipolar 1 disorder   . S/P endoscopy Feb 2012    Nl, s/p 56-French [facility] dilator, biopsy benign  . S/P endoscopy 2008    Mild gastritis  . History of flexible sigmoidoscopy 1999    Normal to 60cm,  . Diabetes mellitus without complication   . Ovarian cancer    Past Surgical History  Procedure Laterality Date  . Partial hysterectomy    . Cholecystectomy    . Tonsillectomy    . Hernia  repair      Incisional with mesh  . Colonoscopy  03/01/2011    Rourk-External hemorrhoids/otherwise normal rectum and colon.  . Esophagogastroduodenoscopy  2008    gastritis  . Esophagogastroduodenoscopy  2012    Moderate sized hiatal hernia, non-H. pylori gastritis   Family History  Problem Relation Age of Onset  . Crohn's disease Maternal Aunt   . Breast cancer Maternal Grandmother   . Colon cancer Maternal Grandfather   . Pancreatic cancer Maternal Grandfather   . Crohn's disease Cousin     x 2 cousins  . Breast cancer Daughter 103   History   Social History  . Marital Status: Married    Spouse Name: N/A    Number of Children: N/A  . Years of Education: N/A   Occupational History  . disabled    Social History Main Topics  . Smoking status: Current Some Day Smoker -- 0.25 packs/day for 26 years     Types: Cigarettes  . Smokeless tobacco: None     Comment: 2 or 3 cigarettes some days/ doesn't smoke everyday  . Alcohol Use: Yes     Comment: occa  . Drug Use: No  . Sexual Activity: Yes    Birth Control/ Protection: Surgical   Other Topics Concern  . None   Social History Narrative  . None    Allergies as of 10/29/2013 - Review Complete 10/29/2013  Allergen Reaction Noted  . Shellfish allergy Swelling 10/29/2013  . Codeine Other (See Comments)   . Hydrocodone  12/03/2012  . Ivp dye [iodinated diagnostic agents] Nausea And Vomiting 10/17/2010  . Latex Rash 03/08/2010    ROS:  General: Negative for anorexia, weight loss, fever, chills, fatigue, weakness. ENT: Negative for hoarseness, difficulty swallowing , nasal congestion. CV: Negative for chest pain, angina, palpitations, dyspnea on exertion, peripheral edema.  Respiratory: Negative for dyspnea at rest, dyspnea on exertion, cough, sputum, wheezing.  GI: See history of present illness. GU:  Negative for dysuria, hematuria, urinary incontinence, urinary frequency, nocturnal urination.  Endo: Negative for unusual weight change.    Physical Examination:   BP 104/77  Pulse 107  Temp(Src) 98.2 F (36.8 C) (Oral)  Ht 5\' 6"  (1.676 m)  Wt 234 lb (106.142 kg)  BMI 37.79 kg/m2  General: Well-nourished, well-developed in no acute distress.  Eyes: No icterus. Mouth: Oropharyngeal mucosa moist and pink , no lesions erythema or exudate. Lungs: Clear to auscultation bilaterally.  Heart: Regular rate and rhythm, no murmurs rubs or gallops.  Abdomen: Bowel sounds are normal, moderate epigastric/right upper quadrant tenderness, nondistended, no hepatosplenomegaly or masses, no abdominal bruits or hernia , no rebound or guarding.   Extremities: No lower extremity edema. No clubbing or deformities. Neuro: Alert and oriented x 4   Skin: Warm and dry, no jaundice.   Psych: Alert and cooperative, normal mood and affect.  Labs:    Requested from PCP  Imaging Studies: No results found.

## 2013-10-29 NOTE — Assessment & Plan Note (Signed)
43 year old lady with chronic GERD and constipation who presents with complaints of postprandial upper abdominal discomfort, abnormal weight loss, early satiety over the past couple of months. Weight is down nearly 30 pounds. She was diagnosed with diabetes about 6 months ago and started on metformin, low dose at that time. It is unclear, how much this has to do with her weight loss but less likely a factor in her abdominal pain. I have requested most recent labs from PCP for review. Make sure CBC and LFTs have been done. She'll continue pantoprazole 40 mg daily for now. She is no longer on NSAIDs or aspirin. Plan on upper endoscopy.  I have discussed the risks, alternatives, benefits with regards to but not limited to the risk of reaction to medication, bleeding, infection, perforation and the patient is agreeable to proceed. Written consent to be obtained. If negative we will plan on CT abdomen pelvis, premedicate for history of IVP allergy.

## 2013-11-03 ENCOUNTER — Other Ambulatory Visit: Payer: Self-pay | Admitting: Gastroenterology

## 2013-11-04 ENCOUNTER — Encounter (HOSPITAL_COMMUNITY): Payer: Self-pay | Admitting: Pharmacy Technician

## 2013-11-07 NOTE — Progress Notes (Signed)
cc'ed to pcp °

## 2013-11-17 ENCOUNTER — Encounter (HOSPITAL_COMMUNITY)
Admission: RE | Disposition: A | Discharge status: Home / Self Care | Payer: Self-pay | Source: Ambulatory Visit | Attending: Internal Medicine

## 2013-11-17 ENCOUNTER — Other Ambulatory Visit: Payer: Self-pay

## 2013-11-17 ENCOUNTER — Encounter (HOSPITAL_COMMUNITY): Payer: Self-pay | Admitting: *Deleted

## 2013-11-17 ENCOUNTER — Ambulatory Visit (HOSPITAL_COMMUNITY)
Admission: RE | Admit: 2013-11-17 | Discharge: 2013-11-17 | Disposition: A | Discharge status: Home / Self Care | Payer: Medicaid Other | Source: Ambulatory Visit | Attending: Internal Medicine | Admitting: Internal Medicine

## 2013-11-17 DIAGNOSIS — F319 Bipolar disorder, unspecified: Secondary | ICD-10-CM | POA: Insufficient documentation

## 2013-11-17 DIAGNOSIS — R569 Unspecified convulsions: Secondary | ICD-10-CM | POA: Insufficient documentation

## 2013-11-17 DIAGNOSIS — K228 Other specified diseases of esophagus: Secondary | ICD-10-CM | POA: Insufficient documentation

## 2013-11-17 DIAGNOSIS — F209 Schizophrenia, unspecified: Secondary | ICD-10-CM | POA: Diagnosis not present

## 2013-11-17 DIAGNOSIS — R1010 Upper abdominal pain, unspecified: Secondary | ICD-10-CM

## 2013-11-17 DIAGNOSIS — Z800 Family history of malignant neoplasm of digestive organs: Secondary | ICD-10-CM | POA: Insufficient documentation

## 2013-11-17 DIAGNOSIS — J45909 Unspecified asthma, uncomplicated: Secondary | ICD-10-CM | POA: Insufficient documentation

## 2013-11-17 DIAGNOSIS — R1084 Generalized abdominal pain: Secondary | ICD-10-CM

## 2013-11-17 DIAGNOSIS — F4310 Post-traumatic stress disorder, unspecified: Secondary | ICD-10-CM | POA: Insufficient documentation

## 2013-11-17 DIAGNOSIS — Z885 Allergy status to narcotic agent status: Secondary | ICD-10-CM | POA: Diagnosis not present

## 2013-11-17 DIAGNOSIS — F419 Anxiety disorder, unspecified: Secondary | ICD-10-CM | POA: Diagnosis not present

## 2013-11-17 DIAGNOSIS — Z91040 Latex allergy status: Secondary | ICD-10-CM | POA: Insufficient documentation

## 2013-11-17 DIAGNOSIS — F17210 Nicotine dependence, cigarettes, uncomplicated: Secondary | ICD-10-CM | POA: Insufficient documentation

## 2013-11-17 DIAGNOSIS — K210 Gastro-esophageal reflux disease with esophagitis: Secondary | ICD-10-CM | POA: Diagnosis not present

## 2013-11-17 DIAGNOSIS — Z8543 Personal history of malignant neoplasm of ovary: Secondary | ICD-10-CM | POA: Insufficient documentation

## 2013-11-17 DIAGNOSIS — Z91041 Radiographic dye allergy status: Secondary | ICD-10-CM | POA: Diagnosis not present

## 2013-11-17 DIAGNOSIS — Z91013 Allergy to seafood: Secondary | ICD-10-CM | POA: Insufficient documentation

## 2013-11-17 DIAGNOSIS — F410 Panic disorder [episodic paroxysmal anxiety] without agoraphobia: Secondary | ICD-10-CM | POA: Diagnosis not present

## 2013-11-17 DIAGNOSIS — R7309 Other abnormal glucose: Secondary | ICD-10-CM | POA: Diagnosis not present

## 2013-11-17 DIAGNOSIS — R109 Unspecified abdominal pain: Secondary | ICD-10-CM

## 2013-11-17 DIAGNOSIS — K2210 Ulcer of esophagus without bleeding: Secondary | ICD-10-CM

## 2013-11-17 DIAGNOSIS — R634 Abnormal weight loss: Secondary | ICD-10-CM

## 2013-11-17 HISTORY — PX: ESOPHAGOGASTRODUODENOSCOPY: SHX5428

## 2013-11-17 LAB — GLUCOSE, CAPILLARY: GLUCOSE-CAPILLARY: 101 mg/dL — AB (ref 70–99)

## 2013-11-17 SURGERY — EGD (ESOPHAGOGASTRODUODENOSCOPY)
Anesthesia: Moderate Sedation

## 2013-11-17 MED ORDER — LIDOCAINE VISCOUS 2 % MT SOLN
OROMUCOSAL | Status: AC
  Filled 2013-11-17: qty 15

## 2013-11-17 MED ORDER — MIDAZOLAM HCL 5 MG/5ML IJ SOLN
INTRAMUSCULAR | Status: AC
  Filled 2013-11-17: qty 10

## 2013-11-17 MED ORDER — PREDNISONE 50 MG PO TABS: ORAL_TABLET | ORAL | Status: DC

## 2013-11-17 MED ORDER — MEPERIDINE HCL 100 MG/ML IJ SOLN
INTRAMUSCULAR | Status: DC | PRN
  Administered 2013-11-17: 50 mg via INTRAVENOUS
  Administered 2013-11-17 (×2): 25 mg via INTRAVENOUS

## 2013-11-17 MED ORDER — ONDANSETRON HCL 4 MG/2ML IJ SOLN
INTRAMUSCULAR | Status: DC | PRN
  Administered 2013-11-17: 4 mg via INTRAVENOUS

## 2013-11-17 MED ORDER — LIDOCAINE VISCOUS 2 % MT SOLN
OROMUCOSAL | Status: DC | PRN
  Administered 2013-11-17: 2 via OROMUCOSAL

## 2013-11-17 MED ORDER — MIDAZOLAM HCL 5 MG/5ML IJ SOLN
INTRAMUSCULAR | Status: DC | PRN
  Administered 2013-11-17 (×2): 1 mg via INTRAVENOUS
  Administered 2013-11-17: 2 mg via INTRAVENOUS

## 2013-11-17 MED ORDER — MEPERIDINE HCL 100 MG/ML IJ SOLN
INTRAMUSCULAR | Status: AC
  Filled 2013-11-17: qty 2

## 2013-11-17 MED ORDER — SODIUM CHLORIDE 0.9 % IV SOLN
INTRAVENOUS | Status: DC
  Administered 2013-11-17: 09:00:00 via INTRAVENOUS

## 2013-11-17 MED ORDER — DIPHENHYDRAMINE HCL 50 MG PO CAPS: ORAL_CAPSULE | ORAL | Status: DC

## 2013-11-17 MED ORDER — STERILE WATER FOR IRRIGATION IR SOLN
Status: DC | PRN
  Administered 2013-11-17: 10:00:00

## 2013-11-17 MED ORDER — ONDANSETRON HCL 4 MG/2ML IJ SOLN
INTRAMUSCULAR | Status: AC
  Filled 2013-11-17: qty 2

## 2013-11-17 NOTE — Op Note (Signed)
[facility] [address] [place], [address]   ENDOSCOPY PROCEDURE REPORT  PATIENT: Jennifer [name], Jennifer [name]  MR#: [id number] BIRTHDATE: [birth year] , 42  yrs. old GENDER: female ENDOSCOPIST: R.  Garfield Cornea, MD FACP [facility] REFERRED BY:  Yevette Edwards, NP PROCEDURE DATE:  11-25-2013 PROCEDURE:  EGD, diagnostic INDICATIONS:  upper abdominal pain. MEDICATIONS: Versed 4 mg and Demerol 100 mg IV in divided doses. Zofran 4 mg IV.  Xylocaine gel orally. ASA CLASS:      Class II  CONSENT: The risks, benefits, limitations, alternatives and imponderables have been discussed.  The potential for biopsy, esophogeal dilation, etc. have also been reviewed.  Questions have been answered.  All parties agreeable.  Please see the history and physical in the medical record for more information.  DESCRIPTION OF PROCEDURE: After the risks benefits and alternatives of the procedure were thoroughly explained, informed consent was obtained.  The [id number] ([id number]) endoscope was introduced through the mouth and advanced to the second portion of the duodenum , limited by Without limitations. The instrument was slowly withdrawn as the mucosa was fully examined.    Tiny distal esophageal erosions at the GE junction.  No Barrett's esophagus.  No other esophageal abnormality .EG junction easily traversed with the scope.  Stomach empty.  Somewhat patulous EG junction otherwise, normal-appearing gastric mucosa.  Patent pylorus.  Normal-appearing first, second and third portion of the duodenum.  Retroflexed views revealed as previously described. The scope was then withdrawn from the patient and the procedure completed.  COMPLICATIONS: There were no immediate complications.  ENDOSCOPIC IMPRESSION: Mild erosive reflux esophagitis. Patulous EG junction  .Marland Kitchen  RECOMMENDATIONS: Increase Protonix to 40 mg twice daily. Proceed with CT scan of abdomen and pelvis as previously recommended as I feel the  mild abnormalities found in her esophagus are out of proportion to her symptoms. She will need premedication with Benadryl and prednisone.  REPEAT EXAM:  eSigned:  R. Garfield Cornea, MD Rosalita Chessman [facility] November 25, 2013 10:54 AM    CC:  CPT CODES: ICD CODES:  The ICD and CPT codes recommended by this software are interpretations from the data that the clinical staff has captured with the software.  The verification of the translation of this report to the ICD and CPT codes and modifiers is the sole responsibility of the health care institution and practicing physician where this report was generated.  [employer] [place] [employer]. will not be held responsible for the validity of the ICD and CPT codes included on this report.  AMA assumes no liability for data contained or not contained herein. CPT is a Designer, television/film set of the [employer].  PATIENT NAME:  Cathren, Sween MR#: [id number]

## 2013-11-17 NOTE — Interval H&P Note (Signed)
History and Physical Interval Note:  11/17/2013 9:56 AM  Jennifer [name]  has presented today for surgery, with the diagnosis of UPPER ABD PAIN/ABNORMAL WT LOSS  The various methods of treatment have been discussed with the patient and family. After consideration of risks, benefits and other options for treatment, the patient has consented to  Procedure(s) with comments: ESOPHAGOGASTRODUODENOSCOPY (EGD) (N/A) - 10:00 as a surgical intervention .  The patient's history has been reviewed, patient examined, no change in status, stable for surgery.  I have reviewed the patient's chart and labs.  Questions were answered to the patient's satisfaction.     Robert Rourk  No change. EGD per plan.The risks, benefits, limitations, alternatives and imponderables have been reviewed with the patient. Potential for esophageal dilation, biopsy, etc. have also been reviewed.  Questions have been answered. All parties agreeable.

## 2013-11-17 NOTE — Discharge Instructions (Signed)
EGD Discharge instructions Please read the instructions outlined below and refer to this sheet in the next few weeks. These discharge instructions provide you with general information on caring for yourself after you leave the hospital. Your doctor may also give you specific instructions. While your treatment has been planned according to the most current medical practices available, unavoidable complications occasionally occur. If you have any problems or questions after discharge, please call your doctor. ACTIVITY  You may resume your regular activity but move at a slower pace for the next 24 hours.   Take frequent rest periods for the next 24 hours.   Walking will help expel (get rid of) the air and reduce the bloated feeling in your abdomen.   No driving for 24 hours (because of the anesthesia (medicine) used during the test).   You may shower.   Do not sign any important legal documents or operate any machinery for 24 hours (because of the anesthesia used during the test).  NUTRITION  Drink plenty of fluids.   You may resume your normal diet.   Begin with a light meal and progress to your normal diet.   Avoid alcoholic beverages for 24 hours or as instructed by your caregiver.  MEDICATIONS  You may resume your normal medications unless your caregiver tells you otherwise.  WHAT YOU CAN EXPECT TODAY  You may experience abdominal discomfort such as a feeling of fullness or gas pains.  FOLLOW-UP  Your doctor will discuss the results of your test with you.  SEEK IMMEDIATE MEDICAL ATTENTION IF ANY OF THE FOLLOWING OCCUR:  Excessive nausea (feeling sick to your stomach) and/or vomiting.   Severe abdominal pain and distention (swelling).   Trouble swallowing.   Temperature over 101 F (37.8 C).   Rectal bleeding or vomiting of blood.   GERD information provided  Increase Protonix to 40 mg twice daily  We'll proceed with a contrast CT of abdomen and pelvis to further  evaluate upper abdominal pain. Patient will need to be premedicated with prednisone and Benadryl prior to the examination (prednisone 50 mg 13 hours before the procedure, 50 mg 7 hours before procedure and 1 hour before the procedure; also, patient needs Benadryl 50 mg one hour prior to the examination - all orally).  Further recommendations to follow. Gastroesophageal Reflux Disease, Adult Gastroesophageal reflux disease (GERD) happens when acid from your stomach flows up into the esophagus. When acid comes in contact with the esophagus, the acid causes soreness (inflammation) in the esophagus. Over time, GERD may create small holes (ulcers) in the lining of the esophagus. CAUSES   Increased body weight. This puts pressure on the stomach, making acid rise from the stomach into the esophagus.  Smoking. This increases acid production in the stomach.  Drinking alcohol. This causes decreased pressure in the lower esophageal sphincter (valve or ring of muscle between the esophagus and stomach), allowing acid from the stomach into the esophagus.  Late evening meals and a full stomach. This increases pressure and acid production in the stomach.  A malformed lower esophageal sphincter. Sometimes, no cause is found. SYMPTOMS   Burning pain in the lower part of the mid-chest behind the breastbone and in the mid-stomach area. This may occur twice a week or more often.  Trouble swallowing.  Sore throat.  Dry cough.  Asthma-like symptoms including chest tightness, shortness of breath, or wheezing. DIAGNOSIS  Your caregiver may be able to diagnose GERD based on your symptoms. In some cases, X-rays and  other tests may be done to check for complications or to check the condition of your stomach and esophagus. TREATMENT  Your caregiver may recommend over-the-counter or prescription medicines to help decrease acid production. Ask your caregiver before starting or adding any new medicines.  HOME CARE  INSTRUCTIONS   Change the factors that you can control. Ask your caregiver for guidance concerning weight loss, quitting smoking, and alcohol consumption.  Avoid foods and drinks that make your symptoms worse, such as:  Caffeine or alcoholic drinks.  Chocolate.  Peppermint or mint flavorings.  Garlic and onions.  Spicy foods.  Citrus fruits, such as oranges, lemons, or limes.  Tomato-based foods such as sauce, chili, salsa, and pizza.  Fried and fatty foods.  Avoid lying down for the 3 hours prior to your bedtime or prior to taking a nap.  Eat small, frequent meals instead of large meals.  Wear loose-fitting clothing. Do not wear anything tight around your waist that causes pressure on your stomach.  Raise the head of your bed 6 to 8 inches with wood blocks to help you sleep. Extra pillows will not help.  Only take over-the-counter or prescription medicines for pain, discomfort, or fever as directed by your caregiver.  Do not take aspirin, ibuprofen, or other nonsteroidal anti-inflammatory drugs (NSAIDs). SEEK IMMEDIATE MEDICAL CARE IF:   You have pain in your arms, neck, jaw, teeth, or back.  Your pain increases or changes in intensity or duration.  You develop nausea, vomiting, or sweating (diaphoresis).  You develop shortness of breath, or you faint.  Your vomit is green, yellow, black, or looks like coffee grounds or blood.  Your stool is red, bloody, or black. These symptoms could be signs of other problems, such as heart disease, gastric bleeding, or esophageal bleeding. MAKE SURE YOU:   Understand these instructions.  Will watch your condition.  Will get help right away if you are not doing well or get worse. Document Released: 11/02/2004 Document Revised: 04/17/2011 Document Reviewed: 08/12/2010 [facility] Patient Information 2015 [place], [place]. This information is not intended to replace advice given to you by your health care provider. Make sure you  discuss any questions you have with your health care provider.

## 2013-11-17 NOTE — H&P (View-Only) (Signed)
Primary Care Physician: Yevette Edwards, NP  Primary Gastroenterologist:  Garfield Cornea, MD   Chief Complaint  Patient presents with  . Abdominal Pain    HPI: Jennifer [name] is a 43 y.o. female here for further evaluation of abdominal pain and abnormal weight loss. She was last seen by our practice in July 2014 for refractory GERD, constipation. Last EGD by Dr. Gala Romney, 2012 showed non-H. pylori gastritis, moderate hiatal hernia, empirically dilated with a 56 French [employer] dilator. Last colonoscopy by Dr. Buford Dresser in January 2013 showed external hemorrhoids.  One month history of upper abdominal pain, more on the RUQ. Some vomiting with coughing spells, vomit acid. Some postpranidal pain, avoiding food. Eats small amounts at a time. Weight down nearly 30 pounds in the past couple of months. No heartburn. Some nocturnal symptoms. Intermittent abdominal pain. BM QOD. Soft stool. No melena, brbpr. Not really looking though. Diagnosed with pre-diabetes 6 months ago. Started metformin 500mg  at nighttime, at that time. May have to come off of metformin since weight loss. No longer on Mobic.   PCP suggesting upper endoscopy for abdominal pain and weight loss.  Current Outpatient Prescriptions  Medication Sig Dispense Refill  . albuterol (PROVENTIL) (2.5 MG/3ML) 0.083% nebulizer solution Take 2.5 mg by nebulization every 6 (six) hours as needed for wheezing.      Marland Kitchen AMITIZA 8 MCG capsule TAKE 1 CAPSULE BY MOUTH TWICE DAILY.  60 capsule  11  . cetirizine (ZYRTEC) 10 MG tablet Take 10 mg by mouth every morning.      . citalopram (CELEXA) 40 MG tablet Take 40 mg by mouth daily.      Marland Kitchen estradiol (ESTRACE) 0.1 MG/GM vaginal cream Place 2 g vaginally daily.       . fluticasone (FLONASE) 50 MCG/ACT nasal spray Place 1 spray into the nose 2 (two) times daily.       Marland Kitchen levETIRAcetam (KEPPRA) 750 MG tablet Take 2,250 mg by mouth at bedtime.       Marland Kitchen METFORMIN HCL ER PO Take 500 mg by mouth at bedtime.      Marland Kitchen  oxybutynin (DITROPAN) 5 MG tablet Take 5 mg by mouth 2 (two) times daily.      . paliperidone (INVEGA) 3 MG 24 hr tablet Take 6 mg by mouth at bedtime.       . pantoprazole (PROTONIX) 40 MG tablet TAKE 1 TABLET ONCE DAILY.  30 tablet  5  . traZODone (DESYREL) 50 MG tablet Take 50 mg by mouth at bedtime.      . Vitamin D, Ergocalciferol, (DRISDOL) 50000 UNITS CAPS capsule Take 50,000 Units by mouth every 7 (seven) days.       No current facility-administered medications for this visit.   Past Medical History  Diagnosis Date  . Seizures   . PTSD (post-traumatic stress disorder)   . GERD (gastroesophageal reflux disease)   . Panic attack   . Asthma   . Anxiety   . Schizophrenia   . Bipolar 1 disorder   . S/P endoscopy Feb 2012    Nl, s/p 56-French [facility] dilator, biopsy benign  . S/P endoscopy 2008    Mild gastritis  . History of flexible sigmoidoscopy 1999    Normal to 60cm,  . Diabetes mellitus without complication   . Ovarian cancer    Past Surgical History  Procedure Laterality Date  . Partial hysterectomy    . Cholecystectomy    . Tonsillectomy    . Hernia  repair      Incisional with mesh  . Colonoscopy  03/01/2011    Rourk-External hemorrhoids/otherwise normal rectum and colon.  . Esophagogastroduodenoscopy  2008    gastritis  . Esophagogastroduodenoscopy  2012    Moderate sized hiatal hernia, non-H. pylori gastritis   Family History  Problem Relation Age of Onset  . Crohn's disease Maternal Aunt   . Breast cancer Maternal Grandmother   . Colon cancer Maternal Grandfather   . Pancreatic cancer Maternal Grandfather   . Crohn's disease Cousin     x 2 cousins  . Breast cancer Daughter 34   History   Social History  . Marital Status: Married    Spouse Name: N/A    Number of Children: N/A  . Years of Education: N/A   Occupational History  . disabled    Social History Main Topics  . Smoking status: Current Some Day Smoker -- 0.25 packs/day for 26 years     Types: Cigarettes  . Smokeless tobacco: None     Comment: 2 or 3 cigarettes some days/ doesn't smoke everyday  . Alcohol Use: Yes     Comment: occa  . Drug Use: No  . Sexual Activity: Yes    Birth Control/ Protection: Surgical   Other Topics Concern  . None   Social History Narrative  . None    Allergies as of 10/29/2013 - Review Complete 10/29/2013  Allergen Reaction Noted  . Shellfish allergy Swelling 10/29/2013  . Codeine Other (See Comments)   . Hydrocodone  12/03/2012  . Ivp dye [iodinated diagnostic agents] Nausea And Vomiting 10/17/2010  . Latex Rash 03/08/2010    ROS:  General: Negative for anorexia, weight loss, fever, chills, fatigue, weakness. ENT: Negative for hoarseness, difficulty swallowing , nasal congestion. CV: Negative for chest pain, angina, palpitations, dyspnea on exertion, peripheral edema.  Respiratory: Negative for dyspnea at rest, dyspnea on exertion, cough, sputum, wheezing.  GI: See history of present illness. GU:  Negative for dysuria, hematuria, urinary incontinence, urinary frequency, nocturnal urination.  Endo: Negative for unusual weight change.    Physical Examination:   BP 104/77  Pulse 107  Temp(Src) 98.2 F (36.8 C) (Oral)  Ht 5\' 6"  (1.676 m)  Wt 234 lb (106.142 kg)  BMI 37.79 kg/m2  General: Well-nourished, well-developed in no acute distress.  Eyes: No icterus. Mouth: Oropharyngeal mucosa moist and pink , no lesions erythema or exudate. Lungs: Clear to auscultation bilaterally.  Heart: Regular rate and rhythm, no murmurs rubs or gallops.  Abdomen: Bowel sounds are normal, moderate epigastric/right upper quadrant tenderness, nondistended, no hepatosplenomegaly or masses, no abdominal bruits or hernia , no rebound or guarding.   Extremities: No lower extremity edema. No clubbing or deformities. Neuro: Alert and oriented x 4   Skin: Warm and dry, no jaundice.   Psych: Alert and cooperative, normal mood and affect.  Labs:    Requested from PCP  Imaging Studies: No results found.

## 2013-11-19 ENCOUNTER — Encounter (HOSPITAL_COMMUNITY): Payer: Self-pay | Admitting: Internal Medicine

## 2013-11-20 ENCOUNTER — Ambulatory Visit (HOSPITAL_COMMUNITY)
Admission: RE | Admit: 2013-11-20 | Discharge: 2013-11-20 | Disposition: A | Discharge status: Home / Self Care | Payer: Medicaid Other | Source: Ambulatory Visit | Attending: Internal Medicine | Admitting: Internal Medicine

## 2013-11-20 DIAGNOSIS — R1010 Upper abdominal pain, unspecified: Secondary | ICD-10-CM | POA: Diagnosis present

## 2013-11-20 DIAGNOSIS — R634 Abnormal weight loss: Secondary | ICD-10-CM

## 2013-11-20 DIAGNOSIS — K769 Liver disease, unspecified: Secondary | ICD-10-CM | POA: Insufficient documentation

## 2013-11-20 LAB — POCT I-STAT CREATININE: Creatinine, Ser: 1.3 mg/dL — ABNORMAL HIGH (ref 0.50–1.10)

## 2013-11-20 MED ORDER — IOHEXOL 300 MG/ML  SOLN
100.0000 mL | Freq: Once | INTRAMUSCULAR | Status: AC | PRN
  Administered 2013-11-20: 100 mL via INTRAVENOUS

## 2013-12-01 ENCOUNTER — Other Ambulatory Visit: Payer: Self-pay

## 2013-12-01 DIAGNOSIS — K769 Liver disease, unspecified: Secondary | ICD-10-CM

## 2013-12-03 ENCOUNTER — Other Ambulatory Visit: Payer: Self-pay | Admitting: Gastroenterology

## 2013-12-08 ENCOUNTER — Ambulatory Visit (HOSPITAL_COMMUNITY)
Admission: RE | Admit: 2013-12-08 | Discharge: 2013-12-08 | Disposition: A | Discharge status: Home / Self Care | Payer: Medicaid Other | Source: Ambulatory Visit | Attending: Internal Medicine | Admitting: Internal Medicine

## 2013-12-08 DIAGNOSIS — K769 Liver disease, unspecified: Secondary | ICD-10-CM | POA: Diagnosis not present

## 2013-12-08 DIAGNOSIS — R109 Unspecified abdominal pain: Secondary | ICD-10-CM | POA: Diagnosis not present

## 2013-12-08 DIAGNOSIS — R110 Nausea: Secondary | ICD-10-CM | POA: Diagnosis not present

## 2013-12-08 MED ORDER — GADOBENATE DIMEGLUMINE 529 MG/ML IV SOLN
20.0000 mL | Freq: Once | INTRAVENOUS | Status: AC | PRN
  Administered 2013-12-08: 20 mL via INTRAVENOUS

## 2013-12-08 NOTE — Progress Notes (Signed)
Quick Note:  Benign lesion on CT/MRI. No further imaging for it recommended. Needs OV With [facility] in coming weeks if not already scheduled if not already scheduled ______

## 2013-12-12 ENCOUNTER — Encounter: Payer: Self-pay | Admitting: Internal Medicine

## 2013-12-12 NOTE — Progress Notes (Signed)
APPT MADE AND LETTER SENT  °

## 2013-12-17 NOTE — Progress Notes (Addendum)
Reviewed copy of labs from 09/2013. Copy poor but following results noted. Total bilirubin 0.6 alkaline phosphatase 77, AST 18, ALT 21, albumin 4.1, white blood cell count 7700, hemoglobin 13.1, hematocrit 40.1, TSH 2.131, hemoglobin A1c 5.6, MCV 73.6  Patient has upcoming follow up appt.

## 2013-12-19 ENCOUNTER — Telehealth: Payer: Self-pay

## 2013-12-19 MED ORDER — PANTOPRAZOLE SODIUM 40 MG PO TBEC: 40.0000 mg | DELAYED_RELEASE_TABLET | Freq: Two times a day (BID) | ORAL | Status: DC

## 2013-12-19 NOTE — Telephone Encounter (Signed)
RMR changed protonix to bid after EGD. Received fax from [facility], they need a new rx. Per [facility] ok to send in with 3 refills. Pt has ov with [facility] coming up in December. rx sent in.

## 2013-12-19 NOTE — Telephone Encounter (Signed)
Agree 

## 2013-12-30 ENCOUNTER — Other Ambulatory Visit (HOSPITAL_COMMUNITY): Payer: Self-pay | Admitting: Nurse Practitioner

## 2013-12-30 DIAGNOSIS — Z1231 Encounter for screening mammogram for malignant neoplasm of breast: Secondary | ICD-10-CM

## 2013-12-30 DIAGNOSIS — N63 Unspecified lump in unspecified breast: Secondary | ICD-10-CM

## 2014-01-13 ENCOUNTER — Other Ambulatory Visit (HOSPITAL_COMMUNITY): Payer: Self-pay | Admitting: Nurse Practitioner

## 2014-01-13 ENCOUNTER — Ambulatory Visit (HOSPITAL_COMMUNITY)
Admission: RE | Admit: 2014-01-13 | Discharge: 2014-01-13 | Disposition: A | Discharge status: Home / Self Care | Payer: Medicaid Other | Source: Ambulatory Visit | Attending: Nurse Practitioner | Admitting: Nurse Practitioner

## 2014-01-13 DIAGNOSIS — N63 Unspecified lump in unspecified breast: Secondary | ICD-10-CM

## 2014-01-13 DIAGNOSIS — N6310 Unspecified lump in the right breast, unspecified quadrant: Secondary | ICD-10-CM

## 2014-01-19 ENCOUNTER — Encounter: Payer: Self-pay | Admitting: Gastroenterology

## 2014-01-19 ENCOUNTER — Ambulatory Visit (INDEPENDENT_AMBULATORY_CARE_PROVIDER_SITE_OTHER): Payer: Medicaid Other | Admitting: Gastroenterology

## 2014-01-19 VITALS — BP 116/82 | HR 76 | Temp 97.8°F | Ht 66.0 in | Wt 231.2 lb

## 2014-01-19 DIAGNOSIS — K2100 Gastro-esophageal reflux disease with esophagitis, without bleeding: Secondary | ICD-10-CM

## 2014-01-19 DIAGNOSIS — K5909 Other constipation: Secondary | ICD-10-CM

## 2014-01-19 DIAGNOSIS — R109 Unspecified abdominal pain: Secondary | ICD-10-CM

## 2014-01-19 DIAGNOSIS — R300 Dysuria: Secondary | ICD-10-CM

## 2014-01-19 MED ORDER — DEXLANSOPRAZOLE 60 MG PO CPDR: 60.0000 mg | DELAYED_RELEASE_CAPSULE | Freq: Every day | ORAL | Status: DC

## 2014-01-19 MED ORDER — LUBIPROSTONE 24 MCG PO CAPS: 24.0000 ug | ORAL_CAPSULE | Freq: Two times a day (BID) | ORAL | Status: DC

## 2014-01-19 NOTE — Assessment & Plan Note (Addendum)
Abdominal pain in multiple locations. Recent CT, MRI reassuring. Likely contributing factor is unmanaged constipation, GERD, +/- musculoskeletal, IBS. Increase Amitiza as outlined. Change PPI to Dexilant. Labs and U/A. OV in 3 months with Dr. Gala Romney.

## 2014-01-19 NOTE — Patient Instructions (Signed)
1. New prescription for Amitiza 32mcg twice daily with food sent to [facility].  2. New prescription for Dexilant to take once daily before breakfast for reflux sent to [facility]. We will have to fill out prior authorization to get it approved which can take several days. Samples provided in the meantime. 3. Please have your labs and urine specimen done. 4. Please return to office in 3 months.

## 2014-01-19 NOTE — Progress Notes (Signed)
cc'ed to pcp °

## 2014-01-19 NOTE — Assessment & Plan Note (Signed)
Increase Amitiza to 25mcg BID with food. CT showed moderate constipation. Better adequate control of her constipation may improve her vague abdominal pain.

## 2014-01-19 NOTE — Assessment & Plan Note (Signed)
Continued complaints of reflux even on pantoprazole BID. Suspect some relationship with dietary indiscretion. Mild erosive reflux esophagitis recent EGD. Stop pantoprazole. Trial of Dexilant once daily before breakfast. Ov in 3 months.

## 2014-01-19 NOTE — Progress Notes (Signed)
Primary Care Physician: Wendie Simmer, MD  Primary Gastroenterologist:  Garfield Cornea, MD   Chief Complaint  Patient presents with  . Abdominal Pain    HPI: Jennifer [name] is a 43 y.o. female here for follow-up of abdominal pain. She had an EGD done in October by Dr. Gala Romney which showed mild erosive reflux esophagitis. Her protonix's was increased to twice daily. She had a CT of the abdomen and pelvis with contrast to further evaluate her abdominal pain. She had a moderate amount of stool throughout the colon. 3 cm exophytic liver lesion felt to be most likely benign hepatic adenoma or focal nodular hyperplasia. Lesion was actually present but difficult to see back in 2012 on noncontrast study. MRI was recommended. Noted to have essentially stable benign-appearing cystic lesion along the inferior margin of the liver compared to 2005. No further workup needed.  Continues with multiple complaints. BM may skip 4 to 5 days. Sometimes very hard. Uses metamucil powder prn only. If takes every day, bowels too soft, incomplete evacuation. Taking Amitiza, needs updated prescription, "stealing" from the next day to have enough to take 34mcg BID.  Complains of dull/stabbing pain in RLQ/LUQ. On occasion. Epigastric pain persist. Epigastric pain worse with meals. Some LUQ pain better with positional, more persistent than others. Feels tight when taking deep breath. Some dysuria. Patient is worried she has inflammation in her body that explains all of her symptoms. Complains of refractory GERD, constant urged to clear out throat. No dysphagia or strangling with liquids. Weight stable more recently.  Current Outpatient Prescriptions  Medication Sig Dispense Refill  . albuterol (PROVENTIL) (2.5 MG/3ML) 0.083% nebulizer solution Take 2.5 mg by nebulization every 6 (six) hours as needed for wheezing.    Marland Kitchen AMITIZA 8 MCG capsule TAKE 1 CAPSULE BY MOUTH TWICE DAILY. 60 capsule 5  . cetirizine (ZYRTEC) 10  MG tablet Take 10 mg by mouth every morning.    . citalopram (CELEXA) 40 MG tablet Take 40 mg by mouth daily.    Marland Kitchen estradiol (ESTRACE) 0.1 MG/GM vaginal cream Place 2 g vaginally daily.     . fluticasone (FLONASE) 50 MCG/ACT nasal spray Place 1 spray into the nose 2 (two) times daily.     Marland Kitchen levETIRAcetam (KEPPRA) 750 MG tablet Take 2,250 mg by mouth at bedtime.     Marland Kitchen oxybutynin (DITROPAN) 5 MG tablet Take 5 mg by mouth 2 (two) times daily.    . paliperidone (INVEGA) 3 MG 24 hr tablet Take 6 mg by mouth at bedtime.     . pantoprazole (PROTONIX) 40 MG tablet Take 1 tablet (40 mg total) by mouth 2 (two) times daily. 60 tablet 3  . traZODone (DESYREL) 50 MG tablet Take 50 mg by mouth at bedtime.    . Vitamin D, Ergocalciferol, (DRISDOL) 50000 UNITS CAPS capsule Take 50,000 Units by mouth every 7 (seven) days.     No current facility-administered medications for this visit.    Allergies as of 01/19/2014 - Review Complete 01/19/2014  Allergen Reaction Noted  . Shellfish allergy Swelling 10/29/2013  . Codeine Other (See Comments)   . Hydrocodone  12/03/2012  . Ivp dye [iodinated diagnostic agents] Nausea And Vomiting 10/17/2010  . Latex Rash 03/08/2010   Past Medical History  Diagnosis Date  . Seizures   . PTSD (post-traumatic stress disorder)   . GERD (gastroesophageal reflux disease)   . Panic attack   . Asthma   . Anxiety   . Schizophrenia   .  Bipolar 1 disorder   . S/P endoscopy Feb 2012    Nl, s/p 56-French [facility] dilator, biopsy benign  . S/P endoscopy 2008    Mild gastritis  . History of flexible sigmoidoscopy 1999    Normal to 60cm,  . Diabetes mellitus without complication   . Ovarian cancer    Past Surgical History  Procedure Laterality Date  . Partial hysterectomy    . Cholecystectomy    . Tonsillectomy    . Hernia repair      Incisional with mesh  . Colonoscopy  03/01/2011    Rourk-External hemorrhoids/otherwise normal rectum and colon.  .  Esophagogastroduodenoscopy  2008    gastritis  . Esophagogastroduodenoscopy  2012    Moderate sized hiatal hernia, non-H. pylori gastritis  . Esophagogastroduodenoscopy N/A 11/17/2013    RMR: Mild erosive reflux esophagitis. Patulous EG junction    ROS:  General: Negative for anorexia, weight loss, fever, chills, fatigue, weakness. ENT: Negative for hoarseness, difficulty swallowing, nasal congestion. CV: Negative for chest pain, angina, palpitations, dyspnea on exertion, peripheral edema.  Respiratory: Negative for dyspnea at rest, dyspnea on exertion, cough, sputum, wheezing.  GI: See history of present illness. GU:  Negative for dysuria, hematuria, urinary incontinence, urinary frequency, nocturnal urination.  Endo: Negative for unusual weight change.    Physical Examination:   BP 116/82 mmHg  Pulse 76  Temp(Src) 97.8 F (36.6 C) (Oral)  Ht 5\' 6"  (1.676 m)  Wt 231 lb 3.2 oz (104.872 kg)  BMI 37.33 kg/m2  General: Well-nourished, well-developed in no acute distress.  Eyes: No icterus. Mouth: Oropharyngeal mucosa moist and pink , no lesions erythema or exudate. Lungs: Clear to auscultation bilaterally.  Heart: Regular rate and rhythm, no murmurs rubs or gallops.  Abdomen: Bowel sounds are normal, mild tenderness in epigastrium, luq, lower abd with moderate palpation, nondistended, no hepatosplenomegaly or masses, no abdominal bruits or hernia , no rebound or guarding.   Extremities: No lower extremity edema. No clubbing or deformities. Neuro: Alert and oriented x 4   Skin: Warm and dry, no jaundice.   Psych: Alert and cooperative, normal mood and affect.

## 2014-01-19 NOTE — Assessment & Plan Note (Signed)
Check U/A with culture reflex.

## 2014-01-23 LAB — CBC WITH DIFFERENTIAL/PLATELET
Basophils Absolute: 0 10*3/uL (ref 0.0–0.1)
Basophils Relative: 0 % (ref 0–1)
EOS PCT: 1 % (ref 0–5)
Eosinophils Absolute: 0.1 10*3/uL (ref 0.0–0.7)
HCT: 38.9 % (ref 36.0–46.0)
Hemoglobin: 12.2 g/dL (ref 12.0–15.0)
LYMPHS PCT: 50 % — AB (ref 12–46)
Lymphs Abs: 3.7 10*3/uL (ref 0.7–4.0)
MCH: 24 pg — AB (ref 26.0–34.0)
MCHC: 31.4 g/dL (ref 30.0–36.0)
MCV: 76.4 fL — ABNORMAL LOW (ref 78.0–100.0)
MONO ABS: 0.4 10*3/uL (ref 0.1–1.0)
MONOS PCT: 6 % (ref 3–12)
MPV: 10.1 fL (ref 9.4–12.4)
NEUTROS ABS: 3.1 10*3/uL (ref 1.7–7.7)
Neutrophils Relative %: 43 % (ref 43–77)
Platelets: 299 10*3/uL (ref 150–400)
RBC: 5.09 MIL/uL (ref 3.87–5.11)
RDW: 16.9 % — ABNORMAL HIGH (ref 11.5–15.5)
WBC: 7.3 10*3/uL (ref 4.0–10.5)

## 2014-01-23 LAB — URINALYSIS W MICROSCOPIC + REFLEX CULTURE
BILIRUBIN URINE: NEGATIVE
CASTS: NONE SEEN
Crystals: NONE SEEN
Glucose, UA: NEGATIVE mg/dL
HGB URINE DIPSTICK: NEGATIVE
Ketones, ur: NEGATIVE mg/dL
LEUKOCYTES UA: NEGATIVE
NITRITE: NEGATIVE
PH: 6 (ref 5.0–8.0)
PROTEIN: NEGATIVE mg/dL
Specific Gravity, Urine: 1.023 (ref 1.005–1.030)
UROBILINOGEN UA: 0.2 mg/dL (ref 0.0–1.0)

## 2014-01-23 LAB — LIPASE: Lipase: 25 U/L (ref 0–75)

## 2014-01-23 LAB — SEDIMENTATION RATE: Sed Rate: 12 mm/hr (ref 0–22)

## 2014-01-23 LAB — COMPREHENSIVE METABOLIC PANEL
ALT: 12 U/L (ref 0–35)
AST: 15 U/L (ref 0–37)
Albumin: 3.5 g/dL (ref 3.5–5.2)
Alkaline Phosphatase: 76 U/L (ref 39–117)
BUN: 12 mg/dL (ref 6–23)
CO2: 21 mEq/L (ref 19–32)
Calcium: 9.3 mg/dL (ref 8.4–10.5)
Chloride: 107 mEq/L (ref 96–112)
Creat: 1.13 mg/dL — ABNORMAL HIGH (ref 0.50–1.10)
Glucose, Bld: 145 mg/dL — ABNORMAL HIGH (ref 70–99)
POTASSIUM: 3.7 meq/L (ref 3.5–5.3)
SODIUM: 139 meq/L (ref 135–145)
Total Bilirubin: 0.4 mg/dL (ref 0.2–1.2)
Total Protein: 6.4 g/dL (ref 6.0–8.3)

## 2014-01-23 LAB — TSH: TSH: 2.478 u[IU]/mL (ref 0.350–4.500)

## 2014-01-23 LAB — C-REACTIVE PROTEIN: CRP: 1.7 mg/dL — AB (ref <0.60)

## 2014-02-09 ENCOUNTER — Ambulatory Visit (HOSPITAL_COMMUNITY): Payer: Medicaid Other

## 2014-02-16 NOTE — Progress Notes (Signed)
Quick Note:  Please touch base with patient to see how she is doing. Her labs were ok except her CRP is slightly elevated, may be sign of inflammation/infection/or even insignificant. ______

## 2014-04-06 ENCOUNTER — Encounter: Payer: Self-pay | Admitting: Adult Health

## 2014-04-06 ENCOUNTER — Ambulatory Visit (INDEPENDENT_AMBULATORY_CARE_PROVIDER_SITE_OTHER): Payer: Medicaid Other | Admitting: Adult Health

## 2014-04-06 VITALS — BP 102/88 | HR 113

## 2014-04-06 DIAGNOSIS — Z136 Encounter for screening for cardiovascular disorders: Secondary | ICD-10-CM

## 2014-04-06 DIAGNOSIS — R0602 Shortness of breath: Secondary | ICD-10-CM

## 2014-04-06 NOTE — Progress Notes (Deleted)
Name: Jennifer [name]    DOB: Sep 27, 1970  Age: 44 y.o.  MR#: [id number]       PCP:  Deloria Lair, MD      Insurance: Payor: MEDICAID [address] / Plan: MEDICAID [place] ACCESS / Product Type: *No Product type* /   CC:    Chief Complaint  Patient presents with  . Chest Pain  . Palpitations    VS Filed Vitals:   04/06/14 1457  BP: 102/88  Pulse: 113  SpO2: 98%    Weights Current Weight  01/19/14 231 lb 3.2 oz (104.872 kg)  12/08/13 235 lb (106.595 kg)  11/17/13 234 lb (106.142 kg)    Blood Pressure  BP Readings from Last 3 Encounters:  04/06/14 102/88  01/19/14 116/82  11/17/13 107/73     Admit date:  (Not on file) Last encounter with RMR:  Visit date not found   Allergy Shellfish allergy; Codeine; Hydrocodone; Ivp dye; and Latex  Current Outpatient Prescriptions  Medication Sig Dispense Refill  . albuterol (PROVENTIL) (2.5 MG/3ML) 0.083% nebulizer solution Take 2.5 mg by nebulization every 6 (six) hours as needed for wheezing.    . cetirizine (ZYRTEC) 10 MG tablet Take 10 mg by mouth every morning.    . citalopram (CELEXA) 40 MG tablet Take 40 mg by mouth daily.    Marland Kitchen dexlansoprazole (DEXILANT) 60 MG capsule Take 1 capsule (60 mg total) by mouth daily. 60 capsule 5  . estradiol (ESTRACE) 0.1 MG/GM vaginal cream Place 2 g vaginally daily.     . fluticasone (FLONASE) 50 MCG/ACT nasal spray Place 1 spray into the nose 2 (two) times daily.     Marland Kitchen levETIRAcetam (KEPPRA) 750 MG tablet Take 2,250 mg by mouth at bedtime.     Marland Kitchen lubiprostone (AMITIZA) 24 MCG capsule Take 1 capsule (24 mcg total) by mouth 2 (two) times daily with a meal. 60 capsule 3  . ondansetron (ZOFRAN) 4 MG tablet Take 4 mg by mouth every 8 (eight) hours as needed for nausea or vomiting.    Marland Kitchen oxybutynin (DITROPAN) 5 MG tablet Take 5 mg by mouth 2 (two) times daily.    . traZODone (DESYREL) 50 MG tablet Take 50 mg by mouth at bedtime.    . Vitamin D, Ergocalciferol, (DRISDOL) 50000 UNITS CAPS capsule Take 50,000 Units  by mouth every 7 (seven) days.    . paliperidone (INVEGA) 3 MG 24 hr tablet Take 6 mg by mouth at bedtime.      No current facility-administered medications for this visit.    Discontinued Meds:   There are no discontinued medications.  Patient Active Problem List   Diagnosis Date Noted  . Dysuria 01/19/2014  . Upper abdominal pain 10/29/2013  . Abnormal weight loss 10/29/2013  . Hiatal hernia 08/20/2012  . Obesity 11/22/2011  . Atypical chest pain 09/21/2011  . HTN (hypertension) 09/21/2011  . Palpitations 09/21/2011  . Chondromalacia of left patella 05/23/2011  . Patellofemoral pain syndrome 04/24/2011  . Hematochezia 02/22/2011  . Knee pain 12/28/2010  . Muscle weakness (generalized) 12/28/2010  . Dysphagia 05/19/2010  . Vaginal discharge 05/19/2010  . *** LATEX ALLERGY *** 03/08/2010  . PSYCHIATRIC DISORDER 03/10/2009  . ANEMIA 03/09/2009  . GASTROESOPHAGEAL REFLUX DISEASE, CHRONIC 03/09/2009  . Constipation 03/09/2009  . SEIZURE DISORDER 03/09/2009  . INSOMNIA 03/09/2009  . NAUSEA 03/09/2009  . Abdominal pain 03/09/2009  . DERANGEMENT MENISCUS 11/11/2008  . H N P-LUMBAR 11/11/2008  . SCIATICA 11/11/2008    LABS    Component  Value Date/Time   NA 139 01/23/2014 0814   NA 140 02/08/2013 1340   NA 136 12/03/2012 1244   K 3.7 01/23/2014 0814   K 3.8 02/08/2013 1340   K 4.0 12/03/2012 1244   K 4.1 02/23/2012   CL 107 01/23/2014 0814   CL 105 02/08/2013 1340   CL 101 12/03/2012 1244   CO2 21 01/23/2014 0814   CO2 23 02/08/2013 1340   CO2 25 12/03/2012 1244   GLUCOSE 145* 01/23/2014 0814   GLUCOSE 109* 02/08/2013 1340   GLUCOSE 95 12/03/2012 1244   BUN 12 01/23/2014 0814   BUN 19 02/08/2013 1340   BUN 11 12/03/2012 1244   BUN 19 02/23/2012   CREATININE 1.13* 01/23/2014 0814   CREATININE 1.30* 11/20/2013 0848   CREATININE 1.20* 02/08/2013 1340   CREATININE 1.10 12/03/2012 1244   CREATININE 1.25 02/23/2012   CREATININE 1.17 05/18/2010 1609   CALCIUM 9.3  01/23/2014 0814   CALCIUM 9.3 02/08/2013 1340   CALCIUM 9.4 12/03/2012 1244   CALCIUM 8.9 02/23/2012   GFRNONAA 55* 02/08/2013 1340   GFRNONAA 61* 12/03/2012 1244   GFRNONAA 60* 02/23/2010 1444   GFRAA 64* 02/08/2013 1340   GFRAA 71* 12/03/2012 1244   GFRAA  02/23/2010 1444    >60        The eGFR has been calculated using the MDRD equation. This calculation has not been validated in all clinical situations. eGFR's persistently <60 mL/min signify possible Chronic Kidney Disease.   CMP     Component Value Date/Time   NA 139 01/23/2014 0814   K 3.7 01/23/2014 0814   K 4.1 02/23/2012   CL 107 01/23/2014 0814   CO2 21 01/23/2014 0814   GLUCOSE 145* 01/23/2014 0814   BUN 12 01/23/2014 0814   BUN 19 02/23/2012   CREATININE 1.13* 01/23/2014 0814   CREATININE 1.30* 11/20/2013 0848   CALCIUM 9.3 01/23/2014 0814   CALCIUM 8.9 02/23/2012   PROT 6.4 01/23/2014 0814   ALBUMIN 3.5 01/23/2014 0814   AST 15 01/23/2014 0814   AST 13 02/23/2012   ALT 12 01/23/2014 0814   ALKPHOS 76 01/23/2014 0814   ALKPHOS 71 02/23/2012   BILITOT 0.4 01/23/2014 0814   BILITOT 0.1 02/23/2012   GFRNONAA 55* 02/08/2013 1340   GFRAA 64* 02/08/2013 1340       Component Value Date/Time   WBC 7.3 01/23/2014 0814   WBC 9.6 06/09/2013 1636   WBC 6.3 02/08/2013 1319   HGB 12.2 01/23/2014 0814   HGB 12.5 06/09/2013 1636   HGB 11.7* 02/08/2013 1319   HCT 38.9 01/23/2014 0814   HCT 38.2 06/09/2013 1636   HCT 36.5 02/08/2013 1319   MCV 76.4* 01/23/2014 0814   MCV 72.6* 06/09/2013 1636   MCV 72.6* 02/08/2013 1319    Lipid Panel  No results found for: CHOL, TRIG, HDL, CHOLHDL, VLDL, LDLCALC, LDLDIRECT  ABG No results found for: PHART, PCO2ART, PO2ART, HCO3, TCO2, ACIDBASEDEF, O2SAT   Lab Results  Component Value Date   TSH 2.478 01/23/2014   BNP (last 3 results) No results for input(s): BNP in the last 8760 hours.  ProBNP (last 3 results) No results for input(s): PROBNP in the last 8760  hours.  Cardiac Panel (last 3 results) No results for input(s): CKTOTAL, CKMB, TROPONINI, RELINDX in the last 72 hours.  Iron/TIBC/Ferritin/ %Sat No results found for: IRON, TIBC, FERRITIN, IRONPCTSAT   EKG Orders placed or performed in visit on 06/09/13  . EKG 12-Lead  Prior Assessment and Plan Problem List as of 04/06/2014      Cardiovascular and Mediastinum   HTN (hypertension)   Last Assessment & Plan 06/17/2013 Office Visit Written 06/17/2013  1:51 PM by Lendon Colonel, NP    BP is well controlled. No changes. She will be seen in 6 months.        Respiratory   Hiatal hernia     Digestive   GASTROESOPHAGEAL REFLUX DISEASE, CHRONIC   Last Assessment & Plan 01/19/2014 Office Visit Written 01/19/2014 12:41 PM by [name], PA-C    Continued complaints of reflux even on pantoprazole BID. Suspect some relationship with dietary indiscretion. Mild erosive reflux esophagitis recent EGD. Stop pantoprazole. Trial of Dexilant once daily before breakfast. Ov in 3 months.       Constipation   Last Assessment & Plan 01/19/2014 Office Visit Written 01/19/2014 12:39 PM by [name], PA-C    Increase Amitiza to 32mg BID with food. CT showed moderate constipation. Better adequate control of her constipation may improve her vague abdominal pain.      Dysphagia   Last Assessment & Plan 09/22/2011 Office Visit Written 09/22/2011  9:09 AM by KAndria Meuse NP    Symptoms are more pharyngeal. No symptoms of esophageal dysphagia at this time.  Trial of PPI, however he she does not have 100% response, she should be seen by ENT.        Nervous and Auditory   SCIATICA     Musculoskeletal and Integument   DERANGEMENT MENISCUS   H N P-LUMBAR   Patellofemoral pain syndrome   Chondromalacia of left patella     Other   Atypical chest pain   Last Assessment & Plan 06/17/2013 Office Visit Written 06/17/2013  1:50 PM by KLendon Colonel NP    Chest X-ray demonstrated infective  process. She is finishing up her Z-Pack.       Palpitations   Last Assessment & Plan 06/17/2013 Office Visit Written 06/17/2013  1:48 PM by KLendon Colonel NP    Continues to have occasional palpitations which she states cause her to feel lightheaded. These are transient and short-lived. No changes in her medications.       ANEMIA   PSYCHIATRIC DISORDER   SEIZURE DISORDER   INSOMNIA   NAUSEA   Last Assessment & Plan 10/17/2010 Office Visit Written 10/17/2010  4:30 PM by KAndria Meuse NP    Chronic nausea without vomiting.  GES normal previously.  ? Medication side effect.  Check fasting cortisol.        Abdominal pain   Last Assessment & Plan 01/19/2014 Office Visit Edited 01/19/2014 12:43 PM by LMahala Menghini PA-C    Abdominal pain in multiple locations. Recent CT, MRI reassuring. Likely contributing factor is unmanaged constipation, GERD, +/- musculoskeletal, IBS. Increase Amitiza as outlined. Change PPI to Dexilant. Labs and U/A. OV in 3 months with Dr. RGala Romney       *** LATEX ALLERGY ***   Vaginal discharge   Last Assessment & Plan 05/18/2010 Office Visit Written 05/19/2010  2:21 PM by AOrvil Feil NP    Lower abdominal pain during and after sexual intercourse; also complains of increased amount of yellowish discharge. Strongly counseled to seek GYN care. Pt has appt May 31st. Asked to schedule sooner.       Knee pain   Muscle weakness (generalized)   Hematochezia   Last Assessment & Plan 02/21/2011 Office Visit Written 02/22/2011  4:51 PM by Orvil Feil, NP    44 year old female with history of chronic abdominal pain, IBS-C now reporting incidences of bright red blood/burgundy stools. No prior TCS performed, does have hx of flex sig in remote past. Likely due to benign anorectal source. Has distant family hx of colon cancer, no first degree relatives.   Proceed with TCS with Dr. Gala Romney in near future: the risks, benefits, and alternatives have been discussed with the patient in  detail. The patient states understanding and desires to proceed. Phenergan 25 mg IV 30 min prior to procedure to help facilitate sedation      Obesity   Last Assessment & Plan 11/22/2011 Office Visit Edited 11/22/2011 11:19 AM by Andria Meuse, NP    Recommend 1-2# weight loss per week until ideal body weight through exercise & diet. Low fat/cholesterol diet. Gradually increase exercise from 15 min daily up to 1 hr per day 5 days/week.  Try WATER AEROBICS. Limit alcohol use.  Office visit in 6 months      Upper abdominal pain   Last Assessment & Plan 10/29/2013 Office Visit Written 10/29/2013  9:19 AM by [name], PA-C    44 year old lady with chronic GERD and constipation who presents with complaints of postprandial upper abdominal discomfort, abnormal weight loss, early satiety over the past couple of months. Weight is down nearly 30 pounds. She was diagnosed with diabetes about 6 months ago and started on metformin, low dose at that time. It is unclear, how much this has to do with her weight loss but less likely a factor in her abdominal pain. I have requested most recent labs from PCP for review. Make sure CBC and LFTs have been done. She'll continue pantoprazole 40 mg daily for now. She is no longer on NSAIDs or aspirin. Plan on upper endoscopy.  I have discussed the risks, alternatives, benefits with regards to but not limited to the risk of reaction to medication, bleeding, infection, perforation and the patient is agreeable to proceed. Written consent to be obtained. If negative we will plan on CT abdomen pelvis, premedicate for history of IVP allergy.      Abnormal weight loss   Dysuria   Last Assessment & Plan 01/19/2014 Office Visit Written 01/19/2014 12:43 PM by [name], PA-C    Check U/A with culture reflex.          Imaging: No results found.

## 2014-04-06 NOTE — Progress Notes (Signed)
Cardiology Office Note   Date:  04/06/2014   ID:  Jennifer [name], DOB [birth year], MRN [id number]  PCP:  Deloria Lair, MD  Cardiologist: Woodroe Chen, NP   Chief Complaint  Patient presents with  . Chest Pain  . Palpitations      History of Present Illness: Jennifer [name] is a 44 y.o. female who presents for ongoing assessment and management of chest pain, fatigue, with history of palpitations.  The patient has Jennifer [name] stress test, which was normal with chest pain, noncardiac in etiology.  Other history includes asthma.  The patient was last seen in the office in May 2015 and was stable from a cardiac standpoint.  No changes in her medication regimen were made.  She comes today complaining of fatigue, dizziness when she gets out of bed, and DOE. She has had a sleep study in March of 2015 which was negative for OSA. She is very sedentary sleeping all day and not getting up and moving very much., She is obese but states that she eats only one meal a day.   Past Medical History  Diagnosis Date  . Seizures   . PTSD (post-traumatic stress disorder)   . GERD (gastroesophageal reflux disease)   . Panic attack   . Asthma   . Anxiety   . Schizophrenia   . Bipolar 1 disorder   . S/P endoscopy Feb 2012    Nl, s/p [facility] dilator, biopsy benign  . S/P endoscopy 2008    Mild gastritis  . History of flexible sigmoidoscopy 1999    Normal to 60cm,  . Diabetes mellitus without complication   . Ovarian cancer     Past Surgical History  Procedure Laterality Date  . Partial hysterectomy    . Cholecystectomy    . Tonsillectomy    . Hernia repair      Incisional with mesh  . Colonoscopy  03/01/2011    [place]-External hemorrhoids/otherwise normal rectum and colon.  . Esophagogastroduodenoscopy  2008    gastritis  . Esophagogastroduodenoscopy  2012    Moderate sized hiatal hernia, non-H. pylori gastritis  . Esophagogastroduodenoscopy N/A 11/17/2013    RMR: Mild  erosive reflux esophagitis. Patulous EG junction     Current Outpatient Prescriptions  Medication Sig Dispense Refill  . albuterol (PROVENTIL) (2.5 MG/3ML) 0.083% nebulizer solution Take 2.5 mg by nebulization every 6 (six) hours as needed for wheezing.    . cetirizine (ZYRTEC) 10 MG tablet Take 10 mg by mouth every morning.    . citalopram (CELEXA) 40 MG tablet Take 40 mg by mouth daily.    Marland Kitchen dexlansoprazole (DEXILANT) 60 MG capsule Take 1 capsule (60 mg total) by mouth daily. 60 capsule 5  . estradiol (ESTRACE) 0.1 MG/GM vaginal cream Place 2 g vaginally daily.     . fluticasone (FLONASE) 50 MCG/ACT nasal spray Place 1 spray into the nose 2 (two) times daily.     Marland Kitchen levETIRAcetam (KEPPRA) 750 MG tablet Take 2,250 mg by mouth at bedtime.     Marland Kitchen lubiprostone (AMITIZA) 24 MCG capsule Take 1 capsule (24 mcg total) by mouth 2 (two) times daily with a meal. 60 capsule 3  . ondansetron (ZOFRAN) 4 MG tablet Take 4 mg by mouth every 8 (eight) hours as needed for nausea or vomiting.    Marland Kitchen oxybutynin (DITROPAN) 5 MG tablet Take 5 mg by mouth 2 (two) times daily.    . traZODone (DESYREL) 50 MG tablet Take 50 mg by mouth at bedtime.    Marland Kitchen  Vitamin D, Ergocalciferol, (DRISDOL) 50000 UNITS CAPS capsule Take 50,000 Units by mouth every 7 (seven) days.    . paliperidone (INVEGA) 3 MG 24 hr tablet Take 6 mg by mouth at bedtime.      No current facility-administered medications for this visit.    Allergies:   Shellfish allergy; Codeine; Hydrocodone; Ivp dye; and Latex    Social History:  The patient  reports that she has been smoking Cigarettes.  She has a 6.5 pack-year smoking history. She does not have any smokeless tobacco history on file. She reports that she drinks alcohol. She reports that she does not use illicit drugs.   Family History:  The patient'sfamily history includes Breast cancer in her maternal grandmother; Breast cancer (age of onset: 79) in her daughter; Colon cancer in her maternal  grandfather; Crohn's disease in her cousin and maternal aunt; Pancreatic cancer in her maternal grandfather.    ROS: .   All other systems are reviewed and negative.Unless otherwise mentioned in H&P above.   PHYSICAL EXAM: VS:  BP 102/88 mmHg  Pulse 113  SpO2 98% , BMI There is no weight on file to calculate BMI. GEN: Well nourished, well developed, in no acute distress HEENT: normal Neck: no JVD, carotid bruits, or masses Cardiac:RRR; no murmurs, rubs, or gallops,no edema  Respiratory:  Poor inspiratory effort, no wheezes presently GI: soft, nontender, nondistended, + BS Obese  MS: no deformity or atrophy Skin: warm and dry, no rash Neuro:  Strength and sensation are intact Psych: euthymic mood, full affect   EKG:  The ekg ordered today demonstrates Sinus tachycardia rate of 102 bpm.   Recent Labs: 01/23/2014: ALT 12; BUN 12; Creatinine 1.13*; Hemoglobin 12.2; Platelets 299; Potassium 3.7; Sodium 139; TSH 2.478    Lipid Panel No results found for: CHOL, TRIG, HDL, CHOLHDL, VLDL, LDLCALC, LDLDIRECT    Wt Readings from Last 3 Encounters:  01/19/14 104.872 kg (231 lb 3.2 oz)  12/08/13 106.595 kg (235 lb)  11/17/13 106.142 kg (234 lb)      Other studies Reviewed: Additional studies/ records that were reviewed today include: Sleep study.    ASSESSMENT AND PLAN:  1.Dyspnea on exertion: Multifactorial, sedentary lifestyle, obesity, history of asthma.  She complains of feeling lightheaded and dizzy when she stands up.  She is on a good bit of pain medicine for back and neck problems. I will order PFT's top evaluate further her lung capacity and breathing status. She has inhalers, but does not use them. Sleep study was negative for obstructive sleep apnea, but she also slept on her stomach.  That she cannot sleep on her back because she cannot breathe.  I have asked her to walk 10 minutes a day, every day for 2 weeks, at the end of 2 weeks she is to walk 15 minutes a day.  I  will see her back in a month to evaluate her status.  I have advised her to please be consistent with this even if she does not feel like walking.  We have discussed her goals and she wants to have more energy and lose weight..  I have talked to her about becoming more active as this will be helpful to to reach those goals.  She is advised to avoid high calorie foods.she is also advised to quit smoking and she does smoke from her husband cigarettes.  2. Dizziness:She takes pain medication for back and neck pain, which I think is contributing to her dizziness as she gets  up quickly.  She is also on psychiatric medications, which can also precipitate this.  I have asked her to take her time getting out of bed.She states her heart is racing when she does so.due to some mild hypotension, probably from pain medication, I will not place her on a beta blocker at this time.   Current medicines are reviewed at length with the patient today.  The patient does not have concerns regarding medicines.  Labs/ tests ordered today include: PFTs No orders of the defined types were placed in this encounter.     Disposition:   FU with 1 month  Signed, Jory Sims, NP  04/06/2014 3:20 PM    [facility] [address], [place], [place] [address] Phone: [phone]; Fax: [phone]

## 2014-04-06 NOTE — Patient Instructions (Signed)
Your physician recommends that you schedule a follow-up appointment in: 1 month with Jory Sims, NP  Your physician recommends that you continue on your current medications as directed. Please refer to the Current Medication list given to you today.  Your physician has recommended that you have a pulmonary function test. Pulmonary Function Tests are a group of tests that measure how well air moves in and out of your lungs. Your physician discussed the importance of regular exercise and recommended that you start or continue a regular exercise program for good health. You can start walking 10 min. A day for 2 weeks and then increase it to 15 min. A day.   Thank you for choosing [facility]!

## 2014-04-08 ENCOUNTER — Ambulatory Visit (HOSPITAL_COMMUNITY): Payer: Medicaid Other

## 2014-04-08 ENCOUNTER — Encounter: Payer: Self-pay | Admitting: Internal Medicine

## 2014-04-15 ENCOUNTER — Ambulatory Visit (HOSPITAL_COMMUNITY): Admission: RE | Admit: 2014-04-15 | Payer: Medicaid Other | Source: Ambulatory Visit

## 2014-04-15 ENCOUNTER — Ambulatory Visit (HOSPITAL_COMMUNITY)
Admission: RE | Admit: 2014-04-15 | Discharge: 2014-04-15 | Disposition: A | Discharge status: Home / Self Care | Payer: Medicaid Other | Source: Ambulatory Visit | Attending: Adult Health | Admitting: Adult Health

## 2014-04-15 DIAGNOSIS — R0602 Shortness of breath: Secondary | ICD-10-CM

## 2014-04-15 LAB — PULMONARY FUNCTION TEST
DL/VA % pred: 85 %
DL/VA: 4.29 ml/min/mmHg/L
DLCO UNC % PRED: 60 %
DLCO UNC: 16.3 ml/min/mmHg
DLCO cor % pred: 60 %
DLCO cor: 16.3 ml/min/mmHg
FEF 25-75 PRE: 3.28 L/s
FEF 25-75 Post: 3.63 L/sec
FEF2575-%Change-Post: 10 %
FEF2575-%PRED-POST: 127 %
FEF2575-%Pred-Pre: 115 %
FEV1-%Change-Post: 4 %
FEV1-%PRED-POST: 93 %
FEV1-%PRED-PRE: 89 %
FEV1-PRE: 2.37 L
FEV1-Post: 2.47 L
FEV1FVC-%Change-Post: 0 %
FEV1FVC-%PRED-PRE: 116 %
FEV6-%Change-Post: 5 %
FEV6-%Pred-Post: 81 %
FEV6-%Pred-Pre: 77 %
FEV6-POST: 2.58 L
FEV6-Pre: 2.46 L
FEV6FVC-%Pred-Post: 102 %
FEV6FVC-%Pred-Pre: 102 %
FVC-%Change-Post: 5 %
FVC-%Pred-Post: 79 %
FVC-%Pred-Pre: 75 %
FVC-PRE: 2.46 L
FVC-Post: 2.58 L
Post FEV1/FVC ratio: 96 %
Post FEV6/FVC ratio: 100 %
Pre FEV1/FVC ratio: 96 %
Pre FEV6/FVC Ratio: 100 %
RV % pred: 149 %
RV: 2.62 L
TLC % pred: 82 %
TLC: 4.39 L

## 2014-04-15 MED ORDER — ALBUTEROL SULFATE (2.5 MG/3ML) 0.083% IN NEBU
2.5000 mg | INHALATION_SOLUTION | Freq: Once | RESPIRATORY_TRACT | Status: AC
  Administered 2014-04-15: 2.5 mg via RESPIRATORY_TRACT

## 2014-04-16 ENCOUNTER — Telehealth: Payer: Self-pay

## 2014-04-16 DIAGNOSIS — R0609 Other forms of dyspnea: Secondary | ICD-10-CM

## 2014-04-16 DIAGNOSIS — R0600 Dyspnea, unspecified: Secondary | ICD-10-CM

## 2014-04-16 NOTE — Telephone Encounter (Signed)
Placed ref to Dr Luan Pulling, spoke with husband.

## 2014-04-16 NOTE — Telephone Encounter (Signed)
-----   Message from Lendon Colonel, NP sent at 04/16/2014  7:00 AM EST ----- Please refer to Dr. Luan Pulling for further treatment of breathing status based upon PFT results showing restrictive pattern.

## 2014-05-04 ENCOUNTER — Encounter: Payer: Self-pay | Admitting: Adult Health

## 2014-05-04 ENCOUNTER — Ambulatory Visit (INDEPENDENT_AMBULATORY_CARE_PROVIDER_SITE_OTHER): Payer: Medicaid Other | Admitting: Adult Health

## 2014-05-04 VITALS — BP 84/60 | HR 80 | Ht 66.0 in | Wt 231.0 lb

## 2014-05-04 DIAGNOSIS — I950 Idiopathic hypotension: Secondary | ICD-10-CM | POA: Diagnosis not present

## 2014-05-04 DIAGNOSIS — R0609 Other forms of dyspnea: Secondary | ICD-10-CM | POA: Diagnosis not present

## 2014-05-04 DIAGNOSIS — R0600 Dyspnea, unspecified: Secondary | ICD-10-CM

## 2014-05-04 NOTE — Patient Instructions (Signed)
Your physician wants you to follow-up in: 1 year with K.lawrence NP You will receive a reminder letter in the mail two months in advance. If you don't receive a letter, please call our office to schedule the follow-up appointment.    Your physician recommends that you continue on your current medications as directed. Please refer to the Current Medication list given to you today.     Thank you for choosing [facility] !

## 2014-05-04 NOTE — Progress Notes (Deleted)
Name: Jennifer [name]    DOB: Jul 16, 1970  Age: 44 y.o.  MR#: [id number]       PCP:  Deloria Lair, MD      Insurance: Payor: MEDICAID [place] / Plan: MEDICAID [place] ACCESS / Product Type: *No Product type* /   CC:    Chief Complaint  Patient presents with  . Chest Pain  . Shortness of Breath    VS Filed Vitals:   05/04/14 1329  BP: 84/60  Pulse: 80  Height: _0  (1.676 m)  Weight: 231 lb (104.781 kg)  SpO2: 97%    Weights Current Weight  05/04/14 231 lb (104.781 kg)  01/19/14 231 lb 3.2 oz (104.872 kg)  12/08/13 235 lb (106.595 kg)    Blood Pressure  BP Readings from Last 3 Encounters:  05/04/14 84/60  04/06/14 102/88  01/19/14 116/82     Admit date:  (Not on file) Last encounter with RMR:  04/06/2014   Allergy Shellfish allergy; Codeine; Hydrocodone; Ivp dye; and Latex  Current Outpatient Prescriptions  Medication Sig Dispense Refill  . albuterol (PROVENTIL) (2.5 MG/3ML) 0.083% nebulizer solution Take 2.5 mg by nebulization every 6 (six) hours as needed for wheezing.    . cetirizine (ZYRTEC) 10 MG tablet Take 10 mg by mouth every morning.    . citalopram (CELEXA) 40 MG tablet Take 40 mg by mouth daily.    Marland Kitchen dexlansoprazole (DEXILANT) 60 MG capsule Take 1 capsule (60 mg total) by mouth daily. 60 capsule 5  . estradiol (ESTRACE) 0.1 MG/GM vaginal cream Place 2 g vaginally daily.     . fluticasone (FLONASE) 50 MCG/ACT nasal spray Place 1 spray into the nose 2 (two) times daily.     Marland Kitchen levETIRAcetam (KEPPRA) 750 MG tablet Take 2,250 mg by mouth at bedtime.     Marland Kitchen lubiprostone (AMITIZA) 24 MCG capsule Take 1 capsule (24 mcg total) by mouth 2 (two) times daily with a meal. 60 capsule 3  . ondansetron (ZOFRAN) 4 MG tablet Take 4 mg by mouth every 8 (eight) hours as needed for nausea or vomiting.    Marland Kitchen oxybutynin (DITROPAN) 5 MG tablet Take 5 mg by mouth 2 (two) times daily.    . paliperidone (INVEGA) 3 MG 24 hr tablet Take 6 mg by mouth at bedtime.     . traZODone (DESYREL) 50  MG tablet Take 50 mg by mouth at bedtime.    . Vitamin D, Ergocalciferol, (DRISDOL) 50000 UNITS CAPS capsule Take 50,000 Units by mouth every 7 (seven) days.     No current facility-administered medications for this visit.    Discontinued Meds:   There are no discontinued medications.  Patient Active Problem List   Diagnosis Date Noted  . Dysuria 01/19/2014  . Upper abdominal pain 10/29/2013  . Abnormal weight loss 10/29/2013  . Hiatal hernia 08/20/2012  . Obesity 11/22/2011  . Atypical chest pain 09/21/2011  . HTN (hypertension) 09/21/2011  . Palpitations 09/21/2011  . Chondromalacia of left patella 05/23/2011  . Patellofemoral pain syndrome 04/24/2011  . Hematochezia 02/22/2011  . Knee pain 12/28/2010  . Muscle weakness (generalized) 12/28/2010  . Dysphagia 05/19/2010  . Vaginal discharge 05/19/2010  . *** LATEX ALLERGY *** 03/08/2010  . PSYCHIATRIC DISORDER 03/10/2009  . ANEMIA 03/09/2009  . GASTROESOPHAGEAL REFLUX DISEASE, CHRONIC 03/09/2009  . Constipation 03/09/2009  . SEIZURE DISORDER 03/09/2009  . INSOMNIA 03/09/2009  . NAUSEA 03/09/2009  . Abdominal pain 03/09/2009  . DERANGEMENT MENISCUS 11/11/2008  . H N P-LUMBAR 11/11/2008  .  SCIATICA 11/11/2008    LABS    Component Value Date/Time   NA 139 01/23/2014 0814   NA 140 02/08/2013 1340   NA 136 12/03/2012 1244   K 3.7 01/23/2014 0814   K 3.8 02/08/2013 1340   K 4.0 12/03/2012 1244   K 4.1 02/23/2012   CL 107 01/23/2014 0814   CL 105 02/08/2013 1340   CL 101 12/03/2012 1244   CO2 21 01/23/2014 0814   CO2 23 02/08/2013 1340   CO2 25 12/03/2012 1244   GLUCOSE 145* 01/23/2014 0814   GLUCOSE 109* 02/08/2013 1340   GLUCOSE 95 12/03/2012 1244   BUN 12 01/23/2014 0814   BUN 19 02/08/2013 1340   BUN 11 12/03/2012 1244   BUN 19 02/23/2012   CREATININE 1.13* 01/23/2014 0814   CREATININE 1.30* 11/20/2013 0848   CREATININE 1.20* 02/08/2013 1340   CREATININE 1.10 12/03/2012 1244   CREATININE 1.25 02/23/2012    CREATININE 1.17 05/18/2010 1609   CALCIUM 9.3 01/23/2014 0814   CALCIUM 9.3 02/08/2013 1340   CALCIUM 9.4 12/03/2012 1244   CALCIUM 8.9 02/23/2012   GFRNONAA 55* 02/08/2013 1340   GFRNONAA 61* 12/03/2012 1244   GFRNONAA 60* 02/23/2010 1444   GFRAA 64* 02/08/2013 1340   GFRAA 71* 12/03/2012 1244   GFRAA  02/23/2010 1444    >60        The eGFR has been calculated using the MDRD equation. This calculation has not been validated in all clinical situations. eGFR's persistently <60 mL/min signify possible Chronic Kidney Disease.   CMP     Component Value Date/Time   NA 139 01/23/2014 0814   K 3.7 01/23/2014 0814   K 4.1 02/23/2012   CL 107 01/23/2014 0814   CO2 21 01/23/2014 0814   GLUCOSE 145* 01/23/2014 0814   BUN 12 01/23/2014 0814   BUN 19 02/23/2012   CREATININE 1.13* 01/23/2014 0814   CREATININE 1.30* 11/20/2013 0848   CALCIUM 9.3 01/23/2014 0814   CALCIUM 8.9 02/23/2012   PROT 6.4 01/23/2014 0814   ALBUMIN 3.5 01/23/2014 0814   AST 15 01/23/2014 0814   AST 13 02/23/2012   ALT 12 01/23/2014 0814   ALKPHOS 76 01/23/2014 0814   ALKPHOS 71 02/23/2012   BILITOT 0.4 01/23/2014 0814   BILITOT 0.1 02/23/2012   GFRNONAA 55* 02/08/2013 1340   GFRAA 64* 02/08/2013 1340       Component Value Date/Time   WBC 7.3 01/23/2014 0814   WBC 9.6 06/09/2013 1636   WBC 6.3 02/08/2013 1319   HGB 12.2 01/23/2014 0814   HGB 12.5 06/09/2013 1636   HGB 11.7* 02/08/2013 1319   HCT 38.9 01/23/2014 0814   HCT 38.2 06/09/2013 1636   HCT 36.5 02/08/2013 1319   MCV 76.4* 01/23/2014 0814   MCV 72.6* 06/09/2013 1636   MCV 72.6* 02/08/2013 1319    Lipid Panel  No results found for: CHOL, TRIG, HDL, CHOLHDL, VLDL, LDLCALC, LDLDIRECT  ABG No results found for: PHART, PCO2ART, PO2ART, HCO3, TCO2, ACIDBASEDEF, O2SAT   Lab Results  Component Value Date   TSH 2.478 01/23/2014   BNP (last 3 results) No results for input(s): BNP in the last 8760 hours.  ProBNP (last 3 results) No  results for input(s): PROBNP in the last 8760 hours.  Cardiac Panel (last 3 results) No results for input(s): CKTOTAL, CKMB, TROPONINI, RELINDX in the last 72 hours.  Iron/TIBC/Ferritin/ %Sat No results found for: IRON, TIBC, FERRITIN, IRONPCTSAT   EKG Orders placed or performed in  visit on 04/06/14  . EKG 12-Lead     Prior Assessment and Plan Problem List as of 05/04/2014      Cardiovascular and Mediastinum   HTN (hypertension)   Last Assessment & Plan 06/17/2013 Office Visit Written 06/17/2013  1:51 PM by Lendon Colonel, NP    BP is well controlled. No changes. She will be seen in 6 months.        Respiratory   Hiatal hernia     Digestive   GASTROESOPHAGEAL REFLUX DISEASE, CHRONIC   Last Assessment & Plan 01/19/2014 Office Visit Written 01/19/2014 12:41 PM by [name], PA-C    Continued complaints of reflux even on pantoprazole BID. Suspect some relationship with dietary indiscretion. Mild erosive reflux esophagitis recent EGD. Stop pantoprazole. Trial of Dexilant once daily before breakfast. Ov in 3 months.       Constipation   Last Assessment & Plan 01/19/2014 Office Visit Written 01/19/2014 12:39 PM by [name], PA-C    Increase Amitiza to 64mg BID with food. CT showed moderate constipation. Better adequate control of her constipation may improve her vague abdominal pain.      Dysphagia   Last Assessment & Plan 09/22/2011 Office Visit Written 09/22/2011  9:09 AM by KAndria Meuse NP    Symptoms are more pharyngeal. No symptoms of esophageal dysphagia at this time.  Trial of PPI, however he she does not have 100% response, she should be seen by ENT.        Nervous and Auditory   SCIATICA     Musculoskeletal and Integument   DERANGEMENT MENISCUS   H N P-LUMBAR   Patellofemoral pain syndrome   Chondromalacia of left patella     Other   Atypical chest pain   Last Assessment & Plan 06/17/2013 Office Visit Written 06/17/2013  1:50 PM by KLendon Colonel NP    Chest X-ray demonstrated infective process. She is finishing up her Z-Pack.       Palpitations   Last Assessment & Plan 06/17/2013 Office Visit Written 06/17/2013  1:48 PM by KLendon Colonel NP    Continues to have occasional palpitations which she states cause her to feel lightheaded. These are transient and short-lived. No changes in her medications.       ANEMIA   PSYCHIATRIC DISORDER   SEIZURE DISORDER   INSOMNIA   NAUSEA   Last Assessment & Plan 10/17/2010 Office Visit Written 10/17/2010  4:30 PM by KAndria Meuse NP    Chronic nausea without vomiting.  GES normal previously.  ? Medication side effect.  Check fasting cortisol.        Abdominal pain   Last Assessment & Plan 01/19/2014 Office Visit Edited 01/19/2014 12:43 PM by LMahala Menghini PA-C    Abdominal pain in multiple locations. Recent CT, MRI reassuring. Likely contributing factor is unmanaged constipation, GERD, +/- musculoskeletal, IBS. Increase Amitiza as outlined. Change PPI to Dexilant. Labs and U/A. OV in 3 months with Dr. RGala Romney       *** LATEX ALLERGY ***   Vaginal discharge   Last Assessment & Plan 05/18/2010 Office Visit Written 05/19/2010  2:21 PM by AOrvil Feil NP    Lower abdominal pain during and after sexual intercourse; also complains of increased amount of yellowish discharge. Strongly counseled to seek GYN care. Pt has appt May 31st. Asked to schedule sooner.       Knee pain   Muscle weakness (generalized)   Hematochezia  Last Assessment & Plan 02/21/2011 Office Visit Written 02/22/2011  4:51 PM by Orvil Feil, NP    44 year old female with history of chronic abdominal pain, IBS-C now reporting incidences of bright red blood/burgundy stools. No prior TCS performed, does have hx of flex sig in remote past. Likely due to benign anorectal source. Has distant family hx of colon cancer, no first degree relatives.   Proceed with TCS with Dr. Gala Romney in near future: the risks, benefits, and  alternatives have been discussed with the patient in detail. The patient states understanding and desires to proceed. Phenergan 25 mg IV 30 min prior to procedure to help facilitate sedation      Obesity   Last Assessment & Plan 11/22/2011 Office Visit Edited 11/22/2011 11:19 AM by Andria Meuse, NP    Recommend 1-2# weight loss per week until ideal body weight through exercise & diet. Low fat/cholesterol diet. Gradually increase exercise from 15 min daily up to 1 hr per day 5 days/week.  Try WATER AEROBICS. Limit alcohol use.  Office visit in 6 months      Upper abdominal pain   Last Assessment & Plan 10/29/2013 Office Visit Written 10/29/2013  9:19 AM by [name], PA-C    44 year old lady with chronic GERD and constipation who presents with complaints of postprandial upper abdominal discomfort, abnormal weight loss, early satiety over the past couple of months. Weight is down nearly 30 pounds. She was diagnosed with diabetes about 6 months ago and started on metformin, low dose at that time. It is unclear, how much this has to do with her weight loss but less likely a factor in her abdominal pain. I have requested most recent labs from PCP for review. Make sure CBC and LFTs have been done. She'll continue pantoprazole 40 mg daily for now. She is no longer on NSAIDs or aspirin. Plan on upper endoscopy.  I have discussed the risks, alternatives, benefits with regards to but not limited to the risk of reaction to medication, bleeding, infection, perforation and the patient is agreeable to proceed. Written consent to be obtained. If negative we will plan on CT abdomen pelvis, premedicate for history of IVP allergy.      Abnormal weight loss   Dysuria   Last Assessment & Plan 01/19/2014 Office Visit Written 01/19/2014 12:43 PM by [name], PA-C    Check U/A with culture reflex.          Imaging: No results found.

## 2014-05-04 NOTE — Progress Notes (Signed)
Cardiology Office Note   Date:  05/04/2014   ID:  Jennifer [name], DOB Mar 07, 1970, MRN [id number]  PCP:  Deloria Lair, MD  Cardiologist:  Woodroe Chen, NP   Chief Complaint  Patient presents with  . Chest Pain  . Shortness of Breath      History of Present Illness: Jennifer [name] is a 44 y.o. female who presents for ongoing assessment and management of chest pain with palpitations.  The patient has a history of Cardiolite stress test, which was negative for ischemia.  She was last seen in the office on 04/06/2014, with ongoing complaints of shortness of breath and dyspnea on exertion.  Patient had PFTs completed, and she was referred to Dr. Luan Pulling, pulmonologist, as her results revealed restrictive pattern.    She is non-compliant with medications concerning inhalers. She is not exercising as we discussed on last office visit. She is eating a lot of sweets, although she is diabetic. She is due to see psychiatry tomorrow. She states that she has been dizzy and lightheaded. Does not feel well. She denies chest pain or near syncope, no palpitations.     Past Medical History  Diagnosis Date  . Seizures   . PTSD (post-traumatic stress disorder)   . GERD (gastroesophageal reflux disease)   . Panic attack   . Asthma   . Anxiety   . Schizophrenia   . Bipolar 1 disorder   . S/P endoscopy Feb 2012    Nl, s/p 56-French [facility] dilator, biopsy benign  . S/P endoscopy 2008    Mild gastritis  . History of flexible sigmoidoscopy 1999    Normal to 60cm,  . Diabetes mellitus without complication   . Ovarian cancer     Past Surgical History  Procedure Laterality Date  . Partial hysterectomy    . Cholecystectomy    . Tonsillectomy    . Hernia repair      Incisional with mesh  . Colonoscopy  03/01/2011    Rourk-External hemorrhoids/otherwise normal rectum and colon.  . Esophagogastroduodenoscopy  2008    gastritis  . Esophagogastroduodenoscopy  2012    Moderate sized  hiatal hernia, non-H. pylori gastritis  . Esophagogastroduodenoscopy N/A 11/17/2013    RMR: Mild erosive reflux esophagitis. Patulous EG junction     Current Outpatient Prescriptions  Medication Sig Dispense Refill  . albuterol (PROVENTIL) (2.5 MG/3ML) 0.083% nebulizer solution Take 2.5 mg by nebulization every 6 (six) hours as needed for wheezing.    . cetirizine (ZYRTEC) 10 MG tablet Take 10 mg by mouth every morning.    . citalopram (CELEXA) 40 MG tablet Take 40 mg by mouth daily.    Marland Kitchen dexlansoprazole (DEXILANT) 60 MG capsule Take 1 capsule (60 mg total) by mouth daily. 60 capsule 5  . estradiol (ESTRACE) 0.1 MG/GM vaginal cream Place 2 g vaginally daily.     . fluticasone (FLONASE) 50 MCG/ACT nasal spray Place 1 spray into the nose 2 (two) times daily.     Marland Kitchen levETIRAcetam (KEPPRA) 750 MG tablet Take 2,250 mg by mouth at bedtime.     Marland Kitchen lubiprostone (AMITIZA) 24 MCG capsule Take 1 capsule (24 mcg total) by mouth 2 (two) times daily with a meal. 60 capsule 3  . ondansetron (ZOFRAN) 4 MG tablet Take 4 mg by mouth every 8 (eight) hours as needed for nausea or vomiting.    Marland Kitchen oxybutynin (DITROPAN) 5 MG tablet Take 5 mg by mouth 2 (two) times daily.    . paliperidone (INVEGA)  3 MG 24 hr tablet Take 6 mg by mouth at bedtime.     . traZODone (DESYREL) 50 MG tablet Take 50 mg by mouth at bedtime.    . Vitamin D, Ergocalciferol, (DRISDOL) 50000 UNITS CAPS capsule Take 50,000 Units by mouth every 7 (seven) days.     No current facility-administered medications for this visit.    Allergies:   Shellfish allergy; Codeine; Hydrocodone; Ivp dye; and Latex    Social History:  The patient  reports that she has been smoking Cigarettes.  She has a 6.5 pack-year smoking history. She does not have any smokeless tobacco history on file. She reports that she drinks alcohol. She reports that she does not use illicit drugs.   Family History:  The patient's family history includes Breast cancer in her maternal  grandmother; Breast cancer (age of onset: 15) in her daughter; Colon cancer in her maternal grandfather; Crohn's disease in her cousin and maternal aunt; Pancreatic cancer in her maternal grandfather.    ROS: .   All other systems are reviewed and negative.Unless otherwise mentioned in H&P above.   PHYSICAL EXAM: VS:  There were no vitals taken for this visit. , BMI There is no weight on file to calculate BMI. GEN: Well nourished, well developed, in no acute distress HEENT: normal Neck: no JVD, carotid bruits, or masses Cardiac: RRR; no murmurs, rubs, or gallops,no edema  Respiratory:  clear to auscultation bilaterally, normal work of breathing GI: soft, nontender, nondistended, + BS MS: no deformity or atrophy Skin: warm and dry, no rash Neuro:  Strength and sensation are intact Psych: Flat affect    Recent Labs: 01/23/2014: ALT 12; BUN 12; Creatinine 1.13*; Hemoglobin 12.2; Platelets 299; Potassium 3.7; Sodium 139; TSH 2.478    Lipid Panel No results found for: CHOL, TRIG, HDL, CHOLHDL, VLDL, LDLCALC, LDLDIRECT    Wt Readings from Last 3 Encounters:  01/19/14 231 lb 3.2 oz (104.872 kg)  12/08/13 235 lb (106.595 kg)  11/17/13 234 lb (106.142 kg)      ASSESSMENT AND PLAN:  1. Hypotension: I have rechecked her BP in the office. BP 107/68. She is on multiple psychiatric medications that can drop her BP. I will not adjust them an swill defer to psychiatry for medication changes. She is due to see them tomorrow. I have instructed her to drink plenty of fluids and to avoid caffeine and sweets that can be dehydrating. \  2. Diabetes: She is very non-compliant with her dietary regimen.   3. Dyspnea; She is not using her inhaler as directed. She is to see Dr. Luan Pulling for pulmonary recommendations in the setting of restrictive breathing pattern per PfT's.   Current medicines are reviewed at length with the patient today.     No orders of the defined types were placed in this  encounter.     Disposition:   FU with 1 year. Signed, Jory Sims, NP  05/04/2014 7:06 AM    [facility] [address] [place], [place] [address] Phone: [phone]; Fax: [phone]

## 2014-06-02 ENCOUNTER — Other Ambulatory Visit: Payer: Self-pay | Admitting: Gastroenterology

## 2014-11-09 ENCOUNTER — Other Ambulatory Visit: Payer: Self-pay | Admitting: Gastroenterology

## 2014-11-20 ENCOUNTER — Encounter: Payer: Self-pay | Admitting: Internal Medicine

## 2014-12-04 ENCOUNTER — Other Ambulatory Visit: Payer: Self-pay | Admitting: Internal Medicine

## 2014-12-07 ENCOUNTER — Encounter: Payer: Self-pay | Admitting: Gastroenterology

## 2014-12-07 ENCOUNTER — Ambulatory Visit (INDEPENDENT_AMBULATORY_CARE_PROVIDER_SITE_OTHER): Payer: 59 | Admitting: Gastroenterology

## 2014-12-07 VITALS — BP 111/79 | HR 86 | Temp 97.1°F | Ht 66.0 in | Wt 231.8 lb

## 2014-12-07 DIAGNOSIS — K5900 Constipation, unspecified: Secondary | ICD-10-CM

## 2014-12-07 DIAGNOSIS — K2100 Gastro-esophageal reflux disease with esophagitis, without bleeding: Secondary | ICD-10-CM

## 2014-12-07 DIAGNOSIS — R110 Nausea: Secondary | ICD-10-CM

## 2014-12-07 DIAGNOSIS — K921 Melena: Secondary | ICD-10-CM | POA: Diagnosis not present

## 2014-12-07 DIAGNOSIS — R1030 Lower abdominal pain, unspecified: Secondary | ICD-10-CM | POA: Diagnosis not present

## 2014-12-07 DIAGNOSIS — R1010 Upper abdominal pain, unspecified: Secondary | ICD-10-CM

## 2014-12-07 LAB — CBC WITH DIFFERENTIAL/PLATELET
Basophils Absolute: 0 10*3/uL (ref 0.0–0.1)
Basophils Relative: 0 % (ref 0–1)
EOS ABS: 0.1 10*3/uL (ref 0.0–0.7)
EOS PCT: 1 % (ref 0–5)
HEMATOCRIT: 36.5 % (ref 36.0–46.0)
Hemoglobin: 11.8 g/dL — ABNORMAL LOW (ref 12.0–15.0)
Lymphocytes Relative: 49 % — ABNORMAL HIGH (ref 12–46)
Lymphs Abs: 3 10*3/uL (ref 0.7–4.0)
MCH: 23.6 pg — ABNORMAL LOW (ref 26.0–34.0)
MCHC: 32.3 g/dL (ref 30.0–36.0)
MCV: 73.1 fL — ABNORMAL LOW (ref 78.0–100.0)
MONO ABS: 0.4 10*3/uL (ref 0.1–1.0)
MPV: 10.1 fL (ref 8.6–12.4)
Monocytes Relative: 7 % (ref 3–12)
Neutro Abs: 2.6 10*3/uL (ref 1.7–7.7)
Neutrophils Relative %: 43 % (ref 43–77)
Platelets: 293 10*3/uL (ref 150–400)
RBC: 4.99 MIL/uL (ref 3.87–5.11)
RDW: 16.4 % — AB (ref 11.5–15.5)
WBC: 6.1 10*3/uL (ref 4.0–10.5)

## 2014-12-07 LAB — COMPREHENSIVE METABOLIC PANEL
ALT: 11 U/L (ref 6–29)
AST: 15 U/L (ref 10–30)
Albumin: 4.2 g/dL (ref 3.6–5.1)
Alkaline Phosphatase: 71 U/L (ref 33–115)
BUN: 16 mg/dL (ref 7–25)
CALCIUM: 9.6 mg/dL (ref 8.6–10.2)
CO2: 26 mmol/L (ref 20–31)
Chloride: 108 mmol/L (ref 98–110)
Creat: 1.09 mg/dL (ref 0.50–1.10)
Glucose, Bld: 96 mg/dL (ref 65–99)
Potassium: 4.4 mmol/L (ref 3.5–5.3)
Sodium: 143 mmol/L (ref 135–146)
Total Bilirubin: 0.5 mg/dL (ref 0.2–1.2)
Total Protein: 7.2 g/dL (ref 6.1–8.1)

## 2014-12-07 LAB — LIPASE: Lipase: 29 U/L (ref 7–60)

## 2014-12-07 LAB — C-REACTIVE PROTEIN: CRP: 1.5 mg/dL — AB (ref <0.60)

## 2014-12-07 MED ORDER — LINACLOTIDE 145 MCG PO CAPS: 145.0000 ug | ORAL_CAPSULE | Freq: Every day | ORAL | Status: DC

## 2014-12-07 MED ORDER — HYDROCORTISONE ACE-PRAMOXINE 2.5-1 % RE CREA: 1.0000 "application " | TOPICAL_CREAM | Freq: Three times a day (TID) | RECTAL | Status: DC

## 2014-12-07 NOTE — Patient Instructions (Signed)
1. Continue pantoprazole twice daily. 2. Start Linzess once daily on empty stomach to help with constipation. 3. Analpram cream to anorectal area three times daily for irritation.  4. Please have your labs done. We will contact you with results and further instructions.

## 2014-12-07 NOTE — Progress Notes (Signed)
Primary Care Physician: Deloria Lair, MD  Primary Gastroenterologist:  Garfield Cornea, MD   Chief Complaint  Patient presents with  . Abdominal Pain    HPI: Jennifer [name] is a 44 y.o. female here for further evaluation of abdominal pain and nausea. She was last seen in September 2015 for abdominal pain and 30 pound weight loss. EGD October 2015 showed mild erosive reflux esophagitis. CT abdomen pelvis with contrast October 2015 showed 3 cm exophytic liver lesion most likely benign hepatic adenoma or focal nodular hyperplasia. This was further evaluated with MRI liver which showed essentially stable benign-appearing cystic lesion along the inferior margin of liver compared to 2005. Felt to be a benign process. Possibly complex proteinaceous cyst or potentially a small collection of sequestered bile following cholecystectomy. No further imaging of this lesion recommended.Colonoscopy by Dr. Gala Romney January 2013 with external hemorrhoids.  Waking up in the morning with film on mouth/face, ?emesis on gown. Spot on lip came up as well two weeks ago, started out as a pimple. Doesn't seem to want to heal.  Lower abdominal pain and stomach feels hot. Had sweats with it. Pain noted after meals. Cannot afford Amitiza, now cost $400 because lost Medicaid. Symptoms started after off Amitiza. BM every 3 days. Sometimes can go with coffee. Some red blood on tissue. Stools not hard. No dysuria. Weight stable the last one year. Appetite not as good as it used to be. Forces breakfast. Then eats dinner. Worse over the last month. No junk food. Eats at 7-8pm. Drinks a lot of green herbal tea. Lays down around midnight. Some chest pressure, belching, foul order to mouth. Early satiety. Smelt like stool. Nausea daily.   Current Outpatient Prescriptions  Medication Sig Dispense Refill  . cetirizine (ZYRTEC) 10 MG tablet Take 10 mg by mouth every morning.    . citalopram (CELEXA) 40 MG tablet Take 40 mg by  mouth daily.    . fluticasone (FLONASE) 50 MCG/ACT nasal spray Place 1 spray into the nose 2 (two) times daily.     Marland Kitchen levETIRAcetam (KEPPRA) 750 MG tablet Take 750 mg by mouth at bedtime. Takes 3 tablets at bedtime    . meloxicam (MOBIC) 15 MG tablet Take 15 mg by mouth daily.    . metoCLOPramide (REGLAN) 5 MG tablet Take 5 mg by mouth 4 (four) times daily -  before meals and at bedtime.    . Multiple Vitamin (MULTIVITAMIN) tablet Take 1 tablet by mouth daily.    Marland Kitchen oxybutynin (DITROPAN) 5 MG tablet Take 5 mg by mouth 2 (two) times daily.    . pantoprazole (PROTONIX) 40 MG tablet Take 40 mg by mouth 2 (two) times daily before a meal.    . Umeclidinium-Vilanterol (ANORO ELLIPTA) 62.5-25 MCG/INH AEPB Inhale into the lungs.     No current facility-administered medications for this visit.    Allergies as of 12/07/2014 - Review Complete 05/04/2014  Allergen Reaction Noted  . Shellfish allergy Swelling 10/29/2013  . Codeine Other (See Comments)   . Hydrocodone  12/03/2012  . Ivp dye [iodinated diagnostic agents] Nausea And Vomiting 10/17/2010  . Latex Rash 03/08/2010   Past Medical History  Diagnosis Date  . Seizures ([place])   . PTSD (post-traumatic stress disorder)   . GERD (gastroesophageal reflux disease)   . Panic attack   . Asthma   . Anxiety   . Schizophrenia ([place])   . Bipolar 1 disorder ([place])   . S/P endoscopy Feb 2012  Nl, s/p 56-French [place] dilator, biopsy benign  . S/P endoscopy 2008    Mild gastritis  . History of flexible sigmoidoscopy 1999    Normal to 60cm,  . Diabetes mellitus without complication ([place])   . Ovarian cancer [facility])    Past Surgical History  Procedure Laterality Date  . Partial hysterectomy    . Cholecystectomy    . Tonsillectomy    . Hernia repair      Incisional with mesh  . Colonoscopy  03/01/2011    Rourk-External hemorrhoids/otherwise normal rectum and colon.  . Esophagogastroduodenoscopy  2008    gastritis  . Esophagogastroduodenoscopy   2012    Moderate sized hiatal hernia, non-H. pylori gastritis  . Esophagogastroduodenoscopy N/A 11/17/2013    RMR: Mild erosive reflux esophagitis. Patulous EG junction    ROS:  General: Negative for anorexia, weight loss, fever, chills, fatigue, weakness. ENT: Negative for hoarseness, difficulty swallowing , nasal congestion. CV: Negative for chest pain, angina, palpitations, dyspnea on exertion, peripheral edema.  Respiratory: Negative for dyspnea at rest, dyspnea on exertion, cough, sputum, wheezing.  GI: See history of present illness. GU:  Negative for dysuria, hematuria, urinary incontinence, urinary frequency, nocturnal urination.  Endo: Negative for unusual weight change.    Physical Examination:   BP 111/79 mmHg  Pulse 86  Temp(Src) 97.1 F (36.2 C) (Oral)  Ht 5\' 6"  (1.676 m)  Wt 231 lb 12.8 oz (105.144 kg)  BMI 37.43 kg/m2  General: Well-nourished, well-developed in no acute distress.  Eyes: No icterus. Mouth: Oropharyngeal mucosa moist and pink , no lesions erythema or exudate. Lungs: Clear to auscultation bilaterally.  Heart: Regular rate and rhythm, no murmurs rubs or gallops.  Abdomen: Bowel sounds are normal, nontender, nondistended, no hepatosplenomegaly or masses, no abdominal bruits or hernia , no rebound or guarding.   Extremities: No lower extremity edema. No clubbing or deformities. Neuro: Alert and oriented x 4   Skin: Warm and dry, no jaundice.   Psych: Alert and cooperative, normal mood and affect.  Labs:  06/2014 Calcium 9.3, total bilirubin 0.5, alkaline phosphatase 72, AST 11.9, ALT 9, albumin 4.0, white blood cell count 6300, hemoglobin 12.3, hematocrit 39.4, platelets 256,000, glucose 82, creatinine 1.15, BUN 16 Imaging Studies: No results found.

## 2014-12-09 ENCOUNTER — Telehealth: Payer: Self-pay | Admitting: Internal Medicine

## 2014-12-09 NOTE — Telephone Encounter (Signed)
Pt was seen on Monday 10/31 and called today to follow up on a PA for Linzess. She said that Jennifer [name] faxed [place] the PA forms. I told her that when the nurse fills those out and faxes to the insurance company we have to wait to hear back from them. She asked if she could get Linzess samples to hold her until the PA is approved. Please advise and call [phone]

## 2014-12-10 NOTE — Telephone Encounter (Signed)
I never received anything about a PA for this pt. Called [facility] and got her insurance information. Will work on [place]. #3 boxes of samples are at the front desk. Pt is aware.

## 2014-12-14 ENCOUNTER — Encounter: Payer: Self-pay | Admitting: Gastroenterology

## 2014-12-14 NOTE — Assessment & Plan Note (Signed)
Continue pantoprazole BID. Discussed antireflux measures. Monitor symptoms with adequate control constipation.

## 2014-12-14 NOTE — Assessment & Plan Note (Signed)
Trial of Linzess. Monitor symptoms after better control of constipation.

## 2014-12-14 NOTE — Assessment & Plan Note (Signed)
Lower abd pain, sweats at night, h/o elevated CRP. Repeat labs.

## 2014-12-15 NOTE — Telephone Encounter (Signed)
PA has been completed and sent to the insurance co.

## 2014-12-15 NOTE — Progress Notes (Signed)
CC'D TO PCP °

## 2014-12-28 ENCOUNTER — Other Ambulatory Visit: Payer: Self-pay | Admitting: Internal Medicine

## 2014-12-28 NOTE — Progress Notes (Signed)
Quick Note:  Please let patient know her CRP still suggests some inflammation in the body. Mild microcytic anemia. Other labs fine.  Please find out if she is still followed by an oncologist for ovarian cancer. Please find out if she is still having abdominal pain. Have her collect ifobt. ______

## 2014-12-30 ENCOUNTER — Other Ambulatory Visit: Payer: Self-pay | Admitting: Gastroenterology

## 2015-01-04 ENCOUNTER — Telehealth: Payer: Self-pay | Admitting: Internal Medicine

## 2015-01-04 NOTE — Telephone Encounter (Signed)
PATIENT CALLED AND STATED THAT SHE RECEIVED A LETTER FROM HER INSURANCE THAT THE LINZESS WOULD BE COVERED, PLEASE SEND PRESCRIPTION TO [facility] IN [place]

## 2015-01-04 NOTE — Telephone Encounter (Signed)
Pharmacy already has Rx for linzess. I have faxed over approval letter from insurance.

## 2015-01-13 NOTE — Progress Notes (Signed)
Quick Note:  Please remind patient to return her ifobt.  I am waiting to hear back from oncology regarding elevated CRP. ______

## 2015-02-02 ENCOUNTER — Telehealth: Payer: Self-pay | Admitting: Internal Medicine

## 2015-02-02 ENCOUNTER — Ambulatory Visit (INDEPENDENT_AMBULATORY_CARE_PROVIDER_SITE_OTHER): Payer: 59

## 2015-02-02 DIAGNOSIS — R109 Unspecified abdominal pain: Secondary | ICD-10-CM | POA: Diagnosis not present

## 2015-02-02 LAB — IFOBT (OCCULT BLOOD): IFOBT: NEGATIVE

## 2015-02-02 NOTE — Telephone Encounter (Signed)
Patient called the office asking if we treat the throat. I told her that we treat the esophagus. She then said that she has a knot in her throat and she needed to be seen. I told her that she should probably see her PCP first and if he felt it was GI related they would seen [place] a referral. She said that she doesn't see her PCP until 1/27 and didn't want to wait until then and if we treated the esophagus why couldn't she go ahead and make an OV now. I told her that it could be a number of things and not necessarily GI related, but if she wanted to speak with the nurse when she calls her with her stool results she could mention it to her then and see what she would advise. Otherwise she should see her PCP. Please advise. [id number]

## 2015-02-02 NOTE — Progress Notes (Signed)
Pt returned ifobt and it was negative.  

## 2015-02-02 NOTE — Telephone Encounter (Signed)
Patient came in to drop off her stool sample and said prior to collecting her stool she had a stomach virus for 3 weeks and noticed that her stools were watery and had clumps in it and it didn't look normal to her and she wanted the nurse to be aware of this. I told her that I would let the nurse know and she would check her stool results and get back with her.

## 2015-02-04 NOTE — Telephone Encounter (Signed)
Routing to [facility]. ifobt results are in epic

## 2015-02-09 NOTE — Telephone Encounter (Signed)
ifobt negative. Please find out more about the "throat issue".

## 2015-02-10 NOTE — Telephone Encounter (Signed)
Really doesn't sound like GI/esophageal. Suspect lymphadenopathy and recommend she follow through with seeing her PCP. In the interim, we could add something to cover for reflux esophagitis in addition to her pantoprazole BID. She can add Zantac 150mg  up to twice daily (alternate with her PPI) when she feels the irritation.

## 2015-02-10 NOTE — Telephone Encounter (Signed)
Spoke with the pt- she said she has a spot in her esophagus that is swollen and hurts sometimes. It doesn't hurt all the time. She said the spot is "close to where you take your pulse in your neck". She has had a cough for 4 weeks and a cold virus around Christmas. She coughed so much a few days a go that she saw bright red blood in the sink. She has an appt with Dr.Tapper on 03/05/15. She said they couldn't get her in any sooner.

## 2015-02-11 NOTE — Telephone Encounter (Signed)
Pt is aware.  

## 2015-02-13 NOTE — Progress Notes (Signed)
Quick Note:  Discussed with Robynn Pane. He noted ovarian pathology in 2005 WITHOUT Malignancy.  Patient supposed to come back in for follow up. Will discuss at this time. ______

## 2015-02-13 NOTE — Progress Notes (Signed)
Quick Note:  Please touch base with patient again. ifobt negative.  She has several issues we need to follow up on. Microcytic anemia, elevated CRP, abd pain. Please make follow up nonurgent ov. ______

## 2015-03-02 ENCOUNTER — Encounter: Payer: Self-pay | Admitting: Internal Medicine

## 2015-03-02 ENCOUNTER — Telehealth: Payer: Self-pay | Admitting: Internal Medicine

## 2015-03-02 ENCOUNTER — Ambulatory Visit: Payer: Self-pay | Admitting: Internal Medicine

## 2015-03-02 NOTE — Telephone Encounter (Signed)
PATIENT WAS A NO SHOW AND LETTER SENT  °

## 2015-04-27 ENCOUNTER — Ambulatory Visit (HOSPITAL_COMMUNITY): Payer: BLUE CROSS/BLUE SHIELD | Attending: Neurology

## 2015-04-27 ENCOUNTER — Encounter (HOSPITAL_COMMUNITY): Payer: Self-pay

## 2015-04-27 VITALS — BP 104/80 | HR 80

## 2015-04-27 DIAGNOSIS — R2681 Unsteadiness on feet: Secondary | ICD-10-CM

## 2015-04-27 DIAGNOSIS — R42 Dizziness and giddiness: Secondary | ICD-10-CM | POA: Diagnosis present

## 2015-04-27 DIAGNOSIS — R269 Unspecified abnormalities of gait and mobility: Secondary | ICD-10-CM | POA: Diagnosis present

## 2015-04-27 DIAGNOSIS — R29898 Other symptoms and signs involving the musculoskeletal system: Secondary | ICD-10-CM

## 2015-04-28 NOTE — Therapy (Addendum)
[place], [place], [address] Phone: [phone]   Fax:  [phone]  Physical Therapy Evaluation  Patient Details  Name: Jennifer [name] MRN: [id number] Date of Birth: 1970-11-19 Referring Provider: Dr. Phillips Odor  Encounter Date: 04/27/2015      PT End of Session - 04/27/15 0814    Visit Number 1   Number of Visits 13   Date for PT Re-Evaluation 05/27/15   Authorization Type BCBS    Authorization Time Period 04/27/2015 to 06/08/2015    Authorization - Visit Number 1   Authorization - Number of Visits 30   PT Start Time [id number]   PT Stop Time 1432   PT Time Calculation (min) 45 min   Equipment Utilized During Treatment Gait belt   Activity Tolerance Patient tolerated treatment well   Behavior During Therapy [facility] for tasks assessed/performed      Past Medical History  Diagnosis Date  . Seizures ([place])   . PTSD (post-traumatic stress disorder)   . GERD (gastroesophageal reflux disease)   . Panic attack   . Asthma   . Anxiety   . Schizophrenia ([place])   . Bipolar 1 disorder ([place])   . S/P endoscopy Feb 2012    Nl, s/p [facility] dilator, biopsy benign  . S/P endoscopy 2008    Mild gastritis  . History of flexible sigmoidoscopy 1999    Normal to 60cm,  . Diabetes mellitus without complication ([place])   . Ovarian cancer [facility])     Past Surgical History  Procedure Laterality Date  . Partial hysterectomy    . Cholecystectomy    . Tonsillectomy    . Hernia repair      Incisional with mesh  . Colonoscopy  03/01/2011    [place]-External hemorrhoids/otherwise normal rectum and colon.  . Esophagogastroduodenoscopy  2008    gastritis  . Esophagogastroduodenoscopy  2012    Moderate sized hiatal hernia, non-H. pylori gastritis  . Esophagogastroduodenoscopy N/A 11/17/2013    RMR: Mild erosive reflux esophagitis. Patulous EG junction    Filed Vitals:   04/27/15 1417 04/27/15 1419  BP: 104/80 mmhg; R UE  sitting 104/80 mmhg; R UE standing after 1 minute  Pulse: 80     Visit Diagnosis:  Unsteady gait - Plan: PT plan of care cert/re-cert  Weakness of both legs - Plan: PT plan of care cert/re-cert  Dizziness and giddiness - Plan: PT plan of care cert/re-cert  Abnormality of gait - Plan: PT plan of care cert/re-cert      Subjective Assessment - 04/27/15 1356    Subjective Jennifer [name] is a 45 yo female who currently c/o unsteady balance and  LE weakness that insidiously began ~ 5 months ago. Pt reported ~12 falls within the last 6 months. In addition, pt reported complaints with dizziness with sit to stand transfers.     Limitations Walking;Standing   How long can you sit comfortably? Unlimited    How long can you stand comfortably? 10 minutes; limited by fatigue    How long can you walk comfortably? 15 minutes; limited by fatigue   Patient Stated Goals Pt's goal is to improve her balance, eliminate her dizziness, and improve general strength.    Currently in Pain? No/denies   Multiple Pain Sites No            [facility] PT Assessment - 04/27/15 0001    Assessment   Medical Diagnosis Unsteady balance and gait    Referring  Provider Dr. Phillips Odor   Onset Date/Surgical Date 10/08/14   Hand Dominance Right   Next MD Visit Unknown   Prior Therapy No known therapy for current complaints    Precautions   Precautions Fall   Restrictions   Weight Bearing Restrictions No   Balance Screen   Has the patient fallen in the past 6 months Yes   How many times? 12   Has the patient had a decrease in activity level because of a fear of falling?  Yes   Is the patient reluctant to leave their home because of a fear of falling?  No   Home Environment   Living Environment Private residence   Living Arrangements Spouse/significant other   Type of [place] to enter   Entrance Stairs-Number of Steps 3   [place] One level   [place] - single point;[place] - standard   Prior Function   Level of Independence Needs assistance with ADLs   Cognition   Overall Cognitive Status Within Functional Limits for tasks assessed   Observation/Other Assessments   Focus on Therapeutic Outcomes (FOTO)  N/A   Sensation   Light Touch Appears Intact   Coordination   Finger Nose Finger Test WNL   Heel Shin Test WNL    ROM / Strength   AROM / PROM / Strength Strength   Strength   Strength Assessment Site Knee;Hip;Ankle   Right/Left Hip Right;Left   Right Hip Flexion 4-/5   Right Hip Extension 3+/5   Right Hip ABduction 3+/5   Right Hip ADduction 4-/5   Left Hip Flexion 4-/5   Left Hip Extension 3+/5   Left Hip ABduction 3+/5   Left Hip ADduction 4-/5   Right/Left Knee Right;Left   Right Knee Flexion 5/5   Right Knee Extension 5/5   Left Knee Flexion 4-/5   Left Knee Extension 4/5   Right/Left Ankle Right;Left   Right Ankle Dorsiflexion 5/5   Right Ankle Plantar Flexion 4/5   Right Ankle Inversion 4/5   Right Ankle Eversion 4/5   Left Ankle Dorsiflexion 5/5   Left Ankle Plantar Flexion 4/5   Left Ankle Inversion 4/5   Left Ankle Eversion 4/5   Transfers   Transfers Sit to Stand;Stand to Sit   Sit to Stand 7: Independent   Stand to Sit 7: Independent   Ambulation/Gait   Ambulation/Gait Yes   Ambulation/Gait Assistance 4: Min guard;5: Supervision   Ambulation Distance (Feet) 300 Feet   Assistive device None   Gait Pattern Ataxic;Decreased arm swing - right;Decreased arm swing - left   Ambulation Surface Level   Berg Balance Test   Sit to Stand Able to stand without using hands and stabilize independently   Standing Unsupported Able to stand safely 2 minutes   Sitting with Back Unsupported but Feet Supported on Floor or Stool Able to sit safely and securely 2 minutes   Stand to Sit Sits safely with minimal use of hands   Transfers Able to transfer safely, minor use of hands   Standing Unsupported with  Eyes Closed Able to stand 10 seconds safely   Standing Ubsupported with Feet Together Able to place feet together independently and stand for 1 minute with supervision   From Standing, Reach Forward with Outstretched Arm Can reach forward >12 cm safely (5")   From Standing Position, Pick up Object from Floor Able to pick up shoe, needs supervision  From Standing Position, Turn to Look Behind Over each Shoulder Looks behind from both sides and weight shifts well   Turn 360 Degrees Able to turn 360 degrees safely but slowly   Standing Unsupported, Alternately Place Feet on Step/Stool Able to stand independently and safely and complete 8 steps in 20 seconds   Standing Unsupported, One Foot in Front Able to take small step independently and hold 30 seconds   Standing on One Leg Able to lift leg independently and hold 5-10 seconds   Total Score 48   Timed Up and Go Test   Normal TUG (seconds) 15   TUG Comments no AD             Vestibular Assessment - 04/27/15 0001    Symptom Behavior   Type of Dizziness Lightheadedness   Frequency of Dizziness 3-4x/week    Duration of Dizziness less than 30 seconds    Aggravating Factors Sit to stand   Relieving Factors Rest   Occulomotor Exam   Occulomotor Alignment Normal   Spontaneous Absent   Gaze-induced Absent   Head shaking Horizontal Absent   Smooth Pursuits Intact   Saccades Intact   Other Tests   Tragal Negative    Orthostatics   BP sitting 104/80 mmHg  mmhg   BP standing (after 1 minute) 104/80 mmHg  mmhg                       PT Education - 04/27/15 0816    Education provided Yes   Education Details Educated pt on 5/5 fall precautions, PT eval findings, proper hydration, and to wait a moment upon standing before ambulating    Person(s) Educated Patient   Methods Explanation   Comprehension Verbalized understanding          PT Short Term Goals - 04/27/15 0802    PT SHORT TERM GOAL #1   Title Patient will  independently verbalize and demo proper completion of her initial HEP to continue with LE strengthening at home.    Time 2   Period Weeks   Status New   PT SHORT TERM GOAL #2   Title Patient will independently verbalize 5/5 fall precautions in order to reduce the risk for falls with household ambulation.   Time 3   Period Weeks   Status New   PT SHORT TERM GOAL #3   Title Patient will be able to ambulate with a SPC outdoors on level and unlevel terrain for 600' with improved heel to toe sequence and proper 2-point, step-through gait pattern in order to reduce the risk for falls with community ambulation   Time 3   Period Weeks   Status New           PT Long Term Goals - 04/28/15 0805    PT LONG TERM GOAL #1   Title Patient will independently verbalize and demo proper completion of her advanced HEP to continue with LE strengthening at home once DC from PT.    Time 6   Period Weeks   Status New   PT LONG TERM GOAL #2   Title TUG time will improve to <13 seconds without the use of a SPC in order to reduce the risk for falls.     Time 6   Period Weeks   Status New   PT LONG TERM GOAL #3   Title Patient will present with improved B LE strength to >4+/5 MMT grade in order to ambulate >  45 minutes with improved endurance and steady gait.    Time 6   Period Weeks   Status New   PT LONG TERM GOAL #4   Title Patient will improve her Merrilee Jansky balance score to >54/56 in order to reduce the risk for falls while completing ADLs and household chores.    Time 6   Period Weeks   Status New   PT LONG TERM GOAL #5   Title Patient will report less frequent dizziness to 1 episode per week in order to improve quality of life and reduce the risk for falls.    Time 6   Period Weeks   Status New               Plan - 04/27/15 0759    Clinical Impression Statement  Mrs. Monell is a 45 yo female who was referred to outpatient physical therapy secondary to unsteady gait/ balance and dizziness.  The patient currently presents with limitations and impairments including bilateral hip weakness, impaired dynamic balance, and unsteady gait. Saccades, smooth pursuit, spontaneous nystagmus, gaze induced nystagmus, and head thrust testing was within normal limits. Orthostatic hypotension assessment was negative for complaints of dizziness or drop in blood pressure with sit to stand transition. In addition, finger to nose and heel to shin coordination testing were within normal limits. Patient is at a risk for falls which is evident with TUG time of 15 seconds without the use of an assistive device and Berg balance score of 48/56. The patient would benefit from skilled physical therapy for 2x/week x 6 weeks to address current deficits in order to reduce the risk for falls and return to her prior level of function with daily and leisure activities. The patient is in agreement with proposed physical therapy POC and frequency.    Pt will benefit from skilled therapeutic intervention in order to improve on the following deficits Abnormal gait;Decreased strength;Difficulty walking;Dizziness;Decreased balance;Impaired flexibility   Rehab Potential Good   PT Frequency 2x / week   PT Duration 6 weeks   PT Treatment/Interventions ADLs/Self Care Home Management;DME Instruction;Gait training;Stair training;Functional mobility training;Therapeutic activities;Therapeutic exercise;Balance training;Neuromuscular re-education;Patient/family education;Manual techniques;Passive range of motion;Vestibular   PT Next Visit Plan Next visit to focus static/dynamic standing balance activities, B hip strengthening ( SLR 4 way), and gait training with a SPC.    PT Home Exercise Plan HEP not initiated this visit. Initiate HEP at next PT visit with addition of hip strengthening ther ex.    Recommended Other Services None at this time    Consulted and Agree with Plan of Care Patient         Problem List Patient Active Problem  List   Diagnosis Date Noted  . Lower abdominal pain 12/14/2014  . Nausea without vomiting 12/07/2014  . Dysuria 01/19/2014  . Upper abdominal pain 10/29/2013  . Abnormal weight loss 10/29/2013  . Hiatal hernia 08/20/2012  . Obesity 11/22/2011  . Atypical chest pain 09/21/2011  . HTN (hypertension) 09/21/2011  . Palpitations 09/21/2011  . Chondromalacia of left patella 05/23/2011  . Patellofemoral pain syndrome 04/24/2011  . Hematochezia 02/22/2011  . Knee pain 12/28/2010  . Muscle weakness (generalized) 12/28/2010  . Dysphagia 05/19/2010  . Vaginal discharge 05/19/2010  . Allergy to latex 03/08/2010  . PSYCHIATRIC DISORDER 03/10/2009  . ANEMIA 03/09/2009  . GASTROESOPHAGEAL REFLUX DISEASE, CHRONIC 03/09/2009  . Constipation 03/09/2009  . SEIZURE DISORDER 03/09/2009  . INSOMNIA 03/09/2009  . NAUSEA 03/09/2009  . Abdominal pain 03/09/2009  .  DERANGEMENT MENISCUS 11/11/2008  . H N P-LUMBAR 11/11/2008  . SCIATICA 11/11/2008    Garen Lah, PT, DPT  04/28/2015, 8:29 AM  [facility] [address] [place], [place], [address] Phone: [phone]   Fax:  [phone]  Name: Jennifer [name] MRN: [id number] Date of Birth: [birth year]

## 2015-05-04 ENCOUNTER — Other Ambulatory Visit: Payer: Self-pay | Admitting: Nurse Practitioner

## 2015-05-05 ENCOUNTER — Telehealth (HOSPITAL_COMMUNITY): Payer: Self-pay | Admitting: Physical Therapy

## 2015-05-05 ENCOUNTER — Ambulatory Visit (HOSPITAL_COMMUNITY): Payer: BLUE CROSS/BLUE SHIELD | Admitting: Physical Therapy

## 2015-05-05 NOTE — Telephone Encounter (Signed)
Pt states she forgot to call and cancel her appointment this AM. States she needs to figure out how she is going to pay her copay for her sessions and would like to cancel her appointments. She will call once she receives more info. Therapist notifying pt that we will place her on "hold" until we hear from her again.  5:51 PM,05/05/2015 [place], [facility] [phone]

## 2015-05-11 ENCOUNTER — Encounter (HOSPITAL_COMMUNITY): Payer: BLUE CROSS/BLUE SHIELD | Admitting: Physical Therapy

## 2015-05-13 ENCOUNTER — Encounter (HOSPITAL_COMMUNITY): Payer: BLUE CROSS/BLUE SHIELD | Admitting: Physical Therapy

## 2015-05-17 ENCOUNTER — Encounter (HOSPITAL_COMMUNITY): Payer: BLUE CROSS/BLUE SHIELD | Admitting: Physical Therapy

## 2015-05-19 ENCOUNTER — Encounter (HOSPITAL_COMMUNITY): Payer: BLUE CROSS/BLUE SHIELD

## 2015-05-20 ENCOUNTER — Other Ambulatory Visit: Payer: Self-pay | Admitting: Nurse Practitioner

## 2015-05-25 ENCOUNTER — Encounter (HOSPITAL_COMMUNITY): Payer: BLUE CROSS/BLUE SHIELD

## 2015-05-25 DIAGNOSIS — R42 Dizziness and giddiness: Secondary | ICD-10-CM | POA: Diagnosis not present

## 2015-05-25 DIAGNOSIS — Z79899 Other long term (current) drug therapy: Secondary | ICD-10-CM | POA: Diagnosis not present

## 2015-05-25 DIAGNOSIS — G40909 Epilepsy, unspecified, not intractable, without status epilepticus: Secondary | ICD-10-CM | POA: Diagnosis not present

## 2015-05-27 ENCOUNTER — Encounter (HOSPITAL_COMMUNITY): Payer: BLUE CROSS/BLUE SHIELD | Admitting: Physical Therapy

## 2015-06-01 ENCOUNTER — Encounter (HOSPITAL_COMMUNITY): Payer: Self-pay | Admitting: *Deleted

## 2015-06-01 ENCOUNTER — Emergency Department (HOSPITAL_COMMUNITY): Payer: BLUE CROSS/BLUE SHIELD

## 2015-06-01 ENCOUNTER — Emergency Department (HOSPITAL_COMMUNITY)
Admission: EM | Admit: 2015-06-01 | Discharge: 2015-06-01 | Disposition: A | Discharge status: Home / Self Care | Payer: BLUE CROSS/BLUE SHIELD | Attending: Emergency Medicine | Admitting: Emergency Medicine

## 2015-06-01 ENCOUNTER — Encounter (HOSPITAL_COMMUNITY): Payer: BLUE CROSS/BLUE SHIELD | Admitting: Physical Therapy

## 2015-06-01 DIAGNOSIS — F209 Schizophrenia, unspecified: Secondary | ICD-10-CM | POA: Insufficient documentation

## 2015-06-01 DIAGNOSIS — F319 Bipolar disorder, unspecified: Secondary | ICD-10-CM | POA: Insufficient documentation

## 2015-06-01 DIAGNOSIS — R112 Nausea with vomiting, unspecified: Secondary | ICD-10-CM | POA: Insufficient documentation

## 2015-06-01 DIAGNOSIS — K5900 Constipation, unspecified: Secondary | ICD-10-CM | POA: Diagnosis not present

## 2015-06-01 DIAGNOSIS — R109 Unspecified abdominal pain: Secondary | ICD-10-CM | POA: Diagnosis not present

## 2015-06-01 DIAGNOSIS — E119 Type 2 diabetes mellitus without complications: Secondary | ICD-10-CM | POA: Insufficient documentation

## 2015-06-01 DIAGNOSIS — F17210 Nicotine dependence, cigarettes, uncomplicated: Secondary | ICD-10-CM | POA: Diagnosis not present

## 2015-06-01 DIAGNOSIS — R1084 Generalized abdominal pain: Secondary | ICD-10-CM | POA: Diagnosis not present

## 2015-06-01 DIAGNOSIS — J45909 Unspecified asthma, uncomplicated: Secondary | ICD-10-CM | POA: Diagnosis not present

## 2015-06-01 LAB — COMPREHENSIVE METABOLIC PANEL
ALT: 12 U/L — AB (ref 14–54)
AST: 20 U/L (ref 15–41)
Albumin: 4.1 g/dL (ref 3.5–5.0)
Alkaline Phosphatase: 66 U/L (ref 38–126)
Anion gap: 10 (ref 5–15)
BUN: 12 mg/dL (ref 6–20)
CALCIUM: 9.3 mg/dL (ref 8.9–10.3)
CHLORIDE: 109 mmol/L (ref 101–111)
CO2: 23 mmol/L (ref 22–32)
Creatinine, Ser: 1.04 mg/dL — ABNORMAL HIGH (ref 0.44–1.00)
GFR calc Af Amer: >60 mL/min (ref >60)
Glucose, Bld: 113 mg/dL — ABNORMAL HIGH (ref 65–99)
Potassium: 3.6 mmol/L (ref 3.5–5.1)
Sodium: 142 mmol/L (ref 135–145)
Total Bilirubin: 0.2 mg/dL — ABNORMAL LOW (ref 0.3–1.2)
Total Protein: 7.5 g/dL (ref 6.5–8.1)

## 2015-06-01 LAB — DIFFERENTIAL
BASOS ABS: 0 10*3/uL (ref 0.0–0.1)
Basophils Relative: 0 %
EOS ABS: 0.1 10*3/uL (ref 0.0–0.7)
Eosinophils Relative: 1 %
LYMPHS ABS: 3.2 10*3/uL (ref 0.7–4.0)
LYMPHS PCT: 38 %
MONOS PCT: 6 %
Monocytes Absolute: 0.5 10*3/uL (ref 0.1–1.0)
Neutro Abs: 4.7 10*3/uL (ref 1.7–7.7)
Neutrophils Relative %: 55 %

## 2015-06-01 LAB — CBC
HEMATOCRIT: 37.1 % (ref 36.0–46.0)
HEMOGLOBIN: 12.1 g/dL (ref 12.0–15.0)
MCH: 23.4 pg — AB (ref 26.0–34.0)
MCHC: 32.6 g/dL (ref 30.0–36.0)
MCV: 71.6 fL — ABNORMAL LOW (ref 78.0–100.0)
Platelets: 271 10*3/uL (ref 150–400)
RBC: 5.18 MIL/uL — AB (ref 3.87–5.11)
RDW: 15.1 % (ref 11.5–15.5)
WBC: 8.5 10*3/uL (ref 4.0–10.5)

## 2015-06-01 LAB — URINALYSIS, ROUTINE W REFLEX MICROSCOPIC
BILIRUBIN URINE: NEGATIVE
Glucose, UA: NEGATIVE mg/dL
Hgb urine dipstick: NEGATIVE
Ketones, ur: NEGATIVE mg/dL
Leukocytes, UA: NEGATIVE
NITRITE: NEGATIVE
PH: 6 (ref 5.0–8.0)
Protein, ur: NEGATIVE mg/dL
SPECIFIC GRAVITY, URINE: 1.01 (ref 1.005–1.030)

## 2015-06-01 LAB — LIPASE, BLOOD: LIPASE: 24 U/L (ref 11–51)

## 2015-06-01 MED ORDER — ONDANSETRON HCL 4 MG/2ML IJ SOLN
4.0000 mg | Freq: Once | INTRAMUSCULAR | Status: AC
  Administered 2015-06-01: 4 mg via INTRAVENOUS
  Filled 2015-06-01: qty 2

## 2015-06-01 MED ORDER — DICYCLOMINE HCL 10 MG/ML IM SOLN
20.0000 mg | Freq: Once | INTRAMUSCULAR | Status: AC
  Administered 2015-06-01: 20 mg via INTRAMUSCULAR
  Filled 2015-06-01: qty 2

## 2015-06-01 MED ORDER — DIATRIZOATE MEGLUMINE & SODIUM 66-10 % PO SOLN
ORAL | Status: DC
Start: 2015-06-01 — End: 2015-06-01
  Filled 2015-06-01: qty 30

## 2015-06-01 MED ORDER — KETOROLAC TROMETHAMINE 30 MG/ML IJ SOLN
30.0000 mg | Freq: Once | INTRAMUSCULAR | Status: AC
  Administered 2015-06-01: 30 mg via INTRAVENOUS
  Filled 2015-06-01: qty 1

## 2015-06-01 MED ORDER — SODIUM CHLORIDE 0.9 % IV BOLUS (SEPSIS)
1000.0000 mL | Freq: Once | INTRAVENOUS | Status: AC
Start: 2015-06-01 — End: 2015-06-01
  Administered 2015-06-01: 1000 mL via INTRAVENOUS

## 2015-06-01 MED ORDER — ONDANSETRON 4 MG PO TBDP: 4.0000 mg | ORAL_TABLET | Freq: Three times a day (TID) | ORAL | Status: DC | PRN

## 2015-06-01 MED ORDER — DICYCLOMINE HCL 20 MG PO TABS: 20.0000 mg | ORAL_TABLET | Freq: Three times a day (TID) | ORAL | Status: DC

## 2015-06-01 NOTE — ED Notes (Signed)
Pt reports that she felt she needed to got to the bath room and took 2 table spoons of epsom salts followed by hot coffee and a cap of chlorox- She went to the bathroom four times and then began with lower abd pain. She reports no intercourse since December and states she has no discharge. She also reports a hx of a hernia repoair with mesh and an O&P, with gallbladder surgery.

## 2015-06-01 NOTE — ED Notes (Signed)
Pt c/o lower abdominal pain that started x 1 days ago; pt states she used epsom salt to produce a BM and had results;

## 2015-06-01 NOTE — ED Notes (Signed)
MD informed of pt report of bright red blood as well as nausea although she reports nausea is now better

## 2015-06-01 NOTE — ED Notes (Signed)
Unsuccessful IV stick x 2- physician in to assess

## 2015-06-01 NOTE — ED Notes (Signed)
Pt out of bed to bathroom- reports bright red stool in scant amount with return of N

## 2015-06-01 NOTE — ED Provider Notes (Addendum)
TIME SEEN: 4:30 AM  CHIEF COMPLAINT: Abdominal pain, vomiting  HPI: Pt is a 45 y.o. female with history of seizures, schizophrenia who presents to the emergency department complaints of diffuse crampy abdominal pain that started at 9 PM yesterday. Reports she started having pain and thought that she needed to have a bowel movement. States that she drank water with 2 tablespoons of Epsom salts and hot coffee and began having multiple bowel movements. States she had 4 nonbloody bowel movements at home. States that she has had nausea and vomiting and her pain is progressively gotten worse. Denies dysuria, hematuria, vaginal bleeding or discharge. Reports she is status post hysterectomy, bilateral salpingo-oophorectomy, cholecystectomy, hernia repair with mesh. States she was last sexually active in December. Has had previous history of STDs but this was many years ago. No aggravating or relieving factors to her pain.  Per nursing notes, he reports the patient drank Clorox. She states that she did not drink Clorox but that she will put it in her bath water, a small cap full, intermittently. States that she does this to "keep my skin clean".  ROS: See HPI Constitutional: no fever  Eyes: no drainage  ENT: no runny nose   Cardiovascular:  no chest pain  Resp: no SOB  GI:  vomiting GU: no dysuria Integumentary: no rash  Allergy: no hives  Musculoskeletal: no leg swelling  Neurological: no slurred speech ROS otherwise negative  PAST MEDICAL HISTORY/PAST SURGICAL HISTORY:  Past Medical History  Diagnosis Date  . Seizures ([place])   . PTSD (post-traumatic stress disorder)   . GERD (gastroesophageal reflux disease)   . Panic attack   . Asthma   . Anxiety   . Schizophrenia ([place])   . Bipolar 1 disorder ([place])   . S/P endoscopy Feb 2012    Nl, s/p [facility] dilator, biopsy benign  . S/P endoscopy 2008    Mild gastritis  . History of flexible sigmoidoscopy 1999    Normal to 60cm,  .  Diabetes mellitus without complication ([place])   . Ovarian cancer [facility])     MEDICATIONS:  Prior to Admission medications   Medication Sig Start Date End Date Taking? Authorizing Provider  busPIRone (BUSPAR) 10 MG tablet Take 10 mg by mouth 3 (three) times daily. 2 tabs, 3x/day    Historical Provider, MD  cetirizine (ZYRTEC) 10 MG tablet Take 10 mg by mouth every morning.    Historical Provider, MD  citalopram (CELEXA) 40 MG tablet Take 40 mg by mouth daily.    Historical Provider, MD  fluticasone (FLONASE) 50 MCG/ACT nasal spray Place 1 spray into the nose 2 (two) times daily.     Historical Provider, MD  hydrocortisone-pramoxine [facility]) 2.5-1 % rectal cream APPLY RECTALLY 3 TIMES DAILY. 12/30/14   [name], PA-C  levETIRAcetam (KEPPRA) 750 MG tablet Take 750 mg by mouth at bedtime. Takes 3 tablets at bedtime    Historical Provider, MD  Linaclotide (LINZESS) 145 MCG CAPS capsule Take 1 capsule (145 mcg total) by mouth daily. 12/07/14   [name], PA-C  meloxicam (MOBIC) 15 MG tablet Take 15 mg by mouth daily. Reported on 04/27/2015    Historical Provider, MD  metoCLOPramide (REGLAN) 5 MG tablet Take 5 mg by mouth 4 (four) times daily -  before meals and at bedtime.    Historical Provider, MD  Multiple Vitamin (MULTIVITAMIN) tablet Take 1 tablet by mouth daily.    Historical Provider, MD  nitroGLYCERIN (NITROSTAT) 0.4 MG SL tablet  Place 0.4 mg under the tongue every 5 (five) minutes as needed for chest pain.    Historical Provider, MD  oxybutynin (DITROPAN) 5 MG tablet Take 5 mg by mouth 2 (two) times daily.    Historical Provider, MD  pantoprazole (PROTONIX) 40 MG tablet Take 40 mg by mouth 2 (two) times daily before a meal.    Historical Provider, MD  pantoprazole (PROTONIX) 40 MG tablet TAKE (1) TABLET TWICE DAILY. 12/28/14   [name], PA-C  pantoprazole (PROTONIX) 40 MG tablet TAKE (1) TABLET BY MOUTH ONCE DAILY. 05/04/15   [name], PA-C  pantoprazole (PROTONIX) 40  MG tablet TAKE (1) TABLET BY MOUTH ONCE DAILY. 05/24/15   [name], PA-C  Umeclidinium-Vilanterol (ANORO ELLIPTA) 62.5-25 MCG/INH AEPB Inhale into the lungs.    Historical Provider, MD    ALLERGIES:  Allergies  Allergen Reactions  . Shellfish Allergy Swelling    Swells tongue  . Codeine Other (See Comments)    unknown  . Hydrocodone     Rash and itching  . Ivp Dye [Iodinated Diagnostic Agents] Nausea And Vomiting    Hands swelling/rash  . Latex Rash    SOCIAL HISTORY:  Social History  Substance Use Topics  . Smoking status: Current Some Day Smoker -- 0.25 packs/day for 26 years    Types: Cigarettes    Start date: 05/03/1984  . Smokeless tobacco: Never Used     Comment: 2 or 3 cigarettes some days/ doesn't smoke everyday  . Alcohol Use: 0.0 oz/week    0 Standard drinks or equivalent per week     Comment: occa    FAMILY HISTORY: Family History  Problem Relation Age of Onset  . Crohn's disease Maternal Aunt   . Breast cancer Maternal Grandmother   . Colon cancer Maternal Grandfather     61  . Pancreatic cancer Maternal Grandfather   . Crohn's disease Cousin     x 2 cousins  . Breast cancer Daughter 22    EXAM: BP 129/86 mmHg  Pulse 89  Temp(Src) 97.9 F (36.6 C) (Oral)  Resp 18  Ht 5\' 6"  (1.676 m)  Wt 231 lb (104.781 kg)  BMI 37.30 kg/m2  SpO2 100% CONSTITUTIONAL: Alert and oriented and responds appropriately to questions. Well-appearing; well-nourished HEAD: Normocephalic EYES: Conjunctivae clear, PERRL ENT: normal nose; no rhinorrhea; moist mucous membranes NECK: Supple, no meningismus, no LAD  CARD: RRR; S1 and S2 appreciated; no murmurs, no clicks, no rubs, no gallops RESP: Normal chest excursion without splinting or tachypnea; breath sounds clear and equal bilaterally; no wheezes, no rhonchi, no rales, no hypoxia or respiratory distress, speaking full sentences ABD/GI: Normal bowel sounds; non-distended; soft, abdomen is mildly diffusely tender to  palpation with intermittent voluntary guarding, no rebound, no peritoneal signs; no specific tenderness at McBurney's point BACK:  The back appears normal and is non-tender to palpation, there is no CVA tenderness EXT: Normal ROM in all joints; non-tender to palpation; no edema; normal capillary refill; no cyanosis, no calf tenderness or swelling    SKIN: Normal color for age and race; warm; no rash NEURO: Moves all extremities equally, sensation to light touch intact diffusely, cranial nerves II through XII intact PSYCH: The patient's mood and manner are appropriate. Grooming and personal hygiene are appropriate.  MEDICAL DECISION MAKING: Patient here with diffuse abdominal pain. May be from constipation that worsened after taking Epsom salts causing her to have peristalsis. She is diffusely tender on exam with intermittent guarding. Differential  also includes colitis, diverticulitis, appendicitis, UTI. Will obtain labs, urine and a CT of her abdomen and pelvis. We'll give IV fluids, Toradol, Zofran, Bentyl and reassess.  ED PROGRESS: 5:30 AM  Pt's labs are unremarkable. Urine shows no sign of infection. She reports feeling better after above medications. CT scan pending.  7:00 AM  Pt's CT scan shows no acute abnormality. She has no bowel obstruction. Appendix was not clearly in a fight but there is no pericecal inflammation. She's not specifically tender in the right lower quadrant but rather tender throughout her abdomen with intermittent voluntary guarding. It seems on my examination she is mostly tender in the upper abdomen. She has no leukocytosis or fever. She reports feeling better. She has had bowel movements in the emergency department. Did have one dominant with a small amount of bright red blood. No melena. I suspect that her symptoms are from constipation and taking Epsom salts today. We'll discharge with prescriptions for Zofran and Bentyl to take as needed. This time I do not feel she needs  further emergent workup. Patient and her husband at bedside are comfortable with this plan. She reports she has a PCP for follow-up. Discussed return precautions.  She has not had any further vomiting in the emergency department. She states her stool is softer than it was previously. Discussed with her that this also could be the beginning of a viral illness and she may develop diarrhea, fever. Her abdominal exam is now benign.   At this time, I do not feel there is any life-threatening condition present. I have reviewed and discussed all results (EKG, imaging, lab, urine as appropriate), exam findings with patient. I have reviewed nursing notes and appropriate previous records.  I feel the patient is safe to be discharged home without further emergent workup. Discussed usual and customary return precautions. Patient and family (if present) verbalize understanding and are comfortable with this plan.  Patient will follow-up with their primary care provider. If they do not have a primary care provider, information for follow-up has been provided to them. All questions have been answered.      [place], DO 06/01/15 [phone]

## 2015-06-01 NOTE — Discharge Instructions (Signed)
Abdominal Pain, Adult °Many things can cause abdominal pain. Usually, abdominal pain is not caused by a disease and will improve without treatment. It can often be observed and treated at home. Your health care provider will do a physical exam and possibly order blood tests and X-rays to help determine the seriousness of your pain. However, in many cases, more time must pass before a clear cause of the pain can be found. Before that point, your health care provider may not know if you need more testing or further treatment. °HOME CARE INSTRUCTIONS °Monitor your abdominal pain for any changes. The following actions may help to alleviate any discomfort you are experiencing: °· Only take over-the-counter or prescription medicines as directed by your health care provider. °· Do not take laxatives unless directed to do so by your health care provider. °· Try a clear liquid diet (broth, tea, or water) as directed by your health care provider. Slowly move to a bland diet as tolerated. °SEEK MEDICAL CARE IF: °· You have unexplained abdominal pain. °· You have abdominal pain associated with nausea or diarrhea. °· You have pain when you urinate or have a bowel movement. °· You experience abdominal pain that wakes you in the night. °· You have abdominal pain that is worsened or improved by eating food. °· You have abdominal pain that is worsened with eating fatty foods. °· You have a fever. °SEEK IMMEDIATE MEDICAL CARE IF: °· Your pain does not go away within 2 hours. °· You keep throwing up (vomiting). °· Your pain is felt only in portions of the abdomen, such as the right side or the left lower portion of the abdomen. °· You pass bloody or black tarry stools. °MAKE SURE YOU: °· Understand these instructions. °· Will watch your condition. °· Will get help right away if you are not doing well or get worse. °  °This information is not intended to replace advice given to you by your health care provider. Make sure you discuss  any questions you have with your health care provider. °  °Document Released: 11/02/2004 Document Revised: 10/14/2014 Document Reviewed: 10/02/2012 °Elsevier Interactive Patient Education ©2016 Elsevier Inc. ° °Constipation, Adult °Constipation is when a person has fewer than three bowel movements a week, has difficulty having a bowel movement, or has stools that are dry, hard, or larger than normal. As people grow older, constipation is more common. A low-fiber diet, not taking in enough fluids, and taking certain medicines may make constipation worse.  °CAUSES  °· Certain medicines, such as antidepressants, pain medicine, iron supplements, antacids, and water pills.   °· Certain diseases, such as diabetes, irritable bowel syndrome (IBS), thyroid disease, or depression.   °· Not drinking enough water.   °· Not eating enough fiber-rich foods.   °· Stress or travel.   °· Lack of physical activity or exercise.   °· Ignoring the urge to have a bowel movement.   °· Using laxatives too much.   °SIGNS AND SYMPTOMS  °· Having fewer than three bowel movements a week.   °· Straining to have a bowel movement.   °· Having stools that are hard, dry, or larger than normal.   °· Feeling full or bloated.   °· Pain in the lower abdomen.   °· Not feeling relief after having a bowel movement.   °DIAGNOSIS  °Your health care provider will take a medical history and perform a physical exam. Further testing may be done for severe constipation. Some tests may include: °· A barium enema X-ray to examine your rectum, colon, and, sometimes,   your small intestine.   A sigmoidoscopy to examine your lower colon.   A colonoscopy to examine your entire colon. TREATMENT  Treatment will depend on the severity of your constipation and what is causing it. Some dietary treatments include drinking more fluids and eating more fiber-rich foods. Lifestyle treatments may include regular exercise. If these diet and lifestyle recommendations do not  help, your health care provider may recommend taking over-the-counter laxative medicines to help you have bowel movements. Prescription medicines may be prescribed if over-the-counter medicines do not work.  HOME CARE INSTRUCTIONS   Eat foods that have a lot of fiber, such as fruits, vegetables, whole grains, and beans.  Limit foods high in fat and processed sugars, such as french fries, hamburgers, cookies, candies, and soda.   A fiber supplement may be added to your diet if you cannot get enough fiber from foods.   Drink enough fluids to keep your urine clear or pale yellow.   Exercise regularly or as directed by your health care provider.   Go to the restroom when you have the urge to go. Do not hold it.   Only take over-the-counter or prescription medicines as directed by your health care provider. Do not take other medicines for constipation without talking to your health care provider first.  [place] IF:   You have bright red blood in your stool.   Your constipation lasts for more than 4 days or gets worse.   You have abdominal or rectal pain.   You have thin, pencil-like stools.   You have unexplained weight loss. MAKE SURE YOU:   Understand these instructions.  Will watch your condition.  Will get help right away if you are not doing well or get worse.   This information is not intended to replace advice given to you by your health care provider. Make sure you discuss any questions you have with your health care provider.   Document Released: 10/22/2003 Document Revised: 02/13/2014 Document Reviewed: 11/04/2012 Elsevier Interactive Patient Education 2016 Elsevier Inc.  High-Fiber Diet Fiber, also called dietary fiber, is a type of carbohydrate found in fruits, vegetables, whole grains, and beans. A high-fiber diet can have many health benefits. Your health care provider may recommend a high-fiber diet to help:  Prevent constipation.  Fiber can make your bowel movements more regular.  Lower your cholesterol.  Relieve hemorrhoids, uncomplicated diverticulosis, or irritable bowel syndrome.  Prevent overeating as part of a weight-loss plan.  Prevent heart disease, type 2 diabetes, and certain cancers. WHAT IS MY PLAN? The recommended daily intake of fiber includes:  38 grams for men under age 84.  75 grams for men over age 15.  56 grams for women under age 48.  78 grams for women over age 59. You can get the recommended daily intake of dietary fiber by eating a variety of fruits, vegetables, grains, and beans. Your health care provider may also recommend a fiber supplement if it is not possible to get enough fiber through your diet. WHAT DO I NEED TO KNOW ABOUT A HIGH-FIBER DIET?  Fiber supplements have not been widely studied for their effectiveness, so it is better to get fiber through food sources.  Always check the fiber content on thenutrition facts label of any prepackaged food. Look for foods that contain at least 5 grams of fiber per serving.  Ask your dietitian if you have questions about specific foods that are related to your condition, especially if those foods are  not listed in the following section.  Increase your daily fiber consumption gradually. Increasing your intake of dietary fiber too quickly may cause bloating, cramping, or gas.  Drink plenty of water. Water helps you to digest fiber. WHAT FOODS CAN I EAT? Grains Whole-grain breads. Multigrain cereal. Oats and oatmeal. Brown rice. Barley. Bulgur wheat. [place]. Bran muffins. Popcorn. Rye wafer crackers. Vegetables Sweet potatoes. Spinach. Kale. Artichokes. Cabbage. Broccoli. Green peas. Carrots. Squash. Fruits Berries. Pears. Apples. Oranges. Avocados. Prunes and raisins. Dried figs. Meats and Other Protein Sources [place], kidney, pinto, and soy beans. Split peas. Lentils. Nuts and seeds. Dairy Fiber-fortified  yogurt. Beverages Fiber-fortified soy milk. Fiber-fortified orange juice. Other Fiber bars. The items listed above may not be a complete list of recommended foods or beverages. Contact your dietitian for more options. WHAT FOODS ARE NOT RECOMMENDED? Grains White bread. Pasta made with refined flour. White rice. Vegetables Fried potatoes. Canned vegetables. Well-cooked vegetables.  Fruits Fruit juice. Cooked, strained fruit. Meats and Other Protein Sources Fatty cuts of meat. Fried Sales executive or fried fish. Dairy Milk. Yogurt. Cream cheese. Sour cream. Beverages Soft drinks. Other Cakes and pastries. Butter and oils. The items listed above may not be a complete list of foods and beverages to avoid. Contact your dietitian for more information. WHAT ARE SOME TIPS FOR INCLUDING HIGH-FIBER FOODS IN MY DIET?  Eat a wide variety of high-fiber foods.  Make sure that half of all grains consumed each day are whole grains.  Replace breads and cereals made from refined flour or white flour with whole-grain breads and cereals.  Replace white rice with brown rice, bulgur wheat, or millet.  Start the day with a breakfast that is high in fiber, such as a cereal that contains at least 5 grams of fiber per serving.  Use beans in place of meat in soups, salads, or pasta.  Eat high-fiber snacks, such as berries, raw vegetables, nuts, or popcorn.   This information is not intended to replace advice given to you by your health care provider. Make sure you discuss any questions you have with your health care provider.   Document Released: 01/23/2005 Document Revised: 02/13/2014 Document Reviewed: 07/08/2013 Elsevier Interactive Patient Education 2016 Elsevier Inc.  Nausea and Vomiting Nausea is a sick feeling that often comes before throwing up (vomiting). Vomiting is a reflex where stomach contents come out of your mouth. Vomiting can cause severe loss of body fluids (dehydration). Children and  elderly adults can become dehydrated quickly, especially if they also have diarrhea. Nausea and vomiting are symptoms of a condition or disease. It is important to find the cause of your symptoms. CAUSES   Direct irritation of the stomach lining. This irritation can result from increased acid production (gastroesophageal reflux disease), infection, food poisoning, taking certain medicines (such as nonsteroidal anti-inflammatory drugs), alcohol use, or tobacco use.  Signals from the brain.These signals could be caused by a headache, heat exposure, an inner ear disturbance, increased pressure in the brain from injury, infection, a tumor, or a concussion, pain, emotional stimulus, or metabolic problems.  An obstruction in the gastrointestinal tract (bowel obstruction).  Illnesses such as diabetes, hepatitis, gallbladder problems, appendicitis, kidney problems, cancer, sepsis, atypical symptoms of a heart attack, or eating disorders.  Medical treatments such as chemotherapy and radiation.  Receiving medicine that makes you sleep (general anesthetic) during surgery. DIAGNOSIS Your caregiver may ask for tests to be done if the problems do not improve after a few days. Tests may also be done  if symptoms are severe or if the reason for the nausea and vomiting is not clear. Tests may include:  Urine tests.  Blood tests.  Stool tests.  Cultures (to look for evidence of infection).  X-rays or other imaging studies. Test results can help your caregiver make decisions about treatment or the need for additional tests. TREATMENT You need to stay well hydrated. Drink frequently but in small amounts.You may wish to drink water, sports drinks, clear broth, or eat frozen ice pops or gelatin dessert to help stay hydrated.When you eat, eating slowly may help prevent nausea.There are also some antinausea medicines that may help prevent nausea. HOME CARE INSTRUCTIONS   Take all medicine as directed by  your caregiver.  If you do not have an appetite, do not force yourself to eat. However, you must continue to drink fluids.  If you have an appetite, eat a normal diet unless your caregiver tells you differently.  Eat a variety of complex carbohydrates (rice, wheat, potatoes, bread), lean meats, yogurt, fruits, and vegetables.  Avoid high-fat foods because they are more difficult to digest.  Drink enough water and fluids to keep your urine clear or pale yellow.  If you are dehydrated, ask your caregiver for specific rehydration instructions. Signs of dehydration may include:  Severe thirst.  Dry lips and mouth.  Dizziness.  Dark urine.  Decreasing urine frequency and amount.  Confusion.  Rapid breathing or pulse. SEEK IMMEDIATE MEDICAL CARE IF:   You have blood or brown flecks (like coffee grounds) in your vomit.  You have black or bloody stools.  You have a severe headache or stiff neck.  You are confused.  You have severe abdominal pain.  You have chest pain or trouble breathing.  You do not urinate at least once every 8 hours.  You develop cold or clammy skin.  You continue to vomit for longer than 24 to 48 hours.  You have a fever. MAKE SURE YOU:   Understand these instructions.  Will watch your condition.  Will get help right away if you are not doing well or get worse.   This information is not intended to replace advice given to you by your health care provider. Make sure you discuss any questions you have with your health care provider.   Document Released: 01/23/2005 Document Revised: 04/17/2011 Document Reviewed: 06/22/2010 Elsevier Interactive Patient Education [employer].

## 2015-06-01 NOTE — ED Notes (Signed)
Pt drinking gastrografin- spouse at bedside

## 2015-06-02 DIAGNOSIS — K589 Irritable bowel syndrome without diarrhea: Secondary | ICD-10-CM | POA: Diagnosis not present

## 2015-06-02 DIAGNOSIS — K219 Gastro-esophageal reflux disease without esophagitis: Secondary | ICD-10-CM | POA: Diagnosis not present

## 2015-06-03 ENCOUNTER — Encounter (HOSPITAL_COMMUNITY): Payer: BLUE CROSS/BLUE SHIELD

## 2015-06-08 ENCOUNTER — Encounter (HOSPITAL_COMMUNITY): Payer: BLUE CROSS/BLUE SHIELD | Admitting: Physical Therapy

## 2015-06-15 ENCOUNTER — Encounter: Payer: Self-pay | Admitting: Internal Medicine

## 2015-06-15 ENCOUNTER — Ambulatory Visit (INDEPENDENT_AMBULATORY_CARE_PROVIDER_SITE_OTHER): Payer: BLUE CROSS/BLUE SHIELD | Admitting: Internal Medicine

## 2015-06-15 VITALS — BP 115/80 | HR 69 | Temp 97.1°F | Ht 66.0 in | Wt 218.0 lb

## 2015-06-15 DIAGNOSIS — R109 Unspecified abdominal pain: Secondary | ICD-10-CM

## 2015-06-15 DIAGNOSIS — K219 Gastro-esophageal reflux disease without esophagitis: Secondary | ICD-10-CM | POA: Diagnosis not present

## 2015-06-15 DIAGNOSIS — K5909 Other constipation: Secondary | ICD-10-CM | POA: Diagnosis not present

## 2015-06-15 DIAGNOSIS — F250 Schizoaffective disorder, bipolar type: Secondary | ICD-10-CM | POA: Diagnosis not present

## 2015-06-15 NOTE — Progress Notes (Signed)
Primary Care Physician:  Deloria Lair, MD Primary Gastroenterologist:  Dr. Scotty Court  HPI:  Jennifer [name] is a 45 y.o. female here for follow-up of constipation, abdominal pain, worsening GERD symptoms. She feels liquids pool from time to time in the back of her throat and she finds it difficult to swallow during these times. She really does not relate retrosternal burning radiating coming from her stomach upward. She denies true esophageal dysphagia to solids and pills. States Protonix 40 mg twice daily is not working. Seen in the ER recently for flare, complaining ofconstipation. Abdominal CT scan. And LFTs good. H&H improved although MCV remains somewhat low. Chem-12 was normal.  History of elevated CRP. Patient states she has had fevers at home from time to time.  Rolan Lipa was effective previously in combating constipation. However, her insurance would not pay for it. She takes Epson salts intermittently.  After communication with oncology previously, it was discovered the patient does not have a diagnosis of ovarian cancer. Weight down 13 pounds since her office visit here in October of last year.   Patient denies any abdominal pain, whatsoever today. She has not had any nausea or vomiting.  Past Medical History  Diagnosis Date  . Seizures ([place])   . PTSD (post-traumatic stress disorder)   . GERD (gastroesophageal reflux disease)   . Panic attack   . Asthma   . Anxiety   . Schizophrenia ([place])   . Bipolar 1 disorder ([place])   . S/P endoscopy Feb 2012    Nl, s/p 56-[facility] dilator, biopsy benign  . S/P endoscopy 2008    Mild gastritis  . History of flexible sigmoidoscopy 1999    Normal to 60cm,  . Diabetes mellitus without complication ([place])   . Ovarian cancer [facility])     Past Surgical History  Procedure Laterality Date  . Partial hysterectomy    . Cholecystectomy    . Tonsillectomy    . Hernia repair      Incisional with mesh  . Colonoscopy  03/01/2011   Areonna Bran-External hemorrhoids/otherwise normal rectum and colon.  . Esophagogastroduodenoscopy  2008    gastritis  . Esophagogastroduodenoscopy  2012    Moderate sized hiatal hernia, non-H. pylori gastritis  . Esophagogastroduodenoscopy N/A 11/17/2013    RMR: Mild erosive reflux esophagitis. Patulous EG junction    Prior to Admission medications   Medication Sig Start Date End Date Taking? Authorizing Provider  busPIRone (BUSPAR) 10 MG tablet Take 10 mg by mouth 3 (three) times daily. 2 tabs, 3x/day   Yes Historical Provider, MD  cetirizine (ZYRTEC) 10 MG tablet Take 10 mg by mouth every morning.   Yes Historical Provider, MD  citalopram (CELEXA) 40 MG tablet Take 40 mg by mouth daily.   Yes Historical Provider, MD  dicyclomine (BENTYL) 20 MG tablet Take 1 tablet (20 mg total) by mouth 3 (three) times daily before meals. As needed for abdominal cramping 06/01/15  Yes Kristen N Ward, DO  fluticasone (FLONASE) 50 MCG/ACT nasal spray Place 1 spray into the nose 2 (two) times daily.    Yes Historical Provider, MD  hydrocortisone-pramoxine [facility]) 2.5-1 % rectal cream APPLY RECTALLY 3 TIMES DAILY. 12/30/14  Yes [name], PA-C  levETIRAcetam (KEPPRA) 750 MG tablet Take 750 mg by mouth at bedtime. Takes 3 tablets at bedtime   Yes Historical Provider, MD  Linaclotide (LINZESS) 145 MCG CAPS capsule Take 1 capsule (145 mcg total) by mouth daily. 12/07/14  Yes [name],  PA-C  meloxicam (MOBIC) 15 MG tablet Take 15 mg by mouth daily. Reported on 04/27/2015   Yes Historical Provider, MD  metoCLOPramide (REGLAN) 5 MG tablet Take 5 mg by mouth 4 (four) times daily -  before meals and at bedtime.   Yes Historical Provider, MD  Multiple Vitamin (MULTIVITAMIN) tablet Take 1 tablet by mouth daily.   Yes Historical Provider, MD  nitroGLYCERIN (NITROSTAT) 0.4 MG SL tablet Place 0.4 mg under the tongue every 5 (five) minutes as needed for chest pain.   Yes Historical Provider, MD  ondansetron (ZOFRAN  ODT) 4 MG disintegrating tablet Take 1 tablet (4 mg total) by mouth every 8 (eight) hours as needed for nausea or vomiting. 06/01/15  Yes Kristen N Ward, DO  oxybutynin (DITROPAN) 5 MG tablet Take 5 mg by mouth 2 (two) times daily.   Yes Historical Provider, MD  pantoprazole (PROTONIX) 40 MG tablet Take 40 mg by mouth 2 (two) times daily before a meal.   Yes Historical Provider, MD  pantoprazole (PROTONIX) 40 MG tablet TAKE (1) TABLET TWICE DAILY. 12/28/14  Yes [name], PA-C  pantoprazole (PROTONIX) 40 MG tablet TAKE (1) TABLET BY MOUTH ONCE DAILY. 05/04/15  Yes [name], PA-C  pantoprazole (PROTONIX) 40 MG tablet TAKE (1) TABLET BY MOUTH ONCE DAILY. 05/24/15  Yes [name], PA-C  Umeclidinium-Vilanterol (ANORO ELLIPTA) 62.5-25 MCG/INH AEPB Inhale into the lungs.   Yes Historical Provider, MD    Allergies as of 06/15/2015 - Review Complete 06/15/2015  Allergen Reaction Noted  . Shellfish allergy Swelling 10/29/2013  . Codeine Other (See Comments)   . Hydrocodone  12/03/2012  . Ivp dye [iodinated diagnostic agents] Nausea And Vomiting 10/17/2010  . Latex Rash 03/08/2010    Family History  Problem Relation Age of Onset  . Crohn's disease Maternal Aunt   . Breast cancer Maternal Grandmother   . Colon cancer Maternal Grandfather     66  . Pancreatic cancer Maternal Grandfather   . Crohn's disease Cousin     x 2 cousins  . Breast cancer Daughter 62    Social History   Social History  . Marital Status: Married    Spouse Name: N/A  . Number of Children: N/A  . Years of Education: N/A   Occupational History  . disabled    Social History Main Topics  . Smoking status: Current Some Day Smoker -- 0.25 packs/day for 26 years    Types: Cigarettes    Start date: 05/03/1984  . Smokeless tobacco: Never Used     Comment: 2 or 3 cigarettes some days/ doesn't smoke everyday  . Alcohol Use: 0.0 oz/week    0 Standard drinks or equivalent per week     Comment: occa  . Drug  Use: No  . Sexual Activity: Yes    Birth Control/ Protection: Surgical   Other Topics Concern  . Not on file   Social History Narrative    Review of Systems: See HPI, otherwise negative ROS  Physical Exam: BP 115/80 mmHg  Pulse 69  Temp(Src) 97.1 F (36.2 C)  Ht 5\' 6"  (1.676 m)  Wt 218 lb (98.884 kg)  BMI 35.20 kg/m2 General:   Alert,   pleasant and cooperative in NAD Skin:  Intact without significant lesions or rashes. Eyes:  Sclera clear, no icterus.   Conjunctiva pink. Ears:  Normal auditory acuity. Nose:  No deformity, discharge,  or lesions. Mouth:  No deformity or lesions. Neck:  Supple; no masses  or thyromegaly. No significant cervical adenopathy. Lungs:  Clear throughout to auscultation.   No wheezes, crackles, or rhonchi. No acute distress. Heart:  Regular rate and rhythm; no murmurs, clicks, rubs,  or gallops. Abdomen: Non-distended, normal bowel sounds.  Soft and nontender without appreciable mass or hepatosplenomegaly.  Pulses:  Normal pulses noted. Extremities:  Without clubbing or edema.  Impression;  Pleasant 45 year old lady with chronic constipation and GERD presented to the emergency department recently with a flare of symptoms. CT negative. Hemoglobin and hematocrit in the normal range but persisting microcytic indices. She is Hemoccult negative here. She is up-to-date on colonoscopy and EGD. Some of her throat complaints may or may not be related to reflux. She states Protonix 40 mg twice daily is really not helping her reflux symptoms; she does not have any true esophageal dysphagia symptoms at this time. History of elevated CRP of uncertain significance. Subjective report of fevers of uncertain significance. Weight loss notable.  She may have some sinus issues to account for some of her throat complaints.   Recommendations:  Stop protonix for now; try Nexium 40 mg daily for the next 3 weeks samples provided.  Begin Miralax 1 capful daily to twice  daily as needed for constipation  Would avoid using epsom salts  Repeat CRP now;  office visit in 6 weeks        Notice: This dictation was prepared with Dragon dictation along with smaller phrase technology. Any transcriptional errors that result from this process are unintentional and may not be corrected upon review.

## 2015-06-15 NOTE — Patient Instructions (Signed)
Stop protonix for now; try Dexilant 60 mg once daily x 3 weeks  Begin Miralax 1 capful daily to twice daily as needed for constipation  Would avoid using epsom salts  Repeat CRP office visit in 6 weeks

## 2015-06-16 DIAGNOSIS — I888 Other nonspecific lymphadenitis: Secondary | ICD-10-CM | POA: Diagnosis not present

## 2015-06-16 DIAGNOSIS — L298 Other pruritus: Secondary | ICD-10-CM | POA: Diagnosis not present

## 2015-06-16 DIAGNOSIS — L905 Scar conditions and fibrosis of skin: Secondary | ICD-10-CM | POA: Diagnosis not present

## 2015-06-16 DIAGNOSIS — L299 Pruritus, unspecified: Secondary | ICD-10-CM | POA: Diagnosis not present

## 2015-06-17 LAB — C-REACTIVE PROTEIN: CRP: 1.4 mg/dL — ABNORMAL HIGH (ref <0.60)

## 2015-06-30 ENCOUNTER — Ambulatory Visit (INDEPENDENT_AMBULATORY_CARE_PROVIDER_SITE_OTHER): Payer: BLUE CROSS/BLUE SHIELD | Admitting: Cardiovascular Disease

## 2015-06-30 VITALS — BP 110/78 | HR 106 | Ht 66.0 in | Wt 220.0 lb

## 2015-06-30 DIAGNOSIS — R0609 Other forms of dyspnea: Secondary | ICD-10-CM | POA: Diagnosis not present

## 2015-06-30 DIAGNOSIS — R002 Palpitations: Secondary | ICD-10-CM

## 2015-06-30 DIAGNOSIS — Z716 Tobacco abuse counseling: Secondary | ICD-10-CM

## 2015-06-30 DIAGNOSIS — J438 Other emphysema: Secondary | ICD-10-CM

## 2015-06-30 DIAGNOSIS — R0600 Dyspnea, unspecified: Secondary | ICD-10-CM

## 2015-06-30 NOTE — Patient Instructions (Signed)
Medication Instructions:  Your physician recommends that you continue on your current medications as directed. Please refer to the Current Medication list given to you today.   Labwork: NONE  Testing/Procedures: NONE  Follow-Up: Your physician recommends that you schedule a follow-up appointment in: AS NEEDED      Any Other Special Instructions Will Be Listed Below (If Applicable).     If you need a refill on your cardiac medications before your next appointment, please call your pharmacy.   

## 2015-06-30 NOTE — Progress Notes (Signed)
Patient ID: Jennifer [name], female   DOB: [birth year], 45 y.o.   MRN: [id number]      SUBJECTIVE: The patient presents for follow-up of palpitations and exertional dyspnea. Cardiac testing has been unremarkable. Pulmonary function testing demonstrated pulmonary pathology for which she sees a pulmonologist. She has been known to be noncompliant with medications in the past. She has psychiatric problems as well. Low risk nuclear myocardial perfusion study 04/10/13. LVEF 63%.  She smokes anywhere between 3 and 10 cigarettes daily. She has COPD. She said she has been unable to properly use inhalers prescribed to her by her pulmonologist. Denies chest pain.  Review of Systems: As per "subjective", otherwise negative.  Allergies  Allergen Reactions  . Shellfish Allergy Swelling    Swells tongue  . Codeine Other (See Comments)    unknown  . Hydrocodone     Rash and itching  . Ivp Dye [Iodinated Diagnostic Agents] Nausea And Vomiting    Hands swelling/rash  . Latex Rash    Current Outpatient Prescriptions  Medication Sig Dispense Refill  . busPIRone (BUSPAR) 10 MG tablet Take 10 mg by mouth 3 (three) times daily. 2 tabs, 3x/day    . cetirizine (ZYRTEC) 10 MG tablet Take 10 mg by mouth every morning.    . citalopram (CELEXA) 40 MG tablet Take 40 mg by mouth daily.    . fluticasone (FLONASE) 50 MCG/ACT nasal spray Place 1 spray into the nose 2 (two) times daily.     Marland Kitchen gabapentin (NEURONTIN) 100 MG capsule Take 100 mg by mouth 3 (three) times daily.  1  . hydrocortisone-pramoxine (ANALPRAM-HC) 2.5-1 % rectal cream APPLY RECTALLY 3 TIMES DAILY. 30 g 0  . levETIRAcetam (KEPPRA) 750 MG tablet Take 750 mg by mouth at bedtime. Takes 3 tablets at bedtime    . Levetiracetam 750 MG TB24 Take 750 mg by mouth at bedtime.  4  . Linaclotide (LINZESS) 145 MCG CAPS capsule Take 1 capsule (145 mcg total) by mouth daily. 30 capsule 11  . meloxicam (MOBIC) 15 MG tablet Take 15 mg by mouth daily. Reported on  04/27/2015    . metoCLOPramide (REGLAN) 5 MG tablet Take 5 mg by mouth 4 (four) times daily -  before meals and at bedtime.    . Multiple Vitamin (MULTIVITAMIN) tablet Take 1 tablet by mouth daily.    . nitroGLYCERIN (NITROSTAT) 0.4 MG SL tablet Place 0.4 mg under the tongue every 5 (five) minutes as needed for chest pain.    Marland Kitchen ondansetron (ZOFRAN ODT) 4 MG disintegrating tablet Take 1 tablet (4 mg total) by mouth every 8 (eight) hours as needed for nausea or vomiting. 20 tablet 0  . oxybutynin (DITROPAN) 5 MG tablet Take 5 mg by mouth 2 (two) times daily.    . pantoprazole (PROTONIX) 40 MG tablet Take 40 mg by mouth 2 (two) times daily before a meal.    . pantoprazole (PROTONIX) 40 MG tablet TAKE (1) TABLET TWICE DAILY. 60 tablet 11  . pantoprazole (PROTONIX) 40 MG tablet TAKE (1) TABLET BY MOUTH ONCE DAILY. 30 tablet 5  . pantoprazole (PROTONIX) 40 MG tablet TAKE (1) TABLET BY MOUTH ONCE DAILY. 30 tablet 11   No current facility-administered medications for this visit.    Past Medical History  Diagnosis Date  . Seizures ([place])   . PTSD (post-traumatic stress disorder)   . GERD (gastroesophageal reflux disease)   . Panic attack   . Asthma   . Anxiety   . Schizophrenia ([place])   .  Bipolar 1 disorder ([place])   . S/P endoscopy Feb 2012    Nl, s/p 56-[facility] dilator, biopsy benign  . S/P endoscopy 2008    Mild gastritis  . History of flexible sigmoidoscopy 1999    Normal to 60cm,  . Diabetes mellitus without complication ([place])   . Ovarian cancer [facility])     Past Surgical History  Procedure Laterality Date  . Partial hysterectomy    . Cholecystectomy    . Tonsillectomy    . Hernia repair      Incisional with mesh  . Colonoscopy  03/01/2011    Rourk-External hemorrhoids/otherwise normal rectum and colon.  . Esophagogastroduodenoscopy  2008    gastritis  . Esophagogastroduodenoscopy  2012    Moderate sized hiatal hernia, non-H. pylori gastritis  . Esophagogastroduodenoscopy N/A  11/17/2013    RMR: Mild erosive reflux esophagitis. Patulous EG junction    Social History   Social History  . Marital Status: Married    Spouse Name: N/A  . Number of Children: N/A  . Years of Education: N/A   Occupational History  . disabled    Social History Main Topics  . Smoking status: Current Some Day Smoker -- 0.25 packs/day for 26 years    Types: Cigarettes    Start date: 05/03/1984  . Smokeless tobacco: Never Used     Comment: 2 or 3 cigarettes some days/ doesn't smoke everyday  . Alcohol Use: 0.0 oz/week    0 Standard drinks or equivalent per week     Comment: occa  . Drug Use: No  . Sexual Activity: Yes    Birth Control/ Protection: Surgical   Other Topics Concern  . Not on file   Social History Narrative     Filed Vitals:   06/30/15 1021  BP: 110/78  Pulse: 106  Height: 5\' 6"  (1.676 m)  Weight: 220 lb (99.791 kg)  SpO2: 96%    PHYSICAL EXAM General: NAD HEENT: Normal. Neck: No JVD, no thyromegaly. Lungs: Faint expiratory wheezes, no rales. CV: Nondisplaced PMI.  Regular rate and rhythm, normal S1/S2, no S3/S4, no murmur. No pretibial or periankle edema.  Abdomen: Soft, nontender, no distention.  Neurologic: Alert and oriented.  Psych: Normal affect. Skin: Normal. Musculoskeletal: No gross deformities.    ECG: Most recent ECG reviewed.      ASSESSMENT AND PLAN: 1. SOB: Due to COPD and chronic tobacco abuse. Encouraged cessation. Also encouraged her to try and use inhalers.  2. Palpitations: Likely being triggered by lung disease. No indication for medical therapy at this time.  3. Tobacco abuse: Cessation counseling provided (2 minutes).  Dispo: fu prn.   [name], M.D., F.A.C.C.

## 2015-07-07 DIAGNOSIS — B350 Tinea barbae and tinea capitis: Secondary | ICD-10-CM | POA: Diagnosis not present

## 2015-07-07 DIAGNOSIS — L905 Scar conditions and fibrosis of skin: Secondary | ICD-10-CM | POA: Diagnosis not present

## 2015-07-07 DIAGNOSIS — K589 Irritable bowel syndrome without diarrhea: Secondary | ICD-10-CM | POA: Diagnosis not present

## 2015-07-07 DIAGNOSIS — K210 Gastro-esophageal reflux disease with esophagitis: Secondary | ICD-10-CM | POA: Diagnosis not present

## 2015-07-07 DIAGNOSIS — J018 Other acute sinusitis: Secondary | ICD-10-CM | POA: Diagnosis not present

## 2015-07-07 DIAGNOSIS — L299 Pruritus, unspecified: Secondary | ICD-10-CM | POA: Diagnosis not present

## 2015-07-12 DIAGNOSIS — D2262 Melanocytic nevi of left upper limb, including shoulder: Secondary | ICD-10-CM | POA: Diagnosis not present

## 2015-07-22 DIAGNOSIS — Z9049 Acquired absence of other specified parts of digestive tract: Secondary | ICD-10-CM | POA: Diagnosis not present

## 2015-07-22 DIAGNOSIS — Z8489 Family history of other specified conditions: Secondary | ICD-10-CM | POA: Diagnosis not present

## 2015-07-22 DIAGNOSIS — Z90710 Acquired absence of both cervix and uterus: Secondary | ICD-10-CM | POA: Diagnosis not present

## 2015-07-22 DIAGNOSIS — F319 Bipolar disorder, unspecified: Secondary | ICD-10-CM | POA: Diagnosis not present

## 2015-07-22 DIAGNOSIS — K219 Gastro-esophageal reflux disease without esophagitis: Secondary | ICD-10-CM | POA: Diagnosis not present

## 2015-07-22 DIAGNOSIS — R569 Unspecified convulsions: Secondary | ICD-10-CM | POA: Diagnosis not present

## 2015-07-22 DIAGNOSIS — D1779 Benign lipomatous neoplasm of other sites: Secondary | ICD-10-CM | POA: Diagnosis not present

## 2015-07-22 DIAGNOSIS — L989 Disorder of the skin and subcutaneous tissue, unspecified: Secondary | ICD-10-CM | POA: Diagnosis not present

## 2015-07-22 DIAGNOSIS — I781 Nevus, non-neoplastic: Secondary | ICD-10-CM | POA: Diagnosis not present

## 2015-07-22 DIAGNOSIS — J45909 Unspecified asthma, uncomplicated: Secondary | ICD-10-CM | POA: Diagnosis not present

## 2015-07-22 DIAGNOSIS — R7303 Prediabetes: Secondary | ICD-10-CM | POA: Diagnosis not present

## 2015-07-22 DIAGNOSIS — D1722 Benign lipomatous neoplasm of skin and subcutaneous tissue of left arm: Secondary | ICD-10-CM | POA: Diagnosis not present

## 2015-07-22 DIAGNOSIS — D2262 Melanocytic nevi of left upper limb, including shoulder: Secondary | ICD-10-CM | POA: Diagnosis not present

## 2015-07-22 DIAGNOSIS — Z79899 Other long term (current) drug therapy: Secondary | ICD-10-CM | POA: Diagnosis not present

## 2015-07-27 ENCOUNTER — Ambulatory Visit: Payer: BLUE CROSS/BLUE SHIELD | Admitting: Internal Medicine

## 2015-08-03 ENCOUNTER — Encounter (HOSPITAL_COMMUNITY): Payer: Self-pay | Admitting: Emergency Medicine

## 2015-08-03 ENCOUNTER — Emergency Department (HOSPITAL_COMMUNITY)
Admission: EM | Admit: 2015-08-03 | Discharge: 2015-08-03 | Disposition: A | Discharge status: Home / Self Care | Payer: BLUE CROSS/BLUE SHIELD | Attending: Emergency Medicine | Admitting: Emergency Medicine

## 2015-08-03 DIAGNOSIS — E119 Type 2 diabetes mellitus without complications: Secondary | ICD-10-CM | POA: Insufficient documentation

## 2015-08-03 DIAGNOSIS — F17210 Nicotine dependence, cigarettes, uncomplicated: Secondary | ICD-10-CM | POA: Insufficient documentation

## 2015-08-03 DIAGNOSIS — Y658 Other specified misadventures during surgical and medical care: Secondary | ICD-10-CM | POA: Diagnosis not present

## 2015-08-03 DIAGNOSIS — J45909 Unspecified asthma, uncomplicated: Secondary | ICD-10-CM | POA: Insufficient documentation

## 2015-08-03 DIAGNOSIS — F209 Schizophrenia, unspecified: Secondary | ICD-10-CM | POA: Diagnosis not present

## 2015-08-03 DIAGNOSIS — Z79899 Other long term (current) drug therapy: Secondary | ICD-10-CM | POA: Insufficient documentation

## 2015-08-03 DIAGNOSIS — T8130XA Disruption of wound, unspecified, initial encounter: Secondary | ICD-10-CM | POA: Diagnosis not present

## 2015-08-03 DIAGNOSIS — Z8543 Personal history of malignant neoplasm of ovary: Secondary | ICD-10-CM | POA: Diagnosis not present

## 2015-08-03 DIAGNOSIS — T8133XA Disruption of traumatic injury wound repair, initial encounter: Secondary | ICD-10-CM | POA: Diagnosis not present

## 2015-08-03 DIAGNOSIS — T8131XA Disruption of external operation (surgical) wound, not elsewhere classified, initial encounter: Secondary | ICD-10-CM

## 2015-08-03 DIAGNOSIS — Z4801 Encounter for change or removal of surgical wound dressing: Secondary | ICD-10-CM | POA: Diagnosis present

## 2015-08-03 NOTE — Discharge Instructions (Signed)
Please contact Dr Ladona Horns to have him reassess your wound in the morning.    Wound Dehiscence Wound dehiscence is when a surgical cut (incision) breaks open and does not heal properly after surgery. It usually happens 7-10 days after surgery. This can be a serious condition. It is important to identify and treat this condition early.  CAUSES  Some common causes of wound dehiscence include:  Stretching of the wound area. This may be caused by lifting, vomiting, violent coughing, or straining during bowel movements.  Wound infection.  Early stitch (suture) removal. RISK FACTORS Various things can increase your risk of developing wound dehiscence, including:  Obesity.  Lung disease.  Smoking.  Poor nutrition.  Contamination during surgery. SIGNS AND SYMPTOMS  Bleeding from the wound.  Pain.  Fever.  Wound starts breaking open. DIAGNOSIS  Your health care provider may diagnose wound dehiscence by monitoring the incision and noting any changes in the wound. These changes can include an increase in drainage or pain. The health care provider may also ask you if you have noticed any stretching or tearing of the wound.  Wound cultures may be taken to determine if there is an infection.  Imaging studies, such as an MRI scan or CT scan, may be done to determine if there is a collection of pus or fluid in the wound area. TREATMENT Treatment may include:  Wound care.  Surgical repair.  Antibiotic medicine to treat or prevent infection.  Medicines to reduce pain and swelling. HOME CARE INSTRUCTIONS   Only take over-the-counter or prescription medicines for pain, discomfort, or fever as directed by your health care provider. Taking pain medicine 30 minutes before changing a bandage (dressing) can help relieve pain.  Take your antibiotics as directed. Finish them even if you start to feel better.  Gently wash the area with mild soap and water 2 times a day, or as directed.  Rinse off the soap. Pat the area dry with a clean towel. Do not rub the wound. This may cause bleeding.  Follow your health care provider's instructions for how often you need to change the dressing and packing inside. Wash your hands well before and after changing your dressing. Apply a dressing to the wound as directed.  Take showers. Do not soak the wound, bathe, swim, or use a hot tub until directed by your health care provider.  Avoid exercises that make you sweat heavily.  Use anti-itch medicine as directed by your health care provider. The wound may itch when it is healing. Do not pick or scratch at the wound.  Do not lift more than 10 pounds (4.5 kg) until the wound is healed, or as directed by your health care provider.  Keep all follow-up appointments as directed. SEEK MEDICAL CARE IF:  You have excessive bleeding from your surgical wound.  Your wound does not seem to be healing properly.  You have a fever. SEEK IMMEDIATE MEDICAL CARE IF:   You have increased swelling or redness around the wound.  You have increasing pain in the wound.  You have an increasing amount of pus coming from the wound.  Your wound breaks open farther. MAKE SURE YOU:   Understand these instructions.  Will watch your condition.  Will get help right away if you are not doing well or get worse.   This information is not intended to replace advice given to you by your health care provider. Make sure you discuss any questions you have with your health care  provider.   Document Released: 04/15/2003 Document Revised: 01/28/2013 Document Reviewed: 09/30/2012 Elsevier Interactive Patient Education [employer].

## 2015-08-03 NOTE — ED Notes (Signed)
Pt presents w/ open surgical site. Area about 2 cm has opened. Stitches were removed yesterday. Took a bath tonight & wound opened when pt was putting on her bra.

## 2015-08-03 NOTE — ED Notes (Signed)
Pt states she had skin cancer removed from left upper arm x 2 weeks ago and tonight the site opened up.

## 2015-08-03 NOTE — ED Provider Notes (Signed)
CSN: [id number]     Arrival date & time 08/03/15  2246 History  By signing my name below, I, Reola Mosher, attest that this documentation has been prepared under the direction and in the presence of Rolland Porter, MD at 23:03 PM.  Electronically Signed: Reola Mosher, ED Scribe. 08/03/2015. 11:08 PM.   Chief Complaint  Patient presents with  . Wound Check   The history is provided by the patient. No language interpreter was used.   HPI Comments: Jennifer [name] is a 45 y.o. female who presents to the Emergency Department fr a wound check s/p skin cancer removal x 2 weeks PTA. Pt reports that she had a skin cancer removal surgery about 2 weeks ago with no complications. Pt reports that she returned to her surgeon's 1 day PTA, and removed her stitches. Tonight around 9 pm, she got out of the tub and rubbed off the old Vit E and when she slid her bra on, she states that her wound came open, and leaked a bright orange fluid that didn't look like blood, but states that it has since stopped bleeding since coming into the ED. She states that the area is not painful. She currently smokes 3-4 cigarettes a day. She is not currently in work. She is not complaining of any other symptoms at this time.    PCP Dr Scotty Court Surgeon Dr Ladona Horns  Past Medical History  Diagnosis Date  . Seizures ([place])   . PTSD (post-traumatic stress disorder)   . GERD (gastroesophageal reflux disease)   . Panic attack   . Asthma   . Anxiety   . Schizophrenia ([place])   . Bipolar 1 disorder ([place])   . S/P endoscopy Feb 2012    Nl, s/p [facility] dilator, biopsy benign  . S/P endoscopy 2008    Mild gastritis  . History of flexible sigmoidoscopy 1999    Normal to 60cm,  . Diabetes mellitus without complication ([place])   . Ovarian cancer [facility])    Past Surgical History  Procedure Laterality Date  . Partial hysterectomy    . Cholecystectomy    . Tonsillectomy    . Hernia repair      Incisional with mesh  .  Colonoscopy  03/01/2011    Rourk-External hemorrhoids/otherwise normal rectum and colon.  . Esophagogastroduodenoscopy  2008    gastritis  . Esophagogastroduodenoscopy  2012    Moderate sized hiatal hernia, non-H. pylori gastritis  . Esophagogastroduodenoscopy N/A 11/17/2013    RMR: Mild erosive reflux esophagitis. Patulous EG junction   Family History  Problem Relation Age of Onset  . Crohn's disease Maternal Aunt   . Breast cancer Maternal Grandmother   . Colon cancer Maternal Grandfather     71  . Pancreatic cancer Maternal Grandfather   . Crohn's disease Cousin     x 2 cousins  . Breast cancer Daughter 30   Social History  Substance Use Topics  . Smoking status: Current Some Day Smoker -- 0.25 packs/day for 26 years    Types: Cigarettes    Start date: 05/03/1984  . Smokeless tobacco: Never Used     Comment: 2 or 3 cigarettes some days/ doesn't smoke everyday  . Alcohol Use: No     Comment: occa   Smokes 3-4 cigs a day Does not work  OB History    No data available     Review of Systems  Constitutional: Negative for fever.  Skin: Positive for wound.  All other systems  reviewed and are negative.  Allergies  Shellfish allergy; Codeine; Hydrocodone; Ivp dye; and Latex  Home Medications   Prior to Admission medications   Medication Sig Start Date End Date Taking? Authorizing Provider  busPIRone (BUSPAR) 10 MG tablet Take 10 mg by mouth 3 (three) times daily. 2 tabs, 3x/day   Yes Historical Provider, MD  cetirizine (ZYRTEC) 10 MG tablet Take 10 mg by mouth every morning.   Yes Historical Provider, MD  citalopram (CELEXA) 40 MG tablet Take 40 mg by mouth daily.   Yes Historical Provider, MD  dicyclomine (BENTYL) 20 MG tablet Take 20 mg by mouth every 6 (six) hours.   Yes Historical Provider, MD  fluticasone (FLONASE) 50 MCG/ACT nasal spray Place 1 spray into the nose 2 (two) times daily.    Yes Historical Provider, MD  gabapentin (NEURONTIN) 100 MG capsule Take 100  mg by mouth 3 (three) times daily. 06/18/15  Yes Historical Provider, MD  Levetiracetam 750 MG TB24 Take 750 mg by mouth at bedtime. 06/18/15  Yes Historical Provider, MD  Linaclotide Rolan Lipa) 145 MCG CAPS capsule Take 1 capsule (145 mcg total) by mouth daily. 12/07/14  Yes [name], PA-C  metoCLOPramide (REGLAN) 5 MG tablet Take 5 mg by mouth 4 (four) times daily -  before meals and at bedtime.   Yes Historical Provider, MD  nitroGLYCERIN (NITROSTAT) 0.4 MG SL tablet Place 0.4 mg under the tongue every 5 (five) minutes as needed for chest pain.   Yes Historical Provider, MD  ondansetron (ZOFRAN-ODT) 4 MG disintegrating tablet Take 4 mg by mouth every 8 (eight) hours as needed for nausea or vomiting.   Yes Historical Provider, MD  oxybutynin (DITROPAN) 5 MG tablet Take 5 mg by mouth 2 (two) times daily.   Yes Historical Provider, MD  pantoprazole (PROTONIX) 40 MG tablet TAKE (1) TABLET BY MOUTH ONCE DAILY. 05/04/15  Yes [name], PA-C  polyethylene glycol (MIRALAX / GLYCOLAX) packet Take 17 g by mouth daily.   Yes Historical Provider, MD  umeclidinium-vilanterol (ANORO ELLIPTA) 62.5-25 MCG/INH AEPB Inhale 1 puff into the lungs daily.   Yes Historical Provider, MD  Vitamin D, Ergocalciferol, (DRISDOL) 50000 units CAPS capsule Take 50,000 Units by mouth every 7 (seven) days.   Yes Historical Provider, MD  Multiple Vitamin (MULTIVITAMIN) tablet Take 1 tablet by mouth daily.    Historical Provider, MD   BP 127/86 mmHg  Pulse 98  Temp(Src) 98.5 F (36.9 C) (Oral)  Resp 18  Ht 5\' 6"  (1.676 m)  Wt 217 lb (98.431 kg)  BMI 35.04 kg/m2  SpO2 100%  Vital signs normal     Physical Exam  Constitutional: She is oriented to person, place, and time. She appears well-developed and well-nourished.  Non-toxic appearance. She does not appear ill. No distress.  HENT:  Head: Normocephalic and atraumatic.  Right Ear: External ear normal.  Left Ear: External ear normal.  Nose: Nose normal. No  mucosal edema or rhinorrhea.  Mouth/Throat: Mucous membranes are normal. No dental abscesses or uvula swelling.  Eyes: Conjunctivae and EOM are normal.  Neck: Normal range of motion and full passive range of motion without pain.  Pulmonary/Chest: Effort normal. No respiratory distress. She has no rhonchi. She exhibits no crepitus.  Abdominal: Normal appearance.  Musculoskeletal: Normal range of motion. She exhibits no edema or tenderness.  Moves all extremities well.   Neurological: She is alert and oriented to person, place, and time. She has normal strength. No cranial nerve deficit.  Skin: Skin is warm, dry and intact. No rash noted. No erythema. No pallor.  Incision at her left upper arm with 2cm dehiscence. Wound bed looks clean and not infected. No erythema and no signs of infection.   Psychiatric: She has a normal mood and affect. Her speech is normal and behavior is normal. Her mood appears not anxious.  Nursing note and vitals reviewed.      ED Course  Procedures (including critical care time)  DIAGNOSTIC STUDIES: Oxygen Saturation is 100% on RA, normal by my interpretation.   COORDINATION OF CARE: 11:07 PM-Discussed next steps with pt including f/u w/ her surgeon. Pt verbalized understanding and is agreeable with the plan.  We discussed it sounds like she had a seroma underneath the area. The wound will most likely need to have the edges debrided and resutured or let it heal in secondarily. This sound be decided by her surgeon.   MDM   Final diagnoses:  Wound dehiscence, initial encounter   Plan discharge  Rolland Porter, MD, FACEP   I personally performed the services described in this documentation, which was scribed in my presence. The recorded information has been reviewed and considered.  Rolland Porter, MD, Barbette Or, MD 08/03/15 [phone]

## 2015-08-03 NOTE — ED Notes (Signed)
Pt alert & oriented x4, stable gait. Patient given discharge instructions, paperwork & prescription(s). Patient  instructed to stop at the registration desk to finish any additional paperwork. Patient verbalized understanding. Pt left department w/ no further questions. 

## 2015-08-23 DIAGNOSIS — G40909 Epilepsy, unspecified, not intractable, without status epilepticus: Secondary | ICD-10-CM | POA: Diagnosis not present

## 2015-08-23 DIAGNOSIS — R269 Unspecified abnormalities of gait and mobility: Secondary | ICD-10-CM | POA: Diagnosis not present

## 2015-08-23 DIAGNOSIS — Z79899 Other long term (current) drug therapy: Secondary | ICD-10-CM | POA: Diagnosis not present

## 2015-08-26 DIAGNOSIS — H16223 Keratoconjunctivitis sicca, not specified as Sjogren's, bilateral: Secondary | ICD-10-CM | POA: Diagnosis not present

## 2015-09-06 DIAGNOSIS — F250 Schizoaffective disorder, bipolar type: Secondary | ICD-10-CM | POA: Diagnosis not present

## 2015-09-09 DIAGNOSIS — N951 Menopausal and female climacteric states: Secondary | ICD-10-CM | POA: Diagnosis not present

## 2015-09-09 DIAGNOSIS — Z6835 Body mass index (BMI) 35.0-35.9, adult: Secondary | ICD-10-CM | POA: Diagnosis not present

## 2015-09-09 DIAGNOSIS — Z01419 Encounter for gynecological examination (general) (routine) without abnormal findings: Secondary | ICD-10-CM | POA: Diagnosis not present

## 2015-09-14 ENCOUNTER — Ambulatory Visit (INDEPENDENT_AMBULATORY_CARE_PROVIDER_SITE_OTHER): Payer: BLUE CROSS/BLUE SHIELD | Admitting: Internal Medicine

## 2015-09-14 ENCOUNTER — Encounter: Payer: Self-pay | Admitting: Internal Medicine

## 2015-09-14 VITALS — BP 110/79 | HR 88 | Temp 99.0°F | Ht 66.0 in | Wt 219.8 lb

## 2015-09-14 DIAGNOSIS — K219 Gastro-esophageal reflux disease without esophagitis: Secondary | ICD-10-CM | POA: Diagnosis not present

## 2015-09-14 DIAGNOSIS — K5909 Other constipation: Secondary | ICD-10-CM | POA: Diagnosis not present

## 2015-09-14 NOTE — Patient Instructions (Signed)
Take Miralax 1 capful daily - every day; take a second dose daily as needed  Our goal is for you to have one BM every other day or at least 3 times weekly  Continue Nexium 40 mg daily  Office visit in 4 months  Information on weight reduction diet provided

## 2015-09-14 NOTE — Progress Notes (Signed)
Primary Care Physician:  Deloria Lair, MD Primary Gastroenterologist:  Dr. Gala Romney  Pre-Procedure History & Physical: HPI:  Jennifer [name] is a 45 y.o. female here for follow-up of GERD and constipation. Now on Nexium for GERD. Reflux symptoms well controlled on this regimen. No dysphagia or bleeding. Chronically constipated  -  rarely has urge to have a bowel movement. Gets abdominal "cramps" after about a week and a half without a bowel movement. This, she reports, is her signal to have a bowel movement. Only starts MiraLax after a week or so with no bowel movement. Hasn't had any rectal bleeding. Up-to-date with colonoscopy.  Wants help with weight loss. Denies any thyroid testing hasn't been done lately.   Past Medical History:  Diagnosis Date  . Anxiety   . Asthma   . Bipolar 1 disorder ([place])   . Diabetes mellitus without complication ([place])   . GERD (gastroesophageal reflux disease)   . History of flexible sigmoidoscopy 1999   Normal to 60cm,  . Ovarian cancer ([place])   . Panic attack   . PTSD (post-traumatic stress disorder)   . S/P endoscopy Feb 2012   Nl, s/p 56-[facility] dilator, biopsy benign  . S/P endoscopy 2008   Mild gastritis  . Schizophrenia (Jennifer [name])   . Seizures ([place])     Past Surgical History:  Procedure Laterality Date  . CHOLECYSTECTOMY    . COLONOSCOPY  03/01/2011   Jennifer [name]-External hemorrhoids/otherwise normal rectum and colon.  . ESOPHAGOGASTRODUODENOSCOPY  2008   gastritis  . ESOPHAGOGASTRODUODENOSCOPY  2012   Moderate sized hiatal hernia, non-H. pylori gastritis  . ESOPHAGOGASTRODUODENOSCOPY N/A 11/17/2013   RMR: Mild erosive reflux esophagitis. Patulous EG junction  . HERNIA REPAIR     Incisional with mesh  . PARTIAL HYSTERECTOMY    . TONSILLECTOMY      Prior to Admission medications   Medication Sig Start Date End Date Taking? Authorizing Provider  busPIRone (BUSPAR) 10 MG tablet Take 10 mg by mouth 3 (three) times daily. 2 tabs, 3x/day    Yes Historical Provider, MD  cetirizine (ZYRTEC) 10 MG tablet Take 10 mg by mouth every morning.   Yes Historical Provider, MD  citalopram (CELEXA) 40 MG tablet Take 40 mg by mouth daily.   Yes Historical Provider, MD  estradiol (ESTRACE) 1 MG tablet Take 1 mg by mouth daily.   Yes Historical Provider, MD  fluticasone (FLONASE) 50 MCG/ACT nasal spray Place 1 spray into the nose 2 (two) times daily.    Yes Historical Provider, MD  gabapentin (NEURONTIN) 100 MG capsule Take 100 mg by mouth 3 (three) times daily. 06/18/15  Yes Historical Provider, MD  Levetiracetam 750 MG TB24 Take 750 mg by mouth at bedtime. 06/18/15  Yes Historical Provider, MD  Linaclotide Rolan Lipa) 145 MCG CAPS capsule Take 1 capsule (145 mcg total) by mouth daily. 12/07/14  Yes [name], PA-C  metoCLOPramide (REGLAN) 5 MG tablet Take 5 mg by mouth 4 (four) times daily -  before meals and at bedtime.   Yes Historical Provider, MD  NON FORMULARY Sarna lotion    As directed   Yes Historical Provider, MD  oxybutynin (DITROPAN) 5 MG tablet Take 5 mg by mouth 2 (two) times daily.   Yes Historical Provider, MD  pantoprazole (PROTONIX) 40 MG tablet TAKE (1) TABLET BY MOUTH ONCE DAILY. 05/04/15  Yes [name], PA-C  polyethylene glycol (MIRALAX / GLYCOLAX) packet Take 17 g by mouth daily.   Yes Historical Provider,  MD  umeclidinium-vilanterol (ANORO ELLIPTA) 62.5-25 MCG/INH AEPB Inhale 1 puff into the lungs daily.   Yes Historical Provider, MD  Vitamin D, Ergocalciferol, (DRISDOL) 50000 units CAPS capsule Take 50,000 Units by mouth every 7 (seven) days.   Yes Historical Provider, MD  dicyclomine (BENTYL) 20 MG tablet Take 20 mg by mouth every 6 (six) hours.    Historical Provider, MD  Multiple Vitamin (MULTIVITAMIN) tablet Take 1 tablet by mouth daily.    Historical Provider, MD  nitroGLYCERIN (NITROSTAT) 0.4 MG SL tablet Place 0.4 mg under the tongue every 5 (five) minutes as needed for chest pain.    Historical Provider, MD    ondansetron (ZOFRAN-ODT) 4 MG disintegrating tablet Take 4 mg by mouth every 8 (eight) hours as needed for nausea or vomiting.    Historical Provider, MD    Allergies as of 09/14/2015 - Review Complete 09/14/2015  Allergen Reaction Noted  . Shellfish allergy Swelling 10/29/2013  . Codeine Other (See Comments)   . Hydrocodone  12/03/2012  . Ivp dye [iodinated diagnostic agents] Nausea And Vomiting 10/17/2010  . Latex Rash 03/08/2010    Family History  Problem Relation Age of Onset  . Crohn's disease Maternal Aunt   . Breast cancer Maternal Grandmother   . Colon cancer Maternal Grandfather     86  . Pancreatic cancer Maternal Grandfather   . Crohn's disease Cousin     x 2 cousins  . Breast cancer Daughter 68    Social History   Social History  . Marital status: Married    Spouse name: N/A  . Number of children: N/A  . Years of education: N/A   Occupational History  . disabled Not Employed   Social History Main Topics  . Smoking status: Current Some Day Smoker    Packs/day: 0.25    Years: 26.00    Types: Cigarettes    Start date: 05/03/1984  . Smokeless tobacco: Never Used     Comment: 2 or 3 cigarettes some days/ doesn't smoke everyday  . Alcohol use No     Comment: occa  . Drug use: No  . Sexual activity: Yes    Birth control/ protection: Surgical   Other Topics Concern  . Not on file   Social History Narrative  . No narrative on file    Review of Systems: See HPI, otherwise negative ROS  Physical Exam: BP 110/79   Pulse 88   Temp 99 F (37.2 C) (Oral)   Ht 5\' 6"  (1.676 m)   Wt 219 lb 12.8 oz (99.7 kg)   BMI 35.48 kg/m  General:   Alert,  Well-developed, well-nourished, pleasant and cooperative in NAD   Impression:  Pleasant 45 year old lady with long-standing GERD well-controlled on Nexium 40 mg daily. Constipation likely more slow transit/colonic inertia rather than functional outlet obstruction. Likely multifactorial in origin.  She is under  utilizing MiraLax.  We discussed what is defined as normal  -  that being one bowel movement every other day or 3 bowel movements a week would is our goal.  Recommendations:  Take Miralax 1 capful daily - every day; take a second dose daily as needed  Our goal is for you to have one BM every other day or at least 3 times weekly  Continue Nexium 40 mg daily  Office visit in 4 months  Information on weight reduction diet provided   Notice: This dictation was prepared with Dragon dictation along with smaller phrase technology. Any transcriptional errors that  result from this process are unintentional and may not be corrected upon review.

## 2015-10-27 DIAGNOSIS — D2262 Melanocytic nevi of left upper limb, including shoulder: Secondary | ICD-10-CM | POA: Diagnosis not present

## 2015-10-27 DIAGNOSIS — T8131XA Disruption of external operation (surgical) wound, not elsewhere classified, initial encounter: Secondary | ICD-10-CM | POA: Diagnosis not present

## 2015-10-27 DIAGNOSIS — D1722 Benign lipomatous neoplasm of skin and subcutaneous tissue of left arm: Secondary | ICD-10-CM | POA: Diagnosis not present

## 2015-11-09 DIAGNOSIS — Z6835 Body mass index (BMI) 35.0-35.9, adult: Secondary | ICD-10-CM | POA: Diagnosis not present

## 2015-11-09 DIAGNOSIS — R102 Pelvic and perineal pain: Secondary | ICD-10-CM | POA: Diagnosis not present

## 2015-11-18 ENCOUNTER — Other Ambulatory Visit: Payer: Self-pay | Admitting: Gastroenterology

## 2015-11-22 DIAGNOSIS — M542 Cervicalgia: Secondary | ICD-10-CM | POA: Diagnosis not present

## 2015-11-22 DIAGNOSIS — Z79899 Other long term (current) drug therapy: Secondary | ICD-10-CM | POA: Diagnosis not present

## 2015-11-22 DIAGNOSIS — G40909 Epilepsy, unspecified, not intractable, without status epilepticus: Secondary | ICD-10-CM | POA: Diagnosis not present

## 2015-11-22 DIAGNOSIS — G43C1 Periodic headache syndromes in child or adult, intractable: Secondary | ICD-10-CM | POA: Diagnosis not present

## 2015-11-25 DIAGNOSIS — Z0000 Encounter for general adult medical examination without abnormal findings: Secondary | ICD-10-CM | POA: Diagnosis not present

## 2015-12-07 DIAGNOSIS — F250 Schizoaffective disorder, bipolar type: Secondary | ICD-10-CM | POA: Diagnosis not present

## 2015-12-08 DIAGNOSIS — R102 Pelvic and perineal pain: Secondary | ICD-10-CM | POA: Diagnosis not present

## 2015-12-08 DIAGNOSIS — Z6836 Body mass index (BMI) 36.0-36.9, adult: Secondary | ICD-10-CM | POA: Diagnosis not present

## 2015-12-09 DIAGNOSIS — M25519 Pain in unspecified shoulder: Secondary | ICD-10-CM | POA: Diagnosis not present

## 2015-12-09 DIAGNOSIS — Z79899 Other long term (current) drug therapy: Secondary | ICD-10-CM | POA: Diagnosis not present

## 2015-12-09 DIAGNOSIS — M542 Cervicalgia: Secondary | ICD-10-CM | POA: Diagnosis not present

## 2016-02-09 DIAGNOSIS — H16223 Keratoconjunctivitis sicca, not specified as Sjogren's, bilateral: Secondary | ICD-10-CM | POA: Diagnosis not present

## 2016-02-21 DIAGNOSIS — G894 Chronic pain syndrome: Secondary | ICD-10-CM | POA: Diagnosis not present

## 2016-02-21 DIAGNOSIS — Z79899 Other long term (current) drug therapy: Secondary | ICD-10-CM | POA: Diagnosis not present

## 2016-02-21 DIAGNOSIS — G4701 Insomnia due to medical condition: Secondary | ICD-10-CM | POA: Diagnosis not present

## 2016-02-21 DIAGNOSIS — G40909 Epilepsy, unspecified, not intractable, without status epilepticus: Secondary | ICD-10-CM | POA: Diagnosis not present

## 2016-02-29 DIAGNOSIS — F329 Major depressive disorder, single episode, unspecified: Secondary | ICD-10-CM | POA: Diagnosis not present

## 2016-05-23 ENCOUNTER — Other Ambulatory Visit: Payer: Self-pay | Admitting: Gastroenterology

## 2016-05-23 ENCOUNTER — Other Ambulatory Visit: Payer: Self-pay | Admitting: Nurse Practitioner

## 2016-07-06 ENCOUNTER — Telehealth: Payer: Self-pay

## 2016-07-06 NOTE — Telephone Encounter (Signed)
Can try MiraLAX 17 g in 8 ounces of water daily as needed

## 2016-07-06 NOTE — Telephone Encounter (Signed)
Pt is calling because her insurance will not pay for the Linzess anymore. She is wanting to know if we can send in some kind of liquid to help with her bowels to [facility].

## 2016-07-06 NOTE — Telephone Encounter (Signed)
Pt is aware.  

## 2016-07-11 DIAGNOSIS — F329 Major depressive disorder, single episode, unspecified: Secondary | ICD-10-CM | POA: Diagnosis not present

## 2016-08-20 ENCOUNTER — Emergency Department (HOSPITAL_COMMUNITY)
Admission: EM | Admit: 2016-08-20 | Discharge: 2016-08-20 | Disposition: A | Discharge status: Home / Self Care | Payer: Self-pay | Attending: Emergency Medicine | Admitting: Emergency Medicine

## 2016-08-20 ENCOUNTER — Emergency Department (HOSPITAL_COMMUNITY): Payer: Self-pay

## 2016-08-20 ENCOUNTER — Encounter (HOSPITAL_COMMUNITY): Payer: Self-pay | Admitting: *Deleted

## 2016-08-20 DIAGNOSIS — F17210 Nicotine dependence, cigarettes, uncomplicated: Secondary | ICD-10-CM | POA: Insufficient documentation

## 2016-08-20 DIAGNOSIS — E119 Type 2 diabetes mellitus without complications: Secondary | ICD-10-CM | POA: Insufficient documentation

## 2016-08-20 DIAGNOSIS — Y9301 Activity, walking, marching and hiking: Secondary | ICD-10-CM | POA: Insufficient documentation

## 2016-08-20 DIAGNOSIS — S93401A Sprain of unspecified ligament of right ankle, initial encounter: Secondary | ICD-10-CM | POA: Insufficient documentation

## 2016-08-20 DIAGNOSIS — Z91040 Latex allergy status: Secondary | ICD-10-CM | POA: Insufficient documentation

## 2016-08-20 DIAGNOSIS — S8002XA Contusion of left knee, initial encounter: Secondary | ICD-10-CM | POA: Insufficient documentation

## 2016-08-20 DIAGNOSIS — Z79899 Other long term (current) drug therapy: Secondary | ICD-10-CM | POA: Insufficient documentation

## 2016-08-20 DIAGNOSIS — W010XXA Fall on same level from slipping, tripping and stumbling without subsequent striking against object, initial encounter: Secondary | ICD-10-CM | POA: Insufficient documentation

## 2016-08-20 DIAGNOSIS — W19XXXA Unspecified fall, initial encounter: Secondary | ICD-10-CM

## 2016-08-20 DIAGNOSIS — Y998 Other external cause status: Secondary | ICD-10-CM | POA: Insufficient documentation

## 2016-08-20 DIAGNOSIS — Y929 Unspecified place or not applicable: Secondary | ICD-10-CM | POA: Insufficient documentation

## 2016-08-20 DIAGNOSIS — J45909 Unspecified asthma, uncomplicated: Secondary | ICD-10-CM | POA: Insufficient documentation

## 2016-08-20 MED ORDER — DIPHENHYDRAMINE HCL 12.5 MG/5ML PO ELIX
12.5000 mg | ORAL_SOLUTION | Freq: Once | ORAL | Status: AC
Start: 2016-08-20 — End: 2016-08-20
  Administered 2016-08-20: 12.5 mg via ORAL
  Filled 2016-08-20: qty 5

## 2016-08-20 MED ORDER — ONDANSETRON HCL 4 MG PO TABS
4.0000 mg | ORAL_TABLET | Freq: Once | ORAL | Status: AC
  Administered 2016-08-20: 4 mg via ORAL
  Filled 2016-08-20: qty 1

## 2016-08-20 MED ORDER — IBUPROFEN 600 MG PO TABS: 600.0000 mg | ORAL_TABLET | Freq: Four times a day (QID) | ORAL | 0 refills | Status: DC

## 2016-08-20 MED ORDER — HYDROCODONE-ACETAMINOPHEN 5-325 MG PO TABS
2.0000 | ORAL_TABLET | Freq: Once | ORAL | Status: AC
  Administered 2016-08-20: 2 via ORAL
  Filled 2016-08-20: qty 2

## 2016-08-20 MED ORDER — TRAMADOL HCL 50 MG PO TABS: 50.0000 mg | ORAL_TABLET | Freq: Four times a day (QID) | ORAL | 0 refills | Status: DC | PRN

## 2016-08-20 NOTE — ED Notes (Signed)
To radiology

## 2016-08-20 NOTE — Discharge Instructions (Signed)
Your vital signs within normal limits. The x-ray of your knee is negative for fracture, dislocation, or fluid in the joint. The x-ray of your ankle is negative for fracture or dislocation. Your examination favors a strain/sprain of your ankle. Please use the ankle splint when up and about over the next 7-10 days. Use the crutches until you can safely apply weight to your right foot and ankle. Please see Dr. Scotty Court in the office for follow-up following your fall. Please return to the emergency department if any emergent changes, problems, or concerns. Please keep your ankle elevated above your waist. Apply ice.

## 2016-08-20 NOTE — ED Triage Notes (Signed)
Pt reports stepping off the porch onto a cinder block and heard a pop in her right ankle. Pt has swelling to the right ankle.

## 2016-08-20 NOTE — ED Notes (Signed)
From rad 

## 2016-08-28 NOTE — ED Provider Notes (Signed)
[facility] DEPT Provider Note   CSN: [id number] Arrival date & time: 08/20/16  1839     History   Chief Complaint Chief Complaint  Patient presents with  . Ankle Pain    HPI Jennifer CHANDRAN is a 46 y.o. female.   Ankle Pain   The incident occurred at home. Injury mechanism: twisted the right ankle and heart a pop. The pain is present in the right ankle. The quality of the pain is described as sharp and throbbing. The pain is moderate. The pain has been constant since onset. Associated symptoms include inability to bear weight. Pertinent negatives include no loss of sensation. She reports no foreign bodies present. She has tried ice for the symptoms. The treatment provided no relief.    Past Medical History:  Diagnosis Date  . Anxiety   . Asthma   . Bipolar 1 disorder ([place])   . Diabetes mellitus without complication ([place])   . GERD (gastroesophageal reflux disease)   . History of flexible sigmoidoscopy 1999   Normal to 60cm,  . Ovarian cancer ([place])   . Panic attack   . PTSD (post-traumatic stress disorder)   . S/P endoscopy Feb 2012   Nl, s/p 56-[facility] dilator, biopsy benign  . S/P endoscopy 2008   Mild gastritis  . Schizophrenia ([place])   . Seizures [facility])     Patient Active Problem List   Diagnosis Date Noted  . Lower abdominal pain 12/14/2014  . Nausea without vomiting 12/07/2014  . Dysuria 01/19/2014  . Upper abdominal pain 10/29/2013  . Abnormal weight loss 10/29/2013  . Hiatal hernia 08/20/2012  . Obesity 11/22/2011  . Atypical chest pain 09/21/2011  . HTN (hypertension) 09/21/2011  . Palpitations 09/21/2011  . Chondromalacia of left patella 05/23/2011  . Patellofemoral pain syndrome 04/24/2011  . Hematochezia 02/22/2011  . Knee pain 12/28/2010  . Muscle weakness (generalized) 12/28/2010  . Dysphagia 05/19/2010  . Vaginal discharge 05/19/2010  . Allergy to latex 03/08/2010  . PSYCHIATRIC DISORDER 03/10/2009  . ANEMIA 03/09/2009  .  GASTROESOPHAGEAL REFLUX DISEASE, CHRONIC 03/09/2009  . Constipation 03/09/2009  . SEIZURE DISORDER 03/09/2009  . INSOMNIA 03/09/2009  . NAUSEA 03/09/2009  . Abdominal pain 03/09/2009  . DERANGEMENT MENISCUS 11/11/2008  . H N P-LUMBAR 11/11/2008  . SCIATICA 11/11/2008    Past Surgical History:  Procedure Laterality Date  . CHOLECYSTECTOMY    . COLONOSCOPY  03/01/2011   Rourk-External hemorrhoids/otherwise normal rectum and colon.  . ESOPHAGOGASTRODUODENOSCOPY  2008   gastritis  . ESOPHAGOGASTRODUODENOSCOPY  2012   Moderate sized hiatal hernia, non-H. pylori gastritis  . ESOPHAGOGASTRODUODENOSCOPY N/A 11/17/2013   RMR: Mild erosive reflux esophagitis. Patulous EG junction  . HERNIA REPAIR     Incisional with mesh  . PARTIAL HYSTERECTOMY    . TONSILLECTOMY      OB History    No data available       Home Medications    Prior to Admission medications   Medication Sig Start Date End Date Taking? Authorizing Provider  busPIRone (BUSPAR) 10 MG tablet Take 10 mg by mouth 3 (three) times daily. 2 tabs, 3x/day    [provider]  cetirizine (ZYRTEC) 10 MG tablet Take 10 mg by mouth every morning.    [provider]  citalopram (CELEXA) 40 MG tablet Take 40 mg by mouth daily.    [provider]  dicyclomine (BENTYL) 20 MG tablet Take 20 mg by mouth every 6 (six) hours.    [provider]  estradiol (ESTRACE) 1 MG tablet Take 1 mg by mouth daily.    [provider]  fluticasone (FLONASE) 50 MCG/ACT nasal spray Place 1 spray into the nose 2 (two) times daily.     [provider]  gabapentin (NEURONTIN) 100 MG capsule Take 100 mg by mouth 3 (three) times daily. 06/18/15   [provider]  ibuprofen (ADVIL,MOTRIN) 600 MG tablet Take 1 tablet (600 mg total) by mouth 4 (four) times daily. 08/20/16   Lily Kocher, PA-C  Levetiracetam 750 MG TB24 Take 750 mg by mouth at bedtime. 06/18/15   [provider]  LINZESS 145  MCG CAPS capsule TAKE 1 CAPSULE BY MOUTH ONCE A DAY. 05/24/16   Carlis Stable, NP  metoCLOPramide (REGLAN) 5 MG tablet Take 5 mg by mouth 4 (four) times daily -  before meals and at bedtime.    [provider]  Multiple Vitamin (MULTIVITAMIN) tablet Take 1 tablet by mouth daily.    [provider]  nitroGLYCERIN (NITROSTAT) 0.4 MG SL tablet Place 0.4 mg under the tongue every 5 (five) minutes as needed for chest pain.    [provider]  NON FORMULARY Sarna lotion    As directed    [provider]  ondansetron (ZOFRAN-ODT) 4 MG disintegrating tablet Take 4 mg by mouth every 8 (eight) hours as needed for nausea or vomiting.    [provider]  oxybutynin (DITROPAN) 5 MG tablet Take 5 mg by mouth 2 (two) times daily.    [provider]  pantoprazole (PROTONIX) 40 MG tablet TAKE (1) TABLET BY MOUTH ONCE DAILY. 05/24/16   Carlis Stable, NP  polyethylene glycol (MIRALAX / GLYCOLAX) packet Take 17 g by mouth daily.    [provider]  traMADol (ULTRAM) 50 MG tablet Take 1 tablet (50 mg total) by mouth every 6 (six) hours as needed. 08/20/16   Lily Kocher, PA-C  umeclidinium-vilanterol (ANORO ELLIPTA) 62.5-25 MCG/INH AEPB Inhale 1 puff into the lungs daily.    [provider]  Vitamin D, Ergocalciferol, (DRISDOL) 50000 units CAPS capsule Take 50,000 Units by mouth every 7 (seven) days.    [provider]    Family History Family History  Problem Relation Age of Onset  . Crohn's disease Maternal Aunt   . Breast cancer Maternal Grandmother   . Colon cancer Maternal Grandfather        71  . Pancreatic cancer Maternal Grandfather   . Crohn's disease Cousin        x 2 cousins  . Breast cancer Daughter 57    Social History Social History  Substance Use Topics  . Smoking status: Current Some Day Smoker    Packs/day: 0.25    Years: 26.00    Types: Cigarettes    Start date: 05/03/1984  . Smokeless tobacco: Never Used      Comment: 2 or 3 cigarettes some days/ doesn't smoke everyday  . Alcohol use No     Comment: occa     Allergies   Shellfish allergy; Codeine; Hydrocodone; Ivp dye [iodinated diagnostic agents]; and Latex   Review of Systems Review of Systems  Constitutional: Negative for activity change.       All ROS Neg except as noted in HPI  HENT: Negative for nosebleeds.   Eyes: Negative for photophobia and discharge.  Respiratory: Negative for cough, shortness of breath and wheezing.   Cardiovascular: Negative for chest pain and palpitations.  Gastrointestinal: Negative for abdominal pain and blood  in stool.  Genitourinary: Negative for dysuria, frequency and hematuria.  Musculoskeletal: Positive for arthralgias. Negative for back pain and neck pain.  Skin: Negative.   Neurological: Negative for dizziness, seizures and speech difficulty.  Psychiatric/Behavioral: Negative for confusion and hallucinations.     Physical Exam Updated Vital Signs BP 113/78 (BP Location: Right Arm)   Pulse 60   Temp 98.4 F (36.9 C) (Oral)   Resp 16   Ht 5\' 6"  (1.676 m)   Wt 104.3 kg (230 lb)   SpO2 99%   BMI 37.12 kg/m   Physical Exam  Constitutional: She is oriented to person, place, and time. She appears well-developed and well-nourished.  Non-toxic appearance.  HENT:  Head: Normocephalic.  Right Ear: Tympanic membrane and external ear normal.  Left Ear: Tympanic membrane and external ear normal.  Eyes: Pupils are equal, round, and reactive to light. EOM and lids are normal.  Neck: Normal range of motion. Neck supple. Carotid bruit is not present.  Cardiovascular: Normal rate, regular rhythm, normal heart sounds, intact distal pulses and normal pulses.   Pulmonary/Chest: Breath sounds normal. No respiratory distress.  Abdominal: Soft. Bowel sounds are normal. There is no tenderness. There is no guarding.  Musculoskeletal: Normal range of motion.       Right knee: Normal.       Left knee:  Tenderness found. Patellar tendon tenderness noted.       Right ankle: Tenderness. Lateral malleolus tenderness found. Achilles tendon normal.  Lymphadenopathy:       Head (right side): No submandibular adenopathy present.       Head (left side): No submandibular adenopathy present.    She has no cervical adenopathy.  Neurological: She is alert and oriented to person, place, and time. She has normal strength. No cranial nerve deficit or sensory deficit.  Skin: Skin is warm and dry.  Psychiatric: She has a normal mood and affect. Her speech is normal.  Nursing note and vitals reviewed.    ED Treatments / Results  Labs (all labs ordered are listed, but only abnormal results are displayed) Labs Reviewed - No data to display  EKG  EKG Interpretation None       Radiology No results found.  Procedures Procedures (including critical care time)  Medications Ordered in ED Medications  HYDROcodone-acetaminophen (NORCO/VICODIN) 5-325 MG per tablet 2 tablet (2 tablets Oral Given 08/20/16 2023)  diphenhydrAMINE (BENADRYL) 12.5 MG/5ML elixir 12.5 mg (12.5 mg Oral Given 08/20/16 2024)  ondansetron (ZOFRAN) tablet 4 mg (4 mg Oral Given 08/20/16 2024)     Initial Impression / Assessment and Plan / ED Course  I have reviewed the triage vital signs and the nursing notes.  Pertinent labs & imaging results that were available during my care of the patient were reviewed by me and considered in my medical decision making (see chart for details).       Final Clinical Impressions(s) / ED Diagnoses MDM I have reviewed the x-rays of the left knee in the right ankle on. No fracture or dislocation appreciated. There is some soft tissue swelling of the lateral malleolus.  Patient does not have findings of gross neurovascular deficits.  Suspect an ankle sprain. Patient fitted with an ankle stirrup splint. Will use ice and elevation. Will offer crutches if needed. Patient will follow-up with  orthopedics for additional evaluation if not improving.    Final diagnoses:  Sprain of right ankle, unspecified ligament, initial encounter  Contusion of left knee, initial encounter  New Prescriptions Discharge Medication List as of 08/20/2016  9:21 PM    START taking these medications   Details  ibuprofen (ADVIL,MOTRIN) 600 MG tablet Take 1 tablet (600 mg total) by mouth 4 (four) times daily., Starting Sun 08/20/2016, Print    traMADol (ULTRAM) 50 MG tablet Take 1 tablet (50 mg total) by mouth every 6 (six) hours as needed., Starting Sun 08/20/2016, Print         Lily Kocher, PA-C 08/28/16 [place], [place], DO 08/28/16 1819

## 2016-09-18 DIAGNOSIS — G4701 Insomnia due to medical condition: Secondary | ICD-10-CM | POA: Diagnosis not present

## 2016-09-18 DIAGNOSIS — G40909 Epilepsy, unspecified, not intractable, without status epilepticus: Secondary | ICD-10-CM | POA: Diagnosis not present

## 2016-09-18 DIAGNOSIS — G894 Chronic pain syndrome: Secondary | ICD-10-CM | POA: Diagnosis not present

## 2016-09-18 DIAGNOSIS — Z79899 Other long term (current) drug therapy: Secondary | ICD-10-CM | POA: Diagnosis not present

## 2016-09-26 DIAGNOSIS — H0014 Chalazion left upper eyelid: Secondary | ICD-10-CM | POA: Diagnosis not present

## 2016-09-28 DIAGNOSIS — R269 Unspecified abnormalities of gait and mobility: Secondary | ICD-10-CM | POA: Diagnosis not present

## 2016-09-28 DIAGNOSIS — Z79899 Other long term (current) drug therapy: Secondary | ICD-10-CM | POA: Diagnosis not present

## 2016-09-28 DIAGNOSIS — G40909 Epilepsy, unspecified, not intractable, without status epilepticus: Secondary | ICD-10-CM | POA: Diagnosis not present

## 2016-10-12 ENCOUNTER — Encounter: Payer: Self-pay | Admitting: Family Medicine

## 2016-10-12 ENCOUNTER — Ambulatory Visit (INDEPENDENT_AMBULATORY_CARE_PROVIDER_SITE_OTHER): Payer: BLUE CROSS/BLUE SHIELD | Admitting: Family Medicine

## 2016-10-12 VITALS — BP 124/74 | HR 72 | Temp 98.5°F | Resp 18 | Ht 66.0 in | Wt 233.1 lb

## 2016-10-12 DIAGNOSIS — D649 Anemia, unspecified: Secondary | ICD-10-CM | POA: Diagnosis not present

## 2016-10-12 DIAGNOSIS — N393 Stress incontinence (female) (male): Secondary | ICD-10-CM | POA: Diagnosis not present

## 2016-10-12 DIAGNOSIS — F99 Mental disorder, not otherwise specified: Secondary | ICD-10-CM | POA: Diagnosis not present

## 2016-10-12 DIAGNOSIS — Z1239 Encounter for other screening for malignant neoplasm of breast: Secondary | ICD-10-CM

## 2016-10-12 DIAGNOSIS — Z720 Tobacco use: Secondary | ICD-10-CM | POA: Diagnosis not present

## 2016-10-12 DIAGNOSIS — Z23 Encounter for immunization: Secondary | ICD-10-CM

## 2016-10-12 DIAGNOSIS — J4520 Mild intermittent asthma, uncomplicated: Secondary | ICD-10-CM | POA: Insufficient documentation

## 2016-10-12 DIAGNOSIS — E119 Type 2 diabetes mellitus without complications: Secondary | ICD-10-CM

## 2016-10-12 DIAGNOSIS — I10 Essential (primary) hypertension: Secondary | ICD-10-CM

## 2016-10-12 DIAGNOSIS — Z1231 Encounter for screening mammogram for malignant neoplasm of breast: Secondary | ICD-10-CM

## 2016-10-12 DIAGNOSIS — Z780 Asymptomatic menopausal state: Secondary | ICD-10-CM | POA: Diagnosis not present

## 2016-10-12 NOTE — Patient Instructions (Signed)
Need records Dr Scotty Court Lab work today Mammogram and bone density tests ordered See me in a month for a PE

## 2016-10-12 NOTE — Progress Notes (Signed)
Chief Complaint  Patient presents with  . Hypertension  here for fists visit Diet controlled diabetic No labs in a year Well controlled BP, sometimes Is low Chronic mental illness - uncertain as patient is unable to verbalize.  Says she's not bipolar or schizophrenic, but these are in chart.  Is on celexa and cymbalta and buspar.  I asked her to confirm with her psychiatrist, Dr Hoyle Barr if this is correct She sees Dr Merlene Laughter for seizure disorder Has hiatal hernia and GERD and sees Dr Sydell Axon. States she has arthritis that prevents her from exercise Is worried about incontinence.  Hysterectomy in her 40s for unclear reasons.  Poor historian. "skin cancer" removed from arm.  unknown type. Says she has anemia from sickle trait Poor historian with vague and incorrect answers at times.     Patient Active Problem List   Diagnosis Date Noted  . Tobacco abuse 10/12/2016  . Asthma in adult, mild intermittent, uncomplicated 84/13/2440  . Controlled type 2 diabetes mellitus without complication, without long-term current use of insulin ([place]) 10/12/2016  . SUI (stress urinary incontinence, female) 10/12/2016  . Chronic mental illness 10/12/2016  . Hiatal hernia 08/20/2012  . Obesity 11/22/2011  . HTN (hypertension) 09/21/2011  . Chondromalacia of left patella 05/23/2011  . Allergy to latex 03/08/2010  . ANEMIA 03/09/2009  . GASTROESOPHAGEAL REFLUX DISEASE, CHRONIC 03/09/2009  . Constipation 03/09/2009  . SEIZURE DISORDER 03/09/2009  . SCIATICA 11/11/2008    Outpatient Encounter Prescriptions as of 10/12/2016  Medication Sig  . busPIRone (BUSPAR) 10 MG tablet Take 10 mg by mouth 3 (three) times daily. 2 tabs, 3x/day  . cetirizine (ZYRTEC) 10 MG tablet Take 10 mg by mouth every morning.  . citalopram (CELEXA) 40 MG tablet Take 20 mg by mouth daily.   . DULoxetine (CYMBALTA) 30 MG capsule Take 30 mg by mouth daily.  Marland Kitchen gabapentin (NEURONTIN) 100 MG capsule Take 100 mg by mouth 3 (three) times  daily.  . Levetiracetam 750 MG TB24 Take 750 mg by mouth at bedtime.  . methocarbamol (ROBAXIN) 500 MG tablet Takes prn  . metoCLOPramide (REGLAN) 5 MG tablet Take 5 mg by mouth 4 (four) times daily -  before meals and at bedtime.  Marland Kitchen oxybutynin (DITROPAN) 5 MG tablet Take 5 mg by mouth 2 (two) times daily.  . pantoprazole (PROTONIX) 40 MG tablet TAKE (1) TABLET BY MOUTH ONCE DAILY.  Marland Kitchen Vitamin D, Ergocalciferol, (DRISDOL) 50000 units CAPS capsule Take 50,000 Units by mouth every 7 (seven) days.   No facility-administered encounter medications on file as of 10/12/2016.     Past Medical History:  Diagnosis Date  . Allergy   . Anemia   . Anxiety   . Arthritis    neck  . Asthma   . Bipolar 1 disorder ([place])   . Depression   . Diabetes mellitus without complication ([place])   . GERD (gastroesophageal reflux disease)   . History of flexible sigmoidoscopy 1999   Normal to 60cm,  . Hypertension   . Neuromuscular disorder ([place])   . Ovarian cancer ([place])    skin cancer  . Panic attack   . PTSD (post-traumatic stress disorder)   . S/P endoscopy Feb 2012   Nl, s/p [facility] dilator, biopsy benign  . S/P endoscopy 2008   Mild gastritis  . Schizophrenia ([place])   . Seizures ([place])   . Substance abuse    drug addiction    Past Surgical History:  Procedure Laterality Date  .  ABDOMINAL HYSTERECTOMY     ovarian tumors  . CHOLECYSTECTOMY    . COLONOSCOPY  03/01/2011   Rourk-External hemorrhoids/otherwise normal rectum and colon.  . ESOPHAGOGASTRODUODENOSCOPY  2008   gastritis  . ESOPHAGOGASTRODUODENOSCOPY  2012   Moderate sized hiatal hernia, non-H. pylori gastritis  . ESOPHAGOGASTRODUODENOSCOPY N/A 11/17/2013   RMR: Mild erosive reflux esophagitis. Patulous EG junction  . HERNIA REPAIR     Incisional with mesh  . PARTIAL HYSTERECTOMY    . SKIN CANCER EXCISION     left bicep  . TONSILLECTOMY      Social History   Social History  . Marital status: Married    Spouse name: N/A   . Number of children: N/A  . Years of education: N/A   Occupational History  . disabled Not Employed   Social History Main Topics  . Smoking status: Current Some Day Smoker    Packs/day: 0.25    Years: 26.00    Types: Cigarettes    Start date: 05/03/1984  . Smokeless tobacco: Never Used     Comment: 2 or 3 cigarettes some days/ doesn't smoke everyday  . Alcohol use No     Comment: occa  . Drug use: No  . Sexual activity: Yes    Birth control/ protection: Surgical   Other Topics Concern  . Not on file   Social History Narrative  . No narrative on file    Family History  Problem Relation Age of Onset  . Crohn's disease Maternal Aunt   . Breast cancer Maternal Grandmother   . Cancer Maternal Grandmother   . Colon cancer Maternal Grandfather        65  . Pancreatic cancer Maternal Grandfather   . Cancer Maternal Grandfather   . Crohn's disease Cousin        x 2 cousins  . Breast cancer Daughter 44  . Arthritis Mother   . COPD Mother   . Depression Mother   . Drug abuse Mother   . Heart disease Mother   . Hypertension Mother   . Hyperlipidemia Mother   . Learning disabilities Mother   . Mental illness Mother   . Early death Father 47       murder  . Alcohol abuse Father   . Drug abuse Father   . Learning disabilities Father   . Cancer Paternal Grandmother   . Cancer Paternal Grandfather     Review of Systems  Constitutional: Positive for malaise/fatigue. Negative for chills and fever.  HENT: Negative for congestion and hearing loss.   Eyes: Negative for discharge and redness.  Respiratory: Negative for cough and wheezing.        No inhalers in 2+ years  Cardiovascular: Negative for chest pain, palpitations and leg swelling.  Gastrointestinal: Positive for heartburn and nausea. Negative for diarrhea.  Genitourinary: Positive for frequency. Negative for dysuria.       Incontinence and nocturia  Musculoskeletal: Positive for falls and joint pain.  Skin:  Negative for itching and rash.  Neurological: Positive for dizziness. Negative for focal weakness and headaches.       Sometimes dizzy with standing  Psychiatric/Behavioral: Positive for depression. The patient is nervous/anxious and has insomnia.     BP 124/74 (BP Location: Right Arm, Patient Position: Sitting, Cuff Size: Normal)   Pulse 72   Temp 98.5 F (36.9 C) (Temporal)   Resp 18   Ht 5\' 6"  (1.676 m)   Wt 233 lb 1.9 oz (105.7 kg)  SpO2 97%   BMI 37.63 kg/m   Physical Exam  Constitutional: She appears well-developed and well-nourished. She appears distressed.  Easily distracted, unable to answer some, labile  HENT:  Head: Normocephalic and atraumatic.  Mouth/Throat: Oropharynx is clear and moist.  Eyes: Pupils are equal, round, and reactive to light. Conjunctivae are normal.  Neck: Normal range of motion.  Mild tenderness upper back/neck muscles  Cardiovascular: Normal rate, regular rhythm and normal heart sounds.   Pulmonary/Chest: Effort normal and breath sounds normal. She has no wheezes.  Lymphadenopathy:    She has no cervical adenopathy.  Neurological: She is alert.  Skin:  Large scar R bicep  Psychiatric: Her mood appears anxious. Her speech is delayed. She is slowed. Cognition and memory are impaired. She exhibits a depressed mood.  Ex:  Said she had a mammogram at age 13.  Told me it was over 2 years ago, then remembered that she IS 45, and could not go further. She is inattentive.   ASSESSMENT/PLAN:  1. Anemia, unspecified type Sickle trait by Hx  2. Tobacco abuse Advised to quit, esp with asthma  3. Asthma in adult, mild intermittent, uncomplicated inactive  4. Controlled type 2 diabetes mellitus without complication, without long-term current use of insulin ([place]) By history.  Prior use of metformin. - CBC - COMPLETE METABOLIC PANEL WITH GFR - Lipid panel - Urinalysis, Routine w reflex microscopic - Flu Vaccine QUAD 36+ mos IM - Hemoglobin  A1c  5. Hypertension, unspecified type controlled  6. Screening for breast cancer mammo due - MM Digital Screening; Future  7. Post-menopausal AED medicine, hysterectomy at young age with no HRT - DG Bone Density; Future  8. SUI (stress urinary incontinence, female) By history  9. Chronic mental illness uncertain diagnosis.  Greater than 50% of this visit was spent in counseling and coordinating care.  Total face to face time:  45 min in history review and discussion of care needed, plan    Patient Instructions  Need records Dr Scotty Court Lab work today Mammogram and bone density tests ordered See me in a month for a PE    Raylene Everts, MD

## 2016-10-13 ENCOUNTER — Other Ambulatory Visit: Payer: Self-pay | Admitting: Family Medicine

## 2016-10-13 ENCOUNTER — Encounter: Payer: Self-pay | Admitting: Family Medicine

## 2016-10-13 DIAGNOSIS — Z1231 Encounter for screening mammogram for malignant neoplasm of breast: Secondary | ICD-10-CM

## 2016-10-13 LAB — LIPID PANEL
Cholesterol: 158 mg/dL (ref <200)
HDL: 47 mg/dL — ABNORMAL LOW (ref >50)
LDL Cholesterol (Calc): 96 mg/dL (calc)
Non-HDL Cholesterol (Calc): 111 mg/dL (calc) (ref <130)
Total CHOL/HDL Ratio: 3.4 (calc) (ref <5.0)
Triglycerides: 65 mg/dL (ref <150)

## 2016-10-13 LAB — URINALYSIS, ROUTINE W REFLEX MICROSCOPIC
Bilirubin Urine: NEGATIVE
Glucose, UA: NEGATIVE
Hgb urine dipstick: NEGATIVE
Ketones, ur: NEGATIVE
LEUKOCYTES UA: NEGATIVE
NITRITE: NEGATIVE
Protein, ur: NEGATIVE
SPECIFIC GRAVITY, URINE: 1.025 (ref 1.001–1.03)
pH: 6 (ref 5.0–8.0)

## 2016-10-13 LAB — COMPLETE METABOLIC PANEL WITH GFR
AG RATIO: 1.6 (calc) (ref 1.0–2.5)
ALT: 8 U/L (ref 6–29)
AST: 11 U/L (ref 10–35)
Albumin: 4.1 g/dL (ref 3.6–5.1)
Alkaline phosphatase (APISO): 68 U/L (ref 33–115)
BILIRUBIN TOTAL: 0.5 mg/dL (ref 0.2–1.2)
BUN: 18 mg/dL (ref 7–25)
CHLORIDE: 108 mmol/L (ref 98–110)
CO2: 28 mmol/L (ref 20–32)
Calcium: 9.2 mg/dL (ref 8.6–10.2)
Creat: 1.09 mg/dL (ref 0.50–1.10)
GFR, EST AFRICAN AMERICAN: 71 mL/min/{1.73_m2} (ref >=60)
GFR, Est Non African American: 61 mL/min/{1.73_m2} (ref >=60)
Globulin: 2.6 g/dL (calc) (ref 1.9–3.7)
Glucose, Bld: 83 mg/dL (ref 65–99)
POTASSIUM: 4.3 mmol/L (ref 3.5–5.3)
Sodium: 139 mmol/L (ref 135–146)
TOTAL PROTEIN: 6.7 g/dL (ref 6.1–8.1)

## 2016-10-13 LAB — CBC
HCT: 36.4 % (ref 35.0–45.0)
Hemoglobin: 11.1 g/dL — ABNORMAL LOW (ref 11.7–15.5)
MCH: 22.9 pg — ABNORMAL LOW (ref 27.0–33.0)
MCHC: 30.5 g/dL — ABNORMAL LOW (ref 32.0–36.0)
MCV: 75.1 fL — ABNORMAL LOW (ref 80.0–100.0)
MPV: 11.7 fL (ref 7.5–12.5)
Platelets: 258 10*3/uL (ref 140–400)
RBC: 4.85 10*6/uL (ref 3.80–5.10)
RDW: 15.1 % — ABNORMAL HIGH (ref 11.0–15.0)
WBC: 5.9 10*3/uL (ref 3.8–10.8)

## 2016-10-13 LAB — HEMOGLOBIN A1C
EAG (MMOL/L): 5.7 (calc)
Hgb A1c MFr Bld: 5.2 % of total Hgb (ref <5.7)
MEAN PLASMA GLUCOSE: 103 (calc)

## 2016-10-16 ENCOUNTER — Ambulatory Visit (HOSPITAL_COMMUNITY)
Admission: RE | Admit: 2016-10-16 | Discharge: 2016-10-16 | Disposition: A | Discharge status: Home / Self Care | Payer: BLUE CROSS/BLUE SHIELD | Source: Ambulatory Visit | Attending: Family Medicine | Admitting: Family Medicine

## 2016-10-16 DIAGNOSIS — M8588 Other specified disorders of bone density and structure, other site: Secondary | ICD-10-CM | POA: Insufficient documentation

## 2016-10-16 DIAGNOSIS — M85852 Other specified disorders of bone density and structure, left thigh: Secondary | ICD-10-CM | POA: Diagnosis not present

## 2016-10-16 DIAGNOSIS — Z1231 Encounter for screening mammogram for malignant neoplasm of breast: Secondary | ICD-10-CM | POA: Diagnosis not present

## 2016-10-16 DIAGNOSIS — Z780 Asymptomatic menopausal state: Secondary | ICD-10-CM

## 2016-10-17 ENCOUNTER — Encounter: Payer: Self-pay | Admitting: Family Medicine

## 2016-11-06 DIAGNOSIS — Z86718 Personal history of other venous thrombosis and embolism: Secondary | ICD-10-CM | POA: Insufficient documentation

## 2016-11-24 ENCOUNTER — Encounter: Payer: Self-pay | Admitting: Family Medicine

## 2016-11-24 ENCOUNTER — Other Ambulatory Visit: Payer: Self-pay

## 2016-11-24 ENCOUNTER — Ambulatory Visit (INDEPENDENT_AMBULATORY_CARE_PROVIDER_SITE_OTHER): Payer: BLUE CROSS/BLUE SHIELD | Admitting: Family Medicine

## 2016-11-24 VITALS — BP 130/90 | HR 76 | Temp 97.4°F | Resp 18 | Ht 66.0 in | Wt 230.1 lb

## 2016-11-24 DIAGNOSIS — R300 Dysuria: Secondary | ICD-10-CM

## 2016-11-24 DIAGNOSIS — R079 Chest pain, unspecified: Secondary | ICD-10-CM

## 2016-11-24 DIAGNOSIS — Z0000 Encounter for general adult medical examination without abnormal findings: Secondary | ICD-10-CM | POA: Diagnosis not present

## 2016-11-24 LAB — POCT URINALYSIS DIPSTICK
Bilirubin, UA: NEGATIVE
Blood, UA: NEGATIVE
Glucose, UA: NEGATIVE
Ketones, UA: NEGATIVE
LEUKOCYTES UA: NEGATIVE
Nitrite, UA: NEGATIVE
PROTEIN UA: NEGATIVE
SPEC GRAV UA: 1.02 (ref 1.010–1.025)
Urobilinogen, UA: 0.2 E.U./dL
pH, UA: 6 (ref 5.0–8.0)

## 2016-11-24 NOTE — Patient Instructions (Addendum)
Your lab testing was good Urine test is CLEAR - no infection Blood pressure is 118/80 Go back to Dr Luan Pulling for the shortness of breath Continue under care of Dr Merlene Laughter Drink plenty of water Chest pain is not your heart QUIT SMOKING  See me in 6 months

## 2016-11-24 NOTE — Progress Notes (Signed)
Chief Complaint  Patient presents with  . Annual Exam   Patient is here for a physical examination. Mammogram is up-to-date and is normal. Pap smears not indicated due to hysterectomy status. Colonoscopy not yet indicated, although patient has had one years ago. Immunizations are up-to-date. She complains of worsening shortness of breath.  She is advised again to stop smoking. She also complains of chest pain.  She states that it is random.  But she states it does limit her ability to exercise.  She has had a cardiac workup in the past that was negative.  An EKG performed today is normal. She complains of urinary incontinence and a "strong smell to her urine.  Dip urinalysis is negative.  Patient Active Problem List   Diagnosis Date Noted  . Tobacco abuse 10/12/2016  . Asthma in adult, mild intermittent, uncomplicated 61/95/0932  . Controlled type 2 diabetes mellitus without complication, without long-term current use of insulin ([place]) 10/12/2016  . SUI (stress urinary incontinence, female) 10/12/2016  . Chronic mental illness 10/12/2016  . Hiatal hernia 08/20/2012  . Obesity 11/22/2011  . HTN (hypertension) 09/21/2011  . Chondromalacia of left patella 05/23/2011  . Allergy to latex 03/08/2010  . ANEMIA 03/09/2009  . GASTROESOPHAGEAL REFLUX DISEASE, CHRONIC 03/09/2009  . Constipation 03/09/2009  . SEIZURE DISORDER 03/09/2009  . SCIATICA 11/11/2008    Outpatient Encounter Prescriptions as of 11/24/2016  Medication Sig  . busPIRone (BUSPAR) 10 MG tablet Take 10 mg by mouth 3 (three) times daily. 2 tabs, 3x/day  . cetirizine (ZYRTEC) 10 MG tablet Take 10 mg by mouth every morning.  . citalopram (CELEXA) 40 MG tablet Take 20 mg by mouth daily.   . DULoxetine (CYMBALTA) 30 MG capsule Take 30 mg by mouth daily.  Marland Kitchen gabapentin (NEURONTIN) 100 MG capsule Take 100 mg by mouth 3 (three) times daily.  . Levetiracetam 750 MG TB24 Take 750 mg by mouth at bedtime.  . methocarbamol  (ROBAXIN) 500 MG tablet   . metoCLOPramide (REGLAN) 5 MG tablet Take 5 mg by mouth 4 (four) times daily -  before meals and at bedtime.  Marland Kitchen oxybutynin (DITROPAN) 5 MG tablet Take 5 mg by mouth 2 (two) times daily.  . pantoprazole (PROTONIX) 40 MG tablet TAKE (1) TABLET BY MOUTH ONCE DAILY.  Marland Kitchen Vitamin D, Ergocalciferol, (DRISDOL) 50000 units CAPS capsule Take 50,000 Units by mouth every 7 (seven) days.   No facility-administered encounter medications on file as of 11/24/2016.     Allergies  Allergen Reactions  . Shellfish Allergy Swelling    Swells tongue  . Codeine Other (See Comments)    itching  . Hydrocodone Itching    Rash and itching  . Ivp Dye [Iodinated Diagnostic Agents] Rash    Hands swelling/rash  . Latex Rash    Review of Systems  Constitutional: Negative for activity change, appetite change and unexpected weight change.  HENT: Negative for congestion, dental problem, postnasal drip and rhinorrhea.   Eyes: Negative for redness and visual disturbance.  Respiratory: Positive for shortness of breath. Negative for cough.        Chronic.  Restrictive disease on PFT  Cardiovascular: Positive for chest pain. Negative for palpitations and leg swelling.       Not exertional  Gastrointestinal: Positive for abdominal pain. Negative for constipation and diarrhea.       Chronic abdominal pain and constipation  Genitourinary: Positive for dysuria. Negative for difficulty urinating and frequency.       Incontinence  and strong smell to urine  Musculoskeletal: Positive for gait problem. Negative for arthralgias and back pain.       Problems with gait and balance  Neurological: Negative for dizziness and headaches.  Psychiatric/Behavioral: Negative for dysphoric mood and sleep disturbance. The patient is not nervous/anxious.     BP 130/90 (BP Location: Right Arm, Patient Position: Sitting, Cuff Size: Normal)   Pulse 76   Temp (!) 97.4 F (36.3 C) (Temporal)   Resp 18   Ht 5\' 6"   (1.676 m)   Wt 230 lb 1.9 oz (104.4 kg)   SpO2 100%   BMI 37.14 kg/m   Physical Exam BP 130/90 (BP Location: Right Arm, Patient Position: Sitting, Cuff Size: Normal)   Pulse 76   Temp (!) 97.4 F (36.3 C) (Temporal)   Resp 18   Ht 5\' 6"  (1.676 m)   Wt 230 lb 1.9 oz (104.4 kg)   SpO2 100%   BMI 37.14 kg/m   General Appearance:    Alert, cooperative, no distress, appears stated age.  Obese.  Of talkative.  Poor fund of knowledge.  Head:    Normocephalic, without obvious abnormality, atraumatic  Eyes:    PERRL, conjunctiva/corneas clear, EOM's intact, fundi    benign, both eyes  Ears:    Normal TM's and external ear canals, both ears  Nose:   Nares normal, septum midline, mucosa normal, no drainage    or sinus tenderness  Throat:   Lips, mucosa, and tongue normal; teeth and gums normal  Neck:   Supple, symmetrical, trachea midline, no adenopathy;    thyroid:  no enlargement/tenderness/nodules; no carotid   bruit   Back:     Symmetric, no curvature, ROM normal, no CVA tenderness  Lungs:     Clear to auscultation bilaterally, respirations unlabored  Chest Wall:    No tenderness or deformity   Heart:    Regular rate and rhythm, S1 and S2 normal, no murmur, rub   or gallop  Breast Exam:    No tenderness, masses, or nipple abnormality  Abdomen:     Soft, non-tender, bowel sounds active all four quadrants,    no masses, no organomegaly  Extremities:   Extremities normal, atraumatic, no cyanosis or edema.no muscle atrophy.  Pulses:   2+ and symmetric all extremities  Skin:   Skin color, texture, turgor normal, no rashes or lesions  Lymph nodes:   Cervical, supraclavicular, and axillary nodes normal  Neurologic:   Normal strength, sensation and reflexes    throughout    ASSESSMENT/PLAN:  1. Annual physical exam No abnormal findings.  2. Dysuria Normal urinalysis - POCT Urinalysis Dipstick  3. Chest pain, unspecified type Normal EKG - EKG 12-Lead   Patient Instructions    Your lab testing was good Urine test is CLEAR - no infection Blood pressure is 118/80 Go back to Dr Luan Pulling for the shortness of breath Continue under care of Dr Merlene Laughter Drink plenty of water Chest pain is not your heart QUIT SMOKING  See me in 6 months   Jennifer Everts, MD

## 2016-12-01 DIAGNOSIS — F329 Major depressive disorder, single episode, unspecified: Secondary | ICD-10-CM | POA: Diagnosis not present

## 2016-12-04 ENCOUNTER — Telehealth: Payer: Self-pay | Admitting: Family Medicine

## 2016-12-04 NOTE — Telephone Encounter (Signed)
Patient is requesting inhaler and nitroglycerin pill form. Says she used to take it when she seen DrTapper.  Dr.Tappers office is supposed to be sending over more records. Also, Dr Hoyle Barr at [facility] recovery stopped the capsule medication that patient was prescribed by dr.[email or web address]   Cb# [phone]

## 2016-12-05 ENCOUNTER — Encounter: Payer: Self-pay | Admitting: Family Medicine

## 2016-12-05 DIAGNOSIS — Z86718 Personal history of other venous thrombosis and embolism: Secondary | ICD-10-CM | POA: Insufficient documentation

## 2016-12-05 NOTE — Telephone Encounter (Signed)
I have called ytzel, she is unsure of what inhaler she is looking for, she states it was from dr Luan Pulling, also states she is no longer taking the cymbalta; is asking for nitro " for my heart".  I have called [facility], the only thing they are finding on file for her are flonase nasal spray, and a proair inhaler.

## 2016-12-06 NOTE — Telephone Encounter (Signed)
I told her to go back to Dr Luan Pulling for her shortness of breath.  No inhaler on her records from Dr Wenda Overland I discussed with her that Dr Wenda Overland gave her nitro for chest pain to take until she saw cardiology.  The cardiology - heart specialist said she had no heart problem and to stop the nitro.  I will not refill a medicine the cardiologist said is not needed.

## 2016-12-06 NOTE — Telephone Encounter (Signed)
Patient informed of message below, verbalized understanding.  

## 2017-02-15 ENCOUNTER — Other Ambulatory Visit: Payer: Self-pay

## 2017-02-15 ENCOUNTER — Encounter: Payer: Self-pay | Admitting: Family Medicine

## 2017-02-15 ENCOUNTER — Ambulatory Visit: Payer: BLUE CROSS/BLUE SHIELD | Admitting: Family Medicine

## 2017-02-15 VITALS — BP 120/82 | HR 92 | Temp 98.7°F | Resp 18 | Ht 66.0 in | Wt 237.1 lb

## 2017-02-15 DIAGNOSIS — L309 Dermatitis, unspecified: Secondary | ICD-10-CM

## 2017-02-15 DIAGNOSIS — L659 Nonscarring hair loss, unspecified: Secondary | ICD-10-CM

## 2017-02-15 MED ORDER — TRIAMCINOLONE ACETONIDE 0.5 % EX OINT: 1.0000 "application " | TOPICAL_OINTMENT | Freq: Two times a day (BID) | CUTANEOUS | 0 refills | Status: DC

## 2017-02-15 NOTE — Patient Instructions (Signed)
Get blood test today I will send you a letter with your test results.  If there is anything of concern, we will call right away.  Referred to dermatology  Use the ointment on the arm rash two times a day

## 2017-02-15 NOTE — Progress Notes (Signed)
Chief Complaint  Patient presents with  . Alopecia   Patient called this morning and asked to be seen today.  She has 2 problems. First, she has a spot on her arm that is itchy and irritated.  She thinks it might be a spider bite.  She thinks it might be a rash she called from another person.  She is not sure where.  It has been there for about 2 weeks.  She is keeping a Band-Aid on it.  It is "weepy".  Not painful.  It does it itch. Other problems general alopecia.  She has thinning of her hair around her hairline and forehead, she also has a patch on the top of her head where there does not seem to grow.  She does admit that she used to have nerve problems and pulled her hair, but she does not do this anymore.  When I asked her husband, he just shakes his head.  She states that she used to take a hair vitamin but quit taking this.  She does not have heat intolerance, cold intolerance, weight change, constipation diarrhea palpitations or any thyroid/metabolic symptoms.  Patient Active Problem List   Diagnosis Date Noted  . History of deep vein thrombosis (DVT) of lower extremity 12/05/2016  . Tobacco abuse 10/12/2016  . Asthma in adult, mild intermittent, uncomplicated 44/02/270  . Controlled type 2 diabetes mellitus without complication, without long-term current use of insulin ([place]) 10/12/2016  . SUI (stress urinary incontinence, female) 10/12/2016  . Chronic mental illness 10/12/2016  . Hiatal hernia 08/20/2012  . Obesity 11/22/2011  . HTN (hypertension) 09/21/2011  . Chondromalacia of left patella 05/23/2011  . Allergy to latex 03/08/2010  . ANEMIA 03/09/2009  . GASTROESOPHAGEAL REFLUX DISEASE, CHRONIC 03/09/2009  . Constipation 03/09/2009  . SEIZURE DISORDER 03/09/2009  . SCIATICA 11/11/2008    Outpatient Encounter Medications as of 02/15/2017  Medication Sig  . busPIRone (BUSPAR) 10 MG tablet Take 10 mg by mouth 3 (three) times daily. 2 tabs, 3x/day  . cetirizine (ZYRTEC)  10 MG tablet Take 10 mg by mouth every morning.  . citalopram (CELEXA) 40 MG tablet Take 20 mg by mouth daily.   Marland Kitchen gabapentin (NEURONTIN) 100 MG capsule Take 100 mg by mouth 3 (three) times daily.  . Levetiracetam 750 MG TB24 Take 750 mg by mouth at bedtime.  . methocarbamol (ROBAXIN) 500 MG tablet   . metoCLOPramide (REGLAN) 5 MG tablet Take 5 mg by mouth 4 (four) times daily -  before meals and at bedtime.  Marland Kitchen oxybutynin (DITROPAN) 5 MG tablet Take 5 mg by mouth 2 (two) times daily.  . pantoprazole (PROTONIX) 40 MG tablet TAKE (1) TABLET BY MOUTH ONCE DAILY.  Marland Kitchen Vitamin D, Ergocalciferol, (DRISDOL) 50000 units CAPS capsule Take 50,000 Units by mouth every 7 (seven) days.  . busPIRone (BUSPAR) 30 MG tablet   . DULoxetine (CYMBALTA) 30 MG capsule   . estradiol (ESTRACE) 0.1 MG/GM vaginal cream Place vaginally.  . triamcinolone ointment (KENALOG) 0.5 % Apply 1 application topically 2 (two) times daily.   No facility-administered encounter medications on file as of 02/15/2017.     Allergies  Allergen Reactions  . Shellfish Allergy Swelling    Swells tongue  . Codeine Other (See Comments)    itching  . Hydrocodone Itching    Rash and itching  . Ivp Dye [Iodinated Diagnostic Agents] Rash    Hands swelling/rash  . Latex Rash    Review of Systems  Constitutional:  Negative for activity change and appetite change.  HENT: Negative for congestion and dental problem.   Cardiovascular: Negative for palpitations.  Gastrointestinal: Negative for constipation and diarrhea.  Endocrine: Negative for cold intolerance and heat intolerance.  Skin: Positive for rash.       See HPI  Psychiatric/Behavioral: Negative for dysphoric mood. The patient is not nervous/anxious.        Denies    BP 120/82 (BP Location: Left Arm, Patient Position: Sitting, Cuff Size: Large)   Pulse 92   Temp 98.7 F (37.1 C) (Temporal)   Resp 18   Ht 5\' 6"  (1.676 m)   Wt 237 lb 1.3 oz (107.5 kg)   SpO2 99%   BMI 38.27  kg/m   Physical Exam  Constitutional: She is oriented to person, place, and time. She appears well-developed and well-nourished. No distress.  HENT:  Head: Normocephalic and atraumatic.  Mouth/Throat: Oropharynx is clear and moist.  Eyes: Conjunctivae are normal. Pupils are equal, round, and reactive to light.  Neck: Normal range of motion.  Lymphadenopathy:    She has no cervical adenopathy.  Neurological: She is alert and oriented to person, place, and time.  Skin:  On the left bicep there is a circular papule with some clear serosanguineous exudate, no clearing in the center, no vesicles, no scale.  The scalp is examined.  She does have thin hair around the front of her scalp.  It looks like traction alopecia.  She does have a patch on the top of her his head with very short hairs of different lengths.  It looks like trichotillomania.  Because of her concerns however I am going to send her to dermatology.  Psychiatric: Her behavior is normal. Thought content normal.    ASSESSMENT/PLAN:  1. Alopecia Probable traction/pulling as cause. - TSH - HIV antibody - RPR - Fe+TIBC+Fer - Ambulatory referral to Dermatology  2. Dermatitis Unclear etiology.  Trial of cortisone cream.  It looks more eczematous than infectious.   Patient Instructions  Get blood test today I will send you a letter with your test results.  If there is anything of concern, we will call right away.  Referred to dermatology  Use the ointment on the arm rash two times a day   Raylene Everts, MD

## 2017-02-16 ENCOUNTER — Encounter: Payer: Self-pay | Admitting: Family Medicine

## 2017-02-16 LAB — RPR: RPR Ser Ql: NONREACTIVE

## 2017-02-16 LAB — IRON,TIBC AND FERRITIN PANEL
%SAT: 26 % (calc) (ref 11–50)
Ferritin: 89 ng/mL (ref 10–232)
IRON: 80 ug/dL (ref 40–190)
TIBC: 303 mcg/dL (calc) (ref 250–450)

## 2017-02-16 LAB — HIV ANTIBODY (ROUTINE TESTING W REFLEX): HIV 1&2 Ab, 4th Generation: NONREACTIVE

## 2017-02-16 LAB — TSH: TSH: 2.83 mIU/L

## 2017-02-19 ENCOUNTER — Telehealth: Payer: Self-pay | Admitting: Family Medicine

## 2017-02-19 NOTE — Telephone Encounter (Signed)
Patient called in to check the status of her labs from Friday, she would like to discuss. Cb#: [phone]

## 2017-02-19 NOTE — Telephone Encounter (Signed)
Patient received a my chart message this morning explaining that her labs were normal and there was no medical reason for her to have hair loss

## 2017-02-20 NOTE — Telephone Encounter (Signed)
Spoke to [place], aware of lab results.

## 2017-02-21 DIAGNOSIS — L308 Other specified dermatitis: Secondary | ICD-10-CM | POA: Diagnosis not present

## 2017-02-21 DIAGNOSIS — B9689 Other specified bacterial agents as the cause of diseases classified elsewhere: Secondary | ICD-10-CM | POA: Diagnosis not present

## 2017-02-21 DIAGNOSIS — L02821 Furuncle of head [any part, except face]: Secondary | ICD-10-CM | POA: Diagnosis not present

## 2017-03-14 DIAGNOSIS — G40409 Other generalized epilepsy and epileptic syndromes, not intractable, without status epilepticus: Secondary | ICD-10-CM | POA: Diagnosis not present

## 2017-03-14 DIAGNOSIS — Z79899 Other long term (current) drug therapy: Secondary | ICD-10-CM | POA: Diagnosis not present

## 2017-03-14 DIAGNOSIS — M542 Cervicalgia: Secondary | ICD-10-CM | POA: Diagnosis not present

## 2017-03-14 DIAGNOSIS — M62838 Other muscle spasm: Secondary | ICD-10-CM | POA: Diagnosis not present

## 2017-03-19 DIAGNOSIS — F339 Major depressive disorder, recurrent, unspecified: Secondary | ICD-10-CM | POA: Diagnosis not present

## 2017-03-20 ENCOUNTER — Other Ambulatory Visit: Payer: Self-pay | Admitting: Nurse Practitioner

## 2017-03-29 DIAGNOSIS — F17200 Nicotine dependence, unspecified, uncomplicated: Secondary | ICD-10-CM | POA: Diagnosis not present

## 2017-03-29 DIAGNOSIS — Z79899 Other long term (current) drug therapy: Secondary | ICD-10-CM | POA: Diagnosis not present

## 2017-03-29 DIAGNOSIS — F319 Bipolar disorder, unspecified: Secondary | ICD-10-CM | POA: Diagnosis not present

## 2017-03-29 DIAGNOSIS — R109 Unspecified abdominal pain: Secondary | ICD-10-CM | POA: Diagnosis not present

## 2017-03-29 DIAGNOSIS — F419 Anxiety disorder, unspecified: Secondary | ICD-10-CM | POA: Diagnosis not present

## 2017-03-29 DIAGNOSIS — J45909 Unspecified asthma, uncomplicated: Secondary | ICD-10-CM | POA: Diagnosis not present

## 2017-04-02 ENCOUNTER — Telehealth: Payer: Self-pay

## 2017-04-02 NOTE — Telephone Encounter (Signed)
Pt stopped in office with a jury summons, is wanting an excuse from you based on her mental illness. She states she went to her mental health provider, and the nurse told her she needed to go.

## 2017-04-02 NOTE — Telephone Encounter (Signed)
Unfortunately, I am unaware of the diagnoses being treated by her psychiatric provider.  I know that she has disability approved by the state, but not for what conditions.  I am unable to say that she cannot perform jury duty.

## 2017-04-03 NOTE — Telephone Encounter (Signed)
Called and spoke to [place], aware dr Meda Coffee is unable to write note.

## 2017-04-11 ENCOUNTER — Telehealth: Payer: Self-pay | Admitting: Internal Medicine

## 2017-04-11 NOTE — Telephone Encounter (Signed)
Pt called to make an OV and has questions about her medications. Please call her at [phone]

## 2017-04-11 NOTE — Telephone Encounter (Signed)
Tried calling pt. No Voicemail is setup, not able to leave a message.

## 2017-04-11 NOTE — Telephone Encounter (Signed)
Pt called back. Pt is taking a PPI now and is still having flareups with reflux. Pt is scheduled for 06/06/17 and will come in sooner for evaluation if our office gets a cancellation.

## 2017-04-16 ENCOUNTER — Encounter: Payer: Self-pay | Admitting: Family Medicine

## 2017-04-19 DIAGNOSIS — R569 Unspecified convulsions: Secondary | ICD-10-CM | POA: Diagnosis not present

## 2017-04-19 DIAGNOSIS — J309 Allergic rhinitis, unspecified: Secondary | ICD-10-CM | POA: Diagnosis not present

## 2017-04-19 DIAGNOSIS — D239 Other benign neoplasm of skin, unspecified: Secondary | ICD-10-CM | POA: Diagnosis not present

## 2017-04-19 DIAGNOSIS — F209 Schizophrenia, unspecified: Secondary | ICD-10-CM | POA: Diagnosis not present

## 2017-05-17 DIAGNOSIS — Z6839 Body mass index (BMI) 39.0-39.9, adult: Secondary | ICD-10-CM | POA: Diagnosis not present

## 2017-05-17 DIAGNOSIS — L02214 Cutaneous abscess of groin: Secondary | ICD-10-CM | POA: Diagnosis not present

## 2017-05-25 ENCOUNTER — Ambulatory Visit: Payer: BLUE CROSS/BLUE SHIELD | Admitting: Family Medicine

## 2017-05-28 ENCOUNTER — Ambulatory Visit: Payer: BLUE CROSS/BLUE SHIELD | Admitting: Family Medicine

## 2017-05-29 DIAGNOSIS — Z01419 Encounter for gynecological examination (general) (routine) without abnormal findings: Secondary | ICD-10-CM | POA: Diagnosis not present

## 2017-05-29 DIAGNOSIS — L739 Follicular disorder, unspecified: Secondary | ICD-10-CM | POA: Diagnosis not present

## 2017-05-29 DIAGNOSIS — B373 Candidiasis of vulva and vagina: Secondary | ICD-10-CM | POA: Diagnosis not present

## 2017-05-29 DIAGNOSIS — Z131 Encounter for screening for diabetes mellitus: Secondary | ICD-10-CM | POA: Diagnosis not present
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
## 2017-05-31 ENCOUNTER — Encounter (HOSPITAL_COMMUNITY): Payer: Self-pay | Admitting: Psychiatry

## 2017-05-31 ENCOUNTER — Encounter (INDEPENDENT_AMBULATORY_CARE_PROVIDER_SITE_OTHER): Payer: Self-pay

## 2017-05-31 ENCOUNTER — Ambulatory Visit (INDEPENDENT_AMBULATORY_CARE_PROVIDER_SITE_OTHER): Payer: BLUE CROSS/BLUE SHIELD | Admitting: Psychiatry

## 2017-05-31 VITALS — BP 131/85 | HR 78 | Ht 66.0 in | Wt 244.0 lb

## 2017-05-31 DIAGNOSIS — F0630 Mood disorder due to known physiological condition, unspecified: Secondary | ICD-10-CM | POA: Diagnosis not present

## 2017-05-31 DIAGNOSIS — F410 Panic disorder [episodic paroxysmal anxiety] without agoraphobia: Secondary | ICD-10-CM

## 2017-05-31 DIAGNOSIS — Z813 Family history of other psychoactive substance abuse and dependence: Secondary | ICD-10-CM

## 2017-05-31 DIAGNOSIS — Z810 Family history of intellectual disabilities: Secondary | ICD-10-CM | POA: Diagnosis not present

## 2017-05-31 DIAGNOSIS — Z818 Family history of other mental and behavioral disorders: Secondary | ICD-10-CM

## 2017-05-31 DIAGNOSIS — F259 Schizoaffective disorder, unspecified: Secondary | ICD-10-CM

## 2017-05-31 DIAGNOSIS — R450 Nervousness: Secondary | ICD-10-CM

## 2017-05-31 DIAGNOSIS — Z736 Limitation of activities due to disability: Secondary | ICD-10-CM

## 2017-05-31 DIAGNOSIS — Z811 Family history of alcohol abuse and dependence: Secondary | ICD-10-CM

## 2017-05-31 DIAGNOSIS — F419 Anxiety disorder, unspecified: Secondary | ICD-10-CM

## 2017-05-31 DIAGNOSIS — F4310 Post-traumatic stress disorder, unspecified: Secondary | ICD-10-CM | POA: Diagnosis not present

## 2017-05-31 DIAGNOSIS — Z91410 Personal history of adult physical and sexual abuse: Secondary | ICD-10-CM | POA: Diagnosis not present

## 2017-05-31 DIAGNOSIS — G4700 Insomnia, unspecified: Secondary | ICD-10-CM

## 2017-05-31 DIAGNOSIS — Z91411 Personal history of adult psychological abuse: Secondary | ICD-10-CM

## 2017-05-31 DIAGNOSIS — F17210 Nicotine dependence, cigarettes, uncomplicated: Secondary | ICD-10-CM

## 2017-05-31 DIAGNOSIS — Z590 Homelessness: Secondary | ICD-10-CM

## 2017-05-31 MED ORDER — TRAZODONE HCL 50 MG PO TABS: ORAL_TABLET | ORAL | 0 refills | Status: DC

## 2017-05-31 MED ORDER — LURASIDONE HCL 20 MG PO TABS: 20.0000 mg | ORAL_TABLET | Freq: Every day | ORAL | 0 refills | Status: DC

## 2017-05-31 MED ORDER — BUSPIRONE HCL 10 MG PO TABS: 20.0000 mg | ORAL_TABLET | Freq: Three times a day (TID) | ORAL | 0 refills | Status: DC

## 2017-05-31 MED ORDER — DULOXETINE HCL 60 MG PO CPEP: 60.0000 mg | ORAL_CAPSULE | Freq: Every day | ORAL | 0 refills | Status: DC

## 2017-05-31 NOTE — Patient Instructions (Signed)
1. Continue duloxetine 60 mg daily  2. Continue buspar 20 mg three times a day  3. Start latuda 20 mg daily (provide sample for a month) 4. Start trazodone 25 - 50 mg at night as needed for sleep 5. Return to clinic in one month for 30 mins 6. Consider therapy

## 2017-06-05 ENCOUNTER — Telehealth (HOSPITAL_COMMUNITY): Payer: Self-pay

## 2017-06-05 NOTE — Telephone Encounter (Signed)
Buspar is 20 mg three times a day. I will forward this mail to [place] to ask help to get latuda.

## 2017-06-05 NOTE — Telephone Encounter (Signed)
Pharmacy called stating that patient's insurance will not pay for Latuda 20mg  is there another medication that can be prescribed also they need clarification on Buspar 10 mg. Per pharmacy tech, there instructions were unclear. Please advise.

## 2017-06-06 ENCOUNTER — Encounter: Payer: Self-pay | Admitting: Gastroenterology

## 2017-06-06 ENCOUNTER — Ambulatory Visit (INDEPENDENT_AMBULATORY_CARE_PROVIDER_SITE_OTHER): Payer: BLUE CROSS/BLUE SHIELD | Admitting: Gastroenterology

## 2017-06-06 ENCOUNTER — Other Ambulatory Visit: Payer: Self-pay

## 2017-06-06 VITALS — BP 105/76 | HR 116 | Temp 97.1°F | Ht 66.0 in | Wt 243.6 lb

## 2017-06-06 DIAGNOSIS — R1013 Epigastric pain: Secondary | ICD-10-CM | POA: Insufficient documentation

## 2017-06-06 DIAGNOSIS — K625 Hemorrhage of anus and rectum: Secondary | ICD-10-CM | POA: Insufficient documentation

## 2017-06-06 DIAGNOSIS — K6289 Other specified diseases of anus and rectum: Secondary | ICD-10-CM

## 2017-06-06 DIAGNOSIS — K219 Gastro-esophageal reflux disease without esophagitis: Secondary | ICD-10-CM | POA: Diagnosis not present

## 2017-06-06 DIAGNOSIS — E669 Obesity, unspecified: Secondary | ICD-10-CM

## 2017-06-06 MED ORDER — PANTOPRAZOLE SODIUM 40 MG PO TBEC: 40.0000 mg | DELAYED_RELEASE_TABLET | Freq: Two times a day (BID) | ORAL | 5 refills | Status: DC

## 2017-06-06 MED ORDER — HYDROCORTISONE 2.5 % RE CREA: 1.0000 "application " | TOPICAL_CREAM | Freq: Two times a day (BID) | RECTAL | 1 refills | Status: DC

## 2017-06-06 NOTE — Patient Instructions (Addendum)
1. I will get copy of ER visit for review.  2. Increase pantoprazole twice a day. I will call in new RX for you. 3. Anusol cream twice daily to rectum for 2 weeks.  4. Barium xray to evaluate your swallowing and reflux.

## 2017-06-06 NOTE — Telephone Encounter (Signed)
[place] spoke with Nira Conn and informed her of the message from Dr. Modesta Messing.

## 2017-06-06 NOTE — Progress Notes (Signed)
Primary Care Physician: Sandi Mealy, MD  Primary Gastroenterologist:  Garfield Cornea, MD   Chief Complaint  Patient presents with  . Gastroesophageal Reflux    f/u.     HPI: Jennifer [name] is a 47 y.o. female here for follow-up.  She has a history of GERD and constipation.  She was last seen in August 2017.  Patient states she has been on PPI therapy since we last saw her.  She was on Nexium at time of last office visit but now on pantoprazole 40 mg daily.  She states she was switched during 1 of her ED visits at [facility].  She describes a lot of gas/belching.  When she lays down or reclines significantly, she has stuff back up into her throat.  Sometimes wakes up with secretions on her face where she is regurgitated during the night.  Looking back through the records he complained of similar symptoms several years back.  She feels a lot of bloating and discomfort throughout her abdomen but usually mostly in the upper abdomen.  Sometimes related to food.  Has increased pain when she tries to rollover.  Complains of 2-week history of perianal itching.  Tried witch hazel and developed severe pain from that.  Several days ago she noted bright red blood with wiping.  She feels a lump on her anus.  She reports that her bowel movements have been very regular over the last several months.  As long as she drinks coffee she has several BMs throughout the day.  Sometimes she passes a significant amount of stool, sometimes so much is difficult to flush.  Stools are soft.  Does not strain or sit long.  She does take fiber and/or MiraLAX as needed especially if her stools get hard or she develops abdominal cramping.  Every morning when she wakes up she has some soreness in the epigastrium.  Definitely worse with certain foods such as eggs and bacon.  I note that her weight is up 25 pounds since we last saw her.  I was able to retrieve a copy of CT abdomen and pelvis with contrast dated 03/29/2017  through the PACS system.  Comparisons were made to multiple prior imaging as distant as 2005.  Chronic cyst at the capsular margin of the inferior aspect of the right lobe the liver, maximum diameter 3.4 cm.  Previous MRIs have shown benign characteristics.  14 mm left adrenal adenoma, stable.  Fairly large amount of fecal matter in the right colon.  EGD October 2015, patulous EG junction, mild erosive reflux esophagitis.  Colonoscopy January 2013, external hemorrhoids but otherwise normal.    Gastric emptying study in 2009 was normal.  Review of epic indicates that she has been on metoclopramide dating back to early 2016.  Prescribed by her PCP.  Current Outpatient Medications  Medication Sig Dispense Refill  . aspirin EC 81 MG tablet Take 81 mg by mouth daily.    . busPIRone (BUSPAR) 10 MG tablet Take 2 tablets (20 mg total) by mouth 3 (three) times daily. 2 tabs, 3x/day 60 tablet 0  . cetirizine (ZYRTEC) 10 MG tablet Take 10 mg by mouth every morning.    . clobetasol cream (TEMOVATE) 3.23 % Apply 1 application topically as needed.    . doxycycline (VIBRA-TABS) 100 MG tablet Take 100 mg by mouth 2 (two) times daily.    . DULoxetine (CYMBALTA) 60 MG capsule Take 1 capsule (60 mg total) by mouth daily. 30 capsule  0  . fluticasone (FLONASE) 50 MCG/ACT nasal spray Place 1 spray into both nostrils daily.    Marland Kitchen gabapentin (NEURONTIN) 100 MG capsule Take 100 mg by mouth 3 (three) times daily.  1  . Levetiracetam 750 MG TB24 Take 750 mg by mouth at bedtime.  4  . metoCLOPramide (REGLAN) 5 MG tablet Take 5 mg by mouth 4 (four) times daily -  before meals and at bedtime.    Marland Kitchen oxybutynin (DITROPAN) 5 MG tablet Take 5 mg by mouth 2 (two) times daily.    . pantoprazole (PROTONIX) 40 MG tablet TAKE (1) TABLET BY MOUTH ONCE DAILY. 28 tablet 11  . traZODone (DESYREL) 50 MG tablet 25-50 mg at night as needed for sleep 30 tablet 0  . Vitamin D, Ergocalciferol, (DRISDOL) 50000 units CAPS capsule Take 50,000 Units  by mouth every 7 (seven) days.    Marland Kitchen estradiol (ESTRACE) 0.1 MG/GM vaginal cream Place vaginally.    Marland Kitchen lurasidone (LATUDA) 20 MG TABS tablet Take 1 tablet (20 mg total) by mouth daily. (Patient not taking: Reported on 06/06/2017) 30 tablet 0  . triamcinolone ointment (KENALOG) 0.5 % Apply 1 application topically 2 (two) times daily. (Patient not taking: Reported on 06/06/2017) 30 g 0   No current facility-administered medications for this visit.     Allergies as of 06/06/2017 - Review Complete 06/06/2017  Allergen Reaction Noted  . Metrizamide Rash and Swelling 10/17/2010  . Shellfish allergy Swelling 10/29/2013  . Codeine Other (See Comments)   . Hydrocodone Itching 12/03/2012  . Ivp dye [iodinated diagnostic agents] Rash 10/17/2010  . Latex Rash 03/08/2010   Past Medical History:  Diagnosis Date  . Allergy   . Anemia   . Anxiety   . Arthritis    neck  . Asthma   . Bipolar 1 disorder ([place])   . Depression   . Diabetes mellitus without complication ([place])   . GERD (gastroesophageal reflux disease)   . History of flexible sigmoidoscopy 1999   Normal to 60cm,  . Hypertension   . Neuromuscular disorder ([place])   . Ovarian cancer ([place])    skin cancer  . Panic attack   . PTSD (post-traumatic stress disorder)   . S/P endoscopy Feb 2012   Nl, s/p 56-French [facility] dilator, biopsy benign  . S/P endoscopy 2008   Mild gastritis  . Schizophrenia ([place])   . Seizures ([place])   . Substance abuse ([place])    drug addiction   Past Surgical History:  Procedure Laterality Date  . ABDOMINAL HYSTERECTOMY     ovarian tumors  . CHOLECYSTECTOMY    . COLONOSCOPY  03/01/2011   Rourk-External hemorrhoids/otherwise normal rectum and colon.  . ESOPHAGOGASTRODUODENOSCOPY  2008   gastritis  . ESOPHAGOGASTRODUODENOSCOPY  2012   Moderate sized hiatal hernia, non-H. pylori gastritis  . ESOPHAGOGASTRODUODENOSCOPY N/A 11/17/2013   RMR: Mild erosive reflux esophagitis. Patulous EG junction  . HERNIA REPAIR       Incisional with mesh  . PARTIAL HYSTERECTOMY    . SKIN CANCER EXCISION     left bicep  . TONSILLECTOMY      ROS:  General: Negative for anorexia, weight loss, fever, chills, fatigue, weakness. ENT: Negative for hoarseness, difficulty swallowing , nasal congestion. CV: Negative for chest pain, angina, palpitations, dyspnea on exertion, peripheral edema.  Respiratory: Negative for dyspnea at rest, dyspnea on exertion, cough, sputum, wheezing.  GI: See history of present illness. GU:  Negative for dysuria, hematuria, urinary incontinence, urinary frequency, nocturnal urination.  Endo: Negative for unusual weight change.    Physical Examination:   BP 105/76   Pulse (!) 116   Temp (!) 97.1 F (36.2 C) (Oral)   Ht 5\' 6"  (1.676 m)   Wt 243 lb 9.6 oz (110.5 kg)   BMI 39.32 kg/m   General: Well-nourished, well-developed in no acute distress.  Eyes: No icterus. Mouth: Oropharyngeal mucosa moist and pink , no lesions erythema or exudate. Lungs: Clear to auscultation bilaterally.  Heart: Regular rate and rhythm, no murmurs rubs or gallops.  Abdomen: Bowel sounds are normal, mild diffuse tenderness especially with palpation over left lower ribs.  nondistended, no hepatosplenomegaly or masses, no abdominal bruits or hernia , no rebound or guarding.  Rectal: no masses. Brown stool heme negative. Small external hemorrhoid with excoriations and tender located at 3'oclock, inside anal canal suspected hemorrhoid at 9'oclock  Extremities: No lower extremity edema. No clubbing or deformities. Neuro: Alert and oriented x 4   Skin: Warm and dry, no jaundice.   Psych: Alert and cooperative, normal mood and affect.  Labs:  Lab Results  Component Value Date   CREATININE 1.09 10/12/2016   BUN 18 10/12/2016   NA 139 10/12/2016   K 4.3 10/12/2016   CL 108 10/12/2016   CO2 28 10/12/2016   Lab Results  Component Value Date   ALT 8 10/12/2016   AST 11 10/12/2016   ALKPHOS 68 10/12/2016    BILITOT 0.5 10/12/2016   Lab Results  Component Value Date   WBC 5.9 10/12/2016   HGB 11.1 (L) 10/12/2016   HCT 36.4 10/12/2016   MCV 75.1 (L) 10/12/2016   PLT 258 10/12/2016   Lab Results  Component Value Date   IRON 80 02/15/2017   TIBC 303 02/15/2017   FERRITIN 89 02/15/2017    Imaging Studies: No results found.

## 2017-06-07 ENCOUNTER — Encounter: Payer: Self-pay | Admitting: Internal Medicine

## 2017-06-07 NOTE — Assessment & Plan Note (Signed)
Rectal pain/bleeding likely due to hemorrhoids with notable excoriations/erythema of hemorrhoid noted externally. Last TCS 2013, external hemorrhoids but otherwise normal. Treat with topical hydrocortisone cream. Avoid constipation, prolonged sitting, straining.

## 2017-06-07 NOTE — Progress Notes (Signed)
Received labs from recent ED visit dated 03/29/2017.  Glucose 90, BUN 16, creatinine 1.18, total bilirubin 0.3, alkaline phosphatase 80, AST 12.5, ALT 8, albumin 4.3, white blood cell count 7100, hemoglobin 11.9 normal, hematocrit 38.1, MCV 74.1, platelets 262,000, urinalysis unremarkable.  Await pending barium study.

## 2017-06-07 NOTE — Assessment & Plan Note (Signed)
Epigastric pain in setting of gas/belching/reflux. Has been on pantoprazole without relief. Sometimes related to meals. Increased abd pain with rolling over. Symptoms may be multifactorial. Recent CT in 03/2017 reassuring. Last EGD 2015 with reflux esophagitis. Will increase pantoprazole to 40mg  BID. Get a copy of recent ED visit. UGI series to evaluate her abdominal pain as well as her reflux/dysphagia. Further recommendations to follow.

## 2017-06-08 NOTE — Progress Notes (Signed)
cc'ed to pcp °

## 2017-06-11 ENCOUNTER — Ambulatory Visit (HOSPITAL_COMMUNITY)
Admission: RE | Admit: 2017-06-11 | Discharge: 2017-06-11 | Disposition: A | Discharge status: Home / Self Care | Payer: BLUE CROSS/BLUE SHIELD | Source: Ambulatory Visit | Attending: Gastroenterology | Admitting: Gastroenterology

## 2017-06-11 DIAGNOSIS — K219 Gastro-esophageal reflux disease without esophagitis: Secondary | ICD-10-CM | POA: Diagnosis not present

## 2017-06-11 DIAGNOSIS — K449 Diaphragmatic hernia without obstruction or gangrene: Secondary | ICD-10-CM | POA: Diagnosis not present

## 2017-06-11 DIAGNOSIS — K6289 Other specified diseases of anus and rectum: Secondary | ICD-10-CM | POA: Insufficient documentation

## 2017-06-11 DIAGNOSIS — K625 Hemorrhage of anus and rectum: Secondary | ICD-10-CM | POA: Diagnosis not present

## 2017-06-11 DIAGNOSIS — R1013 Epigastric pain: Secondary | ICD-10-CM | POA: Insufficient documentation

## 2017-06-11 NOTE — Progress Notes (Signed)
cc'ed to pcp °

## 2017-06-12 DIAGNOSIS — Z6841 Body Mass Index (BMI) 40.0 and over, adult: Secondary | ICD-10-CM | POA: Diagnosis not present

## 2017-06-12 DIAGNOSIS — N289 Disorder of kidney and ureter, unspecified: Secondary | ICD-10-CM | POA: Diagnosis not present

## 2017-06-13 ENCOUNTER — Telehealth: Payer: Self-pay | Admitting: Internal Medicine

## 2017-06-13 NOTE — Telephone Encounter (Signed)
Spoke with pt and she wants [facility] to know that Dr. Juleen China Buist saw her this week and wants to ref her to a kidney specialist after reviewing lab work. Pt also says after her Xray/GI series Monday 06/11/17, she had developed severe pain in her lower abdomen that makes it hard to walk. Pt said she was told that her stool would be hard and it is. Pt was advised to go to the ED if her pain becomes unbearable and symptoms worsen.

## 2017-06-13 NOTE — Telephone Encounter (Signed)
[phone]  PLEASE CALL PATIENT HER GYNO IS WANTING TO SEND HER TO A KIDNEY DOCTOR AND SHE HAS SOME QUESTIONS

## 2017-06-14 NOTE — Telephone Encounter (Addendum)
Reviewed OV for Dr. Evie Lacks on Care Everywhere. Nothing regarding sending her to kidney specialist, but if he has recommended that, she should follow through.   If she is having hard stools after barium study she can take dulcolax 15mg  (3 tablets) once, and drink one capful of miralax every hour up to five times until she has several good loose stools. If she needs longer treatment, she can continue miralax every day.   If develops significant abdominal pain consider ER evaluation.   Continue increased pantoprazole bid as outlined at time of OV  Please let her know her UGI series was normal.   Return to office in two months for follow up.

## 2017-06-15 NOTE — Telephone Encounter (Signed)
PATIENT SCHEDULED  °

## 2017-06-15 NOTE — Telephone Encounter (Signed)
Spoke with pt and is aware of recs. She voiced understanding. Fowarding to stacey for appt

## 2017-06-20 ENCOUNTER — Telehealth (HOSPITAL_COMMUNITY): Payer: Self-pay | Admitting: *Deleted

## 2017-06-20 NOTE — Telephone Encounter (Signed)
I believe she has started latuda, not buspar (she has been on buspar for a while). It is not common side effect. Agree with her seeing PCP.

## 2017-06-20 NOTE — Telephone Encounter (Signed)
Dr Modesta Messing Patient called stating that she's been having headaches & now a bump on her head & loss of hair since  starting Buspar. She questioned if Buspar may be the cause?  Stated previously she was seeing a  Paediatric nurse for hair loss. Encourage to call her PCP concerning Bump on head & headache pain

## 2017-06-21 DIAGNOSIS — R399 Unspecified symptoms and signs involving the genitourinary system: Secondary | ICD-10-CM | POA: Diagnosis not present

## 2017-06-21 DIAGNOSIS — F200 Paranoid schizophrenia: Secondary | ICD-10-CM | POA: Diagnosis not present

## 2017-06-21 DIAGNOSIS — D239 Other benign neoplasm of skin, unspecified: Secondary | ICD-10-CM | POA: Diagnosis not present

## 2017-06-21 DIAGNOSIS — K5909 Other constipation: Secondary | ICD-10-CM | POA: Diagnosis not present
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
## 2017-07-05 ENCOUNTER — Encounter (HOSPITAL_COMMUNITY): Payer: Self-pay | Admitting: Psychiatry

## 2017-07-05 ENCOUNTER — Ambulatory Visit (INDEPENDENT_AMBULATORY_CARE_PROVIDER_SITE_OTHER): Payer: BLUE CROSS/BLUE SHIELD | Admitting: Psychiatry

## 2017-07-05 VITALS — BP 108/77 | HR 94 | Ht 66.0 in | Wt 248.0 lb

## 2017-07-05 DIAGNOSIS — Z560 Unemployment, unspecified: Secondary | ICD-10-CM | POA: Diagnosis not present

## 2017-07-05 DIAGNOSIS — F419 Anxiety disorder, unspecified: Secondary | ICD-10-CM

## 2017-07-05 DIAGNOSIS — G4700 Insomnia, unspecified: Secondary | ICD-10-CM | POA: Diagnosis not present

## 2017-07-05 DIAGNOSIS — F17210 Nicotine dependence, cigarettes, uncomplicated: Secondary | ICD-10-CM

## 2017-07-05 DIAGNOSIS — Z736 Limitation of activities due to disability: Secondary | ICD-10-CM | POA: Diagnosis not present

## 2017-07-05 DIAGNOSIS — Z813 Family history of other psychoactive substance abuse and dependence: Secondary | ICD-10-CM | POA: Diagnosis not present

## 2017-07-05 DIAGNOSIS — F4310 Post-traumatic stress disorder, unspecified: Secondary | ICD-10-CM

## 2017-07-05 DIAGNOSIS — Z818 Family history of other mental and behavioral disorders: Secondary | ICD-10-CM | POA: Diagnosis not present

## 2017-07-05 DIAGNOSIS — Z810 Family history of intellectual disabilities: Secondary | ICD-10-CM | POA: Diagnosis not present

## 2017-07-05 DIAGNOSIS — Z811 Family history of alcohol abuse and dependence: Secondary | ICD-10-CM

## 2017-07-05 MED ORDER — TRAZODONE HCL 50 MG PO TABS: ORAL_TABLET | ORAL | 0 refills | Status: DC

## 2017-07-05 MED ORDER — BUSPIRONE HCL 10 MG PO TABS: 20.0000 mg | ORAL_TABLET | Freq: Three times a day (TID) | ORAL | 0 refills | Status: DC

## 2017-07-05 MED ORDER — DULOXETINE HCL 60 MG PO CPEP: 60.0000 mg | ORAL_CAPSULE | Freq: Every day | ORAL | 0 refills | Status: DC

## 2017-07-05 NOTE — Patient Instructions (Signed)
1. Continue duloxetine 60 mg daily  2. Continue buspar 20 mg three times a day  3. Discontinue gabapentin 4. Discontinue latuda 5. Start trazodone 25 - 50 mg at night as needed for sleep 6. She is not interested in therapy  7. Return to clinic in one month for 30 mins

## 2017-08-02 DIAGNOSIS — E1129 Type 2 diabetes mellitus with other diabetic kidney complication: Secondary | ICD-10-CM | POA: Diagnosis not present

## 2017-08-02 DIAGNOSIS — R809 Proteinuria, unspecified: Secondary | ICD-10-CM | POA: Diagnosis not present

## 2017-08-02 DIAGNOSIS — N179 Acute kidney failure, unspecified: Secondary | ICD-10-CM | POA: Diagnosis not present

## 2017-08-02 DIAGNOSIS — N182 Chronic kidney disease, stage 2 (mild): Secondary | ICD-10-CM | POA: Diagnosis not present
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
## 2017-08-08 ENCOUNTER — Ambulatory Visit (HOSPITAL_COMMUNITY): Payer: BLUE CROSS/BLUE SHIELD | Admitting: Psychiatry

## 2017-08-14 ENCOUNTER — Encounter (HOSPITAL_COMMUNITY): Payer: Self-pay | Admitting: Psychiatry

## 2017-08-14 ENCOUNTER — Telehealth (HOSPITAL_COMMUNITY): Payer: Self-pay | Admitting: Psychiatry

## 2017-08-14 ENCOUNTER — Ambulatory Visit (INDEPENDENT_AMBULATORY_CARE_PROVIDER_SITE_OTHER): Payer: BLUE CROSS/BLUE SHIELD | Admitting: Psychiatry

## 2017-08-14 VITALS — BP 109/77 | HR 79 | Ht 66.0 in | Wt 242.0 lb

## 2017-08-14 DIAGNOSIS — F0630 Mood disorder due to known physiological condition, unspecified: Secondary | ICD-10-CM | POA: Diagnosis not present

## 2017-08-14 DIAGNOSIS — F4310 Post-traumatic stress disorder, unspecified: Secondary | ICD-10-CM

## 2017-08-14 MED ORDER — BUSPIRONE HCL 10 MG PO TABS: 20.0000 mg | ORAL_TABLET | Freq: Three times a day (TID) | ORAL | 1 refills | Status: DC

## 2017-08-14 MED ORDER — TRAZODONE HCL 50 MG PO TABS: ORAL_TABLET | ORAL | 1 refills | Status: DC

## 2017-08-14 MED ORDER — DULOXETINE HCL 60 MG PO CPEP: 60.0000 mg | ORAL_CAPSULE | Freq: Every day | ORAL | 1 refills | Status: DC

## 2017-08-14 MED ORDER — LAMOTRIGINE 25 MG PO TABS: ORAL_TABLET | ORAL | 1 refills | Status: DC
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
## 2017-08-14 NOTE — Patient Instructions (Addendum)
1. Continue duloxetine 60 mg daily  2. Continue buspar 20 mg three times a day  3. Start lamotrigine 25 mg daily for two weeks, then 50 mg daily  4. Discontinue gabapentin  5. Continue trazodone 25 - 50 mg at night as needed for sleep 7. Return to clinic in two months for 30 mins 8. Referral to therapy  9. CONTACT INFORMATION  What to do if you need to get in touch with someone regarding a psychiatric issue:  1. EMERGENCY: For psychiatric emergencies (if you are suicidal or if there are any other safety issues) call 911 and/or go to your nearest Emergency Room immediately.   2. IF YOU NEED SOMEONE TO TALK TO RIGHT NOW: Given my clinical responsibilities, I may not be able to speak with you over the phone for a prolonged period of time.  a. You may always call The National Suicide Prevention Lifeline at 1-800-273-TALK (816)166-6907).  b. Your county of residence will also have local crisis services. For [facility]: [place] at [phone] ([place])

## 2017-08-14 NOTE — Telephone Encounter (Signed)
Please contact the pharmacy and discontinue orders from Dr. Hoyle Barr (previous psychiatrist). Please ask them to remove gabapentin from her pill pack.

## 2017-08-14 NOTE — Telephone Encounter (Signed)
DONE: contact the pharmacy and discontinue orders from Dr. Hoyle Barr (previous psychiatrist). Please ask them to remove gabapentin from her pill pack.

## 2017-08-16 DIAGNOSIS — E559 Vitamin D deficiency, unspecified: Secondary | ICD-10-CM | POA: Diagnosis not present

## 2017-08-16 DIAGNOSIS — Z1159 Encounter for screening for other viral diseases: Secondary | ICD-10-CM | POA: Diagnosis not present

## 2017-08-16 DIAGNOSIS — N183 Chronic kidney disease, stage 3 (moderate): Secondary | ICD-10-CM | POA: Diagnosis not present

## 2017-08-16 DIAGNOSIS — I129 Hypertensive chronic kidney disease with stage 1 through stage 4 chronic kidney disease, or unspecified chronic kidney disease: Secondary | ICD-10-CM | POA: Diagnosis not present

## 2017-08-23 ENCOUNTER — Ambulatory Visit (HOSPITAL_COMMUNITY)
Admission: RE | Admit: 2017-08-23 | Discharge: 2017-08-23 | Disposition: A | Discharge status: Home / Self Care | Payer: BLUE CROSS/BLUE SHIELD | Source: Ambulatory Visit | Attending: Gastroenterology | Admitting: Gastroenterology

## 2017-08-23 ENCOUNTER — Encounter: Payer: Self-pay | Admitting: Gastroenterology

## 2017-08-23 ENCOUNTER — Telehealth: Payer: Self-pay

## 2017-08-23 ENCOUNTER — Ambulatory Visit (INDEPENDENT_AMBULATORY_CARE_PROVIDER_SITE_OTHER): Payer: BLUE CROSS/BLUE SHIELD | Admitting: Gastroenterology

## 2017-08-23 VITALS — BP 120/76 | HR 66 | Temp 96.8°F | Ht 66.0 in | Wt 247.0 lb

## 2017-08-23 DIAGNOSIS — R0781 Pleurodynia: Secondary | ICD-10-CM | POA: Insufficient documentation

## 2017-08-23 DIAGNOSIS — K625 Hemorrhage of anus and rectum: Secondary | ICD-10-CM | POA: Diagnosis not present

## 2017-08-23 DIAGNOSIS — R079 Chest pain, unspecified: Secondary | ICD-10-CM | POA: Diagnosis not present

## 2017-08-23 DIAGNOSIS — K219 Gastro-esophageal reflux disease without esophagitis: Secondary | ICD-10-CM | POA: Diagnosis not present

## 2017-08-23 MED ORDER — NITROGLYCERIN 0.4 % RE OINT: 1.0000 [in_us] | TOPICAL_OINTMENT | Freq: Two times a day (BID) | RECTAL | 1 refills | Status: DC

## 2017-08-23 MED ORDER — HYDROCORTISONE 2.5 % RE CREA: 1.0000 "application " | TOPICAL_CREAM | Freq: Two times a day (BID) | RECTAL | 1 refills | Status: DC

## 2017-08-23 NOTE — Assessment & Plan Note (Signed)
Non-GI symptoms. Query musculoskeletal. Located left lower rib, cartilage. CXR to rule out other existing etiologies. Follow-up with PCP for further evaluation.

## 2017-08-23 NOTE — Progress Notes (Signed)
cc'ed to pcp °

## 2017-08-23 NOTE — Assessment & Plan Note (Signed)
Improved with Protonix BID. NO alarm signs.

## 2017-08-23 NOTE — Telephone Encounter (Signed)
0.2 % to be compounded.

## 2017-08-23 NOTE — Telephone Encounter (Signed)
Jennifer [name] from [facility] [phone]), the nitroglycerin ointment is not covered by the pt's insurance and would cost $785.89.  Please advise!

## 2017-08-23 NOTE — Patient Instructions (Addendum)
I have ordered the chest xray.   I have sent in more Anusol cream to use twice a day per rectum. Alternate this with the nitroglycerin ointment twice a day. Wear gloves when applying the nitro ointment and wash hands thoroughly thereafter. Watch for any dizziness, light-headedness. Do this for 2-3 weeks.  We will see you back in October to possibly do a hemorrhoid banding if you are agreeable to this.  Continue Protonix twice a day!  It was a pleasure to see you today. I strive to create trusting relationships with patients to provide genuine, compassionate, and quality care. I value your feedback. If you receive a survey regarding your visit,  I greatly appreciate you taking time to fill this out.   Annitta Needs, PhD, ANP-BC [facility]    Hemorrhoids Hemorrhoids are swollen veins in and around the rectum or anus. Hemorrhoids can cause pain, itching, or bleeding. Most of the time, they do not cause serious problems. They usually get better with diet changes, lifestyle changes, and other home treatments. Follow these instructions at home: Eating and drinking  Eat foods that have fiber, such as whole grains, beans, nuts, fruits, and vegetables. Ask your doctor about taking products that have added fiber (fibersupplements).  Drink enough fluid to keep your pee (urine) clear or pale yellow. For Pain and Swelling  Take a warm-water bath (sitz bath) for 20 minutes to ease pain. Do this 3-4 times a day.  If directed, put ice on the painful area. It may be helpful to use ice between your warm baths. ? Put ice in a plastic bag. ? Place a towel between your skin and the bag. ? Leave the ice on for 20 minutes, 2-3 times a day. General instructions  Take over-the-counter and prescription medicines only as told by your doctor. ? Medicated creams and medicines that are inserted into the anus (suppositories) may be used or applied as told.  Exercise often.  Go to the bathroom  when you have the urge to poop (to have a bowel movement). Do not wait.  Avoid pushing too hard (straining) when you poop.  Keep the butt area dry and clean. Use wet toilet paper or moist paper towels.  Do not sit on the toilet for a long time. Contact a doctor if:  You have any of these: ? Pain and swelling that do not get better with treatment or medicine. ? Bleeding that will not stop. ? Trouble pooping or you cannot poop. ? Pain or swelling outside the area of the hemorrhoids. This information is not intended to replace advice given to you by your health care provider. Make sure you discuss any questions you have with your health care provider. Document Released: 11/02/2007 Document Revised: 07/01/2015 Document Reviewed: 10/07/2014 Elsevier Interactive Patient Education  2018 [place] Fissure, Adult An anal fissure is a small tear or crack in the skin around the opening of the butt (anus).Bleeding from the tear or crack usually stops on its own within a few minutes. The bleeding may happen every time you poop (have a bowel movement) until the tear or crack heals. Follow these instructions at home: Eating and drinking  Avoid bananas and dairy products. These foods can make it hard to poop.  Drink enough fluid to keep your pee (urine) clear or pale yellow.  Eat a lot of fruit, whole grains, and vegetables. General instructions  Keep the butt area as clean and dry as you can.  Take  a warm water bath (sitz bath) as told by your doctor. Do not use soap.  Take over-the-counter and prescription medicines only as told by your doctor.  Use creams or ointments only as told by your doctor.  Keep all follow-up visits as told by your doctor. This is important. Contact a doctor if:  You have more bleeding.  You have a fever.  You have watery poop (diarrhea) that is mixed with blood.  You have pain.  You problem gets worse, not better. This information is not  intended to replace advice given to you by your health care provider. Make sure you discuss any questions you have with your health care provider. Document Released: 09/21/2010 Document Revised: 07/01/2015 Document Reviewed: 04/20/2014 Elsevier Interactive Patient Education  [employer].

## 2017-08-23 NOTE — Progress Notes (Signed)
Referring Provider: Sandi Mealy, MD Primary Care Physician:  Sandi Mealy, MD Primary GI: Dr. Gala Romney    Chief Complaint  Patient presents with  . Gastroesophageal Reflux  . Hemorrhoids    HPI:   Jennifer [name] is a 46 y.o. female presenting today with a history of constipation and GERD. Last seen in May 2019.   She was reporting dyspepsia at last visit. CT Feb 2019 on file reassuring. Protonix was increased to 40 mg BID. UGI in May 2019 showed small hiatal hernia, otherwise negative.   EGD October 2015, patulous EG junction, mild erosive reflux esophagitis.  Colonoscopy January 2013, external hemorrhoids but otherwise normal.   Has had 24 hour urinary collection recently, renal ultrasound. When coughing, states she had something "pop" out in her LUQ. She wonders if she had gallstones popping out. History of remote cholecystectomy. Left lower rib cage hurts. When she had the ultrasound, felt like a knot had gone over it. When moves a certain way, rib cage will start hurting. Saw some tiny bit of black sediment in urine.   Doing well on Protonix BID, helping reflux. No dysphagia. No constipation reported. Wiped last night and saw a scant amount of blood. Felt a little sharp pain when wiping. A circle "pops" out after using bathroom. Not wiping hard. Already used the Anusol cream. Used as needed. Itching stopped with cream. Sometimes a "cutting" when having a BM. Sitting for awhile on the toilet. Feels like stool is still hanging there.    Past Medical History:  Diagnosis Date  . Allergy   . Anemia   . Anxiety   . Arthritis    neck  . Asthma   . Diabetes mellitus without complication ([place])   . GERD (gastroesophageal reflux disease)   . History of flexible sigmoidoscopy 1999   Normal to 60cm,  . Hypertension   . Neuromuscular disorder ([place])   . Ovarian cancer ([place])    skin cancer  . Panic attack   . PTSD (post-traumatic stress disorder)   . S/P endoscopy Feb  2012   Nl, s/p 56-[facility] dilator, biopsy benign  . S/P endoscopy 2008   Mild gastritis  . Seizures ([place])   . Substance abuse ([place])    drug addiction    Past Surgical History:  Procedure Laterality Date  . ABDOMINAL HYSTERECTOMY     ovarian tumors  . CHOLECYSTECTOMY    . COLONOSCOPY  03/01/2011   Rourk-External hemorrhoids/otherwise normal rectum and colon.  . ESOPHAGOGASTRODUODENOSCOPY  2008   gastritis  . ESOPHAGOGASTRODUODENOSCOPY  2012   Moderate sized hiatal hernia, non-H. pylori gastritis  . ESOPHAGOGASTRODUODENOSCOPY N/A 11/17/2013   RMR: Mild erosive reflux esophagitis. Patulous EG junction  . HERNIA REPAIR     Incisional with mesh  . PARTIAL HYSTERECTOMY    . SKIN CANCER EXCISION     left bicep  . TONSILLECTOMY      Current Outpatient Medications  Medication Sig Dispense Refill  . busPIRone (BUSPAR) 10 MG tablet Take 2 tablets (20 mg total) by mouth 3 (three) times daily. 60 tablet 1  . cetirizine (ZYRTEC) 10 MG tablet Take 10 mg by mouth every morning.    . clobetasol cream (TEMOVATE) 3.01 % Apply 1 application topically as needed.    . doxycycline (VIBRA-TABS) 100 MG tablet Take 100 mg by mouth 2 (two) times daily.    . DULoxetine (CYMBALTA) 60 MG capsule Take 1 capsule (60 mg total) by mouth daily. 30 capsule  1  . fluticasone (FLONASE) 50 MCG/ACT nasal spray Place 1 spray into both nostrils daily.    . hydrocortisone (ANUSOL-HC) 2.5 % rectal cream Place 1 application rectally 2 (two) times daily. 30 g 1  . lamoTRIgine (LAMICTAL) 25 MG tablet 25 mg daily for two weeks, then 50 mg daily 60 tablet 1  . Levetiracetam 750 MG TB24 Take 750 mg by mouth at bedtime.  4  . metoCLOPramide (REGLAN) 5 MG tablet Take 5 mg by mouth 4 (four) times daily -  before meals and at bedtime.    Marland Kitchen oxybutynin (DITROPAN) 5 MG tablet Take 5 mg by mouth 2 (two) times daily.    . pantoprazole (PROTONIX) 40 MG tablet Take 1 tablet (40 mg total) by mouth 2 (two) times daily before a  meal. 60 tablet 5  . traZODone (DESYREL) 50 MG tablet 25-50 mg at night as needed for sleep 30 tablet 1  . Vitamin D, Ergocalciferol, (DRISDOL) 50000 units CAPS capsule Take 50,000 Units by mouth every 7 (seven) days.    Marland Kitchen aspirin EC 81 MG tablet Take 81 mg by mouth daily.     No current facility-administered medications for this visit.     Allergies as of 08/23/2017 - Review Complete 08/23/2017  Allergen Reaction Noted  . Metrizamide Rash and Swelling 10/17/2010  . Shellfish allergy Swelling 10/29/2013  . Codeine Other (See Comments)   . Hydrocodone Itching 12/03/2012  . Ivp dye [iodinated diagnostic agents] Rash 10/17/2010  . Latex Rash 03/08/2010    Family History  Problem Relation Age of Onset  . Crohn's disease Maternal Aunt   . Breast cancer Maternal Grandmother   . Cancer Maternal Grandmother   . Colon cancer Maternal Grandfather        85  . Pancreatic cancer Maternal Grandfather   . Cancer Maternal Grandfather   . Crohn's disease Cousin        x 2 cousins  . Breast cancer Daughter 21  . Arthritis Mother   . COPD Mother   . Depression Mother   . Drug abuse Mother   . Heart disease Mother   . Hypertension Mother   . Hyperlipidemia Mother   . Learning disabilities Mother   . Mental illness Mother   . Alcohol abuse Mother   . Early death Father 65       murder  . Alcohol abuse Father   . Drug abuse Father   . Learning disabilities Father   . Cancer Paternal Grandmother   . Alcohol abuse Paternal Grandmother   . Cancer Paternal Grandfather   . Drug abuse Maternal Uncle   . Alcohol abuse Paternal Uncle     Social History   Socioeconomic History  . Marital status: Married    Spouse name: Not on file  . Number of children: Not on file  . Years of education: Not on file  . Highest education level: Not on file  Occupational History  . Occupation: disabled    Fish farm manager: NOT EMPLOYED  Social Needs  . Financial resource strain: Not on file  . Food  insecurity:    Worry: Not on file    Inability: Not on file  . Transportation needs:    Medical: Not on file    Non-medical: Not on file  Tobacco Use  . Smoking status: Current Some Day Smoker    Packs/day: 0.25    Years: 26.00    Pack years: 6.50    Types: Cigarettes    Start  date: 05/03/1984  . Smokeless tobacco: Never Used  . Tobacco comment: 2 or 3 cigarettes some days/ doesn't smoke everyday  Substance and Sexual Activity  . Alcohol use: No    Alcohol/week: 0.0 oz  . Drug use: No  . Sexual activity: Yes    Birth control/protection: Surgical  Lifestyle  . Physical activity:    Days per week: Not on file    Minutes per session: Not on file  . Stress: Not on file  Relationships  . Social connections:    Talks on phone: Not on file    Gets together: Not on file    Attends religious service: Not on file    Active member of club or organization: Not on file    Attends meetings of clubs or organizations: Not on file    Relationship status: Not on file  Other Topics Concern  . Not on file  Social History Narrative  . Not on file    Review of Systems: As mentioned in HPI   Physical Exam: BP 120/76   Pulse 66   Temp (!) 96.8 F (36 C) (Oral)   Ht 5\' 6"  (1.676 m)   Wt 247 lb (112 kg)   BMI 39.87 kg/m  General:   Alert and oriented. No distress noted. Pleasant and cooperative.  Head:  Normocephalic and atraumatic. Eyes:  Conjuctiva clear without scleral icterus. Mouth:  Oral mucosa pink and moist.  Abdomen:  +BS, soft, non-tender and non-distended. No rebound or guarding. No HSM or masses noted. Point tenderness left lateral rib Rectal: No obvious external hemorrhoid. No obvious fissure. Discomfort just with slight insertion of finger. Possible internal hemorrhoid posteriorly. Msk:  Symmetrical without gross deformities. Normal posture. Extremities:  Without edema. Neurologic:  Alert and  oriented x4 Psych:  Alert and cooperative. Normal mood and affect.

## 2017-08-23 NOTE — Telephone Encounter (Signed)
Please find out if any alternative. We may be able to get from [facility] cheaper or compounded.

## 2017-08-23 NOTE — Telephone Encounter (Signed)
Jennifer [name] spoke to the pharmacist and they can compound at [facility] by using 2% instead of 4%.

## 2017-08-23 NOTE — Assessment & Plan Note (Signed)
Scant hematochezia with wiping. Notes pain with defection. Rectal exam without obvious anal fissure, query internal hemorrhoid. Unable to exclude occult fissure coexisting. Start nitro ointment BID for 2-3 weeks, alternating with Anusol. Return 10/31 to reassess, possible ?hemorrhoid banding. May need updated colonoscopy if no improvement.

## 2017-08-24 ENCOUNTER — Telehealth: Payer: Self-pay | Admitting: Gastroenterology

## 2017-08-24 NOTE — Progress Notes (Signed)
CXR normal. Please follow-up with PCP for rib pain.

## 2017-08-24 NOTE — Telephone Encounter (Signed)
Pt was returning a call to DS. Please call her back [phone]

## 2017-08-24 NOTE — Progress Notes (Signed)
Pt is aware.  

## 2017-08-24 NOTE — Progress Notes (Signed)
Tried to call and Vm not set up.

## 2017-08-27 NOTE — Telephone Encounter (Signed)
PT was made aware of her xray results on Friday. See imaging on 08/23/2017.

## 2017-08-28 DIAGNOSIS — N182 Chronic kidney disease, stage 2 (mild): Secondary | ICD-10-CM | POA: Diagnosis not present

## 2017-08-28 DIAGNOSIS — I10 Essential (primary) hypertension: Secondary | ICD-10-CM | POA: Diagnosis not present

## 2017-08-28 DIAGNOSIS — E1129 Type 2 diabetes mellitus with other diabetic kidney complication: Secondary | ICD-10-CM | POA: Diagnosis not present

## 2017-08-30 ENCOUNTER — Ambulatory Visit: Payer: BLUE CROSS/BLUE SHIELD | Admitting: Gastroenterology

## 2017-09-06 DIAGNOSIS — G40409 Other generalized epilepsy and epileptic syndromes, not intractable, without status epilepticus: Secondary | ICD-10-CM | POA: Diagnosis not present

## 2017-09-06 DIAGNOSIS — M62838 Other muscle spasm: Secondary | ICD-10-CM | POA: Diagnosis not present

## 2017-09-06 DIAGNOSIS — Z79899 Other long term (current) drug therapy: Secondary | ICD-10-CM | POA: Diagnosis not present

## 2017-09-06 DIAGNOSIS — M542 Cervicalgia: Secondary | ICD-10-CM | POA: Diagnosis not present

## 2017-09-17 ENCOUNTER — Other Ambulatory Visit (HOSPITAL_COMMUNITY): Payer: Self-pay | Admitting: Psychiatry

## 2017-09-17 MED ORDER — LAMOTRIGINE 25 MG PO TABS: 50.0000 mg | ORAL_TABLET | Freq: Every day | ORAL | 1 refills | Status: DC

## 2017-09-20 DIAGNOSIS — M62838 Other muscle spasm: Secondary | ICD-10-CM | POA: Diagnosis not present

## 2017-09-20 DIAGNOSIS — M542 Cervicalgia: Secondary | ICD-10-CM | POA: Diagnosis not present

## 2017-09-20 DIAGNOSIS — Z79899 Other long term (current) drug therapy: Secondary | ICD-10-CM | POA: Diagnosis not present

## 2017-09-20 DIAGNOSIS — E1142 Type 2 diabetes mellitus with diabetic polyneuropathy: Secondary | ICD-10-CM | POA: Diagnosis not present

## 2017-09-21 ENCOUNTER — Other Ambulatory Visit (HOSPITAL_COMMUNITY): Payer: Self-pay | Admitting: Psychiatry

## 2017-09-21 MED ORDER — BUSPIRONE HCL 10 MG PO TABS: 20.0000 mg | ORAL_TABLET | Freq: Three times a day (TID) | ORAL | 1 refills | Status: DC
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
## 2017-10-15 ENCOUNTER — Ambulatory Visit (INDEPENDENT_AMBULATORY_CARE_PROVIDER_SITE_OTHER): Payer: BLUE CROSS/BLUE SHIELD | Admitting: Psychiatry

## 2017-10-15 ENCOUNTER — Encounter (HOSPITAL_COMMUNITY): Payer: Self-pay | Admitting: Psychiatry

## 2017-10-15 VITALS — BP 124/80 | HR 92 | Ht 66.0 in | Wt 237.0 lb

## 2017-10-15 DIAGNOSIS — F419 Anxiety disorder, unspecified: Secondary | ICD-10-CM

## 2017-10-15 DIAGNOSIS — Z818 Family history of other mental and behavioral disorders: Secondary | ICD-10-CM

## 2017-10-15 DIAGNOSIS — F4310 Post-traumatic stress disorder, unspecified: Secondary | ICD-10-CM | POA: Diagnosis not present

## 2017-10-15 DIAGNOSIS — Z813 Family history of other psychoactive substance abuse and dependence: Secondary | ICD-10-CM

## 2017-10-15 DIAGNOSIS — F0630 Mood disorder due to known physiological condition, unspecified: Secondary | ICD-10-CM

## 2017-10-15 DIAGNOSIS — Z736 Limitation of activities due to disability: Secondary | ICD-10-CM

## 2017-10-15 DIAGNOSIS — Z560 Unemployment, unspecified: Secondary | ICD-10-CM

## 2017-10-15 DIAGNOSIS — G4700 Insomnia, unspecified: Secondary | ICD-10-CM

## 2017-10-15 MED ORDER — DULOXETINE HCL 60 MG PO CPEP: 60.0000 mg | ORAL_CAPSULE | Freq: Every day | ORAL | 2 refills | Status: DC

## 2017-10-15 MED ORDER — TRAZODONE HCL 50 MG PO TABS: ORAL_TABLET | ORAL | 2 refills | Status: DC

## 2017-10-15 MED ORDER — BUSPIRONE HCL 10 MG PO TABS: 20.0000 mg | ORAL_TABLET | Freq: Three times a day (TID) | ORAL | 2 refills | Status: DC

## 2017-10-15 MED ORDER — LAMOTRIGINE 100 MG PO TABS: 100.0000 mg | ORAL_TABLET | Freq: Every day | ORAL | 2 refills | Status: DC

## 2017-10-15 NOTE — Patient Instructions (Signed)
1. Continue duloxetine 60 mg daily  2. Continue Buspar 20 mg three times a day 3. Increase lamotrigine 100 mg daily  4. Continuetrazodone 25 - 50 mg at night as needed for sleep 5.Return to clinic in three months for 15 mins 6. Referral to therapy

## 2017-10-22 DIAGNOSIS — Z1231 Encounter for screening mammogram for malignant neoplasm of breast: Secondary | ICD-10-CM | POA: Diagnosis not present

## 2017-10-22 DIAGNOSIS — Z23 Encounter for immunization: Secondary | ICD-10-CM | POA: Diagnosis not present

## 2017-10-22 DIAGNOSIS — Z6838 Body mass index (BMI) 38.0-38.9, adult: Secondary | ICD-10-CM | POA: Diagnosis not present

## 2017-10-22 DIAGNOSIS — F200 Paranoid schizophrenia: Secondary | ICD-10-CM | POA: Diagnosis not present

## 2017-10-22 DIAGNOSIS — M25552 Pain in left hip: Secondary | ICD-10-CM | POA: Diagnosis not present

## 2017-11-05 ENCOUNTER — Telehealth: Payer: Self-pay | Admitting: Internal Medicine

## 2017-11-05 NOTE — Telephone Encounter (Signed)
She needs to be using the nitro cream that was sent in BID, alternating with Anusol. Wear gloves when applying and wash hands thoroughly thereafter. She can come by and pick up Linzess 145 mcg to take once each morning, 30 minutes before breakfast. May need to increase this to 290 mcg daily.  Concern for occult anal fissure, so aggressive treatment prior to any banding is the goal here.

## 2017-11-05 NOTE — Telephone Encounter (Signed)
Spoke with pt. She is having c/o constipation with some bleeding only when having the bowel movement. Pt has hx of hemorrhoids. She is using Anusol cr for the hemorrhoids and bleeding when needed and taking Miralax twice daily. Pt isn't taking a prescription medication and would like something to help her have a bowel movement. Pt has also increased her water intake.

## 2017-11-05 NOTE — Telephone Encounter (Signed)
Pt was given instructions on how to alternate creams. Pt is going to pick up samples of Linzess 151mcg. Samples are ready for pickup.

## 2017-11-05 NOTE — Telephone Encounter (Signed)
Pt is scheduled a FU and banding with AB on 10/31. Pt said she isn't able to have a BM and it's been 4 days and when she tries to go she can feel her skin tearing back there. She is asking if there's anything AB could call into [facility] to help her go to the bathroom, Please advise. [phone]

## 2017-11-13 ENCOUNTER — Encounter (HOSPITAL_COMMUNITY): Payer: Self-pay | Admitting: Psychiatry

## 2017-11-13 ENCOUNTER — Ambulatory Visit (INDEPENDENT_AMBULATORY_CARE_PROVIDER_SITE_OTHER): Payer: BLUE CROSS/BLUE SHIELD | Admitting: Psychiatry

## 2017-11-13 DIAGNOSIS — F0630 Mood disorder due to known physiological condition, unspecified: Secondary | ICD-10-CM | POA: Diagnosis not present

## 2017-11-13 DIAGNOSIS — F4310 Post-traumatic stress disorder, unspecified: Secondary | ICD-10-CM
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
## 2017-11-15 ENCOUNTER — Encounter

## 2017-11-15 ENCOUNTER — Encounter: Payer: Self-pay | Admitting: *Deleted

## 2017-11-15 ENCOUNTER — Encounter: Payer: Self-pay | Admitting: Physician Assistant

## 2017-11-15 ENCOUNTER — Ambulatory Visit (INDEPENDENT_AMBULATORY_CARE_PROVIDER_SITE_OTHER): Payer: BLUE CROSS/BLUE SHIELD | Admitting: Physician Assistant

## 2017-11-15 VITALS — BP 122/86 | HR 68 | Ht 66.0 in | Wt 240.0 lb

## 2017-11-15 DIAGNOSIS — I10 Essential (primary) hypertension: Secondary | ICD-10-CM | POA: Diagnosis not present

## 2017-11-15 DIAGNOSIS — R0600 Dyspnea, unspecified: Secondary | ICD-10-CM

## 2017-11-15 DIAGNOSIS — R0789 Other chest pain: Secondary | ICD-10-CM

## 2017-11-15 DIAGNOSIS — R0609 Other forms of dyspnea: Secondary | ICD-10-CM

## 2017-11-15 NOTE — Progress Notes (Signed)
Cardiology Office Note   Date:  11/15/2017   ID:  Jennifer [name], DOB May 28, 1970, MRN [id number]  PCP:  Sandi Mealy, MD Cardiologist:  [name], MD 2017 Rosaria Ferries, PA-C    History of Present Illness: Jennifer [name] is a 47 y.o. female with a history of palpitations and exertional dyspnea, low risk MV 2015 w/ EF 63%, anxiety, anemia, DM, HTN, GERD, ovarian CA, drug addiction, PTSD.  Jennifer [name] presents for cardiology evaluation.  She has sharp pain that is mid-sternal, on the right side. It hurts worse to breathe. Will get it 2-3 x a week. 10/10.  It will last for a couple of minutes and then will ease up. No rx tried. It has started w/ sitting, standing and laying down. No consistent exertional component, but did once when she was in an argument.   Her heart will race when she walks. In the office, she was feeling her heart racing and her HR was 110, regular. However, she only had to walk about 25 feet for her HR to get this high.   She gets light-headed when she changes position.  Things such as bending over and then standing up or standing up out of her chair will make her lightheaded.  It passes quickly.  She drinks plenty of water, 4-5 bottles a day. She is thirsty as a side effect of her medications. Rarely has pedal edema on waking.   Her anxiety is not well-controlled. She is seeing a therapist and is on medications.  She does not like crowds, does not go to grocery stores or other stores because there are too many people around.  She will get a panic attack if she does.   Past Medical History:  Diagnosis Date  . Allergy   . Anemia   . Anxiety   . Arthritis    neck  . Asthma   . Diabetes mellitus without complication ([place])   . GERD (gastroesophageal reflux disease)   . History of flexible sigmoidoscopy 1999   Normal to 60cm,  . Hypertension   . Neuromuscular disorder ([place])   . Ovarian cancer ([place])    skin cancer  . Panic attack   . PTSD  (post-traumatic stress disorder)   . S/P endoscopy Feb 2012   Nl, s/p 56-[place] [facility] dilator, biopsy benign  . S/P endoscopy 2008   Mild gastritis  . Seizures ([place])   . Substance abuse ([place])    drug addiction    Past Surgical History:  Procedure Laterality Date  . ABDOMINAL HYSTERECTOMY     ovarian tumors  . CHOLECYSTECTOMY    . COLONOSCOPY  03/01/2011   [place]-External hemorrhoids/otherwise normal rectum and colon.  . ESOPHAGOGASTRODUODENOSCOPY  2008   gastritis  . ESOPHAGOGASTRODUODENOSCOPY  2012   Moderate sized hiatal hernia, non-H. pylori gastritis  . ESOPHAGOGASTRODUODENOSCOPY N/A 11/17/2013   RMR: Mild erosive reflux esophagitis. Patulous EG junction  . HERNIA REPAIR     Incisional with mesh  . PARTIAL HYSTERECTOMY    . SKIN CANCER EXCISION     left bicep  . TONSILLECTOMY      Current Outpatient Medications  Medication Sig Dispense Refill  . busPIRone (BUSPAR) 10 MG tablet Take 2 tablets (20 mg total) by mouth 3 (three) times daily. 90 tablet 2  . cetirizine (ZYRTEC) 10 MG tablet Take 10 mg by mouth every morning.    . clobetasol cream (TEMOVATE) 4.09 % Apply 1 application topically as needed.    Marland Kitchen  DULoxetine (CYMBALTA) 60 MG capsule Take 1 capsule (60 mg total) by mouth daily. 30 capsule 2  . fluticasone (FLONASE) 50 MCG/ACT nasal spray Place 1 spray into both nostrils daily.    . hydrocortisone (ANUSOL-HC) 2.5 % rectal cream Place 1 application rectally 2 (two) times daily. 30 g 1  . lamoTRIgine (LAMICTAL) 100 MG tablet Take 1 tablet (100 mg total) by mouth daily. 30 tablet 2  . Levetiracetam 750 MG TB24 Take 750 mg by mouth at bedtime.  4  . metoCLOPramide (REGLAN) 5 MG tablet Take 5 mg by mouth 4 (four) times daily -  before meals and at bedtime.    . Nitroglycerin 0.4 % OINT Place 1 inch rectally 2 (two) times daily. Alternate with anusol. Wear gloves, wash hands after. 1 Tube 1  . oxybutynin (DITROPAN) 5 MG tablet Take 5 mg by mouth 2 (two) times daily.    .  pantoprazole (PROTONIX) 40 MG tablet Take 1 tablet (40 mg total) by mouth 2 (two) times daily before a meal. 60 tablet 5  . traZODone (DESYREL) 50 MG tablet 25-50 mg at night as needed for sleep 30 tablet 2  . Vitamin D, Ergocalciferol, (DRISDOL) 50000 units CAPS capsule Take 50,000 Units by mouth every 7 (seven) days.     No current facility-administered medications for this visit.     Allergies:   Metrizamide; Shellfish allergy; Codeine; Hydrocodone; Ivp dye [iodinated diagnostic agents]; and Latex    Social History:  The patient  reports that she has been smoking cigarettes. She started smoking about 33 years ago. She has a 39.00 pack-year smoking history. She has never used smokeless tobacco. She reports that she drinks about 1.0 standard drinks of alcohol per week. She reports that she does not use drugs.   Family History:  The patient's family history includes Alcohol abuse in her father, maternal uncle, mother, paternal grandmother, and paternal uncle; Arthritis in her mother; Breast cancer in her maternal grandmother; Breast cancer (age of onset: 46) in her daughter; COPD in her mother; Cancer in her maternal grandfather, maternal grandmother, paternal grandfather, and paternal grandmother; Colon cancer in her maternal grandfather; Crohn's disease in her cousin and maternal aunt; Depression in her mother; Drug abuse in her father, mother, and paternal uncle; Early death (age of onset: 40) in her father; Heart disease in her mother; Hyperlipidemia in her mother; Hypertension in her mother; Learning disabilities in her father and mother; Mental illness in her mother; Pancreatic cancer in her maternal grandfather.  She indicated that her mother is alive. She indicated that her father is deceased. She indicated that her maternal grandmother is deceased. She indicated that her maternal grandfather is deceased. She indicated that her paternal grandmother is deceased. She indicated that her paternal  grandfather is deceased. She indicated that only one of her two daughters is alive. She indicated that her maternal aunt is alive. She indicated that the status of her maternal uncle is unknown. She indicated that the status of her paternal uncle is unknown. She indicated that her cousin is alive.   ROS:  Please see the history of present illness. All other systems are reviewed and negative.    PHYSICAL EXAM: VS:  BP 122/86   Pulse 68   Ht 5\' 6"  (1.676 m)   Wt 240 lb (108.9 kg)   SpO2 97%   BMI 38.74 kg/m  , BMI Body mass index is 38.74 kg/m. GEN: Well nourished, well developed, female in no acute distress HEENT:  normal for age  Neck: no JVD, no carotid bruit, no masses Cardiac: RRR; no murmur, no rubs, or gallops Respiratory:  clear to auscultation bilaterally, normal work of breathing GI: soft, nontender, nondistended, + BS MS: no deformity or atrophy; no edema; distal pulses are 2+ in all 4 extremities  Skin: warm and dry, no rash Neuro:  Strength and sensation are intact Psych: euthymic mood, full affect   EKG:  EKG is ordered today. The ekg ordered today demonstrates sinus rhythm, normal intervals, low voltage in precordial leads which is slightly different from 11/2016 ECG  ECHO: 2013 Stress echo was low risk  MYOVIEW: 04/10/2013 Analysis of the overall perfusion data finds significant breast attenuation artifact.  Tomographic views were obtained using the short axis, vertical long axis, and horizontal long axis planes. There is a small, moderate intensity, fixed anterior defect that is most prominent at rest rather than stress. This is consistent with soft tissue attenuation rather than scar. No clearly ischemic defects are noted.  Gated imaging reveals an EDV of 91, ESV of 34, TID ratio of 0.92, and LVEF of 63% without wall motion abnormalities.  IMPRESSION: Low risk Lexiscan Cardiolite. There were no diagnostic ST segment abnormalities, and no arrhythmias  were noted. Significant breast attenuation is seen, otherwise no clear evidence of scar or ischemia. LVEF normal 63% with normal volumes.   Recent Labs: 02/15/2017: TSH 2.83  CBC    Component Value Date/Time   WBC 5.9 10/12/2016 1517   RBC 4.85 10/12/2016 1517   HGB 11.1 (L) 10/12/2016 1517   HCT 36.4 10/12/2016 1517   PLT 258 10/12/2016 1517   MCV 75.1 (L) 10/12/2016 1517   MCH 22.9 (L) 10/12/2016 1517   MCHC 30.5 (L) 10/12/2016 1517   RDW 15.1 (H) 10/12/2016 1517   LYMPHSABS 3.2 06/01/2015 0441   MONOABS 0.5 06/01/2015 0441   EOSABS 0.1 06/01/2015 0441   BASOSABS 0.0 06/01/2015 0441   CMP Latest Ref Rng & Units 10/12/2016 06/01/2015 12/07/2014  Glucose 65 - 99 mg/dL 83 113(H) 96  BUN 7 - 25 mg/dL 18 12 16   Creatinine 0.50 - 1.10 mg/dL 1.09 1.04(H) 1.09  Sodium 135 - 146 mmol/L 139 142 143  Potassium 3.5 - 5.3 mmol/L 4.3 3.6 4.4  Chloride 98 - 110 mmol/L 108 109 108  CO2 20 - 32 mmol/L 28 23 26   Calcium 8.6 - 10.2 mg/dL 9.2 9.3 9.6  Total Protein 6.1 - 8.1 g/dL 6.7 7.5 7.2  Total Bilirubin 0.2 - 1.2 mg/dL 0.5 0.2(L) 0.5  Alkaline Phos 38 - 126 U/L - 66 71  AST 10 - 35 U/L 11 20 15   ALT 6 - 29 U/L 8 12(L) 11     Lipid Panel    Component Value Date/Time   CHOL 158 10/12/2016 1517   TRIG 65 10/12/2016 1517   HDL 47 (L) 10/12/2016 1517   CHOLHDL 3.4 10/12/2016 1517   LDLCALC 96 10/12/2016 1517     Wt Readings from Last 3 Encounters:  11/15/17 240 lb (108.9 kg)  08/23/17 247 lb (112 kg)  06/06/17 243 lb 9.6 oz (110.5 kg)     Other studies Reviewed: Additional studies/ records that were reviewed today include: Office notes, hospital records and testing.  ASSESSMENT AND PLAN:  1.  Chest pain: Symptoms are atypical and I explained that they were more consistent with a cramping or other type of musculoskeletal pain and cardiac pain.  They do not last long enough for any medication to help.  2.  Dyspnea on exertion: According to the patient, this is much worse than  it used to be.  Her activity level is very low, however, she does have dyspnea on exertion and sinus tachycardia with minimal exertion. -Check a Lexiscan Myoview -When she comes for the Myoview, check a d-dimer.  3.  Hypertension: Her blood pressures well controlled on current therapy, no med changes.  Current medicines are reviewed at length with the patient today.  The patient does not have concerns regarding medicines.  The following changes have been made:  no change  Labs/ tests ordered today include:   Orders Placed This Encounter  Procedures  . NM Myocar Multi W/Spect W/Wall Motion / EF  . EKG 12-Lead     Disposition:   FU with [name], MD  Signed, Rosaria Ferries, PA-C  11/15/2017 4:47 PM    [place] Phone: [phone]; Fax: [phone]  This note was written with the assistance of speech recognition software.  Please excuse any transcriptional errors.

## 2017-11-15 NOTE — Patient Instructions (Signed)
Medication Instructions:  Your physician recommends that you continue on your current medications as directed. Please refer to the Current Medication list given to you today.  If you need a refill on your cardiac medications before your next appointment, please call your pharmacy.   Lab work: NONE If you have labs (blood work) drawn today and your tests are completely normal, you will receive your results only by: Marland Kitchen MyChart Message (if you have MyChart) OR . A paper copy in the mail If you have any lab test that is abnormal or we need to change your treatment, we will call you to review the results.  Testing/Procedures: Your physician has requested that you have a lexiscan myoview. For further information please visit [email or web address]. Please follow instruction sheet, as given.  Follow-Up: At [facility], you and your health needs are our priority.  As part of our continuing mission to provide you with exceptional heart care, we have created designated Provider Care Teams.  These Care Teams include your primary Cardiologist (physician) and Advanced Practice Providers (APPs -  Physician Assistants and Nurse Practitioners) who all work together to provide you with the care you need, when you need it. You will need a follow up appointment after your stress test .  Please call our office 2 months in advance to schedule this appointment.  You may see [name], MD or one of the following Advanced Practice Providers on your designated Care Team:   Bernerd Pho, PA-C [facility]) . Ermalinda Barrios, PA-C ([place])  Any Other Special Instructions Will Be Listed Below (If Applicable). Thank you for choosing [facility]!

## 2017-11-19 ENCOUNTER — Ambulatory Visit (HOSPITAL_COMMUNITY)
Admission: RE | Admit: 2017-11-19 | Discharge: 2017-11-19 | Disposition: A | Discharge status: Home / Self Care | Payer: BLUE CROSS/BLUE SHIELD | Source: Ambulatory Visit | Attending: Physician Assistant | Admitting: Physician Assistant

## 2017-11-19 ENCOUNTER — Encounter (HOSPITAL_BASED_OUTPATIENT_CLINIC_OR_DEPARTMENT_OTHER)
Admission: RE | Admit: 2017-11-19 | Discharge: 2017-11-19 | Disposition: A | Discharge status: Home / Self Care | Payer: BLUE CROSS/BLUE SHIELD | Source: Ambulatory Visit | Attending: Physician Assistant | Admitting: Physician Assistant

## 2017-11-19 DIAGNOSIS — R0789 Other chest pain: Secondary | ICD-10-CM

## 2017-11-19 LAB — NM MYOCAR MULTI W/SPECT W/WALL MOTION / EF
CHL CUP NUCLEAR SDS: 2
CHL CUP RESTING HR STRESS: 53 {beats}/min
CSEPPHR: 107 {beats}/min
LVDIAVOL: 99 mL (ref 46–106)
LVSYSVOL: 35 mL
RATE: 0.23
SRS: 1
SSS: 3
TID: 0.94

## 2017-11-19 MED ORDER — SODIUM CHLORIDE 0.9% FLUSH
INTRAVENOUS | Status: AC
  Administered 2017-11-19: 10 mL via INTRAVENOUS
  Filled 2017-11-19: qty 100

## 2017-11-19 MED ORDER — REGADENOSON 0.4 MG/5ML IV SOLN
INTRAVENOUS | Status: AC
  Filled 2017-11-19: qty 5

## 2017-11-19 MED ORDER — SODIUM CHLORIDE 0.9% FLUSH
INTRAVENOUS | Status: AC
  Administered 2017-11-19: 10 mL via INTRAVENOUS
  Filled 2017-11-19: qty 10

## 2017-11-19 MED ORDER — TECHNETIUM TC 99M TETROFOSMIN IV KIT
10.0000 | PACK | Freq: Once | INTRAVENOUS | Status: AC | PRN
  Administered 2017-11-19: 10.3 via INTRAVENOUS

## 2017-11-19 MED ORDER — TECHNETIUM TC 99M TETROFOSMIN IV KIT
30.0000 | PACK | Freq: Once | INTRAVENOUS | Status: AC | PRN
  Administered 2017-11-19: 30 via INTRAVENOUS

## 2017-11-23 ENCOUNTER — Ambulatory Visit: Payer: BLUE CROSS/BLUE SHIELD | Admitting: Gastroenterology

## 2017-12-06 ENCOUNTER — Encounter: Payer: Self-pay | Admitting: Gastroenterology

## 2017-12-06 ENCOUNTER — Ambulatory Visit (INDEPENDENT_AMBULATORY_CARE_PROVIDER_SITE_OTHER): Payer: BLUE CROSS/BLUE SHIELD | Admitting: Gastroenterology

## 2017-12-06 VITALS — BP 131/76 | HR 75 | Temp 97.2°F | Ht 66.0 in | Wt 239.8 lb

## 2017-12-06 DIAGNOSIS — K642 Third degree hemorrhoids: Secondary | ICD-10-CM | POA: Diagnosis not present

## 2017-12-06 NOTE — Patient Instructions (Signed)
Avoid straining.  Benefiber 2 teaspoons twice daily: I have included a handout for you on the best place to purchase these.   Limit toilet time to 2-3 minutes  Call with any interim problems  Schedule followup appointment in 2-3 weeks from now, and we will reassess!  Please call if you need anything.  I enjoyed seeing you again today! As you know, I value our relationship and want to provide genuine, compassionate, and quality care. I welcome your feedback. If you receive a survey regarding your visit,  I greatly appreciate you taking time to fill this out. See you next time!  Annitta Needs, PhD, ANP-BC [facility]

## 2017-12-06 NOTE — Progress Notes (Signed)
[facility] Banding Note:  Pleasant 47 year old female presenting with reports of prolapsing hemorrhoids, able to manually reduce but does not reduce spontaneously, right sided sensation of protrusion, no rectal bleeding. Itching noted.    The patient presents with symptomatic grade 3 hemorrhoids, unresponsive to maximal medical therapy, requesting rubber band ligation of her hemorrhoidal disease. All risks, benefits, and alternative forms of therapy were described and informed consent was obtained.   The decision was made to band the RIGHT ANTERIOR  internal hemorrhoid, and the [place] was used to perform band ligation without complication. Digital anorectal examination was then performed to assure proper positioning of the band, and to adjust the banded tissue as required. The patient was discharged home without pain or other issues. Dietary and behavioral recommendations were given, along with follow-up instructions. The patient will return in November  for followup and possible additional banding as required.  No complications were encountered and the patient tolerated the procedure well  Annitta Needs, PhD, [facility]

## 2017-12-12 ENCOUNTER — Other Ambulatory Visit (HOSPITAL_COMMUNITY): Payer: Self-pay | Admitting: Psychiatry

## 2017-12-12 MED ORDER — LAMOTRIGINE 100 MG PO TABS: 100.0000 mg | ORAL_TABLET | Freq: Every day | ORAL | 0 refills | Status: DC

## 2017-12-12 MED ORDER — TRAZODONE HCL 50 MG PO TABS: ORAL_TABLET | ORAL | 0 refills | Status: DC

## 2017-12-12 MED ORDER — BUSPIRONE HCL 10 MG PO TABS: 20.0000 mg | ORAL_TABLET | Freq: Three times a day (TID) | ORAL | 0 refills | Status: DC

## 2017-12-12 MED ORDER — DULOXETINE HCL 60 MG PO CPEP: 60.0000 mg | ORAL_CAPSULE | Freq: Every day | ORAL | 0 refills | Status: DC

## 2017-12-26 ENCOUNTER — Encounter: Payer: Self-pay | Admitting: Gastroenterology

## 2017-12-26 ENCOUNTER — Ambulatory Visit (INDEPENDENT_AMBULATORY_CARE_PROVIDER_SITE_OTHER): Payer: BLUE CROSS/BLUE SHIELD | Admitting: Gastroenterology

## 2017-12-26 VITALS — BP 127/86 | HR 68 | Temp 97.5°F | Ht 66.0 in | Wt 237.8 lb

## 2017-12-26 DIAGNOSIS — K642 Third degree hemorrhoids: Secondary | ICD-10-CM

## 2017-12-26 NOTE — Patient Instructions (Signed)
Avoid straining.  Benefiber 2 teaspoons twice daily  Limit toilet time to 2-3 minutes  Call with any interim problems  Schedule followup appointment in 4 weeks from now.   Please start using the nitro cream 4 times per day per rectum, wearing gloves. Please let me know if you need a refill on this.   It was a pleasure to see you today. I strive to create trusting relationships with patients to provide genuine, compassionate, and quality care. I value your feedback. If you receive a survey regarding your visit,  I greatly appreciate you taking time to fill this out.   Annitta Needs, PhD, ANP-BC [facility]

## 2017-12-26 NOTE — Progress Notes (Signed)
[facility] Banding Note:   The patient presents with symptomatic grade 3 hemorrhoids, unresponsive to maximal medical therapy, requesting rubber band ligation of her hemorrhoidal disease. Symptoms including sensation of protrusion, inadequate evacuation, pushing hemorrhoids back in, itching. Some rectal discomfort with bowel movements at times. All risks, benefits, and alternative forms of therapy were described and informed consent was obtained.  In the left lateral decubitus position anoscopic examination revealed grade 3 hemorrhoids in the right posterior and left lateral positions. Previously banded the right anterior. Decision was made to band the right posterior. Likely occult anal fissure co-existing, as  posterior midline with mild discomfort. No rectal bleeding.   The decision was made to band the right posterior internal hemorrhoid, and the [place] was used to perform band ligation without complication. Digital anorectal examination was then performed to assure proper positioning of the band, and to adjust the banded tissue as required. The patient was discharged home without pain or other issues. Dietary and behavioral recommendations were given and recommending the nitro cream BID due to concern for possible occult anal fissure. Will hold off on left lateral until sure we have treated possible fissure for adequate time. She is to return in approximately 4 weeks.  No complications were encountered and the patient tolerated the procedure well  Jennifer Needs, PhD, [facility] Gastroenterology

## 2017-12-27 ENCOUNTER — Ambulatory Visit: Payer: Self-pay | Admitting: Student

## 2018-01-02 DIAGNOSIS — Z885 Allergy status to narcotic agent status: Secondary | ICD-10-CM | POA: Diagnosis not present

## 2018-01-02 DIAGNOSIS — Z91040 Latex allergy status: Secondary | ICD-10-CM | POA: Diagnosis not present

## 2018-01-02 DIAGNOSIS — K589 Irritable bowel syndrome without diarrhea: Secondary | ICD-10-CM | POA: Diagnosis not present

## 2018-01-02 DIAGNOSIS — Z91013 Allergy to seafood: Secondary | ICD-10-CM | POA: Diagnosis not present

## 2018-01-02 DIAGNOSIS — F319 Bipolar disorder, unspecified: Secondary | ICD-10-CM | POA: Diagnosis not present

## 2018-01-02 DIAGNOSIS — F17210 Nicotine dependence, cigarettes, uncomplicated: Secondary | ICD-10-CM | POA: Diagnosis not present

## 2018-01-02 DIAGNOSIS — T63331A Toxic effect of venom of brown recluse spider, accidental (unintentional), initial encounter: Secondary | ICD-10-CM | POA: Diagnosis not present

## 2018-01-02 DIAGNOSIS — J45909 Unspecified asthma, uncomplicated: Secondary | ICD-10-CM | POA: Diagnosis not present

## 2018-01-02 DIAGNOSIS — F419 Anxiety disorder, unspecified: Secondary | ICD-10-CM | POA: Diagnosis not present

## 2018-01-02 DIAGNOSIS — K219 Gastro-esophageal reflux disease without esophagitis: Secondary | ICD-10-CM | POA: Diagnosis not present

## 2018-01-02 DIAGNOSIS — Z79899 Other long term (current) drug therapy: Secondary | ICD-10-CM | POA: Diagnosis not present

## 2018-01-02 DIAGNOSIS — Z91041 Radiographic dye allergy status: Secondary | ICD-10-CM | POA: Diagnosis not present
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
## 2018-01-14 ENCOUNTER — Ambulatory Visit (HOSPITAL_COMMUNITY): Payer: Self-pay | Admitting: Psychiatry

## 2018-01-16 NOTE — Congregational Nurse Program (Signed)
Client was in today to obtain food, but has question about a particular insurance plan; a provider that accepts her insurance.  Client has a seizure disorder and need a neuroligist.  Client has appointment with a PCP in January to get establish. Encouraged client to call insurance company to obtain providers that accept insurance. Also to check with current provider to be referred to a neuroligist.   Jennifer [name]  [phone].

## 2018-01-18 ENCOUNTER — Ambulatory Visit (INDEPENDENT_AMBULATORY_CARE_PROVIDER_SITE_OTHER): Payer: BLUE CROSS/BLUE SHIELD | Admitting: Gastroenterology

## 2018-01-18 ENCOUNTER — Encounter: Payer: Self-pay | Admitting: Gastroenterology

## 2018-01-18 VITALS — BP 114/82 | HR 86 | Temp 97.1°F | Ht 66.0 in | Wt 237.8 lb

## 2018-01-18 DIAGNOSIS — K641 Second degree hemorrhoids: Secondary | ICD-10-CM | POA: Insufficient documentation

## 2018-01-18 NOTE — Patient Instructions (Signed)
Continue to avoid straining.  Continue Benefiber  (or [employer] equivalent) 2 teaspoons twice daily  Use Miralax each evening for now. If you start having diarrhea, please let us know.   Call me with any issues!  This was your last banding session. I will see you again in 6-8 weeks for a normal outpatient visit.  Have a Merry Christmas!  I enjoyed seeing you again today! As you know, I value our relationship and want to provide genuine, compassionate, and quality care. I welcome your feedback. If you receive a survey regarding your visit,  I greatly appreciate you taking time to fill this out. See you next time!  Annitta Needs, PhD, ANP-BC [facility] : [facility]astroenter[facility]

## 2018-01-18 NOTE — Progress Notes (Signed)
   [place] BANDING NOTE:   Pleasant 46 year old female presenting with reports of prolapsing hemorrhoids, no rectal bleeding, itching noted in past. She has completed two prior banding procedures of the right anterior initially and most recently the right posterior. At last visit, she had some rectal discomfort with concern for occult anal fissure. Provided nitro, and she completed a 4 week course of this with complete resolution of discomfort. Continues to have no rectal bleeding, and she has had improvement in symptoms from bandings but still with minor issues. On antibiotics currently for brown recluse bite. Feels like antibiotics are making her more constipated.   The patient presents with symptomatic grade 1-2 hemorrhoids, unresponsive to maximal medical therapy, requesting rubber band ligation of his/her hemorrhoidal disease. All risks, benefits, and alternative forms of therapy were described and informed consent was obtained.   The decision was made to band the left lateral internal hemorrhoid, and the [place] was used to perform band ligation without complication. Digital anorectal examination was then performed to assure proper positioning of the band, and to adjust the banded tissue as required. The patient was discharged home without pain or other issues. Dietary and behavioral recommendations were given along with follow-up instructions to continue fiber intake and add Miralax once each evening. The patient will return in 6-8 weeks for routine office visit for other chronic GI issues.   No complications were encountered and the patient tolerated the procedure well.  Jennifer Needs, PhD, ANP-BC [facility]astroenter[facility]

## 2018-01-25 ENCOUNTER — Ambulatory Visit (HOSPITAL_COMMUNITY): Payer: BLUE CROSS/BLUE SHIELD | Admitting: Psychiatry

## 2018-01-25 DIAGNOSIS — F4310 Post-traumatic stress disorder, unspecified: Secondary | ICD-10-CM
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
## 2018-02-07 ENCOUNTER — Ambulatory Visit (INDEPENDENT_AMBULATORY_CARE_PROVIDER_SITE_OTHER): Payer: PRIVATE HEALTH INSURANCE | Admitting: Physician Assistant

## 2018-02-07 ENCOUNTER — Encounter: Payer: Self-pay | Admitting: Physician Assistant

## 2018-02-07 VITALS — BP 114/79 | HR 86 | Temp 97.3°F | Ht 66.0 in | Wt 236.8 lb

## 2018-02-07 DIAGNOSIS — G40909 Epilepsy, unspecified, not intractable, without status epilepticus: Secondary | ICD-10-CM

## 2018-02-07 DIAGNOSIS — R079 Chest pain, unspecified: Secondary | ICD-10-CM

## 2018-02-07 DIAGNOSIS — Z0000 Encounter for general adult medical examination without abnormal findings: Secondary | ICD-10-CM

## 2018-02-07 DIAGNOSIS — M2550 Pain in unspecified joint: Secondary | ICD-10-CM | POA: Insufficient documentation

## 2018-02-07 DIAGNOSIS — M25552 Pain in left hip: Secondary | ICD-10-CM | POA: Diagnosis not present

## 2018-02-07 DIAGNOSIS — R413 Other amnesia: Secondary | ICD-10-CM

## 2018-02-07 DIAGNOSIS — F0630 Mood disorder due to known physiological condition, unspecified: Secondary | ICD-10-CM | POA: Diagnosis not present

## 2018-02-07 DIAGNOSIS — R739 Hyperglycemia, unspecified: Secondary | ICD-10-CM

## 2018-02-07 LAB — BAYER DCA HB A1C WAIVED: HB A1C: 5.4 % (ref <7.0)

## 2018-02-07 MED ORDER — MELOXICAM 7.5 MG PO TABS: 7.5000 mg | ORAL_TABLET | Freq: Every day | ORAL | 2 refills | Status: DC

## 2018-02-08 LAB — CBC WITH DIFFERENTIAL/PLATELET
Basophils Absolute: 0.1 10*3/uL (ref 0.0–0.2)
Basos: 1 %
EOS (ABSOLUTE): 0.1 10*3/uL (ref 0.0–0.4)
EOS: 2 %
HEMATOCRIT: 38 % (ref 34.0–46.6)
HEMOGLOBIN: 11.7 g/dL (ref 11.1–15.9)
Immature Grans (Abs): 0 10*3/uL (ref 0.0–0.1)
Immature Granulocytes: 0 %
LYMPHS ABS: 2.3 10*3/uL (ref 0.7–3.1)
Lymphs: 42 %
MCH: 22.7 pg — AB (ref 26.6–33.0)
MCHC: 30.8 g/dL — AB (ref 31.5–35.7)
MCV: 74 fL — AB (ref 79–97)
MONOCYTES: 7 %
MONOS ABS: 0.4 10*3/uL (ref 0.1–0.9)
NEUTROS ABS: 2.6 10*3/uL (ref 1.4–7.0)
Neutrophils: 48 %
Platelets: 315 10*3/uL (ref 150–450)
RBC: 5.15 x10E6/uL (ref 3.77–5.28)
RDW: 15.2 % (ref 12.3–15.4)
WBC: 5.5 10*3/uL (ref 3.4–10.8)

## 2018-02-08 LAB — LIPID PANEL
CHOLESTEROL TOTAL: 172 mg/dL (ref 100–199)
Chol/HDL Ratio: 3.5 ratio (ref 0.0–4.4)
HDL: 49 mg/dL (ref >39)
LDL Calculated: 108 mg/dL — ABNORMAL HIGH (ref 0–99)
TRIGLYCERIDES: 74 mg/dL (ref 0–149)
VLDL Cholesterol Cal: 15 mg/dL (ref 5–40)

## 2018-02-08 LAB — CMP14+EGFR
A/G RATIO: 1.7 (ref 1.2–2.2)
ALBUMIN: 4.3 g/dL (ref 3.5–5.5)
ALK PHOS: 74 IU/L (ref 39–117)
ALT: 9 IU/L (ref 0–32)
AST: 13 IU/L (ref 0–40)
BILIRUBIN TOTAL: 0.3 mg/dL (ref 0.0–1.2)
BUN / CREAT RATIO: 10 (ref 9–23)
BUN: 13 mg/dL (ref 6–24)
CO2: 22 mmol/L (ref 20–29)
Calcium: 9.5 mg/dL (ref 8.7–10.2)
Chloride: 105 mmol/L (ref 96–106)
Creatinine, Ser: 1.29 mg/dL — ABNORMAL HIGH (ref 0.57–1.00)
GFR calc Af Amer: 57 mL/min/{1.73_m2} — ABNORMAL LOW (ref >59)
GFR calc non Af Amer: 49 mL/min/{1.73_m2} — ABNORMAL LOW (ref >59)
GLOBULIN, TOTAL: 2.6 g/dL (ref 1.5–4.5)
Glucose: 94 mg/dL (ref 65–99)
POTASSIUM: 4.6 mmol/L (ref 3.5–5.2)
SODIUM: 140 mmol/L (ref 134–144)
Total Protein: 6.9 g/dL (ref 6.0–8.5)

## 2018-02-08 LAB — TSH: TSH: 1.75 u[IU]/mL (ref 0.450–4.500)

## 2018-02-10 LAB — TOXASSURE SELECT 13 (MW), URINE

## 2018-02-11 NOTE — Progress Notes (Signed)
BP 114/79   Pulse 86   Temp (!) 97.3 F (36.3 C) (Oral)   Ht _0  (1.676 m)   Wt 236 lb 12.8 oz (107.4 kg)   BMI 38.22 kg/m    Subjective:    Patient ID: Jennifer [name], female    DOB: 06-19-1970, 48 y.o.   MRN: [id number]  HPI: Jennifer [name] is a 48 y.o. female presenting on 02/07/2018 for New Patient (Initial Visit) and Establish Care  This patient comes in as a new patient to be established with [place].  She was formally a patient at Dr. Scotty Court and Bluth's office.  She has an extensive medical history.  She does have seizure disorder.  She had been seeing Dr. Merlene Laughter in [place] for quite some time.  However with the change of her insurance she is not able to go to the office anymore.  She would like to see another neurologist.  We will schedule her with [facility].  She states that she is good on her current medications.  Advised her to continue these until she is seen by the specialist.  She states she has been developing more memory loss.  This is a new symptom over the past year.  I have asked her to discuss this with her neurologist also.  She is also followed by gastroenterology, gynecology.  She had a hysterectomy related to tumors on her ovaries.  She could not state that it was cancer but that she did have surgery.  So she is continuing with her gynecologist which I do recommend.  She is also followed by psychiatry Dr. Modesta Messing in [place].  Her past medical history is positive for allergies, anemia, anxiety, arthritis, asthma, cancer, diabetes, GERD, hypertension, epilepsy, history of substance abuse.   Past Medical History:  Diagnosis Date  . Allergy   . Anemia   . Anxiety   . Arthritis    neck  . Asthma   . Diabetes mellitus without complication ([place])   . GERD (gastroesophageal reflux disease)   . History of flexible sigmoidoscopy 1999   Normal to 60cm,  . Hypertension   . Neuromuscular disorder ([place])   . Ovarian cancer ([place])    skin cancer    . Panic attack   . PTSD (post-traumatic stress disorder)   . S/P endoscopy Feb 2012   Nl, s/p [facility] dilator, biopsy benign  . S/P endoscopy 2008   Mild gastritis  . Seizures ([place])   . Substance abuse ([place])    drug addiction   Relevant past medical, surgical, family and social history reviewed and updated as indicated. Interim medical history since our last visit reviewed. Allergies and medications reviewed and updated. DATA REVIEWED: CHART IN EPIC  Family History reviewed for pertinent findings.  Review of Systems  Constitutional: Negative.   HENT: Negative.   Eyes: Negative.   Respiratory: Negative.   Gastrointestinal: Negative.   Genitourinary: Negative.   Musculoskeletal: Positive for arthralgias, back pain and myalgias.  Neurological: Positive for seizures. Negative for weakness.  Psychiatric/Behavioral: Negative for sleep disturbance.    Allergies as of 02/07/2018      Reactions   Metrizamide Rash, Swelling   Hands swelling/rash   Shellfish Allergy Swelling   Swells tongue   Codeine Other (See Comments)   itching   Hydrocodone Itching   Rash and itching   Ivp Dye [iodinated Diagnostic Agents] Rash   Hands swelling/rash   Latex Rash      Medication List  Accurate as of February 07, 2018 11:59 PM. Always use your most recent med list.        busPIRone 10 MG tablet Commonly known as:  BUSPAR Take 2 tablets (20 mg total) by mouth 3 (three) times daily.   cetirizine 10 MG tablet Commonly known as:  ZYRTEC Take 10 mg by mouth every morning.   clobetasol cream 0.05 % Commonly known as:  TEMOVATE Apply 1 application topically as needed.   DULoxetine 60 MG capsule Commonly known as:  CYMBALTA Take 1 capsule (60 mg total) by mouth daily.   fluticasone 50 MCG/ACT nasal spray Commonly known as:  FLONASE Place 1 spray into both nostrils daily.   hydrocortisone 2.5 % rectal cream Commonly known as:  ANUSOL-HC Place 1 application rectally 2  (two) times daily.   lamoTRIgine 100 MG tablet Commonly known as:  LAMICTAL Take 1 tablet (100 mg total) by mouth daily.   Levetiracetam 750 MG Tb24 Take 750 mg by mouth at bedtime.   meloxicam 7.5 MG tablet Commonly known as:  MOBIC Take 1 tablet (7.5 mg total) by mouth daily.   metoCLOPramide 5 MG tablet Commonly known as:  REGLAN Take 5 mg by mouth 4 (four) times daily -  before meals and at bedtime.   mupirocin ointment 2 % Commonly known as:  BACTROBAN   Nitroglycerin 0.4 % Oint Place 1 inch rectally 2 (two) times daily. Alternate with anusol. Wear gloves, wash hands after.   oxybutynin 5 MG tablet Commonly known as:  DITROPAN Take 5 mg by mouth 2 (two) times daily.   pantoprazole 40 MG tablet Commonly known as:  PROTONIX Take 1 tablet (40 mg total) by mouth 2 (two) times daily before a meal.   polyethylene glycol packet Commonly known as:  MIRALAX / GLYCOLAX Take 17 g by mouth daily as needed.   traZODone 50 MG tablet Commonly known as:  DESYREL 25-50 mg at night as needed for sleep   Vitamin D (Ergocalciferol) 1.25 MG (50000 UT) Caps capsule Commonly known as:  DRISDOL Take 50,000 Units by mouth every 7 (seven) days.          Objective:    BP 114/79   Pulse 86   Temp (!) 97.3 F (36.3 C) (Oral)   Ht '5\' 6"'$  (1.676 m)   Wt 236 lb 12.8 oz (107.4 kg)   BMI 38.22 kg/m   Allergies  Allergen Reactions  . Metrizamide Rash and Swelling    Hands swelling/rash  . Shellfish Allergy Swelling    Swells tongue  . Codeine Other (See Comments)    itching  . Hydrocodone Itching    Rash and itching  . Ivp Dye [Iodinated Diagnostic Agents] Rash    Hands swelling/rash  . Latex Rash    Wt Readings from Last 3 Encounters:  02/07/18 236 lb 12.8 oz (107.4 kg)  01/18/18 237 lb 12.8 oz (107.9 kg)  12/26/17 237 lb 12.8 oz (107.9 kg)    Physical Exam Constitutional:      Appearance: She is well-developed.  HENT:     Head: Normocephalic and atraumatic.  Eyes:      Conjunctiva/sclera: Conjunctivae normal.     Pupils: Pupils are equal, round, and reactive to light.  Cardiovascular:     Rate and Rhythm: Normal rate and regular rhythm.     Heart sounds: Normal heart sounds.  Pulmonary:     Effort: Pulmonary effort is normal.     Breath sounds: Normal breath sounds.  Abdominal:  General: Bowel sounds are normal.     Palpations: Abdomen is soft.  Skin:    General: Skin is warm and dry.     Findings: No rash.  Neurological:     Mental Status: She is alert and oriented to person, place, and time.     Deep Tendon Reflexes: Reflexes are normal and symmetric.  Psychiatric:        Behavior: Behavior normal.        Thought Content: Thought content normal.        Judgment: Judgment normal.     Results for orders placed or performed in visit on 02/07/18  CBC with Differential/Platelet  Result Value Ref Range   WBC 5.5 3.4 - 10.8 x10E3/uL   RBC 5.15 3.77 - 5.28 x10E6/uL   Hemoglobin 11.7 11.1 - 15.9 g/dL   Hematocrit 38.0 34.0 - 46.6 %   MCV 74 (L) 79 - 97 fL   MCH 22.7 (L) 26.6 - 33.0 pg   MCHC 30.8 (L) 31.5 - 35.7 g/dL   RDW 15.2 12.3 - 15.4 %   Platelets 315 150 - 450 x10E3/uL   Neutrophils 48 Not Estab. %   Lymphs 42 Not Estab. %   Monocytes 7 Not Estab. %   Eos 2 Not Estab. %   Basos 1 Not Estab. %   Neutrophils Absolute 2.6 1.4 - 7.0 x10E3/uL   Lymphocytes Absolute 2.3 0.7 - 3.1 x10E3/uL   Monocytes Absolute 0.4 0.1 - 0.9 x10E3/uL   EOS (ABSOLUTE) 0.1 0.0 - 0.4 x10E3/uL   Basophils Absolute 0.1 0.0 - 0.2 x10E3/uL   Immature Granulocytes 0 Not Estab. %   Immature Grans (Abs) 0.0 0.0 - 0.1 x10E3/uL  CMP14+EGFR  Result Value Ref Range   Glucose 94 65 - 99 mg/dL   BUN 13 6 - 24 mg/dL   Creatinine, Ser 1.29 (H) 0.57 - 1.00 mg/dL   GFR calc non Af Amer 49 (L) >59 mL/min/1.73   GFR calc Af Amer 57 (L) >59 mL/min/1.73   BUN/Creatinine Ratio 10 9 - 23   Sodium 140 134 - 144 mmol/L   Potassium 4.6 3.5 - 5.2 mmol/L   Chloride 105  96 - 106 mmol/L   CO2 22 20 - 29 mmol/L   Calcium 9.5 8.7 - 10.2 mg/dL   Total Protein 6.9 6.0 - 8.5 g/dL   Albumin 4.3 3.5 - 5.5 g/dL   Globulin, Total 2.6 1.5 - 4.5 g/dL   Albumin/Globulin Ratio 1.7 1.2 - 2.2   Bilirubin Total 0.3 0.0 - 1.2 mg/dL   Alkaline Phosphatase 74 39 - 117 IU/L   AST 13 0 - 40 IU/L   ALT 9 0 - 32 IU/L  Lipid panel  Result Value Ref Range   Cholesterol, Total 172 100 - 199 mg/dL   Triglycerides 74 0 - 149 mg/dL   HDL 49 >39 mg/dL   VLDL Cholesterol Cal 15 5 - 40 mg/dL   LDL Calculated 108 (H) 0 - 99 mg/dL   Chol/HDL Ratio 3.5 0.0 - 4.4 ratio  TSH  Result Value Ref Range   TSH 1.750 0.450 - 4.500 uIU/mL  Bayer DCA Hb A1c Waived  Result Value Ref Range   HB A1C (BAYER DCA - WAIVED) 5.4 <7.0 %  ToxASSURE Select 13 (MW), Urine  Result Value Ref Range   Summary FINAL       Assessment & Plan:   1. Memory loss - ToxASSURE Select 13 (MW), Urine  2. Seizure disorder ([place]) Continue  medications Referral to neurology  3. Mood disorder in conditions classified elsewhere Continue with psychiatry  4. Pain of left hip joint - meloxicam (MOBIC) 7.5 MG tablet; Take 1 tablet (7.5 mg total) by mouth daily.  Dispense: 30 tablet; Refill: 2  5. Well adult exam - CBC with Differential/Platelet - CMP14+EGFR - Lipid panel - TSH - Bayer DCA Hb A1c Waived  6. Hyperglycemia - Bayer DCA Hb A1c Waived  7. Chest pain, unspecified type - ToxASSURE Select 13 (MW), Urine   Continue all other maintenance medications as listed above.  Follow up plan: Return in about 6 weeks (around 03/21/2018).  Educational handout given for [place] PA-C [place] [address]  [place], [place] [address] [phone]   02/11/2018, 7:53 AM

## 2018-02-14 ENCOUNTER — Telehealth (HOSPITAL_COMMUNITY): Payer: Self-pay | Admitting: *Deleted

## 2018-02-15 NOTE — Telephone Encounter (Signed)
Patient called concerning letter for jury duty will discuss in appointment on 03/07/2018

## 2018-02-26 ENCOUNTER — Other Ambulatory Visit: Payer: Self-pay | Admitting: Physician Assistant

## 2018-02-26 ENCOUNTER — Telehealth: Payer: Self-pay

## 2018-02-26 DIAGNOSIS — R413 Other amnesia: Secondary | ICD-10-CM

## 2018-02-26 DIAGNOSIS — G40909 Epilepsy, unspecified, not intractable, without status epilepticus: Secondary | ICD-10-CM

## 2018-02-26 NOTE — Telephone Encounter (Signed)
Order placed

## 2018-02-26 NOTE — Telephone Encounter (Signed)
Patient said when she was seen she asked for a referral to [facility]
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
## 2018-02-28 ENCOUNTER — Encounter (HOSPITAL_COMMUNITY): Payer: Self-pay | Admitting: Psychiatry

## 2018-02-28 ENCOUNTER — Ambulatory Visit (HOSPITAL_COMMUNITY): Payer: Medicaid Other | Admitting: Psychiatry

## 2018-02-28 VITALS — BP 121/84 | HR 71 | Ht 60.0 in | Wt 234.0 lb

## 2018-02-28 DIAGNOSIS — F4310 Post-traumatic stress disorder, unspecified: Secondary | ICD-10-CM | POA: Diagnosis not present

## 2018-02-28 MED ORDER — BUSPIRONE HCL 10 MG PO TABS: 20.0000 mg | ORAL_TABLET | Freq: Three times a day (TID) | ORAL | 2 refills | Status: DC

## 2018-02-28 MED ORDER — LAMOTRIGINE 150 MG PO TABS: 150.0000 mg | ORAL_TABLET | Freq: Every day | ORAL | 2 refills | Status: DC

## 2018-02-28 MED ORDER — DULOXETINE HCL 60 MG PO CPEP: 60.0000 mg | ORAL_CAPSULE | Freq: Every day | ORAL | 2 refills | Status: DC

## 2018-02-28 MED ORDER — TRAZODONE HCL 100 MG PO TABS: ORAL_TABLET | ORAL | 2 refills | Status: DC

## 2018-02-28 NOTE — Patient Instructions (Signed)
1. Continue duloxetine 60 mg daily  2. ContinueBuspar20 mg three times a day 3. Increase lamotrigine 150 mg daily  4. Increasetrazodone 100 mg at night as needed for sleep 5.Return to clinic inthree months for 15 mins

## 2018-03-07 ENCOUNTER — Ambulatory Visit (HOSPITAL_COMMUNITY): Payer: BLUE CROSS/BLUE SHIELD | Admitting: Psychiatry

## 2018-03-15 ENCOUNTER — Ambulatory Visit (HOSPITAL_COMMUNITY): Payer: BLUE CROSS/BLUE SHIELD | Admitting: Psychiatry

## 2018-03-22 ENCOUNTER — Encounter: Payer: Self-pay | Admitting: Physician Assistant

## 2018-03-22 ENCOUNTER — Ambulatory Visit (INDEPENDENT_AMBULATORY_CARE_PROVIDER_SITE_OTHER): Payer: PRIVATE HEALTH INSURANCE | Admitting: Physician Assistant

## 2018-03-22 VITALS — BP 106/78 | HR 78 | Temp 97.1°F | Ht 60.0 in | Wt 233.0 lb

## 2018-03-22 DIAGNOSIS — M25552 Pain in left hip: Secondary | ICD-10-CM | POA: Diagnosis not present

## 2018-03-22 DIAGNOSIS — F0630 Mood disorder due to known physiological condition, unspecified: Secondary | ICD-10-CM

## 2018-03-22 DIAGNOSIS — G8929 Other chronic pain: Secondary | ICD-10-CM | POA: Insufficient documentation

## 2018-03-22 DIAGNOSIS — M25562 Pain in left knee: Secondary | ICD-10-CM

## 2018-03-22 DIAGNOSIS — M2242 Chondromalacia patellae, left knee: Secondary | ICD-10-CM

## 2018-03-22 DIAGNOSIS — R413 Other amnesia: Secondary | ICD-10-CM

## 2018-03-22 DIAGNOSIS — G40909 Epilepsy, unspecified, not intractable, without status epilepticus: Secondary | ICD-10-CM | POA: Diagnosis not present

## 2018-03-22 NOTE — Patient Instructions (Signed)
Please call neurology office and get on the cancellation list and be seen at an earlier time.

## 2018-03-22 NOTE — Progress Notes (Signed)
  Temp (!) 97.1 F (36.2 C) (Oral)   Ht 5' (1.524 m)   Wt 233 lb (105.7 kg)   BMI 45.50 kg/m    Subjective:    Patient ID: Jennifer [name], female    DOB: 04/26/1970, 47 y.o.   MRN: [id number]  HPI: Jennifer [name] is a 47 y.o. female presenting on 03/22/2018 for Memory Loss (6 week follow up )  This patient comes in for periodic recheck on medications and conditions including memory loss, seizure disorder, mood disorder, hip pain, chronic knee pain. She has been to [facility] a few years ago for the knee. She has a lot more pain in it, especially after being up.    She continues with seizure disorder and memory loss, has upcoming neurology appointment.   All medications are reviewed today. There are no reports of any problems with the medications. All of the medical conditions are reviewed and updated.  Lab work is reviewed and will be ordered as medically necessary. There are no new problems reported with today's visit.  Depression screen PHQ 2/9 03/22/2018 02/07/2018 02/15/2017 10/12/2016  Decreased Interest 1 0 0 0  Down, Depressed, Hopeless 1 0 0 0  PHQ - 2 Score 2 0 0 0  Altered sleeping 0 - - -  Tired, decreased energy 3 - - -  Change in appetite 1 - - -  Feeling bad or failure about yourself  0 - - -  Trouble concentrating 1 - - -  Moving slowly or fidgety/restless 0 - - -  Suicidal thoughts 0 - - -  PHQ-9 Score 7 - - -    Past Medical History:  Diagnosis Date  . Allergy   . Anemia   . Anxiety   . Arthritis    neck  . Asthma   . Diabetes mellitus without complication (HCC)   . GERD (gastroesophageal reflux disease)   . History of flexible sigmoidoscopy 1999   Normal to 60cm,  . Hypertension   . Neuromuscular disorder (HCC)   . Ovarian cancer (HCC)    skin cancer  . Panic attack   . PTSD (post-traumatic stress disorder)   . S/P endoscopy Feb 2012   Nl, s/p 56-French [place] dilator, biopsy benign  . S/P endoscopy 2008   Mild gastritis  . Seizures  (HCC)   . Substance abuse (HCC)    drug addiction   Relevant past medical, surgical, family and social history reviewed and updated as indicated. Interim medical history since our last visit reviewed. Allergies and medications reviewed and updated. DATA REVIEWED: CHART IN EPIC  Family History reviewed for pertinent findings.  Review of Systems  Constitutional: Negative.   HENT: Negative.   Eyes: Negative.   Respiratory: Negative.   Gastrointestinal: Negative.   Genitourinary: Negative.   Musculoskeletal: Positive for arthralgias.  Neurological: Positive for seizures and weakness.  Psychiatric/Behavioral: Positive for confusion and decreased concentration.    Allergies as of 03/22/2018      Reactions   Metrizamide Rash, Swelling   Hands swelling/rash   Shellfish Allergy Swelling   Swells tongue   Codeine Other (See Comments)   itching   Hydrocodone Itching   Rash and itching   Ivp Dye [iodinated Diagnostic Agents] Rash   Hands swelling/rash   Latex Rash      Medication List       Accurate as of March 22, 2018 11:59 PM. Always use your most recent med list.          busPIRone 10 MG tablet Commonly known as:  BUSPAR Take 2 tablets (20 mg total) by mouth 3 (three) times daily.   cetirizine 10 MG tablet Commonly known as:  ZYRTEC Take 10 mg by mouth every morning.   clobetasol cream 0.05 % Commonly known as:  TEMOVATE Apply 1 application topically as needed.   DULoxetine 60 MG capsule Commonly known as:  CYMBALTA Take 1 capsule (60 mg total) by mouth daily.   fluticasone 50 MCG/ACT nasal spray Commonly known as:  FLONASE Place 1 spray into both nostrils daily.   hydrocortisone 2.5 % rectal cream Commonly known as:  ANUSOL-HC Place 1 application rectally 2 (two) times daily.   lamoTRIgine 150 MG tablet Commonly known as:  LAMICTAL Take 1 tablet (150 mg total) by mouth daily.   Levetiracetam 750 MG Tb24 Take 750 mg by mouth at bedtime.   meloxicam  7.5 MG tablet Commonly known as:  MOBIC Take 1 tablet (7.5 mg total) by mouth daily.   methocarbamol 500 MG tablet Commonly known as:  ROBAXIN Take by mouth.   metoCLOPramide 5 MG tablet Commonly known as:  REGLAN Take 5 mg by mouth 4 (four) times daily -  before meals and at bedtime.   mupirocin ointment 2 % Commonly known as:  BACTROBAN   Nitroglycerin 0.4 % Oint Place 1 inch rectally 2 (two) times daily. Alternate with anusol. Wear gloves, wash hands after.   oxybutynin 5 MG tablet Commonly known as:  DITROPAN Take 5 mg by mouth 2 (two) times daily.   pantoprazole 40 MG tablet Commonly known as:  PROTONIX Take 1 tablet (40 mg total) by mouth 2 (two) times daily before a meal.   polyethylene glycol packet Commonly known as:  MIRALAX / GLYCOLAX Take 17 g by mouth daily as needed.   traZODone 100 MG tablet Commonly known as:  DESYREL 50-100 mg at night as needed for sleep   Vitamin D (Ergocalciferol) 1.25 MG (50000 UT) Caps capsule Commonly known as:  DRISDOL Take 50,000 Units by mouth every 7 (seven) days.          Objective:    Temp (!) 97.1 F (36.2 C) (Oral)   Ht 5' (1.524 m)   Wt 233 lb (105.7 kg)   BMI 45.50 kg/m   Allergies  Allergen Reactions  . Metrizamide Rash and Swelling    Hands swelling/rash  . Shellfish Allergy Swelling    Swells tongue  . Codeine Other (See Comments)    itching  . Hydrocodone Itching    Rash and itching  . Ivp Dye [Iodinated Diagnostic Agents] Rash    Hands swelling/rash  . Latex Rash    Wt Readings from Last 3 Encounters:  03/22/18 233 lb (105.7 kg)  02/07/18 236 lb 12.8 oz (107.4 kg)  01/18/18 237 lb 12.8 oz (107.9 kg)    Physical Exam Constitutional:      Appearance: She is well-developed.  HENT:     Head: Normocephalic and atraumatic.  Eyes:     Conjunctiva/sclera: Conjunctivae normal.     Pupils: Pupils are equal, round, and reactive to light.  Cardiovascular:     Rate and Rhythm: Normal rate and  regular rhythm.     Heart sounds: Normal heart sounds.  Pulmonary:     Effort: Pulmonary effort is normal.     Breath sounds: Normal breath sounds.  Abdominal:     General: Bowel sounds are normal.     Palpations: Abdomen is soft.  Musculoskeletal:  Left knee: She exhibits decreased range of motion. Tenderness found.       Legs:  Skin:    General: Skin is warm and dry.     Findings: No rash.  Neurological:     Mental Status: She is alert and oriented to person, place, and time.     Deep Tendon Reflexes: Reflexes are normal and symmetric.  Psychiatric:        Behavior: Behavior normal.        Thought Content: Thought content normal.        Judgment: Judgment normal.     Results for orders placed or performed in visit on 02/07/18  CBC with Differential/Platelet  Result Value Ref Range   WBC 5.5 3.4 - 10.8 x10E3/uL   RBC 5.15 3.77 - 5.28 x10E6/uL   Hemoglobin 11.7 11.1 - 15.9 g/dL   Hematocrit 38.0 34.0 - 46.6 %   MCV 74 (L) 79 - 97 fL   MCH 22.7 (L) 26.6 - 33.0 pg   MCHC 30.8 (L) 31.5 - 35.7 g/dL   RDW 15.2 12.3 - 15.4 %   Platelets 315 150 - 450 x10E3/uL   Neutrophils 48 Not Estab. %   Lymphs 42 Not Estab. %   Monocytes 7 Not Estab. %   Eos 2 Not Estab. %   Basos 1 Not Estab. %   Neutrophils Absolute 2.6 1.4 - 7.0 x10E3/uL   Lymphocytes Absolute 2.3 0.7 - 3.1 x10E3/uL   Monocytes Absolute 0.4 0.1 - 0.9 x10E3/uL   EOS (ABSOLUTE) 0.1 0.0 - 0.4 x10E3/uL   Basophils Absolute 0.1 0.0 - 0.2 x10E3/uL   Immature Granulocytes 0 Not Estab. %   Immature Grans (Abs) 0.0 0.0 - 0.1 x10E3/uL  CMP14+EGFR  Result Value Ref Range   Glucose 94 65 - 99 mg/dL   BUN 13 6 - 24 mg/dL   Creatinine, Ser 1.29 (H) 0.57 - 1.00 mg/dL   GFR calc non Af Amer 49 (L) >59 mL/min/1.73   GFR calc Af Amer 57 (L) >59 mL/min/1.73   BUN/Creatinine Ratio 10 9 - 23   Sodium 140 134 - 144 mmol/L   Potassium 4.6 3.5 - 5.2 mmol/L   Chloride 105 96 - 106 mmol/L   CO2 22 20 - 29 mmol/L   Calcium 9.5  8.7 - 10.2 mg/dL   Total Protein 6.9 6.0 - 8.5 g/dL   Albumin 4.3 3.5 - 5.5 g/dL   Globulin, Total 2.6 1.5 - 4.5 g/dL   Albumin/Globulin Ratio 1.7 1.2 - 2.2   Bilirubin Total 0.3 0.0 - 1.2 mg/dL   Alkaline Phosphatase 74 39 - 117 IU/L   AST 13 0 - 40 IU/L   ALT 9 0 - 32 IU/L  Lipid panel  Result Value Ref Range   Cholesterol, Total 172 100 - 199 mg/dL   Triglycerides 74 0 - 149 mg/dL   HDL 49 >39 mg/dL   VLDL Cholesterol Cal 15 5 - 40 mg/dL   LDL Calculated 108 (H) 0 - 99 mg/dL   Chol/HDL Ratio 3.5 0.0 - 4.4 ratio  TSH  Result Value Ref Range   TSH 1.750 0.450 - 4.500 uIU/mL  Bayer DCA Hb A1c Waived  Result Value Ref Range   HB A1C (BAYER DCA - WAIVED) 5.4 <7.0 %  ToxASSURE Select 13 (MW), Urine  Result Value Ref Range   Summary FINAL       Assessment & Plan:   1. Memory loss Go to neurology appointment  2. Seizure disorder (  HCC) Go to neurology appointment  3. Mood disorder in conditions classified elsewhere Continue psychiatry care  4. Pain of left hip joint - Ambulatory referral to Orthopedic Surgery  5. Chondromalacia of left patella - Ambulatory referral to Orthopedic Surgery  6. Chronic pain of left knee - Ambulatory referral to Orthopedic Surgery   Continue all other maintenance medications as listed above.  Follow up plan: Return in about 2 months (around 05/21/2018).  Educational handout given for survey   S.  PA-C [facility] [address]  [place], [place] [address] [phone]   03/24/2018, 10:48 PM  

## 2018-04-02 LAB — HM MAMMOGRAPHY

## 2018-04-03 ENCOUNTER — Ambulatory Visit (INDEPENDENT_AMBULATORY_CARE_PROVIDER_SITE_OTHER): Payer: PRIVATE HEALTH INSURANCE | Admitting: Orthopaedic Surgery

## 2018-04-03 ENCOUNTER — Encounter (INDEPENDENT_AMBULATORY_CARE_PROVIDER_SITE_OTHER): Payer: Self-pay | Admitting: Orthopaedic Surgery

## 2018-04-03 VITALS — BP 106/70 | HR 91 | Ht 66.0 in | Wt 231.0 lb

## 2018-04-03 DIAGNOSIS — M25552 Pain in left hip: Secondary | ICD-10-CM | POA: Diagnosis not present

## 2018-04-03 DIAGNOSIS — M25562 Pain in left knee: Secondary | ICD-10-CM | POA: Diagnosis not present

## 2018-04-03 DIAGNOSIS — G8929 Other chronic pain: Secondary | ICD-10-CM

## 2018-04-03 MED ORDER — BUPIVACAINE HCL 0.5 % IJ SOLN
2.0000 mL | INTRAMUSCULAR | Status: AC | PRN
  Administered 2018-04-03: 2 mL via INTRA_ARTICULAR

## 2018-04-03 MED ORDER — LIDOCAINE HCL 1 % IJ SOLN
2.0000 mL | INTRAMUSCULAR | Status: AC | PRN
  Administered 2018-04-03: 2 mL

## 2018-04-03 MED ORDER — METHYLPREDNISOLONE ACETATE 40 MG/ML IJ SUSP
80.0000 mg | INTRAMUSCULAR | Status: AC | PRN
  Administered 2018-04-03: 80 mg via INTRA_ARTICULAR

## 2018-04-03 NOTE — Progress Notes (Signed)
Office Visit Note   Patient: Jennifer [name]           Date of Birth: 1970-11-25           MRN: [id number] Visit Date: 04/03/2018              Requested by: Terald Sleeper, PA-C [address] [place], [place] [address] PCP: Terald Sleeper, PA-C   Assessment & Plan: Visit Diagnoses:  1. Chronic pain of left knee   2. Pain of left hip joint     Plan: Left knee pain is probably related to the isolated medial compartment arthritis.  From a diagnostic and therapeutic standpoint will inject with cortisone and monitor response over the next several weeks.  Also experiencing some lateral left hip pain which could be bursitis..  Could also be related to referred pain from her lumbar spine which is being evaluated by a neurologist  Follow-Up Instructions: Return in about 2 weeks (around 04/17/2018).   Orders:  Orders Placed This Encounter  Procedures  . Large Joint Inj: L knee   No orders of the defined types were placed in this encounter.     Procedures: Large Joint Inj: L knee on 04/03/2018 10:39 AM Indications: pain and diagnostic evaluation Details: 25 G 1.5 in needle, anteromedial approach  Arthrogram: No  Medications: 2 mL lidocaine 1 %; 2 mL bupivacaine 0.5 %; 80 mg methylPREDNISolone acetate 40 MG/ML Procedure, treatment alternatives, risks and benefits explained, specific risks discussed. Consent was given by the patient. Patient was prepped and draped in the usual sterile fashion.       Clinical Data: No additional findings.   Subjective: Chief Complaint  Patient presents with  . Left Hip - Pain  . Left Knee - Pain  Patient presents today for left knee and left hip pain. She said both areas have been bothering her for a long time, but has worsened in the last 6 months. She said that it feels like her left hip "catches". Her left knee locks up, grinds, and has sharp pains. She also states that she has been experiencing a warm sensation in her left thigh. She has a  history of left knee arthroscope in 2012 and a history of low back problems. Back problems being evaluated and followed by a neurologist. Presently not working. Films of her pelvis and left hip reviewed on PACS from June 2019.  I did not see any abnormalities about the left hip.  Joint space was well-maintained.  No peripheral osteophytes.  Some mild prominence of the lateral aspect of the acetabulum with a little bit of ectopic ossification.  Films of her knee were also reviewed from 2018 in the left knee there was very mild decrease in the medial joint space with very small peripheral osteophytes HPI  Review of Systems   Objective: Vital Signs: BP 106/70   Pulse 91   Ht 5\' 6"  (1.676 m)   Wt 231 lb (104.8 kg)   BMI 37.28 kg/m   Physical Exam Constitutional:      Appearance: She is well-developed.  Eyes:     Pupils: Pupils are equal, round, and reactive to light.  Pulmonary:     Effort: Pulmonary effort is normal.  Skin:    General: Skin is warm and dry.  Neurological:     Mental Status: She is alert and oriented to person, place, and time.  Psychiatric:        Behavior: Behavior normal.  Ortho Exam wake alert and oriented x3.  Comfortable sitting.  Large thighs.  Some areas of tenderness over the lateral aspect of the left hip.  No groin pain with internal and external rotation.  No thigh pain to palpation.  No knee effusion but does have some diffuse medial joint tenderness.  No popping or clicking with motion.  Full extension and flexion and no evidence of instability.  No calf pain.  No distal edema.  Full extension and flexion but 102 or 3 degrees.  Straight leg raise negative.  Specialty Comments:  No specialty comments available.  Imaging: No results found.   PMFS History: Patient Active Problem List   Diagnosis Date Noted  . Chronic pain of left knee 03/22/2018  . Memory loss 03/22/2018  . Joint pain 02/07/2018  . Grade II hemorrhoids 01/18/2018  . Grade  III hemorrhoids 12/06/2017  . Rib pain on left side 08/23/2017  . Abdominal pain, epigastric 06/06/2017  . Rectal bleeding 06/06/2017  . Rectal pain 06/06/2017  . PTSD (post-traumatic stress disorder) 05/31/2017  . Mood disorder in conditions classified elsewhere 05/31/2017  . History of deep vein thrombosis (DVT) of lower extremity 12/05/2016  . Tobacco abuse 10/12/2016  . Asthma in adult, mild intermittent, uncomplicated 12/45/8099  . SUI (stress urinary incontinence, female) 10/12/2016  . Chronic mental illness 10/12/2016  . Hiatal hernia 08/20/2012  . Obesity 11/22/2011  . HTN (hypertension) 09/21/2011  . Chondromalacia of left patella 05/23/2011  . ANEMIA 03/09/2009  . GASTROESOPHAGEAL REFLUX DISEASE, CHRONIC 03/09/2009  . Constipation 03/09/2009  . Seizure disorder ([place]) 03/09/2009   Past Medical History:  Diagnosis Date  . Allergy   . Anemia   . Anxiety   . Arthritis    neck  . Asthma   . Diabetes mellitus without complication ([place])   . GERD (gastroesophageal reflux disease)   . History of flexible sigmoidoscopy 1999   Normal to 60cm,  . Hypertension   . Neuromuscular disorder ([place])   . Ovarian cancer ([place])    skin cancer  . Panic attack   . PTSD (post-traumatic stress disorder)   . S/P endoscopy Feb 2012   Nl, s/p 56-[facility] dilator, biopsy benign  . S/P endoscopy 2008   Mild gastritis  . Seizures ([place])   . Substance abuse ([place])    drug addiction    Family History  Problem Relation Age of Onset  . Crohn's disease Maternal Aunt   . Breast cancer Maternal Grandmother   . Cancer Maternal Grandmother   . Colon cancer Maternal Grandfather        23  . Pancreatic cancer Maternal Grandfather   . Cancer Maternal Grandfather   . Crohn's disease Cousin        x 2 cousins  . Breast cancer Daughter 44  . Arthritis Mother   . COPD Mother   . Depression Mother   . Drug abuse Mother   . Heart disease Mother   . Hypertension Mother   . Hyperlipidemia  Mother   . Learning disabilities Mother   . Mental illness Mother   . Alcohol abuse Mother   . Early death Father 79       murder  . Alcohol abuse Father   . Drug abuse Father   . Learning disabilities Father   . Cancer Paternal Grandmother   . Alcohol abuse Paternal Grandmother   . Cancer Paternal Grandfather   . Alcohol abuse Maternal Uncle   . Alcohol abuse Paternal Uncle   .  Drug abuse Paternal Uncle     Past Surgical History:  Procedure Laterality Date  . ABDOMINAL HYSTERECTOMY     ovarian tumors  . CHOLECYSTECTOMY    . COLONOSCOPY  03/01/2011   [place]-External hemorrhoids/otherwise normal rectum and colon.  . ESOPHAGOGASTRODUODENOSCOPY  2008   gastritis  . ESOPHAGOGASTRODUODENOSCOPY  2012   Moderate sized hiatal hernia, non-H. pylori gastritis  . ESOPHAGOGASTRODUODENOSCOPY N/A 11/17/2013   RMR: Mild erosive reflux esophagitis. Patulous EG junction  . HERNIA REPAIR     Incisional with mesh  . PARTIAL HYSTERECTOMY    . SKIN CANCER EXCISION     left bicep  . TONSILLECTOMY     Social History   Occupational History  . Occupation: disabled    Fish farm manager: NOT EMPLOYED  Tobacco Use  . Smoking status: Current Some Day Smoker    Packs/day: 1.50    Years: 26.00    Pack years: 39.00    Types: Cigarettes    Start date: 05/03/1984  . Smokeless tobacco: Never Used  Substance and Sexual Activity  . Alcohol use: Not Currently    Alcohol/week: 1.0 standard drinks    Types: 1 Cans of beer per week    Comment: occasionally  . Drug use: No  . Sexual activity: Not Currently    Birth control/protection: Surgical

## 2018-04-09 NOTE — Progress Notes (Signed)
Referring Provider: Sandi Mealy, MD Primary Care Physician:  Terald Sleeper, PA-C Primary GI: Dr. Gala Romney   Chief Complaint  Patient presents with  . Abdominal Pain    LUQ x 2 weeks  . Nausea    no vomiting    HPI:   Jennifer [name] is a 48 y.o. female presenting today with a history of constipation and GERD, s/p hemorrhoid banding X 3 recently. EGD October 2015, patulous EG junction, mild erosive reflux esophagitis.Colonoscopy January 2013, external hemorrhoids but otherwise normal. Historically on Protonix BID.   Since hemorrhoid banding doing well. Had a family member recently staying with her and believes she had colitis. Family member was having diarrhea, rectal bleeding. If drinks a cup of coffee, has more frequent stool. Had tenesmus. LUQ sharp pain and when pushing in LUQ, it causes LLQ pain. Has lots of flatus. LUQ pain occurs without aggravating factors. Present for about 2 weeks. Intermittently. Not worse with eating. Nausea present for "a good while". Occasional vomiting. Takes Miralax as needed for constipation.    Past Medical History:  Diagnosis Date  . Allergy   . Anemia   . Anxiety   . Arthritis    neck  . Asthma   . Diabetes mellitus without complication ([place])   . GERD (gastroesophageal reflux disease)   . History of flexible sigmoidoscopy 1999   Normal to 60cm,  . Hypertension   . Neuromuscular disorder ([place])   . Ovarian cancer ([place])    skin cancer  . Panic attack   . PTSD (post-traumatic stress disorder)   . S/P endoscopy Feb 2012   Nl, s/p 56-[facility] dilator, biopsy benign  . S/P endoscopy 2008   Mild gastritis  . Seizures ([place])   . Substance abuse ([place])    drug addiction    Past Surgical History:  Procedure Laterality Date  . ABDOMINAL HYSTERECTOMY     ovarian tumors  . CHOLECYSTECTOMY    . COLONOSCOPY  03/01/2011   Rourk-External hemorrhoids/otherwise normal rectum and colon.  . ESOPHAGOGASTRODUODENOSCOPY  2008   gastritis  . ESOPHAGOGASTRODUODENOSCOPY  2012   Moderate sized hiatal hernia, non-H. pylori gastritis  . ESOPHAGOGASTRODUODENOSCOPY N/A 11/17/2013   RMR: Mild erosive reflux esophagitis. Patulous EG junction  . HERNIA REPAIR     Incisional with mesh  . PARTIAL HYSTERECTOMY    . SKIN CANCER EXCISION     left bicep  . TONSILLECTOMY      Current Outpatient Medications  Medication Sig Dispense Refill  . busPIRone (BUSPAR) 10 MG tablet Take 2 tablets (20 mg total) by mouth 3 (three) times daily. 180 tablet 2  . cetirizine (ZYRTEC) 10 MG tablet Take 10 mg by mouth every morning.    . clobetasol cream (TEMOVATE) 1.06 % Apply 1 application topically as needed.    . DULoxetine (CYMBALTA) 60 MG capsule Take 1 capsule (60 mg total) by mouth daily. 30 capsule 2  . fluticasone (FLONASE) 50 MCG/ACT nasal spray Place 1 spray into both nostrils daily.    . hydrocortisone (ANUSOL-HC) 2.5 % rectal cream Place 1 application rectally 2 (two) times daily. (Patient taking differently: Place 1 application rectally as needed. ) 30 g 1  . lamoTRIgine (LAMICTAL) 150 MG tablet Take 1 tablet (150 mg total) by mouth daily. 30 tablet 2  . Levetiracetam 750 MG TB24 Take 750 mg by mouth at bedtime.  4  . meloxicam (MOBIC) 7.5 MG tablet Take 1 tablet (7.5 mg total) by mouth  daily. 30 tablet 2  . methocarbamol (ROBAXIN) 500 MG tablet Take 500-1,000 mg by mouth daily as needed.     . metoCLOPramide (REGLAN) 5 MG tablet Take 5 mg by mouth 4 (four) times daily -  before meals and at bedtime.    . mupirocin ointment (BACTROBAN) 2 %   0  . Nitroglycerin 0.4 % OINT Place 1 inch rectally 2 (two) times daily. Alternate with anusol. Wear gloves, wash hands after. (Patient taking differently: Place 1 inch rectally as needed. Alternate with anusol. Wear gloves, wash hands after.) 1 Tube 1  . oxybutynin (DITROPAN) 5 MG tablet Take 5 mg by mouth 2 (two) times daily.    . pantoprazole (PROTONIX) 40 MG tablet Take 1 tablet (40 mg  total) by mouth 2 (two) times daily before a meal. 60 tablet 5  . polyethylene glycol (MIRALAX / GLYCOLAX) packet Take 17 g by mouth daily as needed.    . traZODone (DESYREL) 100 MG tablet 50-100 mg at night as needed for sleep 30 tablet 2  . Vitamin D, Ergocalciferol, (DRISDOL) 50000 units CAPS capsule Take 50,000 Units by mouth every 7 (seven) days.    . ondansetron (ZOFRAN) 4 MG tablet Take 1 tablet (4 mg total) by mouth every 8 (eight) hours as needed for nausea or vomiting. 60 tablet 1   No current facility-administered medications for this visit.     Allergies as of 04/10/2018 - Review Complete 04/10/2018  Allergen Reaction Noted  . Metrizamide Rash and Swelling 10/17/2010  . Shellfish allergy Swelling 10/29/2013  . Codeine Other (See Comments)   . Hydrocodone Itching 12/03/2012  . Ivp dye [iodinated diagnostic agents] Rash 10/17/2010  . Latex Rash 03/08/2010    Family History  Problem Relation Age of Onset  . Crohn's disease Maternal Aunt   . Breast cancer Maternal Grandmother   . Cancer Maternal Grandmother   . Colon cancer Maternal Grandfather        3  . Pancreatic cancer Maternal Grandfather   . Cancer Maternal Grandfather   . Crohn's disease Cousin        x 2 cousins  . Breast cancer Daughter 70  . Arthritis Mother   . COPD Mother   . Depression Mother   . Drug abuse Mother   . Heart disease Mother   . Hypertension Mother   . Hyperlipidemia Mother   . Learning disabilities Mother   . Mental illness Mother   . Alcohol abuse Mother   . Colon polyps Mother 42       tubular adenoma  . Early death Father 106       murder  . Alcohol abuse Father   . Drug abuse Father   . Learning disabilities Father   . Cancer Paternal Grandmother   . Alcohol abuse Paternal Grandmother   . Cancer Paternal Grandfather   . Alcohol abuse Maternal Uncle   . Alcohol abuse Paternal Uncle   . Drug abuse Paternal Uncle     Social History   Socioeconomic History  . Marital  status: Married    Spouse name: Not on file  . Number of children: Not on file  . Years of education: Not on file  . Highest education level: Not on file  Occupational History  . Occupation: disabled    Fish farm manager: NOT EMPLOYED  Social Needs  . Financial resource strain: Not on file  . Food insecurity:    Worry: Not on file    Inability: Not on file  .  Transportation needs:    Medical: Not on file    Non-medical: Not on file  Tobacco Use  . Smoking status: Current Some Day Smoker    Packs/day: 1.50    Years: 26.00    Pack years: 39.00    Types: Cigarettes    Start date: 05/03/1984  . Smokeless tobacco: Never Used  Substance and Sexual Activity  . Alcohol use: Not Currently    Alcohol/week: 1.0 standard drinks    Types: 1 Cans of beer per week    Comment: occasionally  . Drug use: No  . Sexual activity: Not Currently    Birth control/protection: Surgical  Lifestyle  . Physical activity:    Days per week: Not on file    Minutes per session: Not on file  . Stress: Not on file  Relationships  . Social connections:    Talks on phone: Not on file    Gets together: Not on file    Attends religious service: Not on file    Active member of club or organization: Not on file    Attends meetings of clubs or organizations: Not on file    Relationship status: Not on file  Other Topics Concern  . Not on file  Social History Narrative  . Not on file    Review of Systems: Gen: Denies fever, chills, anorexia. Denies fatigue, weakness, weight loss.  CV: Denies chest pain, palpitations, syncope, peripheral edema, and claudication. Resp: Denies dyspnea at rest, cough, wheezing, coughing up blood, and pleurisy. GI: see HPI Derm: Denies rash, itching, dry skin Psych: Denies depression, anxiety, memory loss, confusion. No homicidal or suicidal ideation.  Heme: Denies bruising, bleeding, and enlarged lymph nodes.  Physical Exam: BP 126/90   Pulse 93   Temp (!) 97 F (36.1 C)  (Oral)   Ht 5\' 6"  (1.676 m)   Wt 234 lb 6.4 oz (106.3 kg)   BMI 37.83 kg/m  General:   Alert and oriented. No distress noted. Pleasant and cooperative.  Head:  Normocephalic and atraumatic. Eyes:  Conjuctiva clear without scleral icterus. Mouth:  Oral mucosa pink and moist.  Abdomen:  +BS, soft, mild left-sided tenderness and non-distended. No rebound or guarding. No HSM or masses noted. Msk:  Symmetrical without gross deformities. Normal posture. Extremities:  Without edema. Neurologic:  Alert and  oriented x4 Psych:  Alert and cooperative. Normal mood and affect.

## 2018-04-10 ENCOUNTER — Ambulatory Visit (INDEPENDENT_AMBULATORY_CARE_PROVIDER_SITE_OTHER): Payer: PRIVATE HEALTH INSURANCE | Admitting: Gastroenterology

## 2018-04-10 ENCOUNTER — Encounter: Payer: Self-pay | Admitting: Gastroenterology

## 2018-04-10 VITALS — BP 126/90 | HR 93 | Temp 97.0°F | Ht 66.0 in | Wt 234.4 lb

## 2018-04-10 DIAGNOSIS — K2100 Gastro-esophageal reflux disease with esophagitis, without bleeding: Secondary | ICD-10-CM

## 2018-04-10 DIAGNOSIS — R1012 Left upper quadrant pain: Secondary | ICD-10-CM

## 2018-04-10 DIAGNOSIS — Z8371 Family history of colonic polyps: Secondary | ICD-10-CM

## 2018-04-10 MED ORDER — ONDANSETRON HCL 4 MG PO TABS: 4.0000 mg | ORAL_TABLET | Freq: Three times a day (TID) | ORAL | 1 refills | Status: DC | PRN

## 2018-04-10 NOTE — Patient Instructions (Signed)
Please have blood work done.   I would like for you to stop Protonix and start Dexilant just once daily. Let me know if this helps with your upper abdominal pain. I have also sent in Zofran to your pharmacy for nausea.  We will see what the blood work shows and go from there. If you start having diarrhea, let me know and we will check your stool.  After we review labs, we will likely be setting you up for a colonoscopy due to family history of polyps.  I enjoyed seeing you again today! As you know, I value our relationship and want to provide genuine, compassionate, and quality care. I welcome your feedback. If you receive a survey regarding your visit,  I greatly appreciate you taking time to fill this out. See you next time!  Annitta Needs, PhD, ANP-BC [facility]

## 2018-04-11 ENCOUNTER — Encounter: Payer: Self-pay | Admitting: Neurology

## 2018-04-11 ENCOUNTER — Ambulatory Visit (INDEPENDENT_AMBULATORY_CARE_PROVIDER_SITE_OTHER): Payer: PRIVATE HEALTH INSURANCE | Admitting: Neurology

## 2018-04-11 ENCOUNTER — Other Ambulatory Visit: Payer: Self-pay

## 2018-04-11 VITALS — BP 132/86 | HR 72 | Resp 18 | Ht 66.0 in | Wt 234.0 lb

## 2018-04-11 DIAGNOSIS — R413 Other amnesia: Secondary | ICD-10-CM

## 2018-04-11 DIAGNOSIS — G40909 Epilepsy, unspecified, not intractable, without status epilepticus: Secondary | ICD-10-CM | POA: Diagnosis not present

## 2018-04-11 MED ORDER — LEVETIRACETAM ER 750 MG PO TB24: 750.0000 mg | ORAL_TABLET | Freq: Every day | ORAL | 4 refills | Status: DC

## 2018-04-11 NOTE — Progress Notes (Addendum)
PATIENT: Jennifer [name] DOB: 07/19/1970  Chief Complaint  Patient presents with  . Memory Loss    New room. Here with husband, [name], for eval of sz.   . Seizures  . OTHER    Particia Nearing     HISTORICAL  Jennifer [name] is a 48 year old female, is accompanied by her husband, seen in request by her primary care PA Particia Nearing for evaluation of seizure, memory loss, initial evaluation was on April 11, 2018.  I have reviewed and summarized the referring note from the referring physician.  She had long history of depression, has been on polypharmacy treatment  Buspar 10mg  2 tablets tid, Cymbalta 60mg  daily, Lamotriigine 150mg  qhs, trazodone 100mg  qs.  Also had a history of learning disability, was not able to graduate from high school, had ovarian cancer that was incidental finding, no chemoradiation therapy rate needed, hypertension, diabetes, previous polysubstance abuse, including cocaine,  She started to have seizures since 48 years old, no warning signs, generalized tonic-clonic seizure, was treated with Dilantin, phenobarbital initially, when she switched to Dr. Lily Lovings, her epileptic medication was changed to [place] currently taking extended release 750 mg 3 capsule at nighttime, works very well, she no longer has generalized tonic-clonic seizure, she also has occasionally staring spells, last one was in January 2020, usually triggered by flickering of the light.  She had gradual worsening memory loss, could not keep up with date, misplace things, get confused easily,  Her depression overall is under good control, she also has a history of chronic neck pain, left knee pain,  I was able to review MRI of the brain without contrast in 2011, no evidence of mesial temporal sclerosis, no acute abnormality.  Laboratory evaluations in Jan 2020: CBC, Hg 11.7, CMP, Creat 1.29, LDL 108, normal TSH.  REVIEW OF SYSTEMS: Full 14 system review of systems performed and notable only for as  above All other review of systems were negative.  ALLERGIES: Allergies  Allergen Reactions  . Metrizamide Rash and Swelling    Hands swelling/rash  . Shellfish Allergy Swelling    Swells tongue  . Codeine Other (See Comments)    itching  . Hydrocodone Itching    Rash and itching  . Ivp Dye [Iodinated Diagnostic Agents] Rash    Hands swelling/rash  . Latex Rash    HOME MEDICATIONS: Current Outpatient Medications  Medication Sig Dispense Refill  . busPIRone (BUSPAR) 10 MG tablet Take 2 tablets (20 mg total) by mouth 3 (three) times daily. 180 tablet 2  . cetirizine (ZYRTEC) 10 MG tablet Take 10 mg by mouth every morning.    Marland Kitchen dexlansoprazole (DEXILANT) 60 MG capsule Take 60 mg by mouth daily.    . DULoxetine (CYMBALTA) 60 MG capsule Take 1 capsule (60 mg total) by mouth daily. 30 capsule 2  . fluticasone (FLONASE) 50 MCG/ACT nasal spray Place 1 spray into both nostrils daily.    Marland Kitchen lamoTRIgine (LAMICTAL) 150 MG tablet Take 1 tablet (150 mg total) by mouth daily. 30 tablet 2  . Levetiracetam 750 MG TB24 Take 750 mg by mouth at bedtime.  4  . meloxicam (MOBIC) 7.5 MG tablet Take 1 tablet (7.5 mg total) by mouth daily. 30 tablet 2  . methocarbamol (ROBAXIN) 500 MG tablet Take 500-1,000 mg by mouth daily as needed.     . metoCLOPramide (REGLAN) 5 MG tablet Take 5 mg by mouth 4 (four) times daily -  before meals and at bedtime.    Marland Kitchen  ondansetron (ZOFRAN) 4 MG tablet Take 1 tablet (4 mg total) by mouth every 8 (eight) hours as needed for nausea or vomiting. 60 tablet 1  . oxybutynin (DITROPAN) 5 MG tablet Take 5 mg by mouth 2 (two) times daily.    . polyethylene glycol (MIRALAX / GLYCOLAX) packet Take 17 g by mouth daily as needed.    . traZODone (DESYREL) 100 MG tablet 50-100 mg at night as needed for sleep 30 tablet 2  . Vitamin D, Ergocalciferol, (DRISDOL) 50000 units CAPS capsule Take 50,000 Units by mouth every 7 (seven) days.     No current facility-administered medications for this  visit.     PAST MEDICAL HISTORY: Past Medical History:  Diagnosis Date  . Allergy   . Anemia   . Anxiety   . Arthritis    neck  . Asthma   . Diabetes mellitus without complication ([place])   . GERD (gastroesophageal reflux disease)   . History of flexible sigmoidoscopy 1999   Normal to 60cm,  . Hypertension   . Neuromuscular disorder ([place])   . Ovarian cancer ([place])    skin cancer  . Panic attack   . PTSD (post-traumatic stress disorder)   . S/P endoscopy Feb 2012   Nl, s/p 56-[facility] dilator, biopsy benign  . S/P endoscopy 2008   Mild gastritis  . Seizures ([place])   . Substance abuse ([place])    drug addiction    PAST SURGICAL HISTORY: Past Surgical History:  Procedure Laterality Date  . ABDOMINAL HYSTERECTOMY     ovarian tumors  . CHOLECYSTECTOMY    . COLONOSCOPY  03/01/2011   Rourk-External hemorrhoids/otherwise normal rectum and colon.  . ESOPHAGOGASTRODUODENOSCOPY  2008   gastritis  . ESOPHAGOGASTRODUODENOSCOPY  2012   Moderate sized hiatal hernia, non-H. pylori gastritis  . ESOPHAGOGASTRODUODENOSCOPY N/A 11/17/2013   RMR: Mild erosive reflux esophagitis. Patulous EG junction  . HERNIA REPAIR     Incisional with mesh  . PARTIAL HYSTERECTOMY    . SKIN CANCER EXCISION     left bicep  . TONSILLECTOMY      FAMILY HISTORY: Family History  Problem Relation Age of Onset  . Crohn's disease Maternal Aunt   . Breast cancer Maternal Grandmother   . Cancer Maternal Grandmother   . Colon cancer Maternal Grandfather        75  . Pancreatic cancer Maternal Grandfather   . Cancer Maternal Grandfather   . Crohn's disease Cousin        x 2 cousins  . Breast cancer Daughter 21  . Arthritis Mother   . COPD Mother   . Depression Mother   . Drug abuse Mother   . Heart disease Mother   . Hypertension Mother   . Hyperlipidemia Mother   . Learning disabilities Mother   . Mental illness Mother   . Alcohol abuse Mother   . Colon polyps Mother 9       tubular  adenoma  . Early death Father 48       murder  . Alcohol abuse Father   . Drug abuse Father   . Learning disabilities Father   . Cancer Paternal Grandmother   . Alcohol abuse Paternal Grandmother   . Cancer Paternal Grandfather   . Alcohol abuse Maternal Uncle   . Alcohol abuse Paternal Uncle   . Drug abuse Paternal Uncle     SOCIAL HISTORY: Social History   Socioeconomic History  . Marital status: Married    Spouse name: Not  on file  . Number of children: Not on file  . Years of education: Not on file  . Highest education level: Not on file  Occupational History  . Occupation: disabled    Fish farm manager: NOT EMPLOYED  Social Needs  . Financial resource strain: Not on file  . Food insecurity:    Worry: Not on file    Inability: Not on file  . Transportation needs:    Medical: Not on file    Non-medical: Not on file  Tobacco Use  . Smoking status: Current Some Day Smoker    Packs/day: 1.50    Years: 26.00    Pack years: 39.00    Types: Cigarettes    Start date: 05/03/1984  . Smokeless tobacco: Never Used  Substance and Sexual Activity  . Alcohol use: Not Currently    Alcohol/week: 1.0 standard drinks    Types: 1 Cans of beer per week    Comment: occasionally  . Drug use: No  . Sexual activity: Not Currently    Birth control/protection: Surgical  Lifestyle  . Physical activity:    Days per week: Not on file    Minutes per session: Not on file  . Stress: Not on file  Relationships  . Social connections:    Talks on phone: Not on file    Gets together: Not on file    Attends religious service: Not on file    Active member of club or organization: Not on file    Attends meetings of clubs or organizations: Not on file    Relationship status: Not on file  . Intimate partner violence:    Fear of current or ex partner: Not on file    Emotionally abused: Not on file    Physically abused: Not on file    Forced sexual activity: Not on file  Other Topics Concern  .  Not on file  Social History Narrative  . Not on file     PHYSICAL EXAM   There were no vitals filed for this visit.  Not recorded      There is no height or weight on file to calculate BMI.  PHYSICAL EXAMNIATION:  Gen: NAD, conversant, well nourised, obese, well groomed                     Cardiovascular: Regular rate rhythm, no peripheral edema, warm, nontender. Eyes: Conjunctivae clear without exudates or hemorrhage Neck: Supple, no carotid bruits. Pulmonary: Clear to auscultation bilaterally   NEUROLOGICAL EXAM: MMSE - Mini Mental State Exam 04/11/2018  Orientation to time 3  Orientation to Place 4  Registration 3  Attention/ Calculation 0  Recall 1  Language- name 2 objects 2  Language- repeat 1  Language- follow 3 step command 3  Language- read & follow direction 1  Write a sentence 1  Copy design 1  Total score 20  animal naming 8   CRANIAL NERVES: CN II: Visual fields are full to confrontation.  Pupils are round equal and briskly reactive to light. CN III, IV, VI: extraocular movement are normal. No ptosis. CN V: Facial sensation is intact to pinprick in all 3 divisions bilaterally. Corneal responses are intact.  CN VII: Face is symmetric with normal eye closure and smile. CN VIII: Hearing is normal to rubbing fingers CN IX, X: Palate elevates symmetrically. Phonation is normal. CN XI: Head turning and shoulder shrug are intact CN XII: Tongue is midline with normal movements and no atrophy.  MOTOR:  There is no pronator drift of out-stretched arms. Muscle bulk and tone are normal. Muscle strength is normal.  REFLEXES: Reflexes are 2+ and symmetric at the biceps, triceps, knees, and ankles. Plantar responses are flexor.  SENSORY: Intact to light touch, pinprick, positional sensation and vibratory sensation are intact in fingers and toes.  COORDINATION: Rapid alternating movements and fine finger movements are intact. There is no dysmetria on  finger-to-nose and heel-knee-shin.    GAIT/STANCE: Mildly antalgic   DIAGNOSTIC DATA (LABS, IMAGING, TESTING) - I reviewed patient records, labs, notes, testing and imaging myself where available.   ASSESSMENT AND PLAN  Jennifer [name] is a 48 y.o. female   Memory loss Seizure   Doing well at current dose of levetiracetam ER 750 mg 3 tablets every night, refilled her prescription,  Also taking lamotrigine 150 mg for her mood disorder,  MRI of the brain without contrast, creatinine was elevated,  eeg  Laboratory evaluations, including Keppra, lamotrigine level, B12  Return to clinic with nurse practitioner Judson Roch in 3 months,  Marcial Pacas, M.D. Ph.D.  [facility] [address] [place], [place] [address] Ph: [phone] Fax: [phone]  CC: Terald Sleeper, [place]  Addendum: I was able to review records from [facility] neurology Dr.[place] dated August 9 she denies any seizure activity, she was taking lamotrigine from her psychiatrist, was also seen by nephrologist, complains of worsening anxiety, most recent office visit on March 04, 2018, she complains of neck and shoulder pain, on polypharmacy, Cymbalta 60 mg daily, Keppra XR 750 mg 3 tablets at bedtime, tramadol 50 mg half to 1 tablet at bedtime as needed, Linzess 145 mcg daily, metoclopramide 5 mg 4 times a day, Ditropan XL 5 mg twice daily, Nitrostat as needed, vitamin D2, Neurontin 100 mg twice 3 times a day, Protonix 40 mg daily, Zyrtec 10 mg daily, BuSpar 10 mg 2 tablets twice a day, estradiol 1 mg daily

## 2018-04-12 ENCOUNTER — Telehealth: Payer: Self-pay | Admitting: Internal Medicine

## 2018-04-12 ENCOUNTER — Telehealth: Payer: Self-pay | Admitting: *Deleted

## 2018-04-12 ENCOUNTER — Other Ambulatory Visit: Payer: Self-pay

## 2018-04-12 ENCOUNTER — Telehealth: Payer: Self-pay | Admitting: Neurology

## 2018-04-12 DIAGNOSIS — Z8371 Family history of colonic polyps: Secondary | ICD-10-CM | POA: Insufficient documentation

## 2018-04-12 DIAGNOSIS — R1012 Left upper quadrant pain: Secondary | ICD-10-CM

## 2018-04-12 LAB — RPR: RPR Ser Ql: NONREACTIVE

## 2018-04-12 LAB — VITAMIN B12: Vitamin B-12: 321 pg/mL (ref 232–1245)

## 2018-04-12 NOTE — Telephone Encounter (Signed)
Pt called asking for her lab orders go to [facility] in [place] [facility]). Please call [phone]

## 2018-04-12 NOTE — Assessment & Plan Note (Signed)
Known esophagitis, documented on EGD in 2015. Trial Dexilant. Call with progress report.

## 2018-04-12 NOTE — Assessment & Plan Note (Signed)
Vague LUQ discomfort intermittently for last 2 weeks, chronic nausea and occasional vomiting. Currently on Protonix BID. Ordering labs today and will trial Dexilant samples. Review labs and may or may not need imaging. Last EGD in 2015. May need endoscopy if no improvement. Further recommendations to follow.

## 2018-04-12 NOTE — Assessment & Plan Note (Signed)
Will need colonoscopy in near future due to history of colon polyps ( mother age 48). No concerning lower GI signs/sypmtoms.

## 2018-04-12 NOTE — Telephone Encounter (Signed)
[facility] [place] pending faxed clinical notes

## 2018-04-12 NOTE — Telephone Encounter (Signed)
I was unable to reach the patient by phone and her mailbox was full.  I was able to reach her husband on DPR.  He is aware her lab result was normal and will provide her with this information.

## 2018-04-12 NOTE — Telephone Encounter (Signed)
Lab orders for [facility] placed and faxed to [facility]. Pt is aware of orders faxed and will have labs done Monday.

## 2018-04-12 NOTE — Telephone Encounter (Signed)
-----   Message from Marcial Pacas, MD sent at 04/12/2018  9:28 AM EST ----- Please call patient for normal laboratory result

## 2018-04-15 ENCOUNTER — Telehealth: Payer: Self-pay | Admitting: Neurology

## 2018-04-15 ENCOUNTER — Telehealth: Payer: Self-pay | Admitting: *Deleted

## 2018-04-15 LAB — LEVETIRACETAM LEVEL: Levetiracetam Lvl: 38.5 ug/mL (ref 10.0–40.0)

## 2018-04-15 LAB — LAMOTRIGINE LEVEL: LAMOTRIGINE LVL: NOT DETECTED ug/mL (ref 2.0–20.0)

## 2018-04-15 NOTE — Progress Notes (Signed)
CC'D TO PCP °

## 2018-04-15 NOTE — Telephone Encounter (Signed)
[facility] [place] Josem Kaufmann: [id number] (exp. 04/15/18 to 05/15/18)  Patient is scheduled at [facility] because that is who is in her network for Friday 04/19/18 arrival time is 10:30 AM. I gave her their number of [phone] incase she needed to rs for any reason.

## 2018-04-15 NOTE — Telephone Encounter (Signed)
Patient is just wondering if the recorders from Dr. Jacelyn Grip office was sent over.

## 2018-04-15 NOTE — Telephone Encounter (Signed)
Spoke to patient - she is aware of lab results. 

## 2018-04-15 NOTE — Telephone Encounter (Signed)
-----   Message from Marcial Pacas, MD sent at 04/15/2018 10:13 AM EDT ----- Please call patient for normal laboratory result

## 2018-04-16 ENCOUNTER — Other Ambulatory Visit: Payer: PRIVATE HEALTH INSURANCE

## 2018-04-17 LAB — CBC WITH DIFFERENTIAL/PLATELET
BASOS ABS: 0.1 10*3/uL (ref 0.0–0.2)
Basos: 1 %
EOS (ABSOLUTE): 0.1 10*3/uL (ref 0.0–0.4)
Eos: 1 %
HEMOGLOBIN: 12.5 g/dL (ref 11.1–15.9)
Hematocrit: 39.2 % (ref 34.0–46.6)
Immature Grans (Abs): 0 10*3/uL (ref 0.0–0.1)
Immature Granulocytes: 0 %
LYMPHS: 38 %
Lymphocytes Absolute: 2.8 10*3/uL (ref 0.7–3.1)
MCH: 23.5 pg — ABNORMAL LOW (ref 26.6–33.0)
MCHC: 31.9 g/dL (ref 31.5–35.7)
MCV: 74 fL — ABNORMAL LOW (ref 79–97)
MONOCYTES: 6 %
Monocytes Absolute: 0.4 10*3/uL (ref 0.1–0.9)
Neutrophils Absolute: 4 10*3/uL (ref 1.4–7.0)
Neutrophils: 54 %
Platelets: 308 10*3/uL (ref 150–450)
RBC: 5.31 x10E6/uL — ABNORMAL HIGH (ref 3.77–5.28)
RDW: 16 % — ABNORMAL HIGH (ref 11.7–15.4)
WBC: 7.4 10*3/uL (ref 3.4–10.8)

## 2018-04-17 LAB — LIPASE: LIPASE: 34 U/L (ref 14–72)

## 2018-04-17 LAB — HEPATIC FUNCTION PANEL
ALT: 8 IU/L (ref 0–32)
AST: 20 IU/L (ref 0–40)
Albumin: 4.1 g/dL (ref 3.8–4.8)
Alkaline Phosphatase: 85 IU/L (ref 39–117)
Bilirubin Total: 0.3 mg/dL (ref 0.0–1.2)
Bilirubin, Direct: 0.05 mg/dL (ref 0.00–0.40)
Total Protein: 7.2 g/dL (ref 6.0–8.5)

## 2018-04-17 NOTE — Progress Notes (Signed)
Lipase, HFP, CBC all normal/stable. How is she doing?

## 2018-04-18 ENCOUNTER — Ambulatory Visit (INDEPENDENT_AMBULATORY_CARE_PROVIDER_SITE_OTHER): Payer: PRIVATE HEALTH INSURANCE | Admitting: Orthopaedic Surgery

## 2018-04-18 ENCOUNTER — Encounter (INDEPENDENT_AMBULATORY_CARE_PROVIDER_SITE_OTHER): Payer: Self-pay | Admitting: Orthopaedic Surgery

## 2018-04-18 VITALS — BP 128/74 | HR 76 | Ht 66.0 in | Wt 234.0 lb

## 2018-04-18 DIAGNOSIS — M2242 Chondromalacia patellae, left knee: Secondary | ICD-10-CM

## 2018-04-18 DIAGNOSIS — M25562 Pain in left knee: Secondary | ICD-10-CM | POA: Diagnosis not present

## 2018-04-18 NOTE — Progress Notes (Signed)
Office Visit Note   Patient: Jennifer [name]           Date of Birth: 1970/06/24           MRN: [id number] Visit Date: 04/18/2018              Requested by: Terald Sleeper, PA-C [place], [place] [address] PCP: Terald Sleeper, PA-C   Assessment & Plan: Visit Diagnoses: No diagnosis found.  Plan: We discussed using an exercise bike to help with weight loss which should not bother her knee.  She needs to be careful that she does not gain weight so that she would remain candidate for further knee surgery if she has progression of her knee symptoms.  We discussed straight leg raise and for quadricep strengthening.  She can continue using ice use her meloxicam and follow-up with Dr. Durward Fortes she has further problems.  Follow-Up Instructions: No follow-ups on file.   Orders:  No orders of the defined types were placed in this encounter.  No orders of the defined types were placed in this encounter.     Procedures: No procedures performed   Clinical Data: No additional findings.   Subjective: Chief Complaint  Patient presents with  . Left Knee - Follow-up    HPI 48 year old female returns she had an injection in her left knee by Dr. Durward Fortes 04/03/2018 which is the same knee she had previous arthroscopy on many years ago.  She did well up until 3 days ago started having some increased pain when she turned in the shower and had pain in her knee.  Pain tends to come and go she is noticed slight swelling.  She is used some ice and is also using meloxicam.  No fever or chills.  Patient notes some increased pain in her left groin some around her trochanteric region.  She denies pain with figure 4 motion.  She is not been limping no true locking of her knee.  Review of Systems 14 point systems updated unchanged from 04/03/2018 office visit.  Of note is GI reflux tobacco use obesity with BMI 37 history of DVT, PTSD.   Objective: Vital Signs: BP 128/74   Pulse 76   Ht 5\' 6"   (1.676 m)   Wt 234 lb (106.1 kg)   BMI 37.77 kg/m   Physical Exam Constitutional:      Appearance: She is well-developed.  HENT:     Head: Normocephalic.     Right Ear: External ear normal.     Left Ear: External ear normal.  Eyes:     Pupils: Pupils are equal, round, and reactive to light.  Neck:     Thyroid: No thyromegaly.     Trachea: No tracheal deviation.  Cardiovascular:     Rate and Rhythm: Normal rate.  Pulmonary:     Effort: Pulmonary effort is normal.  Abdominal:     Palpations: Abdomen is soft.  Skin:    General: Skin is warm and dry.  Neurological:     Mental Status: She is alert and oriented to person, place, and time.  Psychiatric:        Behavior: Behavior normal.     Ortho Exam patient has negative logroll to right and left knee negative straight leg raising no sciatic notch tenderness trochanteric bursa is minimally tender.  She has some tenderness palpation around her left knee no effusion some pain with patellofemoral loading.  Mild crepitus with extension she does reach  full extension flexes 120 degrees.  Collateral ligaments are normal Lockman's normal no distal swelling.  Specialty Comments:  No specialty comments available.  Imaging: No results found.   PMFS History: Patient Active Problem List   Diagnosis Date Noted  . Family history of colonic polyps 04/12/2018  . LUQ abdominal pain 04/10/2018  . Chronic pain of left knee 03/22/2018  . Memory loss 03/22/2018  . Joint pain 02/07/2018  . Grade II hemorrhoids 01/18/2018  . Grade III hemorrhoids 12/06/2017  . Rib pain on left side 08/23/2017  . Abdominal pain, epigastric 06/06/2017  . Rectal bleeding 06/06/2017  . Rectal pain 06/06/2017  . PTSD (post-traumatic stress disorder) 05/31/2017  . Mood disorder in conditions classified elsewhere 05/31/2017  . History of deep vein thrombosis (DVT) of lower extremity 12/05/2016  . Tobacco abuse 10/12/2016  . Asthma in adult, mild intermittent,  uncomplicated 79/39/0300  . SUI (stress urinary incontinence, female) 10/12/2016  . Chronic mental illness 10/12/2016  . Hiatal hernia 08/20/2012  . Obesity 11/22/2011  . HTN (hypertension) 09/21/2011  . Chondromalacia of left patella 05/23/2011  . ANEMIA 03/09/2009  . GASTROESOPHAGEAL REFLUX DISEASE, CHRONIC 03/09/2009  . Constipation 03/09/2009  . Seizure disorder ([place]) 03/09/2009   Past Medical History:  Diagnosis Date  . Allergy   . Anemia   . Anxiety   . Arthritis    neck  . Asthma   . Diabetes mellitus without complication ([place])   . GERD (gastroesophageal reflux disease)   . History of flexible sigmoidoscopy 1999   Normal to 60cm,  . Hypertension   . Neuromuscular disorder ([place])   . Ovarian cancer ([place])    skin cancer  . Panic attack   . PTSD (post-traumatic stress disorder)   . S/P endoscopy Feb 2012   Nl, s/p 56-[facility] dilator, biopsy benign  . S/P endoscopy 2008   Mild gastritis  . Seizures ([place])   . Substance abuse ([place])    drug addiction    Family History  Problem Relation Age of Onset  . Crohn's disease Maternal Aunt   . Breast cancer Maternal Grandmother   . Cancer Maternal Grandmother   . Colon cancer Maternal Grandfather        49  . Pancreatic cancer Maternal Grandfather   . Cancer Maternal Grandfather   . Crohn's disease Cousin        x 2 cousins  . Breast cancer Daughter 66  . Arthritis Mother   . COPD Mother   . Depression Mother   . Drug abuse Mother   . Heart disease Mother   . Hypertension Mother   . Hyperlipidemia Mother   . Learning disabilities Mother   . Mental illness Mother   . Alcohol abuse Mother   . Colon polyps Mother 39       tubular adenoma  . Early death Father 39       murder  . Alcohol abuse Father   . Drug abuse Father   . Learning disabilities Father   . Cancer Paternal Grandmother   . Alcohol abuse Paternal Grandmother   . Cancer Paternal Grandfather   . Alcohol abuse Maternal Uncle   . Alcohol  abuse Paternal Uncle   . Drug abuse Paternal Uncle     Past Surgical History:  Procedure Laterality Date  . ABDOMINAL HYSTERECTOMY     ovarian tumors  . CHOLECYSTECTOMY    . COLONOSCOPY  03/01/2011   Rourk-External hemorrhoids/otherwise normal rectum and colon.  . ESOPHAGOGASTRODUODENOSCOPY  2008  gastritis  . ESOPHAGOGASTRODUODENOSCOPY  2012   Moderate sized hiatal hernia, non-H. pylori gastritis  . ESOPHAGOGASTRODUODENOSCOPY N/A 11/17/2013   RMR: Mild erosive reflux esophagitis. Patulous EG junction  . HERNIA REPAIR     Incisional with mesh  . PARTIAL HYSTERECTOMY    . SKIN CANCER EXCISION     left bicep  . TONSILLECTOMY     Social History   Occupational History  . Occupation: disabled    Fish farm manager: NOT EMPLOYED  Tobacco Use  . Smoking status: Current Some Day Smoker    Packs/day: 1.50    Years: 26.00    Pack years: 39.00    Types: Cigarettes    Start date: 05/03/1984  . Smokeless tobacco: Never Used  Substance and Sexual Activity  . Alcohol use: Not Currently    Alcohol/week: 1.0 standard drinks    Types: 1 Cans of beer per week    Comment: occasionally  . Drug use: No  . Sexual activity: Not Currently    Birth control/protection: Surgical

## 2018-04-19 ENCOUNTER — Ambulatory Visit (HOSPITAL_COMMUNITY)
Admission: RE | Admit: 2018-04-19 | Discharge: 2018-04-19 | Disposition: A | Discharge status: Home / Self Care | Payer: PRIVATE HEALTH INSURANCE | Source: Ambulatory Visit | Attending: Neurology | Admitting: Neurology

## 2018-04-19 ENCOUNTER — Other Ambulatory Visit: Payer: Self-pay

## 2018-04-19 DIAGNOSIS — R413 Other amnesia: Secondary | ICD-10-CM

## 2018-04-22 ENCOUNTER — Telehealth: Payer: Self-pay | Admitting: *Deleted

## 2018-04-22 NOTE — Telephone Encounter (Signed)
-----   Message from Marcial Pacas, MD sent at 04/22/2018 10:19 AM EDT ----- Please call pt for normal MRI brain.

## 2018-04-22 NOTE — Telephone Encounter (Signed)
I called the patient and informed her that her MRI brain is normal. She verbalized understanding and appreciation. She did not have any questions.

## 2018-04-23 ENCOUNTER — Other Ambulatory Visit: Payer: Self-pay | Admitting: Family Medicine

## 2018-04-23 DIAGNOSIS — M25552 Pain in left hip: Secondary | ICD-10-CM

## 2018-04-26 NOTE — Progress Notes (Signed)
Let's provide samples of Linzess 145 mcg to take daily, 30 minutes before breakfast. Progress report in 1 week.

## 2018-04-29 NOTE — Progress Notes (Signed)
Noted. She can take the Benefiber daily. Miralax on any given day no BM. Glad to hear she is staying put during this and good to know things were better after BM.

## 2018-05-06 ENCOUNTER — Telehealth: Payer: Self-pay | Admitting: *Deleted

## 2018-05-06 ENCOUNTER — Other Ambulatory Visit (HOSPITAL_COMMUNITY): Payer: Self-pay | Admitting: Psychiatry

## 2018-05-06 MED ORDER — TRAZODONE HCL 100 MG PO TABS: ORAL_TABLET | ORAL | 0 refills | Status: DC

## 2018-05-06 MED ORDER — BUSPIRONE HCL 10 MG PO TABS: 20.0000 mg | ORAL_TABLET | Freq: Three times a day (TID) | ORAL | 0 refills | Status: DC

## 2018-05-06 MED ORDER — LAMOTRIGINE 150 MG PO TABS: 150.0000 mg | ORAL_TABLET | Freq: Every day | ORAL | 0 refills | Status: DC

## 2018-05-06 MED ORDER — DULOXETINE HCL 60 MG PO CPEP: 60.0000 mg | ORAL_CAPSULE | Freq: Every day | ORAL | 0 refills | Status: DC

## 2018-05-06 NOTE — Telephone Encounter (Signed)
Pt has question about last appt, and medication. Please call [phone].

## 2018-05-06 NOTE — Telephone Encounter (Signed)
I returned the call to the patient.  She just wanted to confirm that Dr. Krista Blue received records from Dr. Merlene Laughter.  Dr. Krista Blue made an addendum to the patient's last office notes that these records were received.  The patient is aware.

## 2018-05-09 ENCOUNTER — Other Ambulatory Visit: Payer: PRIVATE HEALTH INSURANCE

## 2018-05-21 ENCOUNTER — Other Ambulatory Visit: Payer: Self-pay

## 2018-05-21 ENCOUNTER — Ambulatory Visit (INDEPENDENT_AMBULATORY_CARE_PROVIDER_SITE_OTHER): Payer: PRIVATE HEALTH INSURANCE | Admitting: Physician Assistant

## 2018-05-21 DIAGNOSIS — M25552 Pain in left hip: Secondary | ICD-10-CM

## 2018-05-21 DIAGNOSIS — K219 Gastro-esophageal reflux disease without esophagitis: Secondary | ICD-10-CM | POA: Diagnosis not present

## 2018-05-21 MED ORDER — FLUTICASONE PROPIONATE 50 MCG/ACT NA SUSP: 1.0000 | Freq: Every day | NASAL | 11 refills | Status: DC

## 2018-05-21 MED ORDER — PANTOPRAZOLE SODIUM 40 MG PO TBEC: 40.0000 mg | DELAYED_RELEASE_TABLET | Freq: Every day | ORAL | 11 refills | Status: DC

## 2018-05-21 MED ORDER — CETIRIZINE HCL 10 MG PO TABS: 10.0000 mg | ORAL_TABLET | Freq: Every morning | ORAL | 3 refills | Status: AC

## 2018-05-21 MED ORDER — OXYBUTYNIN CHLORIDE 5 MG PO TABS: 5.0000 mg | ORAL_TABLET | Freq: Two times a day (BID) | ORAL | 11 refills | Status: DC

## 2018-05-21 MED ORDER — MELOXICAM 7.5 MG PO TABS: ORAL_TABLET | ORAL | 3 refills | Status: DC

## 2018-05-21 MED ORDER — VITAMIN D (ERGOCALCIFEROL) 1.25 MG (50000 UNIT) PO CAPS: 50000.0000 [IU] | ORAL_CAPSULE | ORAL | 11 refills | Status: DC

## 2018-05-21 MED ORDER — TRAZODONE HCL 100 MG PO TABS: ORAL_TABLET | ORAL | 0 refills | Status: DC
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
## 2018-05-27 ENCOUNTER — Encounter: Payer: Self-pay | Admitting: Physician Assistant

## 2018-05-27 NOTE — Progress Notes (Signed)
Telephone visit  Subjective: CC: GERD, hip pain PCP: Terald Sleeper, PA-C Jennifer [name] is a 48 y.o. female calls for telephone consult today. Patient provides verbal consent for consult held via phone.  Patient is identified with 2 separate identifiers.  At this time the entire area is on COVID-19 social distancing and stay home orders are in place.  Patient is of higher risk and therefore we are performing this by a virtual method.   Location of patient: Home Location of provider: [facility] Others present for call: No  This patient is having a phone call for a 89-month recheck.  She continues with GERD and does need some refills.  She is having a lot of trouble with her allergies and does need refills on that.  She also continues with left hip pain.  Reviewed all of her medications and updated refills that are needed.  She does have some specialist appointments.  Overall she states that she is fairly controlled.   ROS: Per HPI  Allergies  Allergen Reactions  . Metrizamide Rash and Swelling    Hands swelling/rash  . Shellfish Allergy Swelling    Swells tongue  . Codeine Other (See Comments)    itching  . Hydrocodone Itching    Rash and itching  . Ivp Dye [Iodinated Diagnostic Agents] Rash    Hands swelling/rash  . Latex Rash   Past Medical History:  Diagnosis Date  . Allergy   . Anemia   . Anxiety   . Arthritis    neck  . Asthma   . Diabetes mellitus without complication ([place])   . GERD (gastroesophageal reflux disease)   . History of flexible sigmoidoscopy 1999   Normal to 60cm,  . Hypertension   . Neuromuscular disorder ([place])   . Ovarian cancer ([place])    skin cancer  . Panic attack   . PTSD (post-traumatic stress disorder)   . S/P endoscopy Feb 2012   Nl, s/p 56-[facility] dilator, biopsy benign  . S/P endoscopy 2008   Mild gastritis  . Seizures ([place])   . Substance abuse ([place])    drug addiction    Current Outpatient Medications:  .  [START ON  05/28/2018] busPIRone (BUSPAR) 10 MG tablet, Take 2 tablets (20 mg total) by mouth 3 (three) times daily., Disp: 180 tablet, Rfl: 0 .  cetirizine (ZYRTEC) 10 MG tablet, Take 1 tablet (10 mg total) by mouth every morning., Disp: 90 tablet, Rfl: 3 .  dexlansoprazole (DEXILANT) 60 MG capsule, Take 60 mg by mouth daily., Disp: , Rfl:  .  [START ON 05/28/2018] DULoxetine (CYMBALTA) 60 MG capsule, Take 1 capsule (60 mg total) by mouth daily., Disp: 30 capsule, Rfl: 0 .  fluticasone (FLONASE) 50 MCG/ACT nasal spray, Place 1 spray into both nostrils daily., Disp: 16 g, Rfl: 11 .  [START ON 05/28/2018] lamoTRIgine (LAMICTAL) 150 MG tablet, Take 1 tablet (150 mg total) by mouth daily., Disp: 30 tablet, Rfl: 0 .  Levetiracetam 750 MG TB24, Take 1 tablet (750 mg total) by mouth at bedtime. (Patient taking differently: Take 2,250 mg by mouth at bedtime. ), Disp: 270 tablet, Rfl: 4 .  meloxicam (MOBIC) 7.5 MG tablet, TAKE (1) TABLET BY MOUTH ONCE DAILY., Disp: 30 tablet, Rfl: 3 .  methocarbamol (ROBAXIN) 500 MG tablet, Take 500-1,000 mg by mouth daily as needed. , Disp: , Rfl:  .  metoCLOPramide (REGLAN) 5 MG tablet, Take 5 mg by mouth 4 (four) times daily -  before meals  and at bedtime., Disp: , Rfl:  .  ondansetron (ZOFRAN) 4 MG tablet, Take 1 tablet (4 mg total) by mouth every 8 (eight) hours as needed for nausea or vomiting., Disp: 60 tablet, Rfl: 1 .  oxybutynin (DITROPAN) 5 MG tablet, Take 1 tablet (5 mg total) by mouth 2 (two) times daily., Disp: 60 tablet, Rfl: 11 .  pantoprazole (PROTONIX) 40 MG tablet, Take 1 tablet (40 mg total) by mouth daily., Disp: 30 tablet, Rfl: 11 .  polyethylene glycol (MIRALAX / GLYCOLAX) packet, Take 17 g by mouth daily as needed., Disp: , Rfl:  .  [START ON 05/28/2018] traZODone (DESYREL) 100 MG tablet, 50-100 mg at night as needed for sleep, Disp: 60 tablet, Rfl: 0 .  Vitamin D, Ergocalciferol, (DRISDOL) 1.25 MG (50000 UT) CAPS capsule, Take 1 capsule (50,000 Units total) by mouth  every 7 (seven) days., Disp: 5 capsule, Rfl: 11  Assessment/ Plan: 48 y.o. female   1. Pain of left hip joint - meloxicam (MOBIC) 7.5 MG tablet; TAKE (1) TABLET BY MOUTH ONCE DAILY.  Dispense: 30 tablet; Refill: 3  2. Gastroesophageal reflux disease without esophagitis - pantoprazole (PROTONIX) 40 MG tablet; Take 1 tablet (40 mg total) by mouth daily.  Dispense: 30 tablet; Refill: 11   Start time: 2:17 PM End time: 2:30 PM  Meds ordered this encounter  Medications  . fluticasone (FLONASE) 50 MCG/ACT nasal spray    Sig: Place 1 spray into both nostrils daily.    Dispense:  16 g    Refill:  11    Order Specific Question:   Supervising Provider    Answer:   Janora Norlander [[id number]]  . cetirizine (ZYRTEC) 10 MG tablet    Sig: Take 1 tablet (10 mg total) by mouth every morning.    Dispense:  90 tablet    Refill:  3    Order Specific Question:   Supervising Provider    Answer:   Janora Norlander [[id number]]  . meloxicam (MOBIC) 7.5 MG tablet    Sig: TAKE (1) TABLET BY MOUTH ONCE DAILY.    Dispense:  30 tablet    Refill:  3    Order Specific Question:   Supervising Provider    Answer:   Janora Norlander [[id number]]  . traZODone (DESYREL) 100 MG tablet    Sig: 50-100 mg at night as needed for sleep    Dispense:  60 tablet    Refill:  0    Order Specific Question:   Supervising Provider    Answer:   Janora Norlander [[id number]]  . Vitamin D, Ergocalciferol, (DRISDOL) 1.25 MG (50000 UT) CAPS capsule    Sig: Take 1 capsule (50,000 Units total) by mouth every 7 (seven) days.    Dispense:  5 capsule    Refill:  11    Order Specific Question:   Supervising Provider    Answer:   Janora Norlander [[id number]]  . oxybutynin (DITROPAN) 5 MG tablet    Sig: Take 1 tablet (5 mg total) by mouth 2 (two) times daily.    Dispense:  60 tablet    Refill:  11    Order Specific Question:   Supervising Provider    Answer:   Janora Norlander [[id number]]  . pantoprazole (PROTONIX) 40 MG  tablet    Sig: Take 1 tablet (40 mg total) by mouth daily.    Dispense:  30 tablet    Refill:  11    Order Specific Question:  Supervising Provider    Answer:   Janora Norlander [[id number]]    Particia Nearing PA-C [place] [phone]

## 2018-05-29 ENCOUNTER — Other Ambulatory Visit: Payer: Self-pay | Admitting: Internal Medicine

## 2018-05-29 DIAGNOSIS — K219 Gastro-esophageal reflux disease without esophagitis: Secondary | ICD-10-CM

## 2018-05-30 ENCOUNTER — Other Ambulatory Visit: Payer: Self-pay

## 2018-05-30 ENCOUNTER — Ambulatory Visit (INDEPENDENT_AMBULATORY_CARE_PROVIDER_SITE_OTHER): Payer: PRIVATE HEALTH INSURANCE | Admitting: Psychiatry

## 2018-05-30 ENCOUNTER — Encounter (HOSPITAL_COMMUNITY): Payer: Self-pay | Admitting: Psychiatry

## 2018-05-30 DIAGNOSIS — F0630 Mood disorder due to known physiological condition, unspecified: Secondary | ICD-10-CM | POA: Diagnosis not present

## 2018-05-30 DIAGNOSIS — F4310 Post-traumatic stress disorder, unspecified: Secondary | ICD-10-CM

## 2018-05-30 MED ORDER — LAMOTRIGINE 150 MG PO TABS: 150.0000 mg | ORAL_TABLET | Freq: Every day | ORAL | 0 refills | Status: DC

## 2018-05-30 MED ORDER — DULOXETINE HCL 60 MG PO CPEP: 60.0000 mg | ORAL_CAPSULE | Freq: Every day | ORAL | 0 refills | Status: DC

## 2018-05-30 MED ORDER — BUSPIRONE HCL 10 MG PO TABS: 20.0000 mg | ORAL_TABLET | Freq: Three times a day (TID) | ORAL | 0 refills | Status: DC

## 2018-05-30 NOTE — Patient Instructions (Signed)
1. Continue duloxetine 60 mg daily  2. ContinueBuspar20 mg three times a day 3. Continue lamotrigine 150 mg daily  4. Continuetrazodone 100 mg at night as needed for sleep 5.Next appointment: 7/21, 1:20 for 20 mins

## 2018-06-03 NOTE — Progress Notes (Signed)
Please arrange 2 routine telehealth visit in next few weeks to follow-up on constipation.

## 2018-06-18 ENCOUNTER — Encounter: Payer: Self-pay | Admitting: Gastroenterology

## 2018-06-18 ENCOUNTER — Ambulatory Visit: Payer: Self-pay | Admitting: Gastroenterology

## 2018-06-18 ENCOUNTER — Other Ambulatory Visit: Payer: Self-pay

## 2018-06-18 NOTE — Progress Notes (Signed)
Patient was rescheduled to 5/13.

## 2018-06-19 ENCOUNTER — Other Ambulatory Visit: Payer: Self-pay

## 2018-06-19 ENCOUNTER — Telehealth: Payer: Self-pay | Admitting: Neurology

## 2018-06-19 ENCOUNTER — Ambulatory Visit (INDEPENDENT_AMBULATORY_CARE_PROVIDER_SITE_OTHER): Payer: PRIVATE HEALTH INSURANCE | Admitting: Gastroenterology

## 2018-06-19 ENCOUNTER — Encounter: Payer: Self-pay | Admitting: Gastroenterology

## 2018-06-19 ENCOUNTER — Telehealth: Payer: Self-pay

## 2018-06-19 DIAGNOSIS — R1012 Left upper quadrant pain: Secondary | ICD-10-CM | POA: Diagnosis not present

## 2018-06-19 DIAGNOSIS — K2100 Gastro-esophageal reflux disease with esophagitis, without bleeding: Secondary | ICD-10-CM

## 2018-06-19 DIAGNOSIS — Z8371 Family history of colonic polyps: Secondary | ICD-10-CM

## 2018-06-19 DIAGNOSIS — K5900 Constipation, unspecified: Secondary | ICD-10-CM

## 2018-06-19 MED ORDER — PEG 3350-KCL-NA BICARB-NACL 420 G PO SOLR: 4000.0000 mL | ORAL | 0 refills | Status: DC

## 2018-06-19 MED ORDER — LINACLOTIDE 145 MCG PO CAPS: 145.0000 ug | ORAL_CAPSULE | Freq: Every day | ORAL | 3 refills | Status: DC

## 2018-06-19 NOTE — Progress Notes (Signed)
Primary Care Physician:  Terald Sleeper, PA-C  Primary GI: Dr. Gala Romney   Virtual Visit via Telephone Note Due to COVID-19, visit is conducted virtually and was requested by patient.   I connected with Jennifer [name] on 06/19/18 at  3:30 PM EDT by telephone and verified that I am speaking with the correct person using two identifiers.   I discussed the limitations, risks, security and privacy concerns of performing an evaluation and management service by telephone and the availability of in person appointments. I also discussed with the patient that there may be a patient responsible charge related to this service. The patient expressed understanding and agreed to proceed.  Chief Complaint  Patient presents with  . Constipation    BM's QOD, little straining, no blood in stool  . Nausea     History of Present Illness: 48 year old female with history of constipation and GERD, s/p hemorrhoid banding last year. EGD October 2015, patulous EG junction, mild erosive reflux esophagitis.Colonoscopy January 2013, external hemorrhoids but otherwise normal.   Dry cough waking her up at night. Gagging with cough and states she has seen clumpy/white on her pillows. Coughing for about a month. Quit ETOH at [employer].   On Protonix BID. Still with LUQ discomfort. Mobic rarely. Intermittent LUQ pain. Unsure if related to eating. Starts with small portion but still hungry. If eats more, then feels bloated. Feels like she has to drink a lot to get food down. No odynophagia. Globus sensation. Vocal hoarseness. Dinner between 4-5, laying down 930 and 10pm.   Nausea in the mornings and sometimes in the evening. Intermittent vomiting. No melena or hematochezia. Fiber supplement daily. +constipation. Zofran helps with nausea. Linzess 145 helped in past she believes. Had to stop due to insurance coverage possibly.   Past Medical History:  Diagnosis Date  . Allergy   . Anemia   . Anxiety   . Arthritis     neck  . Asthma   . Diabetes mellitus without complication ([place])   . GERD (gastroesophageal reflux disease)   . History of flexible sigmoidoscopy 1999   Normal to 60cm,  . Hypertension   . Neuromuscular disorder ([place])   . Ovarian cancer ([place])    skin cancer  . Panic attack   . PTSD (post-traumatic stress disorder)   . S/P endoscopy Feb 2012   Nl, s/p [facility] dilator, biopsy benign  . S/P endoscopy 2008   Mild gastritis  . Seizures ([place])   . Substance abuse ([place])    drug addiction     Past Surgical History:  Procedure Laterality Date  . ABDOMINAL HYSTERECTOMY     ovarian tumors  . CHOLECYSTECTOMY    . COLONOSCOPY  03/01/2011   [place]-External hemorrhoids/otherwise normal rectum and colon.  . ESOPHAGOGASTRODUODENOSCOPY  2008   gastritis  . ESOPHAGOGASTRODUODENOSCOPY  2012   Moderate sized hiatal hernia, non-H. pylori gastritis  . ESOPHAGOGASTRODUODENOSCOPY N/A 11/17/2013   RMR: Mild erosive reflux esophagitis. Patulous EG junction  . HERNIA REPAIR     Incisional with mesh  . PARTIAL HYSTERECTOMY    . SKIN CANCER EXCISION     left bicep  . TONSILLECTOMY       Current Meds  Medication Sig  . [START ON 08/06/2018] busPIRone (BUSPAR) 10 MG tablet Take 2 tablets (20 mg total) by mouth 3 (three) times daily.  . cetirizine (ZYRTEC) 10 MG tablet Take 1 tablet (10 mg total) by mouth every morning.  Marland Kitchen dexlansoprazole (DEXILANT) 60  MG capsule Take 60 mg by mouth daily.  . DULoxetine (CYMBALTA) 60 MG capsule Take 1 capsule (60 mg total) by mouth daily.  . fluticasone (FLONASE) 50 MCG/ACT nasal spray Place 1 spray into both nostrils daily.  Marland Kitchen lamoTRIgine (LAMICTAL) 150 MG tablet Take 1 tablet (150 mg total) by mouth daily.  . Levetiracetam 750 MG TB24 Take 1 tablet (750 mg total) by mouth at bedtime.  . meloxicam (MOBIC) 7.5 MG tablet TAKE (1) TABLET BY MOUTH ONCE DAILY.  . methocarbamol (ROBAXIN) 500 MG tablet Take 500-1,000 mg by mouth daily as needed.   .  metoCLOPramide (REGLAN) 5 MG tablet Take 5 mg by mouth 4 (four) times daily -  before meals and at bedtime.  . ondansetron (ZOFRAN) 4 MG tablet Take 1 tablet (4 mg total) by mouth every 8 (eight) hours as needed for nausea or vomiting.  Marland Kitchen oxybutynin (DITROPAN) 5 MG tablet Take 1 tablet (5 mg total) by mouth 2 (two) times daily.  . polyethylene glycol (MIRALAX / GLYCOLAX) packet Take 17 g by mouth daily as needed.  . traZODone (DESYREL) 100 MG tablet 50-100 mg at night as needed for sleep  . Vitamin D, Ergocalciferol, (DRISDOL) 1.25 MG (50000 UT) CAPS capsule Take 1 capsule (50,000 Units total) by mouth every 7 (seven) days.     Family History  Problem Relation Age of Onset  . Crohn's disease Maternal Aunt   . Breast cancer Maternal Grandmother   . Cancer Maternal Grandmother   . Colon cancer Maternal Grandfather        30  . Pancreatic cancer Maternal Grandfather   . Cancer Maternal Grandfather   . Crohn's disease Cousin        x 2 cousins  . Breast cancer Daughter 44  . Arthritis Mother   . COPD Mother   . Depression Mother   . Drug abuse Mother   . Heart disease Mother   . Hypertension Mother   . Hyperlipidemia Mother   . Learning disabilities Mother   . Mental illness Mother   . Alcohol abuse Mother   . Colon polyps Mother 58       tubular adenoma  . Early death Father 67       murder  . Alcohol abuse Father   . Drug abuse Father   . Learning disabilities Father   . Cancer Paternal Grandmother   . Alcohol abuse Paternal Grandmother   . Cancer Paternal Grandfather   . Alcohol abuse Maternal Uncle   . Alcohol abuse Paternal Uncle   . Drug abuse Paternal Uncle     Social History   Socioeconomic History  . Marital status: Married    Spouse name: [name]  . Number of children: Not on file  . Years of education: Not on file  . Highest education level: 11th grade  Occupational History  . Occupation: disabled    Fish farm manager: NOT EMPLOYED  Social Needs  . Financial  resource strain: Not on file  . Food insecurity:    Worry: Not on file    Inability: Not on file  . Transportation needs:    Medical: Not on file    Non-medical: Not on file  Tobacco Use  . Smoking status: Current Some Day Smoker    Packs/day: 1.50    Years: 26.00    Pack years: 39.00    Types: Cigarettes    Start date: 05/03/1984  . Smokeless tobacco: Never Used  Substance and Sexual Activity  . Alcohol  use: Not Currently    Alcohol/week: 1.0 standard drinks    Types: 1 Cans of beer per week    Comment: occasionally  . Drug use: No  . Sexual activity: Not Currently    Birth control/protection: Surgical  Lifestyle  . Physical activity:    Days per week: Not on file    Minutes per session: Not on file  . Stress: Not on file  Relationships  . Social connections:    Talks on phone: Not on file    Gets together: Not on file    Attends religious service: Not on file    Active member of club or organization: Not on file    Attends meetings of clubs or organizations: Not on file    Relationship status: Not on file  Other Topics Concern  . Not on file  Social History Narrative   Lives at home with her husband.   Right handed.    1-2 cups of coffee 3 times per wk.    Intermittent soda.       Review of Systems: Gen: see HPI CV: Denies chest pain, palpitations, syncope, peripheral edema, and claudication. Resp: Denies dyspnea at rest, cough, wheezing, coughing up blood, and pleurisy. GI: see HPI Derm: Denies rash, itching, dry skin Psych: Denies depression, anxiety, memory loss, confusion. No homicidal or suicidal ideation.  Heme: Denies bruising, bleeding, and enlarged lymph nodes.  Observations/Objective: No distress. Unable to perform physical exam due to telephone encounter. No video available.   Assessment and Plan: 48 year old female with history of chronic GERD, constipation, LUQ abdominal pain, intermittent N/V. Remains on Protonix BID. Difficult historian and  uncertain of exactly what medications she is taking.   Constipation: resume Linzess 145 mcg daily. Sent to pharmacy. Increase fiber. May be contributing to abdominal pain.   N/V: query uncontrolled GERD, add Pepcid in evening if needed, follow GERD diet. With early satiety and LUQ pain, will pursue EGD in near future. No dysphagia. Reglan started at some point, and I have asked her to stop as this is not helping. Zofran as needed for nausea.  Globus sensation: likely uncontrolled GERD. EGD as planned as last was in 2015. Will need ENT if persistent globus sensation and vocal hoarseness.   Family history of colon polyps: mother in her early 31s. Last colonoscopy in Jan 2013. Will pursue colonoscopy due to family history of polyps.  Proceed with TCS +/- EGD with PROPOFOL Dr. Gala Romney in near future: the risks, benefits, and alternatives have been discussed with the patient in detail. The patient states understanding and desires to proceed.   Follow Up Instructions:    I discussed the assessment and treatment plan with the patient. The patient was provided an opportunity to ask questions and all were answered. The patient agreed with the plan and demonstrated an understanding of the instructions.   The patient was advised to call back or seek an in-person evaluation if the symptoms worsen or if the condition fails to improve as anticipated.  I provided 20 minutes of non-face-to-face time during this encounter.  Annitta Needs, PhD, ANP-BC [facility]

## 2018-06-19 NOTE — Telephone Encounter (Signed)
I reviewed medical records from [place] [facility]: Most recent March 14, 2017  Diagnosis: Epilepsy, bipolar disorder, obesity,  Medications Robaxin 500 mg as needed, Keppra XR 750 mg 3 tablets at bedtime, Linzess 145 mcg daily, metoclopramide 5 mg 4 times a day, Ditropan XL 5 mg twice a day, vitamin D, Neurontin 100 mg 3 times daily, Protonix 40 mg daily, Zyrtec 10 mg daily, buspirone 10 mg 2 tablets 3 times daily, estradiol 1 mg daily

## 2018-06-19 NOTE — Telephone Encounter (Signed)
Called pt, TCS-/+EGD w/Propofol w/RMR scheduled for 08/08/18 at 1:15pm. Rx for prep sent to pharmacy. Orders entered.

## 2018-06-19 NOTE — Patient Instructions (Addendum)
We are arranging a colonoscopy and possible upper endoscopy.  Let's stop Reglan as it is not helping your symptoms.  Continue Zofran as needed for nausea.  Continue Dexilant each morning. You may take Pepcid at bedtime if needed.   I have sent in [place] to take each morning, 30 minutes before breakfast. This is for constipation. Continue the fiber daily.  We will see you in 3 months!  I enjoyed talking with you again today! As you know, I value our relationship and want to provide genuine, compassionate, and quality care. I welcome your feedback. If you receive a survey regarding your visit,  I greatly appreciate you taking time to fill this out. See you next time!  Annitta Needs, PhD, ANP-BC [facility]   Food Choices for Gastroesophageal Reflux Disease, Adult When you have gastroesophageal reflux disease (GERD), the foods you eat and your eating habits are very important. Choosing the right foods can help ease your discomfort. Think about working with a nutrition specialist (dietitian) to help you make good choices. What are tips for following this plan?  Meals  Choose healthy foods that are low in fat, such as fruits, vegetables, whole grains, low-fat dairy products, and lean meat, fish, and poultry.  Eat small meals often instead of 3 large meals a day. Eat your meals slowly, and in a place where you are relaxed. Avoid bending over or lying down until 2-3 hours after eating.  Avoid eating meals 2-3 hours before bed.  Avoid drinking a lot of liquid with meals.  Cook foods using methods other than frying. Bake, grill, or broil food instead.  Avoid or limit: ? Chocolate. ? Peppermint or spearmint. ? Alcohol. ? Pepper. ? Black and decaffeinated coffee. ? Black and decaffeinated tea. ? Bubbly (carbonated) soft drinks. ? Caffeinated energy drinks and soft drinks.  Limit high-fat foods such as: ? Fatty meat or fried foods. ? Whole milk, cream, butter, or ice  cream. ? Nuts and nut butters. ? Pastries, donuts, and sweets made with butter or shortening.  Avoid foods that cause symptoms. These foods may be different for everyone. Common foods that cause symptoms include: ? Tomatoes. ? Oranges, lemons, and limes. ? Peppers. ? Spicy food. ? Onions and garlic. ? Vinegar. Lifestyle  Maintain a healthy weight. Ask your doctor what weight is healthy for you. If you need to lose weight, work with your doctor to do so safely.  Exercise for at least 30 minutes for 5 or more days each week, or as told by your doctor.  Wear loose-fitting clothes.  Do not smoke. If you need help quitting, ask your doctor.  Sleep with the head of your bed higher than your feet. Use a wedge under the mattress or blocks under the bed frame to raise the head of the bed. Summary  When you have gastroesophageal reflux disease (GERD), food and lifestyle choices are very important in easing your symptoms.  Eat small meals often instead of 3 large meals a day. Eat your meals slowly, and in a place where you are relaxed.  Limit high-fat foods such as fatty meat or fried foods.  Avoid bending over or lying down until 2-3 hours after eating.  Avoid peppermint and spearmint, caffeine, alcohol, and chocolate. This information is not intended to replace advice given to you by your health care provider. Make sure you discuss any questions you have with your health care provider. Document Released: 07/25/2011 Document Revised: 02/29/2016 Document Reviewed: 02/29/2016 Elsevier Interactive  Patient Education  2019 [employer].

## 2018-06-20 ENCOUNTER — Encounter: Payer: Self-pay | Admitting: Internal Medicine

## 2018-06-24 NOTE — Telephone Encounter (Signed)
Pre-op appt 08/05/18 at 10:00am. Appt letter mailed with procedure instructions.

## 2018-06-25 NOTE — Progress Notes (Signed)
CC'ED TO PCP 

## 2018-07-08 ENCOUNTER — Other Ambulatory Visit: Payer: Self-pay

## 2018-07-08 ENCOUNTER — Telehealth: Payer: Self-pay

## 2018-07-08 ENCOUNTER — Telehealth: Payer: Self-pay | Admitting: Internal Medicine

## 2018-07-08 ENCOUNTER — Ambulatory Visit (INDEPENDENT_AMBULATORY_CARE_PROVIDER_SITE_OTHER): Payer: PRIVATE HEALTH INSURANCE | Admitting: Physician Assistant

## 2018-07-08 DIAGNOSIS — J029 Acute pharyngitis, unspecified: Secondary | ICD-10-CM | POA: Diagnosis not present

## 2018-07-08 MED ORDER — AMOXICILLIN 500 MG PO CAPS: 500.0000 mg | ORAL_CAPSULE | Freq: Three times a day (TID) | ORAL | 0 refills | Status: DC

## 2018-07-08 NOTE — Telephone Encounter (Signed)
Spoke with the patient and they have given verbal consent to file their insurance and to do a doxy.me visit. Mobile number and carrier have been confirmed and sent.  Text: [phone] (Straight Talk)

## 2018-07-08 NOTE — Telephone Encounter (Signed)
Tried calling pt. VM is full, will call pt back. Not able to leave a message.

## 2018-07-08 NOTE — Telephone Encounter (Signed)
[phone]  PLEASE CALL PATIENT SHE SAID THAT HER THROAT IS RED AND SWOLLEN, SHE HAS REFLUX

## 2018-07-08 NOTE — Telephone Encounter (Signed)
email and text sent [email or web address], and [email or web address].

## 2018-07-09 ENCOUNTER — Encounter: Payer: Self-pay | Admitting: Physician Assistant

## 2018-07-09 NOTE — Progress Notes (Signed)
Telephone visit  Subjective: BD:ZHGD throat PCP: Terald Sleeper, PA-C Jennifer [name] is a 48 y.o. female calls for telephone consult today. Patient provides verbal consent for consult held via phone.  Patient is identified with 2 separate identifiers.  At this time the entire area is on COVID-19 social distancing and stay home orders are in place.  Patient is of higher risk and therefore we are performing this by a virtual method.  Location of patient: home Location of provider: HOME Others present for call: no  This patient has had many days of sore throat and postnasal drainage, headache at times and sinus pressure. There is copious drainage at times. Denies any fever at this time. There has been a history of sinus infections in the past.  There is cough at night. It has become more prevalent in recent days.    ROS: Per HPI  Allergies  Allergen Reactions  . Metrizamide Rash and Swelling    Hands swelling/rash  . Shellfish Allergy Swelling    Swells tongue  . Codeine Other (See Comments)    itching  . Hydrocodone Itching    Rash and itching  . Ivp Dye [Iodinated Diagnostic Agents] Rash    Hands swelling/rash  . Latex Rash   Past Medical History:  Diagnosis Date  . Allergy   . Anemia   . Anxiety   . Arthritis    neck  . Asthma   . Diabetes mellitus without complication ([place])   . GERD (gastroesophageal reflux disease)   . History of flexible sigmoidoscopy 1999   Normal to 60cm,  . Hypertension   . Neuromuscular disorder ([place])   . Ovarian cancer ([place])    skin cancer  . Panic attack   . PTSD (post-traumatic stress disorder)   . S/P endoscopy Feb 2012   Nl, s/p 56-[facility] dilator, biopsy benign  . S/P endoscopy 2008   Mild gastritis  . Seizures ([place])   . Substance abuse ([place])    drug addiction    Current Outpatient Medications:  .  amoxicillin (AMOXIL) 500 MG capsule, Take 1 capsule (500 mg total) by mouth 3 (three) times daily., Disp: 30  capsule, Rfl: 0 .  [START ON 08/06/2018] busPIRone (BUSPAR) 10 MG tablet, Take 2 tablets (20 mg total) by mouth 3 (three) times daily., Disp: 180 tablet, Rfl: 0 .  cetirizine (ZYRTEC) 10 MG tablet, Take 1 tablet (10 mg total) by mouth every morning., Disp: 90 tablet, Rfl: 3 .  dexlansoprazole (DEXILANT) 60 MG capsule, Take 60 mg by mouth daily., Disp: , Rfl:  .  DULoxetine (CYMBALTA) 60 MG capsule, Take 1 capsule (60 mg total) by mouth daily., Disp: 90 capsule, Rfl: 0 .  fluticasone (FLONASE) 50 MCG/ACT nasal spray, Place 1 spray into both nostrils daily., Disp: 16 g, Rfl: 11 .  lamoTRIgine (LAMICTAL) 150 MG tablet, Take 1 tablet (150 mg total) by mouth daily., Disp: 90 tablet, Rfl: 0 .  Levetiracetam 750 MG TB24, Take 1 tablet (750 mg total) by mouth at bedtime., Disp: 270 tablet, Rfl: 4 .  linaclotide (LINZESS) 145 MCG CAPS capsule, Take 1 capsule (145 mcg total) by mouth daily before breakfast., Disp: 90 capsule, Rfl: 3 .  meloxicam (MOBIC) 7.5 MG tablet, TAKE (1) TABLET BY MOUTH ONCE DAILY., Disp: 30 tablet, Rfl: 3 .  methocarbamol (ROBAXIN) 500 MG tablet, Take 500-1,000 mg by mouth daily as needed. , Disp: , Rfl:  .  metoCLOPramide (REGLAN) 5 MG tablet, Take 5 mg  by mouth 4 (four) times daily -  before meals and at bedtime., Disp: , Rfl:  .  ondansetron (ZOFRAN) 4 MG tablet, Take 1 tablet (4 mg total) by mouth every 8 (eight) hours as needed for nausea or vomiting., Disp: 60 tablet, Rfl: 1 .  oxybutynin (DITROPAN) 5 MG tablet, Take 1 tablet (5 mg total) by mouth 2 (two) times daily., Disp: 60 tablet, Rfl: 11 .  polyethylene glycol (MIRALAX / GLYCOLAX) packet, Take 17 g by mouth daily as needed., Disp: , Rfl:  .  polyethylene glycol-electrolytes (TRILYTE) 420 g solution, Take 4,000 mLs by mouth as directed., Disp: 4000 mL, Rfl: 0 .  traZODone (DESYREL) 100 MG tablet, 50-100 mg at night as needed for sleep, Disp: 60 tablet, Rfl: 0 .  Vitamin D, Ergocalciferol, (DRISDOL) 1.25 MG (50000 UT) CAPS  capsule, Take 1 capsule (50,000 Units total) by mouth every 7 (seven) days., Disp: 5 capsule, Rfl: 11  Assessment/ Plan: 48 y.o. female   1. Sore throat - amoxicillin (AMOXIL) 500 MG capsule; Take 1 capsule (500 mg total) by mouth 3 (three) times daily.  Dispense: 30 capsule; Refill: 0   Start time: 1:44 PM End time: 1:52 PM  Meds ordered this encounter  Medications  . amoxicillin (AMOXIL) 500 MG capsule    Sig: Take 1 capsule (500 mg total) by mouth 3 (three) times daily.    Dispense:  30 capsule    Refill:  0    Order Specific Question:   Supervising Provider    Answer:   Janora Norlander [[id number]]    Particia Nearing PA-C [place] [phone]

## 2018-07-09 NOTE — Telephone Encounter (Signed)
Called pt and wasn't able to leave a VM. MB is full.

## 2018-07-10 ENCOUNTER — Ambulatory Visit (INDEPENDENT_AMBULATORY_CARE_PROVIDER_SITE_OTHER): Payer: PRIVATE HEALTH INSURANCE | Admitting: Neurology

## 2018-07-10 ENCOUNTER — Encounter: Payer: Self-pay | Admitting: Neurology

## 2018-07-10 ENCOUNTER — Telehealth: Payer: Self-pay | Admitting: Neurology

## 2018-07-10 ENCOUNTER — Other Ambulatory Visit: Payer: Self-pay

## 2018-07-10 DIAGNOSIS — G40909 Epilepsy, unspecified, not intractable, without status epilepticus: Secondary | ICD-10-CM

## 2018-07-10 MED ORDER — LEVETIRACETAM ER 750 MG PO TB24: 2250.0000 mg | ORAL_TABLET | Freq: Every day | ORAL | 4 refills | Status: DC

## 2018-07-10 NOTE — Telephone Encounter (Signed)
Called [facility] and spoke to [place], no PA needed for TCS or EGD.

## 2018-07-10 NOTE — Telephone Encounter (Signed)
Pt returned call stating that she found out what kind of injections she was receiving. She states they were Trigger Point Injections on her shoulders. Please advise.

## 2018-07-10 NOTE — Progress Notes (Signed)
Virtual Visit via Video Note  I connected with Jennifer [name] on 07/10/18 at 11:15 AM EDT by a video enabled telemedicine application and verified that I am speaking with the correct person using two identifiers.   Location: Patient: At her home Provider: In the office    I discussed the limitations of evaluation and management by telemedicine and the availability of in person appointments. The patient expressed understanding and agreed to proceed.  History of Present Illness: Jennifer [name] is a 48 year old female, is accompanied by her husband, seen in request by her primary care PA Particia Nearing for evaluation of seizure, memory loss, initial evaluation was on April 11, 2018.  I have reviewed and summarized the referring note from the referring physician.  She had long history of depression, has been on polypharmacy treatment  Buspar 10mg  2 tablets tid, Cymbalta 60mg  daily, Lamotriigine 150mg  qhs, trazodone 100mg  qs.  Also had a history of learning disability, was not able to graduate from high school, had ovarian cancer that was incidental finding, no chemoradiation therapy rate needed, hypertension, diabetes, previous polysubstance abuse, including cocaine,  She started to have seizures since 48 years old, no warning signs, generalized tonic-clonic seizure, was treated with Dilantin, phenobarbital initially, when she switched to Dr. Lily Lovings, her epileptic medication was changed to [place] currently taking extended release 750 mg 3 capsule at nighttime, works very well, she no longer has generalized tonic-clonic seizure, she also has occasionally staring spells, last one was in January 2020, usually triggered by flickering of the light.  She had gradual worsening memory loss, could not keep up with date, misplace things, get confused easily,  Her depression overall is under good control, she also has a history of chronic neck pain, left knee pain,  I was able to review MRI of the  brain without contrast in 2011, no evidence of mesial temporal sclerosis, no acute abnormality.  Laboratory evaluations in Jan 2020: CBC, Hg 11.7, CMP, Creat 1.29, LDL 108, normal TSH.  Update July 09, 2269 SS: 48 year old female, history of seizures, tonic-clonic in nature when she was younger, as she is aged staring spells, last was in January 2020, she indicates she has been doing well since last visit, has not had recurrent seizure,  She has transferred care from prior neurologist Dr. Merlene Laughter, she reports he was giving her injections in her neck for chronic pain and swelling, she is indicating she is past due for the injection, swelling at her right neck  There is a question about her Keppra, according to records should be taking levetiracetam XR 750 mg tablet, 3 tablets at bedtime, she has only been taking 1 tablet, has there been medication change?  She remains on Lamictal 150 mg daily for mood.   Observations/Objective: Alert, answers questions, follows commands, facial symmetry, speech is slightly slowed, gait is mildly unsteady, reports of pain with bilateral rotation of her neck  Assessment and Plan: 1.  Memory loss -MMSE Feb 2020 20/30 -She indicates her memory is better  2.  Seizure -Start back taking Keppra ER 750 mg 3 tablets at night (talked with Dr. Krista Blue, her dose was decreased for unknown reason, will go back up to prior dose) -Lamotrigine 150 mg for mood disorder -MRI of the brain was unremarkable -I reordered her EEG -laboratory evaluations unremarkable in March 2020 -MMSE 20/06 May 2018  As far as her reports of neck injection from prior neurologist, she will obtain last office note from what was done  and let us know. We will determine if we are able to offer that.   Follow Up Instructions: 4 months for revisit, she will call for apt    I discussed the assessment and treatment plan with the patient. The patient was provided an opportunity to ask questions and all  were answered. The patient agreed with the plan and demonstrated an understanding of the instructions.   The patient was advised to call back or seek an in-person evaluation if the symptoms worsen or if the condition fails to improve as anticipated.  I provided 20 minutes of non-face-to-face time during this encounter.   Evangeline Dakin, DNP  [facility] [address] [place], [place] [address] [phone]

## 2018-07-11 ENCOUNTER — Other Ambulatory Visit: Payer: Self-pay

## 2018-07-11 ENCOUNTER — Encounter: Payer: Self-pay | Admitting: *Deleted

## 2018-07-11 NOTE — Telephone Encounter (Signed)
Opened in error

## 2018-07-11 NOTE — Telephone Encounter (Signed)
Please put on her my schedule for trigger point injection on the day that I come in clinic for face to face evaluations, it is ok to be in July or later

## 2018-07-11 NOTE — Telephone Encounter (Signed)
I spoke to the patient who tells me she needs pain management for her neck discomfort including injections.  She told me her current orthopaedic physician in [place] offers this type of care.  I instructed her to contact his office for further evaluation of pain management.

## 2018-07-11 NOTE — Telephone Encounter (Signed)
Noted. Will you call the patient and let her know. We can discuss this with her at her next visit. She is new to this office, initially seen by Dr. Krista Blue in March 2020 for seizure disorder. She has not been evaluated here for her neck issues. She can be set up for revisit in 4 months with Dr. Krista Blue. If it is muscle strain issue, NSAIDs are recommended, along with heat. She is prescribed meloxicam.

## 2018-07-11 NOTE — Telephone Encounter (Signed)
I called pt and relayed the information to her.  She is wanting to see Dr Krista Blue sooner then 4 months as she is having issues with her neck now.  She has had trigger point injections previously for her neck.  The referral was for sz / memory.  The information sent (did that have information with neck)?  If so, can we make appt relating to neck (consult with Dr. Krista Blue)

## 2018-07-12 ENCOUNTER — Ambulatory Visit (INDEPENDENT_AMBULATORY_CARE_PROVIDER_SITE_OTHER): Payer: PRIVATE HEALTH INSURANCE | Admitting: Physician Assistant

## 2018-07-12 ENCOUNTER — Ambulatory Visit: Payer: PRIVATE HEALTH INSURANCE | Admitting: Neurology

## 2018-07-12 DIAGNOSIS — M549 Dorsalgia, unspecified: Secondary | ICD-10-CM

## 2018-07-12 DIAGNOSIS — J209 Acute bronchitis, unspecified: Secondary | ICD-10-CM

## 2018-07-12 DIAGNOSIS — G8929 Other chronic pain: Secondary | ICD-10-CM

## 2018-07-12 MED ORDER — AZITHROMYCIN 250 MG PO TABS: ORAL_TABLET | ORAL | 0 refills | Status: DC

## 2018-07-12 NOTE — Progress Notes (Signed)
I have reviewed and agreed above plan. 

## 2018-07-12 NOTE — Progress Notes (Signed)
Telephone visit  Subjective: DZ:HGDJ pain and cough PCP: Terald Sleeper, PA-C MEQ:ASTMHD Jennifer [name] is a 48 y.o. female calls for telephone consult today. Patient provides verbal consent for consult held via phone.  Patient is identified with 2 separate identifiers.  At this time the entire area is on COVID-19 social distancing and stay home orders are in place.  Patient is of higher risk and therefore we are performing this by a virtual method.  Location of patient: home Location of provider: [facility] Others present for call: no    Patient complains of having chronic back pain and would like referral back to orthopedist.  She has been to one in [place].  They had done an injection before in her knee she states that overall that is doing well but like to have her back evaluated.  She has daily pain in her back that does go down her lower she denies any specific injury.  She cannot recall if she has had any changes to her back or had any degeneration.  She has had many days of sore throat and postnasal drainage, headache at times and sinus pressure. There is copious drainage at times. Denies any fever at this time. There has been a history of sinus infections in the past.  There is cough at night. It has become more prevalent in recent days.   ROS: Per HPI  Allergies  Allergen Reactions  . Metrizamide Rash and Swelling    Hands swelling/rash  . Shellfish Allergy Swelling    Swells tongue  . Amoxapine And Related     Nausea   . Codeine Other (See Comments)    itching  . Hydrocodone Itching    Rash and itching  . Ivp Dye [Iodinated Diagnostic Agents] Rash    Hands swelling/rash  . Latex Rash   Past Medical History:  Diagnosis Date  . Allergy   . Anemia   . Anxiety   . Arthritis    neck  . Asthma   . Diabetes mellitus without complication ([place])   . GERD (gastroesophageal reflux disease)   . History of flexible sigmoidoscopy 1999   Normal to 60cm,  . Hypertension    . Neuromuscular disorder ([place])   . Ovarian cancer ([place])    skin cancer  . Panic attack   . PTSD (post-traumatic stress disorder)   . S/P endoscopy Feb 2012   Nl, s/p 56-[facility] dilator, biopsy benign  . S/P endoscopy 2008   Mild gastritis  . Seizures ([place])   . Substance abuse ([place])    drug addiction    Current Outpatient Medications:  .  azithromycin (ZITHROMAX Z-PAK) 250 MG tablet, Take as directed, Disp: 6 each, Rfl: 0 .  [START ON 08/06/2018] busPIRone (BUSPAR) 10 MG tablet, Take 2 tablets (20 mg total) by mouth 3 (three) times daily., Disp: 180 tablet, Rfl: 0 .  cetirizine (ZYRTEC) 10 MG tablet, Take 1 tablet (10 mg total) by mouth every morning., Disp: 90 tablet, Rfl: 3 .  dexlansoprazole (DEXILANT) 60 MG capsule, Take 60 mg by mouth daily., Disp: , Rfl:  .  DULoxetine (CYMBALTA) 60 MG capsule, Take 1 capsule (60 mg total) by mouth daily., Disp: 90 capsule, Rfl: 0 .  fluticasone (FLONASE) 50 MCG/ACT nasal spray, Place 1 spray into both nostrils daily., Disp: 16 g, Rfl: 11 .  lamoTRIgine (LAMICTAL) 150 MG tablet, Take 1 tablet (150 mg total) by mouth daily., Disp: 90 tablet, Rfl: 0 .  Levetiracetam 750 MG TB24, Take 3 tablets (2,250 mg total) by mouth at bedtime., Disp: 270 tablet, Rfl: 4 .  linaclotide (LINZESS) 145 MCG CAPS capsule, Take 1 capsule (145 mcg total) by mouth daily before breakfast., Disp: 90 capsule, Rfl: 3 .  meloxicam (MOBIC) 7.5 MG tablet, TAKE (1) TABLET BY MOUTH ONCE DAILY., Disp: 30 tablet, Rfl: 3 .  methocarbamol (ROBAXIN) 500 MG tablet, Take 500-1,000 mg by mouth daily as needed. , Disp: , Rfl:  .  metoCLOPramide (REGLAN) 5 MG tablet, Take 5 mg by mouth 4 (four) times daily -  before meals and at bedtime., Disp: , Rfl:  .  ondansetron (ZOFRAN) 4 MG tablet, Take 1 tablet (4 mg total) by mouth every 8 (eight) hours as needed for nausea or vomiting., Disp: 60 tablet, Rfl: 1 .  oxybutynin (DITROPAN) 5 MG tablet, Take 1 tablet (5 mg total) by mouth 2 (two)  times daily., Disp: 60 tablet, Rfl: 11 .  polyethylene glycol (MIRALAX / GLYCOLAX) packet, Take 17 g by mouth daily as needed., Disp: , Rfl:  .  polyethylene glycol-electrolytes (TRILYTE) 420 g solution, Take 4,000 mLs by mouth as directed., Disp: 4000 mL, Rfl: 0 .  traZODone (DESYREL) 100 MG tablet, 50-100 mg at night as needed for sleep, Disp: 60 tablet, Rfl: 0 .  Vitamin D, Ergocalciferol, (DRISDOL) 1.25 MG (50000 UT) CAPS capsule, Take 1 capsule (50,000 Units total) by mouth every 7 (seven) days., Disp: 5 capsule, Rfl: 11  Assessment/ Plan: 48 y.o. female   1. Other chronic back pain - Ambulatory referral to Orthopedic Surgery  2. Bronchitis, acute, with bronchospasm - azithromycin (ZITHROMAX Z-PAK) 250 MG tablet; Take as directed  Dispense: 6 each; Refill: 0   Start time: 2:05 PM End time: 2:17 PM  Meds ordered this encounter  Medications  . azithromycin (ZITHROMAX Z-PAK) 250 MG tablet    Sig: Take as directed    Dispense:  6 each    Refill:  0    Sore throat    Order Specific Question:   Supervising Provider    Answer:   Janora Norlander [[id number]]    Particia Nearing PA-C [place] [phone]

## 2018-07-17 ENCOUNTER — Encounter: Payer: Self-pay | Admitting: Physician Assistant

## 2018-07-24 ENCOUNTER — Ambulatory Visit: Payer: Self-pay

## 2018-07-24 ENCOUNTER — Telehealth: Payer: Self-pay | Admitting: Orthopaedic Surgery

## 2018-07-24 ENCOUNTER — Encounter: Payer: Self-pay | Admitting: Orthopaedic Surgery

## 2018-07-24 ENCOUNTER — Ambulatory Visit (INDEPENDENT_AMBULATORY_CARE_PROVIDER_SITE_OTHER): Payer: PRIVATE HEALTH INSURANCE | Admitting: Orthopaedic Surgery

## 2018-07-24 ENCOUNTER — Other Ambulatory Visit: Payer: Self-pay

## 2018-07-24 ENCOUNTER — Telehealth: Payer: Self-pay

## 2018-07-24 VITALS — Ht 66.0 in | Wt 230.0 lb

## 2018-07-24 DIAGNOSIS — M542 Cervicalgia: Secondary | ICD-10-CM | POA: Diagnosis not present

## 2018-07-24 NOTE — Telephone Encounter (Signed)
Records from [facility] have been received and placed on Sarah's desk for her to look over.

## 2018-07-24 NOTE — Addendum Note (Signed)
Addended by: Lendon Collar on: 07/24/2018 11:45 AM   Modules accepted: Orders

## 2018-07-24 NOTE — Progress Notes (Signed)
Office Visit Note   Patient: Jennifer [name]           Date of Birth: [birth year]           MRN: [id number] Visit Date: 07/24/2018              Requested by: Terald Sleeper, PA-C [id number] [place],  [place] [address] PCP: Terald Sleeper, PA-C   Assessment & Plan: Visit Diagnoses:  1. Neck pain   2. Cervicalgia     Plan: Chronic pain cervical spine over at least 2 years.  No neurologic symptoms.  Has had a full course of acupuncture and medicines without much relief.  Will order MRI scan of cervical spine and physical therapy at [facility].  Return after MRI scan.  No new medicines  Follow-Up Instructions: Return After MRI scan of cervical spine.   Orders:  Orders Placed This Encounter  Procedures  . XR Cervical Spine 2 or 3 views   No orders of the defined types were placed in this encounter.     Procedures: No procedures performed   Clinical Data: No additional findings.   Subjective: Chief Complaint  Patient presents with  . Neck - Pain  Paitent presents today with neck pain that has been present for more than 2years. No known injury. She said that it feels tight across her shoulders and "puffs up". She gets sharp pains 6/10. She has tried acupuncture, ice, and injections. She has a history of seizures.  Multiple medical problems including anxiety, pre-diabetes and hypertension.  Jennifer [name] is "tired" of taking pills and would like to have some resolution of her chronic neck pain.  She has had a course of acupuncture, over-the-counter medicines and ice.  The pain seems to be localized to her neck without referred pain to either upper extremity.  She is being followed by [facility] for her seizures.  She is not aware of any advanced imaging.  We did check the PACS system under her prior name of [place] and her birthdate and did not see any evidence of CT scans or MRI scans  HPI  Review of Systems   Objective: Vital Signs: Ht 5\' 6"  (1.676 m)   Wt 230 lb  (104.3 kg)   BMI 37.12 kg/m   Physical Exam Constitutional:      Appearance: She is well-developed.  Eyes:     Pupils: Pupils are equal, round, and reactive to light.  Pulmonary:     Effort: Pulmonary effort is normal.  Skin:    General: Skin is warm and dry.  Neurological:     Mental Status: She is alert and oriented to person, place, and time.  Psychiatric:        Behavior: Behavior normal.     Ortho Exam awake alert and oriented x3.  Comfortable sitting.  Did have limited range of motion of cervical spine.  Barely able to touch her chin to her chest.  Only about 50% of normal neck extension and rotation to the right and the left at which point there was posterior cervical pain.  No referred pain to either upper extremity.  Does have some fullness in the supraclavicular region bilaterally but without pain.  Deep tendon reflexes were symmetrical to both upper extremities.  Painless range of motion of both shoulders.  Good grip and good release.  Specialty Comments:  No specialty comments available.  Imaging: Xr Cervical Spine 2 Or 3 Views  Result Date: 07/24/2018 Films  of the cervical spine were obtained in 2 projections.  There is straightening of the normal cervical lordosis.  No ectopic calcification.  No listhesis.  Facet joints appear to be intact.  No significant decrease in the disc space height    PMFS History: Patient Active Problem List   Diagnosis Date Noted  . Cervicalgia 07/24/2018  . Family history of colonic polyps 04/12/2018  . LUQ abdominal pain 04/10/2018  . Chronic pain of left knee 03/22/2018  . Memory loss 03/22/2018  . Joint pain 02/07/2018  . Grade II hemorrhoids 01/18/2018  . Grade III hemorrhoids 12/06/2017  . Rib pain on left side 08/23/2017  . Abdominal pain, epigastric 06/06/2017  . Rectal bleeding 06/06/2017  . Rectal pain 06/06/2017  . PTSD (post-traumatic stress disorder) 05/31/2017  . Mood disorder in conditions classified elsewhere  05/31/2017  . History of deep vein thrombosis (DVT) of lower extremity 12/05/2016  . Tobacco abuse 10/12/2016  . Asthma in adult, mild intermittent, uncomplicated 00/93/8182  . SUI (stress urinary incontinence, female) 10/12/2016  . Chronic mental illness 10/12/2016  . Hiatal hernia 08/20/2012  . Obesity 11/22/2011  . HTN (hypertension) 09/21/2011  . Chondromalacia of left patella 05/23/2011  . ANEMIA 03/09/2009  . GASTROESOPHAGEAL REFLUX DISEASE, CHRONIC 03/09/2009  . Constipation 03/09/2009  . Seizure disorder ([place]) 03/09/2009   Past Medical History:  Diagnosis Date  . Allergy   . Anemia   . Anxiety   . Arthritis    neck  . Asthma   . Diabetes mellitus without complication ([place])   . GERD (gastroesophageal reflux disease)   . History of flexible sigmoidoscopy 1999   Normal to 60cm,  . Hypertension   . Neuromuscular disorder ([place])   . Ovarian cancer ([place])    skin cancer  . Panic attack   . PTSD (post-traumatic stress disorder)   . S/P endoscopy Feb 2012   Nl, s/p 56-[facility] dilator, biopsy benign  . S/P endoscopy 2008   Mild gastritis  . Seizures ([place])   . Substance abuse ([place])    drug addiction    Family History  Problem Relation Age of Onset  . Crohn's disease Maternal Aunt   . Breast cancer Maternal Grandmother   . Cancer Maternal Grandmother   . Colon cancer Maternal Grandfather        68  . Pancreatic cancer Maternal Grandfather   . Cancer Maternal Grandfather   . Crohn's disease Cousin        x 2 cousins  . Breast cancer Daughter 15  . Arthritis Mother   . COPD Mother   . Depression Mother   . Drug abuse Mother   . Heart disease Mother   . Hypertension Mother   . Hyperlipidemia Mother   . Learning disabilities Mother   . Mental illness Mother   . Alcohol abuse Mother   . Colon polyps Mother 57       tubular adenoma  . Early death Father 60       murder  . Alcohol abuse Father   . Drug abuse Father   . Learning disabilities Father   .  Cancer Paternal Grandmother   . Alcohol abuse Paternal Grandmother   . Cancer Paternal Grandfather   . Alcohol abuse Maternal Uncle   . Alcohol abuse Paternal Uncle   . Drug abuse Paternal Uncle     Past Surgical History:  Procedure Laterality Date  . ABDOMINAL HYSTERECTOMY     ovarian tumors  . CHOLECYSTECTOMY    .  COLONOSCOPY  03/01/2011   [place]-External hemorrhoids/otherwise normal rectum and colon.  . ESOPHAGOGASTRODUODENOSCOPY  2008   gastritis  . ESOPHAGOGASTRODUODENOSCOPY  2012   Moderate sized hiatal hernia, non-H. pylori gastritis  . ESOPHAGOGASTRODUODENOSCOPY N/A 11/17/2013   RMR: Mild erosive reflux esophagitis. Patulous EG junction  . HERNIA REPAIR     Incisional with mesh  . PARTIAL HYSTERECTOMY    . SKIN CANCER EXCISION     left bicep  . TONSILLECTOMY     Social History   Occupational History  . Occupation: disabled    Fish farm manager: NOT EMPLOYED  Tobacco Use  . Smoking status: Current Some Day Smoker    Packs/day: 1.50    Years: 26.00    Pack years: 39.00    Types: Cigarettes    Start date: 05/03/1984  . Smokeless tobacco: Never Used  Substance and Sexual Activity  . Alcohol use: Not Currently    Alcohol/week: 1.0 standard drinks    Types: 1 Cans of beer per week    Comment: occasionally  . Drug use: No  . Sexual activity: Not Currently    Birth control/protection: Surgical

## 2018-07-24 NOTE — Telephone Encounter (Signed)
Spoke with patient and her husband on the phone. She was wondering if she shoulder start PT or have the MRI first. Dr.Whitfield advised patient to have the MRI done first, and then pending those results, start PT.

## 2018-07-25 ENCOUNTER — Telehealth: Payer: Self-pay | Admitting: Neurology

## 2018-07-25 NOTE — Telephone Encounter (Signed)
I received records from [facility], 92 pages [phone]) She has been treated by Dr. Merlene Laughter since 2009 for seizures, various other complaints (back pain, headaches).  She has history of epilepsy, depression, schizoaffective disorder, meralgia paresthetica.  In 2009 she was off her seizure medications, had a seizure spell characterized by staring, was restarted on Keppra XR.  She has been on Lamictal in the past, but has been discontinued because of drowsiness.  She was treated with meloxicam for various muscular pain reports.  Since 2014, she has complained of left shoulder muscle spasm, tried Robaxin, referred to chiropractor.  In October 2014 continues to complain of tight shoulder and neck muscle pain, set up for trigger point injections.  Was given trigger point in November 2014 to her bilateral trapezius muscles, lidocaine 2%, 4 cc given to her trapezius muscles.   Over the years of care with his neurologist, had several seizure episodes prompting visits to the ER.  Her typical are described as having staring spell types, unclear which is epileptic versus nonepileptic psychogenic pseudoseizures, given the significant underlying psychiatric problem.   She has been in close consultation with her psychiatrist.  The most current office note that was sent had a date of service September 2016, she was then going to [place] she was taking Keppra XR 750 mg tablet, 3 tablets at bedtime, meloxicam 15 mg tablet daily.
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
## 2018-07-31 ENCOUNTER — Telehealth: Payer: Self-pay

## 2018-07-31 NOTE — Patient Instructions (Addendum)
Your procedure is scheduled on: 08/08/2018  Report to [facility] at    11:45 AM.  Call this number if you have problems the morning of surgery: [phone]   Remember:              Follow Directions on the letter you received from Your Physician's office regarding the Bowel Prep  :  Take these medicines the morning of surgery with A SIP OF WATER: Buspar, Zyrtec, Cymbalta, Flonase and Protonix Upper Endoscopy, Adult, Care After This sheet gives you information about how to care for yourself after your procedure. Your health care provider may also give you more specific instructions. If you have problems or questions, contact your health care provider. What can I expect after the procedure? After the procedure, it is common to have: A sore throat. Mild stomach pain or discomfort. Bloating. Nausea. Follow these instructions at home:  Follow instructions from your health care provider about what to eat or drink after your procedure. Return to your normal activities as told by your health care provider. Ask your health care provider what activities are safe for you. Take over-the-counter and prescription medicines only as told by your health care provider. Do not drive for 24 hours if you were given a sedative during your procedure. Keep all follow-up visits as told by your health care provider. This is important. Contact a health care provider if you have: A sore throat that lasts longer than one day. Trouble swallowing. Get help right away if: You vomit blood or your vomit looks like coffee grounds. You have: A fever. Bloody, black, or tarry stools. A severe sore throat or you cannot swallow. Difficulty breathing. Severe pain in your chest or abdomen. Summary After the procedure, it is common to have a sore throat, mild stomach discomfort, bloating, and nausea. Do not drive for 24 hours if you were given a sedative during the procedure. Follow instructions from your health care  provider about what to eat or drink after your procedure. Return to your normal activities as told by your health care provider. This information is not intended to replace advice given to you by your health care provider. Make sure you discuss any questions you have with your health care provider. Document Released: 07/25/2011 Document Revised: 06/25/2017 Document Reviewed: 06/25/2017 Elsevier Interactive Patient Education  2019 [place] not wear jewelry, make-up or nail polish.    Do not bring valuables to the hospital.  Contacts, dentures or bridgework may not be worn into surgery.  .   Patients discharged the day of surgery will not be allowed to drive home.     Colonoscopy, Adult, Care After This sheet gives you information about how to care for yourself after your procedure. Your health care provider may also give you more specific instructions. If you have problems or questions, contact your health care provider. What can I expect after the procedure? After the procedure, it is common to have:  A small amount of blood in your stool for 24 hours after the procedure.  Some gas.  Mild abdominal cramping or bloating.  Follow these instructions at home: General instructions   For the first 24 hours after the procedure: ? Do not drive or use machinery. ? Do not sign important documents. ? Do not drink alcohol. ? Do your regular daily activities at a slower pace than normal. ? Eat soft, easy-to-digest foods. ? Rest often.  Take over-the-counter or prescription medicines only as  told by your health care provider.  It is up to you to get the results of your procedure. Ask your health care provider, or the department performing the procedure, when your results will be ready. Relieving cramping and bloating  Try walking around when you have cramps or feel bloated.  Apply heat to your abdomen as told by your health care provider. Use a heat source that your health  care provider recommends, such as a moist heat pack or a heating pad. ? Place a towel between your skin and the heat source. ? Leave the heat on for 20-30 minutes. ? Remove the heat if your skin turns bright red. This is especially important if you are unable to feel pain, heat, or cold. You may have a greater risk of getting burned. Eating and drinking  Drink enough fluid to keep your urine clear or pale yellow.  Resume your normal diet as instructed by your health care provider. Avoid heavy or fried foods that are hard to digest.  Avoid drinking alcohol for as long as instructed by your health care provider. Contact a health care provider if:  You have blood in your stool 2-3 days after the procedure. Get help right away if:  You have more than a small spotting of blood in your stool.  You pass large blood clots in your stool.  Your abdomen is swollen.  You have nausea or vomiting.  You have a fever.  You have increasing abdominal pain that is not relieved with medicine. This information is not intended to replace advice given to you by your health care provider. Make sure you discuss any questions you have with your health care provider. Document Released: 09/07/2003 Document Revised: 10/18/2015 Document Reviewed: 04/06/2015 Elsevier Interactive Patient Education  [employer].

## 2018-07-31 NOTE — Telephone Encounter (Signed)
Called patient to schedule EEG. Patient will call back and schedule

## 2018-08-02 ENCOUNTER — Other Ambulatory Visit: Payer: Self-pay

## 2018-08-02 ENCOUNTER — Encounter (HOSPITAL_COMMUNITY): Payer: Self-pay

## 2018-08-02 ENCOUNTER — Other Ambulatory Visit: Payer: Self-pay | Admitting: Physician Assistant

## 2018-08-05 ENCOUNTER — Encounter (HOSPITAL_COMMUNITY): Payer: Self-pay | Admitting: Psychiatry

## 2018-08-05 ENCOUNTER — Other Ambulatory Visit (HOSPITAL_COMMUNITY)
Admission: RE | Admit: 2018-08-05 | Discharge: 2018-08-05 | Disposition: A | Discharge status: Home / Self Care | Payer: PRIVATE HEALTH INSURANCE | Source: Ambulatory Visit | Attending: Internal Medicine | Admitting: Internal Medicine

## 2018-08-05 ENCOUNTER — Inpatient Hospital Stay (HOSPITAL_COMMUNITY)
Admission: RE | Admit: 2018-08-05 | Discharge: 2018-08-05 | Disposition: A | Discharge status: Home / Self Care | Payer: PRIVATE HEALTH INSURANCE | Source: Ambulatory Visit

## 2018-08-05 ENCOUNTER — Other Ambulatory Visit: Payer: Self-pay

## 2018-08-05 ENCOUNTER — Encounter (HOSPITAL_COMMUNITY)
Admission: RE | Admit: 2018-08-05 | Discharge: 2018-08-05 | Disposition: A | Discharge status: Home / Self Care | Payer: PRIVATE HEALTH INSURANCE | Source: Ambulatory Visit | Attending: Internal Medicine | Admitting: Internal Medicine

## 2018-08-05 ENCOUNTER — Ambulatory Visit (INDEPENDENT_AMBULATORY_CARE_PROVIDER_SITE_OTHER): Payer: PRIVATE HEALTH INSURANCE | Admitting: Psychiatry

## 2018-08-05 DIAGNOSIS — Z01812 Encounter for preprocedural laboratory examination: Secondary | ICD-10-CM | POA: Insufficient documentation

## 2018-08-05 DIAGNOSIS — F4310 Post-traumatic stress disorder, unspecified: Secondary | ICD-10-CM | POA: Diagnosis not present

## 2018-08-05 DIAGNOSIS — Z1159 Encounter for screening for other viral diseases: Secondary | ICD-10-CM | POA: Insufficient documentation

## 2018-08-06 ENCOUNTER — Other Ambulatory Visit: Payer: Self-pay | Admitting: Orthopaedic Surgery

## 2018-08-06 ENCOUNTER — Telehealth: Payer: Self-pay | Admitting: Orthopaedic Surgery

## 2018-08-06 LAB — NOVEL CORONAVIRUS, NAA (HOSP ORDER, SEND-OUT TO REF LAB; TAT 18-24 HRS): SARS-CoV-2, NAA: NOT DETECTED

## 2018-08-06 NOTE — Telephone Encounter (Signed)
thanks

## 2018-08-06 NOTE — Telephone Encounter (Signed)
Called and spoke with patient. I explained to her the MRI was denied and insurance wanted her to have therapy first. She declined therapy for now and will think it over. If she changes her mind she will let us know.

## 2018-08-07 ENCOUNTER — Ambulatory Visit (HOSPITAL_COMMUNITY): Payer: PRIVATE HEALTH INSURANCE | Admitting: Psychiatry

## 2018-08-07 ENCOUNTER — Telehealth: Payer: Self-pay | Admitting: *Deleted

## 2018-08-07 NOTE — Telephone Encounter (Signed)
Called patient. Aware we had cancellation tomorrow AM for sooner procedure time at 9:15am. Patient took appt slot. She had me to speak with spouse regarding new prep times. Advised patient will arrive at 7:45am. She will drink 2nd half of prep 4:15am, npo 6:15am. He voiced understanding. Called endo and is aware of appt change

## 2018-08-08 ENCOUNTER — Encounter (HOSPITAL_COMMUNITY): Payer: Self-pay

## 2018-08-08 ENCOUNTER — Ambulatory Visit (HOSPITAL_COMMUNITY)
Admission: RE | Admit: 2018-08-08 | Discharge: 2018-08-08 | Disposition: A | Discharge status: Home / Self Care | Payer: PRIVATE HEALTH INSURANCE | Attending: Internal Medicine | Admitting: Internal Medicine

## 2018-08-08 ENCOUNTER — Ambulatory Visit (HOSPITAL_COMMUNITY): Payer: PRIVATE HEALTH INSURANCE | Admitting: Anesthesiology

## 2018-08-08 ENCOUNTER — Encounter (HOSPITAL_COMMUNITY)
Admission: RE | Disposition: A | Discharge status: Home / Self Care | Payer: Self-pay | Source: Home / Self Care | Attending: Internal Medicine

## 2018-08-08 ENCOUNTER — Other Ambulatory Visit: Payer: Self-pay

## 2018-08-08 DIAGNOSIS — K210 Gastro-esophageal reflux disease with esophagitis: Secondary | ICD-10-CM | POA: Diagnosis not present

## 2018-08-08 DIAGNOSIS — Z1211 Encounter for screening for malignant neoplasm of colon: Secondary | ICD-10-CM

## 2018-08-08 DIAGNOSIS — Z79899 Other long term (current) drug therapy: Secondary | ICD-10-CM | POA: Insufficient documentation

## 2018-08-08 DIAGNOSIS — J45909 Unspecified asthma, uncomplicated: Secondary | ICD-10-CM | POA: Insufficient documentation

## 2018-08-08 DIAGNOSIS — R1012 Left upper quadrant pain: Secondary | ICD-10-CM

## 2018-08-08 DIAGNOSIS — E119 Type 2 diabetes mellitus without complications: Secondary | ICD-10-CM | POA: Insufficient documentation

## 2018-08-08 DIAGNOSIS — K449 Diaphragmatic hernia without obstruction or gangrene: Secondary | ICD-10-CM | POA: Insufficient documentation

## 2018-08-08 DIAGNOSIS — I10 Essential (primary) hypertension: Secondary | ICD-10-CM | POA: Insufficient documentation

## 2018-08-08 DIAGNOSIS — Z888 Allergy status to other drugs, medicaments and biological substances status: Secondary | ICD-10-CM | POA: Diagnosis not present

## 2018-08-08 DIAGNOSIS — F4310 Post-traumatic stress disorder, unspecified: Secondary | ICD-10-CM | POA: Diagnosis not present

## 2018-08-08 DIAGNOSIS — F17210 Nicotine dependence, cigarettes, uncomplicated: Secondary | ICD-10-CM | POA: Diagnosis not present

## 2018-08-08 DIAGNOSIS — Z791 Long term (current) use of non-steroidal anti-inflammatories (NSAID): Secondary | ICD-10-CM | POA: Insufficient documentation

## 2018-08-08 DIAGNOSIS — K209 Esophagitis, unspecified: Secondary | ICD-10-CM

## 2018-08-08 DIAGNOSIS — R1310 Dysphagia, unspecified: Secondary | ICD-10-CM | POA: Diagnosis not present

## 2018-08-08 DIAGNOSIS — Z8371 Family history of colonic polyps: Secondary | ICD-10-CM | POA: Diagnosis not present

## 2018-08-08 DIAGNOSIS — Z885 Allergy status to narcotic agent status: Secondary | ICD-10-CM | POA: Diagnosis not present

## 2018-08-08 HISTORY — PX: MALONEY DILATION: SHX5535

## 2018-08-08 HISTORY — PX: ESOPHAGOGASTRODUODENOSCOPY (EGD) WITH PROPOFOL: SHX5813

## 2018-08-08 HISTORY — PX: COLONOSCOPY WITH PROPOFOL: SHX5780

## 2018-08-08 LAB — GLUCOSE, CAPILLARY: Glucose-Capillary: 99 mg/dL (ref 70–99)

## 2018-08-08 SURGERY — COLONOSCOPY WITH PROPOFOL
Anesthesia: General

## 2018-08-08 MED ORDER — GLYCOPYRROLATE 0.2 MG/ML IJ SOLN
INTRAMUSCULAR | Status: DC | PRN
  Administered 2018-08-08: 0.2 mg via INTRAVENOUS

## 2018-08-08 MED ORDER — CHLORHEXIDINE GLUCONATE CLOTH 2 % EX PADS: 6.0000 | MEDICATED_PAD | Freq: Once | CUTANEOUS | Status: DC

## 2018-08-08 MED ORDER — MIDAZOLAM HCL 5 MG/5ML IJ SOLN
INTRAMUSCULAR | Status: DC | PRN
  Administered 2018-08-08: 2 mg via INTRAVENOUS

## 2018-08-08 MED ORDER — PROMETHAZINE HCL 25 MG/ML IJ SOLN: 6.2500 mg | INTRAMUSCULAR | Status: DC | PRN

## 2018-08-08 MED ORDER — MIDAZOLAM HCL 2 MG/2ML IJ SOLN
INTRAMUSCULAR | Status: AC
  Filled 2018-08-08: qty 2

## 2018-08-08 MED ORDER — MIDAZOLAM HCL 2 MG/2ML IJ SOLN: 0.5000 mg | Freq: Once | INTRAMUSCULAR | Status: DC | PRN

## 2018-08-08 MED ORDER — LACTATED RINGERS IV SOLN
INTRAVENOUS | Status: DC
  Administered 2018-08-08: 09:00:00 via INTRAVENOUS

## 2018-08-08 MED ORDER — PROPOFOL 500 MG/50ML IV EMUL
INTRAVENOUS | Status: DC | PRN
  Administered 2018-08-08: 10:00:00 via INTRAVENOUS
  Administered 2018-08-08: 150 ug/kg/min via INTRAVENOUS

## 2018-08-08 MED ORDER — KETAMINE HCL 50 MG/5ML IJ SOSY
PREFILLED_SYRINGE | INTRAMUSCULAR | Status: AC
  Filled 2018-08-08: qty 5

## 2018-08-08 MED ORDER — PROPOFOL 10 MG/ML IV BOLUS
INTRAVENOUS | Status: DC | PRN
  Administered 2018-08-08: 50 mg via INTRAVENOUS
  Administered 2018-08-08: 40 mg via INTRAVENOUS

## 2018-08-08 MED ORDER — KETAMINE HCL 10 MG/ML IJ SOLN
INTRAMUSCULAR | Status: DC | PRN
  Administered 2018-08-08 (×2): 10 mg via INTRAVENOUS

## 2018-08-08 NOTE — Op Note (Signed)
[facility] Patient Name: Jennifer [name] Procedure Date: 08/08/2018 8:44 AM MRN: [id number] Date of Birth: 02-May-1970 Attending MD: Norvel Richards , MD CSN: [id number] Age: 48 Admit Type: Outpatient Procedure:                Upper GI endoscopy Indications:              Dysphagia, Esophageal reflux symptoms that persist                            despite appropriate therapy Providers:                Norvel Richards, MD, Otis Peak B. Sharon Seller, RN,                            Raphael Gibney, Technician Referring MD:              Medicines:                Propofol per Anesthesia Complications:            No immediate complications. Estimated Blood Loss:     Estimated blood loss: none. Procedure:                Pre-Anesthesia Assessment:                           - Prior to the procedure, a History and Physical                            was performed, and patient medications and                            allergies were reviewed. The patient's tolerance of                            previous anesthesia was also reviewed. The risks                            and benefits of the procedure and the sedation                            options and risks were discussed with the patient.                            All questions were answered, and informed consent                            was obtained. Prior Anticoagulants: The patient has                            taken no previous anticoagulant or antiplatelet                            agents. ASA Grade Assessment: II - A patient with  mild systemic disease. After reviewing the risks                            and benefits, the patient was deemed in                            satisfactory condition to undergo the procedure.                           After obtaining informed consent, the endoscope was                            passed under direct vision. Throughout the                            procedure,  the patient's blood pressure, pulse, and                            oxygen saturations were monitored continuously. The                            [id number]-H190 ([id number]) scope was introduced through the                            mouth, and advanced to the second part of duodenum.                            The upper GI endoscopy was accomplished without                            difficulty. The patient tolerated the procedure                            well. Scope In: 9:18:01 AM Scope Out: 9:21:36 AM Total Procedure Duration: 0 hours 3 minutes 35 seconds  Findings:      Esophagitis was found. Patulous EG junction. Multiple distal esophageal       erosions within 5 mm of the GE junction; no Barrett's epithelium seen      A small hiatal hernia was present.      The exam was otherwise without abnormality.      The duodenal bulb and second portion of the duodenum were normal. . The       scope was withdrawn. Dilation was performed with a Maloney dilator with       mild resistance at 56 Fr. The dilation site was examined following       endoscope reinsertion and showed no change. Estimated blood loss was       minimal. Impression:               -Erosive reflux esophagitis. Dilated.                           - Small hiatal hernia.                           - The examination was  otherwise normal.                           - Normal duodenal bulb and second portion of the                            duodenum.                           - No specimens collected. Moderate Sedation:      Moderate (conscious) sedation was personally administered by an       anesthesia professional. The following parameters were monitored: oxygen       saturation, heart rate, blood pressure, respiratory rate, EKG, adequacy       of pulmonary ventilation, and response to care. Recommendation:           - Patient has a contact number available for                            emergencies. The signs and symptoms of  potential                            delayed complications were discussed with the                            patient. Return to normal activities tomorrow.                            Written discharge instructions were provided to the                            patient.                           - Advance diet as tolerated.                           - Continue present medications. Protonix 40 mg                            orally twice daily.                           - No repeat upper endoscopy for surveillance of                            Barrett's esophagus. See colonoscopy report. Procedure Code(s):        --- Professional ---                           [phone], Esophagogastroduodenoscopy, flexible,                            transoral; diagnostic, including collection of                            specimen(s) by brushing or washing, when  performed                            (separate procedure)                           43450, Dilation of esophagus, by unguided sound or                            bougie, single or multiple passes Diagnosis Code(s):        --- Professional ---                           K20.9, Esophagitis, unspecified                           K44.9, Diaphragmatic hernia without obstruction or                            gangrene                           R13.10, Dysphagia, unspecified                           K21.9, Gastro-esophageal reflux disease without                            esophagitis CPT copyright 2019 American Medical Association. All rights reserved. The codes documented in this report are preliminary and upon coder review may  be revised to meet current compliance requirements. Jennifer [name]. Jennifer Philbert, MD Norvel Richards, MD 08/08/2018 9:50:13 AM This report has been signed electronically. Number of Addenda: 0

## 2018-08-08 NOTE — Anesthesia Postprocedure Evaluation (Signed)
Anesthesia Post Note  Patient: Jennifer [name]  Procedure(s) Performed: COLONOSCOPY WITH PROPOFOL (N/A ) ESOPHAGOGASTRODUODENOSCOPY (EGD) WITH PROPOFOL (N/A ) Devol  Patient location during evaluation: PACU Anesthesia Type: General Level of consciousness: awake and alert and oriented Pain management: pain level controlled Vital Signs Assessment: post-procedure vital signs reviewed and stable Respiratory status: spontaneous breathing Cardiovascular status: blood pressure returned to baseline and stable Postop Assessment: no apparent nausea or vomiting Anesthetic complications: no     Last Vitals:  Vitals:   08/08/18 0835  Pulse: (P) 67  Resp: (P) 16  Temp: (P) 36.8 C  SpO2: 94%    Last Pain:  Vitals:   08/08/18 0911  TempSrc:   PainSc: 0-No pain                 Devean Skoczylas

## 2018-08-08 NOTE — Op Note (Signed)
[facility] Patient Name: Jennifer [name] Procedure Date: 08/08/2018 9:24 AM MRN: [id number] Date of Birth: 1971/01/10 Attending MD: Norvel Richards , MD CSN: [id number] Age: 48 Admit Type: Outpatient Procedure:                Colonoscopy Indications:              Colon cancer screening in patient at increased                            risk: Family history of 1st-degree relative with                            colon polyps Providers:                Norvel Richards, MD, Otis Peak B. Sharon Seller, RN,                            Thomas Hoff., Technician Referring MD:              Medicines:                Propofol per Anesthesia Complications:            No immediate complications. Estimated Blood Loss:     Estimated blood loss: none. Procedure:                Pre-Anesthesia Assessment:                           - Prior to the procedure, a History and Physical                            was performed, and patient medications and                            allergies were reviewed. The patient's tolerance of                            previous anesthesia was also reviewed. The risks                            and benefits of the procedure and the sedation                            options and risks were discussed with the patient.                            All questions were answered, and informed consent                            was obtained. Prior Anticoagulants: The patient has                            taken no previous anticoagulant or antiplatelet  agents. ASA Grade Assessment: II - A patient with                            mild systemic disease. After reviewing the risks                            and benefits, the patient was deemed in                            satisfactory condition to undergo the procedure.                           After obtaining informed consent, the colonoscope                            was passed under direct  vision. Throughout the                            procedure, the patient's blood pressure, pulse, and                            oxygen saturations were monitored continuously. The                            CF-HQ190L ([id number]) scope was introduced through                            the anus and advanced to the the cecum, identified                            by appendiceal orifice and ileocecal valve. The                            colonoscopy was performed without difficulty. The                            patient tolerated the procedure well. The quality                            of the bowel preparation was adequate. Scope In: 9:28:39 AM Scope Out: 9:39:25 AM Scope Withdrawal Time: 0 hours 6 minutes 27 seconds  Total Procedure Duration: 0 hours 10 minutes 46 seconds  Findings:      The perianal and digital rectal examinations were normal.      The colon (entire examined portion) appeared normal.      The retroflexed view of the distal rectum and anal verge was normal and       showed no anal or rectal abnormalities. Impression:               - The entire examined colon is normal.                           - The distal rectum and anal verge are normal on  retroflexion view.                           - No specimens collected. Moderate Sedation:      Moderate (conscious) sedation was personally administered by an       anesthesia professional. The following parameters were monitored: oxygen       saturation, heart rate, blood pressure, respiratory rate, EKG, adequacy       of pulmonary ventilation, and response to care. Recommendation:           - Patient has a contact number available for                            emergencies. The signs and symptoms of potential                            delayed complications were discussed with the                            patient. Return to normal activities tomorrow.                            Written discharge  instructions were provided to the                            patient.                           - Resume previous diet.                           - Repeat colonoscopy in 5 years for screening                            purposes.                           - Return to GI office in 4 months. See EGD report. Procedure Code(s):        --- Professional ---                           [phone], Colonoscopy, flexible; diagnostic, including                            collection of specimen(s) by brushing or washing,                            when performed (separate procedure) Diagnosis Code(s):        --- Professional ---                           Z83.71, Family history of colonic polyps CPT copyright 2019 American Medical Association. All rights reserved. The codes documented in this report are preliminary and upon coder review may  be revised to meet current compliance requirements. Cristopher Estimable. Baptiste Littler, MD Norvel Richards, MD 08/08/2018 9:53:22 AM This report has been signed electronically. Number  of Addenda: 0

## 2018-08-08 NOTE — Transfer of Care (Signed)
Immediate Anesthesia Transfer of Care Note  Patient: Jennifer [name]  Procedure(s) Performed: COLONOSCOPY WITH PROPOFOL (N/A ) ESOPHAGOGASTRODUODENOSCOPY (EGD) WITH PROPOFOL (N/A ) MALONEY DILATION  Patient Location: PACU  Anesthesia Type:General  Level of Consciousness: awake  Airway & Oxygen Therapy: Patient Spontanous Breathing  Post-op Assessment: Report given to RN  Post vital signs: Reviewed and stable  Last Vitals:  Vitals Value Taken Time  BP 96/62 08/08/18 0949  Temp    Pulse 73 08/08/18 0951  Resp 11 08/08/18 0951  SpO2 100 % 08/08/18 0951  Vitals shown include unvalidated device data.  Last Pain:  Vitals:   08/08/18 0911  TempSrc:   PainSc: 0-No pain      Patients Stated Pain Goal: (P) 7 (49/75/30 0511)  Complications: No apparent anesthesia complications

## 2018-08-08 NOTE — Anesthesia Preprocedure Evaluation (Signed)
Anesthesia Evaluation  Patient identified by MRN, date of birth, ID band Patient awake    Reviewed: Allergy & Precautions, NPO status , Patient's Chart, lab work & pertinent test results  Airway Mallampati: III  TM Distance: >3 FB Neck ROM: Full    Dental no notable dental hx. (+) Teeth Intact   Pulmonary asthma , Current Smoker,  Smoker -evasive on amount smoked  States asthma - but hasnt used inalers in years  Denies OSA/COPD   Pulmonary exam normal breath sounds clear to auscultation       Cardiovascular Exercise Tolerance: Good hypertension, Pt. on medications negative cardio ROS Normal cardiovascular examI Rhythm:Regular Rate:Normal  Negative EST 2015 Denies any recent CP Low risk procedure   Neuro/Psych Seizures -, Well Controlled,  PSYCHIATRIC DISORDERS Anxiety Reports last Sz ~2000 On meds  S/p MRI 05/02/18 -without acute pathology - for memory issues  A&O x3 today  Reports Psychiatric stays in the past remotely   Neuromuscular disease    GI/Hepatic Neg liver ROS, hiatal hernia, GERD  Medicated and Controlled,  Endo/Other  negative endocrine ROSdiabetes, Type 2, Oral Hypoglycemic Agents  Renal/GU negative Renal ROS  negative genitourinary   Musculoskeletal  (+) Arthritis , Osteoarthritis,    Abdominal   Peds negative pediatric ROS (+)  Hematology negative hematology ROS (+) anemia ,   Anesthesia Other Findings   Reproductive/Obstetrics negative OB ROS                             Anesthesia Physical Anesthesia Plan  ASA: III  Anesthesia Plan: General   Post-op Pain Management:    Induction: Intravenous  PONV Risk Score and Plan: 2 and Propofol infusion, Treatment may vary due to age or medical condition and Ondansetron  Airway Management Planned: Nasal Cannula and Simple Face Mask  Additional Equipment:   Intra-op Plan:   Post-operative Plan:   Informed  Consent: I have reviewed the patients History and Physical, chart, labs and discussed the procedure including the risks, benefits and alternatives for the proposed anesthesia with the patient or authorized representative who has indicated his/her understanding and acceptance.     Dental advisory given  Plan Discussed with: CRNA  Anesthesia Plan Comments: (Plan Full PPE use  Plan GA with GETA as needed d/w pt -WTP with same after Q&A)        Anesthesia Quick Evaluation

## 2018-08-08 NOTE — H&P (Signed)
@LOGO @   Primary Care Physician:  Terald Sleeper, PA-C Primary Gastroenterologist:  Dr. Gala Romney  Pre-Procedure History & Physical: HPI:  Jennifer [name] is a 48 y.o. female here for further evaluation of refractory GERD and vague dysphagia to solids.  Also here for high rescreening colonoscopy history of colonic polyps in first-degree relative  Past Medical History:  Diagnosis Date  . Allergy   . Anemia   . Anxiety   . Arthritis    neck  . Asthma   . Diabetes mellitus without complication ([place])   . GERD (gastroesophageal reflux disease)   . History of flexible sigmoidoscopy 1999   Normal to 60cm,  . Hypertension   . Neuromuscular disorder ([place])   . Ovarian cancer ([place])    skin cancer  . Panic attack   . PTSD (post-traumatic stress disorder)   . S/P endoscopy Feb 2012   Nl, s/p 56-[facility] dilator, biopsy benign  . S/P endoscopy 2008   Mild gastritis  . Seizures ([place])   . Substance abuse ([place])    drug addiction    Past Surgical History:  Procedure Laterality Date  . ABDOMINAL HYSTERECTOMY     ovarian tumors  . CHOLECYSTECTOMY    . COLONOSCOPY  03/01/2011   [facility]du-External hemorrhoids/otherwise normal rectum and colon.  . ESOPHAGOGASTRODUODENOSCOPY  2008   gastritis  . ESOPHAGOGASTRODUODENOSCOPY  2012   Moderate sized hiatal hernia, non-H. pylori gastritis  . ESOPHAGOGASTRODUODENOSCOPY N/A 11/17/2013   RMR: Mild erosive reflux esophagitis. Patulous EG junction  . HERNIA REPAIR     Incisional with mesh  . PARTIAL HYSTERECTOMY    . SKIN CANCER EXCISION     left bicep  . TONSILLECTOMY      Prior to Admission medications   Medication Sig Start Date End Date Taking? Authorizing Provider  busPIRone (BUSPAR) 10 MG tablet Take 2 tablets (20 mg total) by mouth 3 (three) times daily. 08/06/18  Yes Norman Clay, MD  cetirizine (ZYRTEC) 10 MG tablet Take 1 tablet (10 mg total) by mouth every morning. 05/21/18  Yes Terald Sleeper, PA-C  DULoxetine (CYMBALTA) 60 MG  capsule Take 1 capsule (60 mg total) by mouth daily. 06/06/18  Yes Hisada, Elie Goody, MD  fluorometholone (FML) 0.1 % ophthalmic suspension Place 1 drop into both eyes 3 (three) times daily.   Yes [provider]  fluticasone (FLONASE) 50 MCG/ACT nasal spray Place 1 spray into both nostrils daily. 05/21/18  Yes Terald Sleeper, PA-C  lamoTRIgine (LAMICTAL) 150 MG tablet Take 1 tablet (150 mg total) by mouth daily. 06/06/18  Yes Norman Clay, MD  Levetiracetam 750 MG TB24 Take 3 tablets (2,250 mg total) by mouth at bedtime. 07/10/18  Yes Suzzanne Cloud, NP  meloxicam (MOBIC) 7.5 MG tablet TAKE (1) TABLET BY MOUTH ONCE DAILY. Patient taking differently: Take 7.5 mg by mouth daily.  05/21/18  Yes Terald Sleeper, PA-C  ondansetron (ZOFRAN) 4 MG tablet Take 1 tablet (4 mg total) by mouth every 8 (eight) hours as needed for nausea or vomiting. 04/10/18  Yes Annitta Needs, NP  oxybutynin (DITROPAN) 5 MG tablet Take 1 tablet (5 mg total) by mouth 2 (two) times daily. 05/21/18  Yes Terald Sleeper, PA-C  pantoprazole (PROTONIX) 40 MG tablet Take 40 mg by mouth 2 (two) times daily.   Yes [provider]  polyethylene glycol (MIRALAX / GLYCOLAX) packet Take 17 g by mouth daily as needed for moderate constipation.    Yes [provider]  polyethylene glycol-electrolytes (TRILYTE) 420 g solution Take 4,000 mLs by mouth as directed. 06/19/18  Yes Daneil Dolin, MD  traZODone (DESYREL) 100 MG tablet TAKE 1/2 TO 1 TABLET AT BEDTIME AS NEEDED FOR SLEEP. 08/02/18  Yes Terald Sleeper, PA-C  Vitamin D, Ergocalciferol, (DRISDOL) 1.25 MG (50000 UT) CAPS capsule Take 1 capsule (50,000 Units total) by mouth every 7 (seven) days. 05/21/18  Yes Terald Sleeper, PA-C  azithromycin (ZITHROMAX Z-PAK) 250 MG tablet Take as directed Patient not taking: Reported on 07/30/2018 07/12/18   Terald Sleeper, PA-C  linaclotide [facility]) 145 MCG CAPS capsule Take 1 capsule (145 mcg total) by mouth daily before breakfast. Patient  not taking: Reported on 07/30/2018 06/19/18   Annitta Needs, NP    Allergies as of 06/19/2018 - Review Complete 06/19/2018  Allergen Reaction Noted  . Metrizamide Rash and Swelling 10/17/2010  . Shellfish allergy Swelling 10/29/2013  . Codeine Other (See Comments)   . Hydrocodone Itching 12/03/2012  . Ivp dye [iodinated diagnostic agents] Rash 10/17/2010  . Latex Rash 03/08/2010    Family History  Problem Relation Age of Onset  . Crohn's disease Maternal Aunt   . Breast cancer Maternal Grandmother   . Cancer Maternal Grandmother   . Colon cancer Maternal Grandfather        81  . Pancreatic cancer Maternal Grandfather   . Cancer Maternal Grandfather   . Crohn's disease Cousin        x 2 cousins  . Breast cancer Daughter 55  . Arthritis Mother   . COPD Mother   . Depression Mother   . Drug abuse Mother   . Heart disease Mother   . Hypertension Mother   . Hyperlipidemia Mother   . Learning disabilities Mother   . Mental illness Mother   . Alcohol abuse Mother   . Colon polyps Mother 59       tubular adenoma  . Early death Father 66       murder  . Alcohol abuse Father   . Drug abuse Father   . Learning disabilities Father   . Cancer Paternal Grandmother   . Alcohol abuse Paternal Grandmother   . Cancer Paternal Grandfather   . Alcohol abuse Maternal Uncle   . Alcohol abuse Paternal Uncle   . Drug abuse Paternal Uncle     Social History   Socioeconomic History  . Marital status: Married    Spouse name: [name]  . Number of children: Not on file  . Years of education: Not on file  . Highest education level: 11th grade  Occupational History  . Occupation: disabled    Fish farm manager: NOT EMPLOYED  Social Needs  . Financial resource strain: Not on file  . Food insecurity    Worry: Not on file    Inability: Not on file  . Transportation needs    Medical: Not on file    Non-medical: Not on file  Tobacco Use  . Smoking status: Current Some Day Smoker    Packs/day:  1.50    Years: 26.00    Pack years: 39.00    Types: Cigarettes    Start date: 05/03/1984  . Smokeless tobacco: Never Used  Substance and Sexual Activity  . Alcohol use: Not Currently    Alcohol/week: 1.0 standard drinks    Types: 1 Cans of beer per week    Comment: occasionally  . Drug use: No  . Sexual activity: Not Currently    Birth control/protection: Surgical  Lifestyle  . Physical activity    Days per week: Not on file    Minutes per session: Not on file  . Stress: Not on file  Relationships  . Social Herbalist on phone: Not on file    Gets together: Not on file    Attends religious service: Not on file    Active member of club or organization: Not on file    Attends meetings of clubs or organizations: Not on file    Relationship status: Not on file  . Intimate partner violence    Fear of current or ex partner: Not on file    Emotionally abused: Not on file    Physically abused: Not on file    Forced sexual activity: Not on file  Other Topics Concern  . Not on file  Social History Narrative   Lives at home with her husband.   Right handed.    1-2 cups of coffee 3 times per wk.    Intermittent soda.    Review of Systems: See HPI, otherwise negative ROS  Physical Exam: There were no vitals taken for this visit. General:   Alert,  Well-developed, well-nourished, pleasant and cooperative in NAD Neck:  Supple; no masses or thyromegaly. No significant cervical adenopathy. Lungs:  Clear throughout to auscultation.   No wheezes, crackles, or rhonchi. No acute distress. Heart:  Regular rate and rhythm; no murmurs, clicks, rubs,  or gallops. Abdomen: Non-distended, normal bowel sounds.  Soft and nontender without appreciable mass or hepatosplenomegaly.  Pulses:  Normal pulses noted. Extremities:  Without clubbing or edema.  Impression/Plan: 48 year old lady here for further evaluation of GERD and vague esophageal dysphagia as well as positive family history  of colon polyps.  EGD with ED and colonoscopy today per plan.  The risks, benefits, limitations, alternatives and imponderables have been reviewed with the patient. Questions have been answered. All parties are agreeable.      Notice: This dictation was prepared with Dragon dictation along with smaller phrase technology. Any transcriptional errors that result from this process are unintentional and may not be corrected upon review.

## 2018-08-08 NOTE — Discharge Instructions (Signed)
°Colonoscopy °Discharge Instructions ° °Read the instructions outlined below and refer to this sheet in the next few weeks. These discharge instructions provide you with general information on caring for yourself after you leave the hospital. Your doctor may also give you specific instructions. While your treatment has been planned according to the most current medical practices available, unavoidable complications occasionally occur. If you have any problems or questions after discharge, call Dr. Rourk at [phone]. °ACTIVITY °· You may resume your regular activity, but move at a slower pace for the next 24 hours.  °· Take frequent rest periods for the next 24 hours.  °· Walking will help get rid of the air and reduce the bloated feeling in your belly (abdomen).  °· No driving for 24 hours (because of the medicine (anesthesia) used during the test).   °· Do not sign any important legal documents or operate any machinery for 24 hours (because of the anesthesia used during the test).  °NUTRITION °· Drink plenty of fluids.  °· You may resume your normal diet as instructed by your doctor.  °· Begin with a light meal and progress to your normal diet. Heavy or fried foods are harder to digest and may make you feel sick to your stomach (nauseated).  °· Avoid alcoholic beverages for 24 hours or as instructed.  °MEDICATIONS °· You may resume your normal medications unless your doctor tells you otherwise.  °WHAT YOU CAN EXPECT TODAY °· Some feelings of bloating in the abdomen.  °· Passage of more gas than usual.  °· Spotting of blood in your stool or on the toilet paper.  °IF YOU HAD POLYPS REMOVED DURING THE COLONOSCOPY: °· No aspirin products for 7 days or as instructed.  °· No alcohol for 7 days or as instructed.  °· Eat a soft diet for the next 24 hours.  °FINDING OUT THE RESULTS OF YOUR TEST °Not all test results are available during your visit. If your test results are not back during the visit, make an appointment  with your caregiver to find out the results. Do not assume everything is normal if you have not heard from your caregiver or the medical facility. It is important for you to follow up on all of your test results.  °SEEK IMMEDIATE MEDICAL ATTENTION IF: °· You have more than a spotting of blood in your stool.  °· Your belly is swollen (abdominal distention).  °· You are nauseated or vomiting.  °· You have a temperature over 101.  °· You have abdominal pain or discomfort that is severe or gets worse throughout the day.  °EGD °Discharge instructions °Please read the instructions outlined below and refer to this sheet in the next few weeks. These discharge instructions provide you with general information on caring for yourself after you leave the hospital. Your doctor may also give you specific instructions. While your treatment has been planned according to the most current medical practices available, unavoidable complications occasionally occur. If you have any problems or questions after discharge, please call your doctor. °ACTIVITY °· You may resume your regular activity but move at a slower pace for the next 24 hours.  °· Take frequent rest periods for the next 24 hours.  °· Walking will help expel (get rid of) the air and reduce the bloated feeling in your abdomen.  °· No driving for 24 hours (because of the anesthesia (medicine) used during the test).  °· You may shower.  °· Do not sign any important   legal documents or operate any machinery for 24 hours (because of the anesthesia used during the test).  NUTRITION  Drink plenty of fluids.   You may resume your normal diet.   Begin with a light meal and progress to your normal diet.   Avoid alcoholic beverages for 24 hours or as instructed by your caregiver.  MEDICATIONS  You may resume your normal medications unless your caregiver tells you otherwise.  WHAT YOU CAN EXPECT TODAY  You may experience abdominal discomfort such as a feeling of fullness  or gas pains.  FOLLOW-UP  Your doctor will discuss the results of your test with you.  SEEK IMMEDIATE MEDICAL ATTENTION IF ANY OF THE FOLLOWING OCCUR:  Excessive nausea (feeling sick to your stomach) and/or vomiting.   Severe abdominal pain and distention (swelling).   Trouble swallowing.   Temperature over 101 F (37.8 C).   Rectal bleeding or vomiting of blood.    Protonix 40 mg twice daily  GERD information provided  Office visit with [place] in 4 weeks  Repeat colonoscopy in 5 years  I discussed my findings and recommendations with Jennifer [name] at [phone]  May resume all other medications         Gastroesophageal Reflux Disease, Adult Gastroesophageal reflux (GER) happens when acid from the stomach flows up into the tube that connects the mouth and the stomach (esophagus). Normally, food travels down the esophagus and stays in the stomach to be digested. With GER, food and stomach acid sometimes move back up into the esophagus. You may have a disease called gastroesophageal reflux disease (GERD) if the reflux:  Happens often.  Causes frequent or very bad symptoms.  Causes problems such as damage to the esophagus. When this happens, the esophagus becomes sore and swollen (inflamed). Over time, GERD can make small holes (ulcers) in the lining of the esophagus. What are the causes? This condition is caused by a problem with the muscle between the esophagus and the stomach. When this muscle is weak or not normal, it does not close properly to keep food and acid from coming back up from the stomach. The muscle can be weak because of:  Tobacco use.  Pregnancy.  Having a certain type of hernia (hiatal hernia).  Alcohol use.  Certain foods and drinks, such as coffee, chocolate, onions, and peppermint. What increases the risk? You are more likely to develop this condition if you:  Are overweight.  Have a disease that affects your connective tissue.  Use NSAID  medicines. What are the signs or symptoms? Symptoms of this condition include:  Heartburn.  Difficult or painful swallowing.  The feeling of having a lump in the throat.  A bitter taste in the mouth.  Bad breath.  Having a lot of saliva.  Having an upset or bloated stomach.  Belching.  Chest pain. Different conditions can cause chest pain. Make sure you see your doctor if you have chest pain.  Shortness of breath or noisy breathing (wheezing).  Ongoing (chronic) cough or a cough at night.  Wearing away of the surface of teeth (tooth enamel).  Weight loss. How is this treated? Treatment will depend on how bad your symptoms are. Your doctor may suggest:  Changes to your diet.  Medicine.  Surgery. Follow these instructions at home: Eating and drinking   Follow a diet as told by your doctor. You may need to avoid foods and drinks such as: ? Coffee and tea (with or without caffeine). ? Drinks that contain  alcohol. ? Energy drinks and sports drinks. ? Bubbly (carbonated) drinks or sodas. ? Chocolate and cocoa. ? Peppermint and mint flavorings. ? Garlic and onions. ? Horseradish. ? Spicy and acidic foods. These include peppers, chili powder, curry powder, vinegar, hot sauces, and BBQ sauce. ? Citrus fruit juices and citrus fruits, such as oranges, lemons, and limes. ? Tomato-based foods. These include red sauce, chili, salsa, and pizza with red sauce. ? Fried and fatty foods. These include donuts, french fries, potato chips, and high-fat dressings. ? High-fat meats. These include hot dogs, rib eye steak, sausage, ham, and bacon. ? High-fat dairy items, such as whole milk, butter, and cream cheese.  Eat small meals often. Avoid eating large meals.  Avoid drinking large amounts of liquid with your meals.  Avoid eating meals during the 2-3 hours before bedtime.  Avoid lying down right after you eat.  Do not exercise right after you eat. Lifestyle   Do not  use any products that contain nicotine or tobacco. These include cigarettes, e-cigarettes, and chewing tobacco. If you need help quitting, ask your doctor.  Try to lower your stress. If you need help doing this, ask your doctor.  If you are overweight, lose an amount of weight that is healthy for you. Ask your doctor about a safe weight loss goal. General instructions  Pay attention to any changes in your symptoms.  Take over-the-counter and prescription medicines only as told by your doctor. Do not take aspirin, ibuprofen, or other NSAIDs unless your doctor says it is okay.  Wear loose clothes. Do not wear anything tight around your waist.  Raise (elevate) the head of your bed about 6 inches (15 cm).  Avoid bending over if this makes your symptoms worse.  Keep all follow-up visits as told by your doctor. This is important. Contact a doctor if:  You have new symptoms.  You lose weight and you do not know why.  You have trouble swallowing or it hurts to swallow.  You have wheezing or a cough that keeps happening.  Your symptoms do not get better with treatment.  You have a hoarse voice. Get help right away if:  You have pain in your arms, neck, jaw, teeth, or back.  You feel sweaty, dizzy, or light-headed.  You have chest pain or shortness of breath.  You throw up (vomit) and your throw-up looks like blood or coffee grounds.  You pass out (faint).  Your poop (stool) is bloody or black.  You cannot swallow, drink, or eat. Summary  If a person has gastroesophageal reflux disease (GERD), food and stomach acid move back up into the esophagus and cause symptoms or problems such as damage to the esophagus.  Treatment will depend on how bad your symptoms are.  Follow a diet as told by your doctor.  Take all medicines only as told by your doctor. This information is not intended to replace advice given to you by your health care provider. Make sure you discuss any  questions you have with your health care provider. Document Released: 07/12/2007 Document Revised: 08/01/2017 Document Reviewed: 08/01/2017 Elsevier Patient Education  2020 [place] [employer] These instructions provide you with information about caring for yourself after your procedure. Your health care provider may also give you more specific instructions. Your treatment has been planned according to current medical practices, but problems sometimes occur. Call your health care provider if you have any problems or questions  after your procedure. What can I expect after the procedure? After your procedure, you may:  Feel sleepy for several hours.  Feel clumsy and have poor balance for several hours.  Feel forgetful about what happened after the procedure.  Have poor judgment for several hours.  Feel nauseous or vomit.  Have a sore throat if you had a breathing tube during the procedure. Follow these instructions at home: For at least 24 hours after the procedure:      Have a responsible adult stay with you. It is important to have someone help care for you until you are awake and alert.  Rest as needed.  Do not: ? Participate in activities in which you could fall or become injured. ? Drive. ? Use heavy machinery. ? Drink alcohol. ? Take sleeping pills or medicines that cause drowsiness. ? Make important decisions or sign legal documents. ? Take care of children on your own. Eating and drinking  Follow the diet that is recommended by your health care provider.  If you vomit, drink water, juice, or soup when you can drink without vomiting.  Make sure you have little or no nausea before eating solid foods. General instructions  Take over-the-counter and prescription medicines only as told by your health care provider.  If you have sleep apnea, surgery and certain medicines can increase your risk for breathing problems. Follow  instructions from your health care provider about wearing your sleep device: ? Anytime you are sleeping, including during daytime naps. ? While taking prescription pain medicines, sleeping medicines, or medicines that make you drowsy.  If you smoke, do not smoke without supervision.  Keep all follow-up visits as told by your health care provider. This is important. Contact a health care provider if:  You keep feeling nauseous or you keep vomiting.  You feel light-headed.  You develop a rash.  You have a fever. Get help right away if:  You have trouble breathing. Summary  For several hours after your procedure, you may feel sleepy and have poor judgment.  Have a responsible adult stay with you for at least 24 hours or until you are awake and alert. This information is not intended to replace advice given to you by your health care provider. Make sure you discuss any questions you have with your health care provider. Document Released: 05/16/2015 Document Revised: 04/23/2017 Document Reviewed: 05/16/2015 Elsevier Patient Education  2020 [employer].

## 2018-08-12 ENCOUNTER — Telehealth (HOSPITAL_COMMUNITY): Payer: Self-pay | Admitting: Physician Assistant

## 2018-08-12 ENCOUNTER — Ambulatory Visit (HOSPITAL_COMMUNITY): Payer: PRIVATE HEALTH INSURANCE | Admitting: Physical Therapy

## 2018-08-12 NOTE — Telephone Encounter (Signed)
08/12/18  I spoke with patient to cx and was going to reschedule but she said to let her get back in touch with her dr

## 2018-08-15 ENCOUNTER — Encounter (HOSPITAL_COMMUNITY): Payer: Self-pay | Admitting: Internal Medicine

## 2018-08-21 ENCOUNTER — Ambulatory Visit: Payer: PRIVATE HEALTH INSURANCE | Admitting: Neurology

## 2018-08-21 ENCOUNTER — Other Ambulatory Visit (HOSPITAL_COMMUNITY): Payer: Self-pay | Admitting: Physician Assistant

## 2018-08-21 ENCOUNTER — Other Ambulatory Visit: Payer: Self-pay

## 2018-08-21 DIAGNOSIS — M25552 Pain in left hip: Secondary | ICD-10-CM

## 2018-08-21 DIAGNOSIS — G40909 Epilepsy, unspecified, not intractable, without status epilepticus: Secondary | ICD-10-CM | POA: Diagnosis not present
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
## 2018-08-27 ENCOUNTER — Encounter (HOSPITAL_COMMUNITY): Payer: Self-pay | Admitting: Psychiatry

## 2018-08-27 ENCOUNTER — Other Ambulatory Visit: Payer: Self-pay

## 2018-08-27 ENCOUNTER — Ambulatory Visit (INDEPENDENT_AMBULATORY_CARE_PROVIDER_SITE_OTHER): Payer: PRIVATE HEALTH INSURANCE | Admitting: Psychiatry

## 2018-08-27 DIAGNOSIS — F4310 Post-traumatic stress disorder, unspecified: Secondary | ICD-10-CM

## 2018-08-27 DIAGNOSIS — F0630 Mood disorder due to known physiological condition, unspecified: Secondary | ICD-10-CM

## 2018-08-27 MED ORDER — LAMOTRIGINE 150 MG PO TABS: 150.0000 mg | ORAL_TABLET | Freq: Every day | ORAL | 0 refills | Status: DC

## 2018-08-27 MED ORDER — DULOXETINE HCL 60 MG PO CPEP: 60.0000 mg | ORAL_CAPSULE | Freq: Every day | ORAL | 0 refills | Status: DC

## 2018-08-27 NOTE — Patient Instructions (Signed)
1. Continue duloxetine 60 mg daily  2. ContinueBuspar20 mg three times a day 3.Continuelamotrigine 150 mg daily  4.Continuetrazodone 100mg  at night as needed for sleep 5.Next appointment: 9/22 at 1 PM

## 2018-08-27 NOTE — Procedures (Signed)
   HISTORY: 48 year old female, presented with memory loss, seizure  TECHNIQUE:  This is a routine 16 channel EEG recording with one channel devoted to a limited EKG recording.  It was performed during wakefulness, drowsiness and asleep.  Hyperventilation and photic stimulation were performed as activating procedures.  There are minimum muscle and movement artifact noted.  Upon maximum arousal, posterior dominant waking rhythm consistent of dysrhythmic theta range activity, with frequency of 4 to 6 Hz. Activities are symmetric over the bilateral posterior derivations and attenuated with eye opening.  Hyperventilation produced mild/moderate buildup with higher amplitude and the slower activities noted.  Photic stimulation did not alter the tracing.  During EEG recording, patient developed drowsiness and there was vertex waves noted, no deeper stage of sleep was achieved.  During EEG recording, there was no epileptiform discharge noted.  EKG demonstrate sinus rhythm, with heart rate of 66 bpm.  CONCLUSION: This is abnormal EEG.  There is evidence of generalized slowing of background activity, indicating bilateral hemisphere malfunction, common etiology are metabolic toxic.  Marcial Pacas, M.D. Ph.D.  [facility] [place], [place] [address] Phone: [phone] Fax:      [phone]

## 2018-09-02 ENCOUNTER — Telehealth: Payer: Self-pay | Admitting: *Deleted

## 2018-09-02 NOTE — Telephone Encounter (Signed)
I spoke to pt and then husband.  I relayed that EEG results did not show any seizure activity.  Did show some slowing (of brain waves) most likely related to medication.  No change in current medications at this time. He and pt did not have any questions.

## 2018-09-02 NOTE — Telephone Encounter (Signed)
-----   Message from Suzzanne Cloud, NP sent at 09/02/2018  3:36 PM EDT ----- Please call the patient for EEG report. EEG was mildly abnormal, evidence of some slowing, but no seizure activity seen. Sometimes the slowing may be side effect of medication. There is no need for medication change at this time.

## 2018-09-05 ENCOUNTER — Encounter: Payer: Self-pay | Admitting: Gastroenterology

## 2018-09-05 ENCOUNTER — Encounter: Payer: Self-pay | Admitting: Internal Medicine

## 2018-09-05 ENCOUNTER — Ambulatory Visit (INDEPENDENT_AMBULATORY_CARE_PROVIDER_SITE_OTHER): Payer: PRIVATE HEALTH INSURANCE | Admitting: Gastroenterology

## 2018-09-05 ENCOUNTER — Encounter: Payer: Self-pay | Admitting: *Deleted

## 2018-09-05 ENCOUNTER — Other Ambulatory Visit: Payer: Self-pay

## 2018-09-05 ENCOUNTER — Telehealth: Payer: Self-pay | Admitting: *Deleted

## 2018-09-05 VITALS — BP 111/75 | HR 74 | Temp 97.1°F | Ht 66.0 in | Wt 228.4 lb

## 2018-09-05 DIAGNOSIS — K2100 Gastro-esophageal reflux disease with esophagitis, without bleeding: Secondary | ICD-10-CM

## 2018-09-05 DIAGNOSIS — R6881 Early satiety: Secondary | ICD-10-CM | POA: Diagnosis not present

## 2018-09-05 DIAGNOSIS — K5900 Constipation, unspecified: Secondary | ICD-10-CM | POA: Diagnosis not present

## 2018-09-05 DIAGNOSIS — R1310 Dysphagia, unspecified: Secondary | ICD-10-CM

## 2018-09-05 DIAGNOSIS — R1110 Vomiting, unspecified: Secondary | ICD-10-CM | POA: Diagnosis not present

## 2018-09-05 DIAGNOSIS — M542 Cervicalgia: Secondary | ICD-10-CM

## 2018-09-05 NOTE — Patient Instructions (Addendum)
I am placing an order for you to have a gastric emptying study.  You should get a call about the time and date of this study.   Be sure you are eating small portions and chewing your foods really well.   Continue Protonix 40 mg twice daily.   Please try to avoid taking meloxicam as this can cause irritation in your stomach. Try tylenol instead.   Continue Linzess 145 mcg daily  We will see you back in 4-6 weeks.   Aliene Altes, PA-C [facility]

## 2018-09-05 NOTE — Telephone Encounter (Signed)
Called [facility] [place]. Spoke with Herbie Baltimore. Called ref# [id number]. No PA is required

## 2018-09-05 NOTE — Progress Notes (Signed)
Primary Care Physician:  Terald Sleeper, PA-C Primary GI Physician: Dr. Gala Romney  Chief Complaint  Patient presents with  . Follow-up    still has vomiting when she eats solid foods but it doesn't feel like it gets stuck    HPI:   Jennifer [name] is a 48 y.o. female with a GI history significant for GERD, constipation, s/p hemorrhoid banding x 3 in 2019. Last seen on 06/19/18 with chronic GERD, constipation, LUQ abdominal pain, intermittent N/V, early satiety and globus sensation. Was suspected N/V was related to uncontrolled GERD. Pepcid was added in the evenings to her regimen of Protonix 40 mg BID. Reglan was discontinued. Zofran as needed for nausea. Patient advised to restart Linzess 145 mcg. Plans to pursue EGD for uncontrolled upper GI symptoms and TCS due to family history of colon polyps.   EGD 08/08/18: Erosive reflux esophagitis. Dilated. Small hiatal hernia. Otherwise normal. Recommended continuing Protonix 40 mg BID Colonoscopy 08/08/18: The entire examined colon is normal. The distal rectum and anal verge are normal on retroflexion view. Repeat in 5 years.   Today she sates since EGD she can't eat heavy foods, like hamburger, pork chop, and chicken, or they will come back up within about 15 minutes after eating. No hematemesis or coffee ground emesis. She is eating small portions and remaining upright for at least 3 hours after eating. Foods are not getting stuck, they just come back up. No nausea prior to food coming back up. Able to keep down mashed potatoes, broth, and jello. Gets full quickly. States this is happening a lot more now. Aside from food regurgitation, she does have occasional nausea that comes and goes. Not specifically associated with meals. No vomiting with this. No abdominal pain. Will have a little epigastric discomfort occasionally after eating. Thinks this is related to her hiatal hernia. No breakthrough acid reflux or heartburn symptoms. Admits to some weight loss,  but not sure how much. BMs every other day. Much improved since adding Linzess and benefiber. No constipation no straining. No diarrhea.  Also stopped cheese and milk which improved gas and abdominal pain and helped her move her bowels well. Denies hematochezia and melena.   Complaining of tightness in her neck. States she can't turn her neck without sharp pain on the side, especially to the right. Also reports "knot" in her right trapezius.  Was present prior to EGD, but has been worsening. She has history of this and used to receive trigger point injections from her neurologist. Recently changed neurologists and she states they want her to go to therapy rather than injections. Also states she has occasional sharp pain when swallowing. Not regularly. No known triggers.  Uses throat lozenges. Sore throat was present prior to EGD and has improved a lot. Admits to having infection in her throat prior to her EGD with swelling in her throat and swollen uvula that required antibiotics. Sore throat has improved a lot, but she continues to have drainage from one side of her nose.    Denies fever, chills, fatigue, lightheadedness, dizziness, pre-syncope, syncope, chest pain, palpitations, shortness of breath, and cough.    Past Medical History:  Diagnosis Date  . Allergy   . Anemia   . Anxiety   . Arthritis    neck  . Asthma   . Diabetes mellitus without complication ([place])   . GERD (gastroesophageal reflux disease)   . History of flexible sigmoidoscopy 1999   Normal to 60cm,  . Hypertension   .  Neuromuscular disorder ([place])   . Ovarian cancer ([place])    skin cancer  . Panic attack   . PTSD (post-traumatic stress disorder)   . S/P endoscopy Feb 2012   Nl, s/p [facility] dilator, biopsy benign  . S/P endoscopy 2008   Mild gastritis  . Seizures ([place])   . Substance abuse ([place])    drug addiction    Past Surgical History:  Procedure Laterality Date  . ABDOMINAL HYSTERECTOMY     ovarian  tumors  . CHOLECYSTECTOMY    . COLONOSCOPY  03/01/2011   [place]-External hemorrhoids/otherwise normal rectum and colon.  . COLONOSCOPY WITH PROPOFOL N/A 08/08/2018   entire examined colon is normal. The distal rectum and anal verge are normal on retroflexion view. Repeat in 5 years.   . ESOPHAGOGASTRODUODENOSCOPY  2008   gastritis  . ESOPHAGOGASTRODUODENOSCOPY  2012   Moderate sized hiatal hernia, non-H. pylori gastritis  . ESOPHAGOGASTRODUODENOSCOPY N/A 11/17/2013   RMR: Mild erosive reflux esophagitis. Patulous EG junction  . ESOPHAGOGASTRODUODENOSCOPY (EGD) WITH PROPOFOL N/A 08/08/2018   Erosive reflux esophagitis. Dilated. Small hiatal hernia. Otherwise normal.   . HERNIA REPAIR     Incisional with mesh  . MALONEY DILATION  08/08/2018   Procedure: MALONEY DILATION;  Surgeon: Daneil Dolin, MD;  Location: [facility];  Service: Endoscopy;;  . PARTIAL HYSTERECTOMY    . SKIN CANCER EXCISION     left bicep  . TONSILLECTOMY      Current Outpatient Medications  Medication Sig Dispense Refill  . busPIRone (BUSPAR) 10 MG tablet Take 2 tablets (20 mg total) by mouth 3 (three) times daily. 180 tablet 0  . cetirizine (ZYRTEC) 10 MG tablet Take 1 tablet (10 mg total) by mouth every morning. 90 tablet 3  . DULoxetine (CYMBALTA) 60 MG capsule Take 1 capsule (60 mg total) by mouth daily. 90 capsule 0  . fluorometholone (FML) 0.1 % ophthalmic suspension Place 1 drop into both eyes 3 (three) times daily.    . fluticasone (FLONASE) 50 MCG/ACT nasal spray Place 1 spray into both nostrils daily. 16 g 11  . lamoTRIgine (LAMICTAL) 150 MG tablet Take 1 tablet (150 mg total) by mouth daily. 90 tablet 0  . Levetiracetam 750 MG TB24 Take 3 tablets (2,250 mg total) by mouth at bedtime. 270 tablet 4  . linaclotide (LINZESS) 145 MCG CAPS capsule Take 1 capsule (145 mcg total) by mouth daily before breakfast. 90 capsule 3  . meloxicam (MOBIC) 7.5 MG tablet TAKE (1) TABLET BY MOUTH ONCE DAILY. 30 tablet 1  .  ondansetron (ZOFRAN) 4 MG tablet Take 1 tablet (4 mg total) by mouth every 8 (eight) hours as needed for nausea or vomiting. 60 tablet 1  . oxybutynin (DITROPAN) 5 MG tablet Take 1 tablet (5 mg total) by mouth 2 (two) times daily. 60 tablet 11  . pantoprazole (PROTONIX) 40 MG tablet Take 40 mg by mouth 2 (two) times daily.    . traZODone (DESYREL) 100 MG tablet TAKE 1/2 TO 1 TABLET AT BEDTIME AS NEEDED FOR SLEEP. 28 tablet 11  . Vitamin D, Ergocalciferol, (DRISDOL) 1.25 MG (50000 UT) CAPS capsule Take 1 capsule (50,000 Units total) by mouth every 7 (seven) days. 5 capsule 11   No current facility-administered medications for this visit.     Allergies as of 09/05/2018 - Review Complete 09/05/2018  Allergen Reaction Noted  . Metrizamide Rash and Swelling 10/17/2010  . Shellfish allergy Swelling 10/29/2013  . Amoxapine and related  07/12/2018  .  Codeine Other (See Comments)   . Hydrocodone Itching 12/03/2012  . Ivp dye [iodinated diagnostic agents] Rash 10/17/2010  . Latex Rash 03/08/2010    Family History  Problem Relation Age of Onset  . Crohn's disease Maternal Aunt   . Breast cancer Maternal Grandmother   . Cancer Maternal Grandmother   . Colon cancer Maternal Grandfather        32  . Pancreatic cancer Maternal Grandfather   . Cancer Maternal Grandfather   . Crohn's disease Cousin        x 2 cousins  . Breast cancer Daughter 56  . Arthritis Mother   . COPD Mother   . Depression Mother   . Drug abuse Mother   . Heart disease Mother   . Hypertension Mother   . Hyperlipidemia Mother   . Learning disabilities Mother   . Mental illness Mother   . Alcohol abuse Mother   . Colon polyps Mother 30       tubular adenoma  . Early death Father 64       murder  . Alcohol abuse Father   . Drug abuse Father   . Learning disabilities Father   . Cancer Paternal Grandmother   . Alcohol abuse Paternal Grandmother   . Cancer Paternal Grandfather   . Alcohol abuse Maternal Uncle   .  Alcohol abuse Paternal Uncle   . Drug abuse Paternal Uncle     Social History   Socioeconomic History  . Marital status: Married    Spouse name: [name]  . Number of children: Not on file  . Years of education: Not on file  . Highest education level: 11th grade  Occupational History  . Occupation: disabled    Fish farm manager: NOT EMPLOYED  Social Needs  . Financial resource strain: Not on file  . Food insecurity    Worry: Not on file    Inability: Not on file  . Transportation needs    Medical: Not on file    Non-medical: Not on file  Tobacco Use  . Smoking status: Current Some Day Smoker    Packs/day: 1.50    Years: 26.00    Pack years: 39.00    Types: Cigarettes    Start date: 05/03/1984  . Smokeless tobacco: Never Used  . Tobacco comment: intermittently smoking some  Substance and Sexual Activity  . Alcohol use: Not Currently    Alcohol/week: 1.0 standard drinks    Types: 1 Cans of beer per week    Comment: occasionally  . Drug use: No  . Sexual activity: Not Currently    Birth control/protection: Surgical  Lifestyle  . Physical activity    Days per week: Not on file    Minutes per session: Not on file  . Stress: Not on file  Relationships  . Social Herbalist on phone: Not on file    Gets together: Not on file    Attends religious service: Not on file    Active member of club or organization: Not on file    Attends meetings of clubs or organizations: Not on file    Relationship status: Not on file  Other Topics Concern  . Not on file  Social History Narrative   Lives at home with her husband.   Right handed.    1-2 cups of coffee 3 times per wk.    Intermittent soda.    Review of Systems: Gen: See HPI CV: See HPI Resp: See HPI GI: See  HPI Derm: Denies rash, itching, dry skin  Heme: Denies other bruising, bleeding, and enlarged lymph nodes.  Physical Exam: BP 111/75   Pulse 74   Temp (!) 97.1 F (36.2 C) (Temporal)   Ht 5\' 6"  (1.676 m)    Wt 228 lb 6.4 oz (103.6 kg)   BMI 36.86 kg/m  General:   Alert and oriented. No distress noted. Pleasant and cooperative.  Head:  Normocephalic and atraumatic. Eyes:  Conjuctiva clear without scleral icterus. Mouth: Throat without erythema. Difficult to see throat well due to tongue in the way and strong gag reflex. Oral mucosa pink and moist. No lesions. Neck: Without cervical, tonsillar, submental, or submandibular adenopathy. Some tightness and tenderness to palpation along SCM and trapezius, greater on the right.  Heart:  S1, S2 present without murmurs appreciated. Lungs:  Clear to auscultation bilaterally. No wheezes, rales, or rhonchi. No distress.  Abdomen:  +BS, soft, non-tender and non-distended. No rebound or guarding. No HSM or masses noted. Msk:  Symmetrical without gross deformities. Normal posture. Extremities:  Without edema. Neurologic:  Alert and  oriented x4 Psych:  Normal mood and affect.

## 2018-09-08 ENCOUNTER — Encounter: Payer: Self-pay | Admitting: Gastroenterology

## 2018-09-08 DIAGNOSIS — R1110 Vomiting, unspecified: Secondary | ICD-10-CM | POA: Insufficient documentation

## 2018-09-08 DIAGNOSIS — R1310 Dysphagia, unspecified: Secondary | ICD-10-CM | POA: Insufficient documentation

## 2018-09-08 DIAGNOSIS — R6881 Early satiety: Secondary | ICD-10-CM | POA: Insufficient documentation

## 2018-09-08 NOTE — Assessment & Plan Note (Addendum)
Constipation well controlled on Linzess 145 mcg daily and benefiber daily. Also stopped eating cheese and mild which has helped regulate her bowels and improved gas and abdominal pain. No hematochezia or melena. Recent colonoscopy on 08/08/18 with normal colon. Repeat in 2025.  Continue Linzess 145 mcg and benefiber daily.

## 2018-09-08 NOTE — Assessment & Plan Note (Addendum)
Patient complaining of neck pain/stiffness with inability to turn her head completely to the right without sharp pain along the SCM. Also reporting "knot" in her right trapezius. She has a history of neck stiffness and was receiving trigger point injections into her trapezius by her prior neurologists. Patient with some tightness and tenderness in trapezius and SCM on exam today. No lymphadenopathy appreciated. Suspect this is recurrence of her past symptoms as she is no longer receiving trigger point injections due to recently changing neurologists who want her to pursue therapy rather than injections at this time. Doubt complication from recent EGD as she is now 4 weeks out.  Recommend she follow-up with her neurologists.

## 2018-09-08 NOTE — Assessment & Plan Note (Addendum)
Heartburn and acid reflux well controlled on Protonix 40 mg twice daily. Recent EGD on 08/08/18 with erosive reflux esophagitis. S/p dilation due to dysphagia. Small hiatal hernia noted. She continues to have intermittent nausea that comes and goes quickly. No vomiting with this. However, since last visit and EGD, patient has had worsening early satiety and is regurgitating her food within 15 minutes if she eats hamburger, pork chops, or chicken. No hematemesis or coffee ground emesis. Able to keep soft foods down. No dysphagia or feeling like foods get hung in esophagus. Lost about 6 lbs since March. Mobic on patients medication list. Patient gets pill pack, thinks she is taking this. Of note, Reglan was discontinued at last visit. Suspect underlying gastroparesis as symptoms of early satiety have worsened and now with food regurgitation after discontinuing Reglan. Hiatal hernia likely influencing symptoms. Query influence of possible weakening of LES s/p recent dilation. Doubt anatomic abnormality as EGD was less than one month ago. No complications reported during procedure.   Proceed with Gastric Emptying Study Continue Protonix 40 mg BID Eat small portions and chew foods well Try decreasing use of Mobic.  Follow-up in 4-6 weeks.

## 2018-09-08 NOTE — Assessment & Plan Note (Signed)
Addressed under GERD 

## 2018-09-08 NOTE — Assessment & Plan Note (Addendum)
Occasional sharp pain when swallowing. Present prior to EGD and has improved. Patient reports having throat infection prior to EGD with swelling in there throat. Completed a course of antibiotics. Continues to have drainage from one side of her nose. Suspect possible post nasal drip. Less likely ongoing infection as there is no erythema of the throat or lymphadenopathy on exam. No fever or chills. Do no suspect complication from EGD as this was present prior to EGD and has been improving.    Recommended drinking plenty of water.   Follow-up with primary care Will see how she is doing in 4-6 weeks at follow-up

## 2018-09-09 ENCOUNTER — Encounter (HOSPITAL_COMMUNITY): Payer: Self-pay

## 2018-09-09 ENCOUNTER — Encounter (HOSPITAL_COMMUNITY): Payer: Self-pay | Admitting: Emergency Medicine

## 2018-09-09 ENCOUNTER — Other Ambulatory Visit: Payer: Self-pay

## 2018-09-09 ENCOUNTER — Emergency Department (HOSPITAL_COMMUNITY)
Admission: EM | Admit: 2018-09-09 | Discharge: 2018-09-09 | Disposition: A | Discharge status: Home / Self Care | Payer: PRIVATE HEALTH INSURANCE | Attending: Emergency Medicine | Admitting: Emergency Medicine

## 2018-09-09 ENCOUNTER — Other Ambulatory Visit (HOSPITAL_COMMUNITY): Payer: Self-pay | Admitting: Psychiatry

## 2018-09-09 ENCOUNTER — Emergency Department (HOSPITAL_COMMUNITY): Payer: PRIVATE HEALTH INSURANCE

## 2018-09-09 ENCOUNTER — Ambulatory Visit (HOSPITAL_COMMUNITY)
Admission: RE | Admit: 2018-09-09 | Discharge: 2018-09-09 | Disposition: A | Discharge status: Home / Self Care | Payer: PRIVATE HEALTH INSURANCE | Source: Ambulatory Visit | Attending: Gastroenterology | Admitting: Gastroenterology

## 2018-09-09 DIAGNOSIS — R1110 Vomiting, unspecified: Secondary | ICD-10-CM | POA: Diagnosis not present

## 2018-09-09 DIAGNOSIS — Z5321 Procedure and treatment not carried out due to patient leaving prior to being seen by health care provider: Secondary | ICD-10-CM | POA: Insufficient documentation

## 2018-09-09 DIAGNOSIS — R6881 Early satiety: Secondary | ICD-10-CM | POA: Diagnosis not present

## 2018-09-09 DIAGNOSIS — M542 Cervicalgia: Secondary | ICD-10-CM | POA: Insufficient documentation

## 2018-09-09 MED ORDER — BUSPIRONE HCL 10 MG PO TABS: 20.0000 mg | ORAL_TABLET | Freq: Three times a day (TID) | ORAL | 1 refills | Status: DC

## 2018-09-09 MED ORDER — TECHNETIUM TC 99M SULFUR COLLOID
2.0000 | Freq: Once | INTRAVENOUS | Status: AC | PRN
  Administered 2018-09-09: 2.1 via ORAL

## 2018-09-09 NOTE — ED Notes (Signed)
Pt told registration at 1645 that she was leaving due to wait time.

## 2018-09-09 NOTE — ED Triage Notes (Signed)
Pt states she is receiving treatment for "trigger points" in her neck since June. Pt states pain began radiating from RT shoulder into her neck. Pt reports difficulty lifting RT arm.

## 2018-09-10 ENCOUNTER — Telehealth: Payer: Self-pay | Admitting: Gastroenterology

## 2018-09-10 NOTE — Progress Notes (Signed)
CC'ED TO PCP 

## 2018-09-10 NOTE — Telephone Encounter (Signed)
Please let patient know gastric emptying study normal. Would like to pursue UGI series at this time for further evaluation of food regurgitation and intermittent nausea.    [facility] Clinical Pool: Can we get patient scheduled for UGI series?

## 2018-09-10 NOTE — Progress Notes (Signed)
Gastric emptying study normal. Would like to pursue UGI series at this time for further evaluation of food regurgitation and intermittent nausea.

## 2018-09-11 ENCOUNTER — Telehealth: Payer: Self-pay | Admitting: Physician Assistant

## 2018-09-11 NOTE — Telephone Encounter (Signed)
Called patient. She is aware of results. She does not want to pursue UGI series at this time. She states she has something going on with her neck right now. She is waiting for spouse to get home so she can go see PCP. She will call back if she decides she wants to have the UGI series done. FYI to [facility]

## 2018-09-11 NOTE — Telephone Encounter (Signed)
Noted  

## 2018-09-11 NOTE — Telephone Encounter (Signed)
Pt was not admitted to hosp, went to [facility] ED, was released with shoulder pain. She will bring paperwork with her to appt

## 2018-09-12 ENCOUNTER — Telehealth: Payer: Self-pay | Admitting: Physician Assistant

## 2018-09-25 ENCOUNTER — Ambulatory Visit: Payer: PRIVATE HEALTH INSURANCE | Admitting: Gastroenterology

## 2018-09-30 ENCOUNTER — Ambulatory Visit: Payer: PRIVATE HEALTH INSURANCE | Admitting: Physician Assistant

## 2018-10-09 NOTE — Progress Notes (Deleted)
Referring Provider: Terald Sleeper, PA-C Primary Care Physician:  Terald Sleeper, PA-C Primary GI Physician: Dr. Gala Romney  No chief complaint on file.   HPI:   Jennifer [name] is a 48 y.o. female with history of GERD, constipation, and hemorrhoid banding x3 in 2019.  Colonoscopy up-to-date on 08/08/2018 and due for repeat in 2025.  She presents today for follow-up of GERD and food regurgitation***.    Patient was last seen in office on 09/05/18. She had recently undergone EGD on 08/08/18 for further evaluation of upper GI symptoms including uncontrolled GERD, left upper quadrant abdominal pain, nausea/vomiting, early satiety.  EGD on 08/08/2018 with erosive reflux esophagitis status post dilation.  Small hiatal hernia.  Otherwise normal. When I saw her in follow-up on 7/30 she was having worsening early satiety and new onset of solid food regurgitation after meals not preceded by nausea.  She did continue with other intermittent short-lived nausea without vomiting. GERD symptoms were well controlled. Occasional minimal postprandial epigastric discomfort but abdominal pain had improved. Suspected worsening early satiety and food regurgitation was related to underlying gastroparesis as Reglan had been discontinued in May 2020.  Plans for gastric emptying study.     Gastric emptying study on 09/09/2018 normal.  Recommended upper GI series for further evaluation.  Patient did not want to pursue upper GI series at that time as she was having trouble with her neck.  Today she states   GERD:  Constipation:   Past Medical History:  Diagnosis Date  . Allergy   . Anemia   . Anxiety   . Arthritis    neck  . Asthma   . Diabetes mellitus without complication ([place])   . GERD (gastroesophageal reflux disease)   . History of flexible sigmoidoscopy 1999   Normal to 60cm,  . Hypertension   . Neuromuscular disorder ([place])   . Ovarian cancer ([place])    skin cancer  . Panic attack   . PTSD (post-traumatic  stress disorder)   . S/P endoscopy Feb 2012   Nl, s/p 56-French [facility] dilator, biopsy benign  . S/P endoscopy 2008   Mild gastritis  . Seizures ([place])   . Substance abuse ([place])    drug addiction    Past Surgical History:  Procedure Laterality Date  . ABDOMINAL HYSTERECTOMY     ovarian tumors  . CHOLECYSTECTOMY    . COLONOSCOPY  03/01/2011   Rourk-External hemorrhoids/otherwise normal rectum and colon.  . COLONOSCOPY WITH PROPOFOL N/A 08/08/2018   entire examined colon is normal. The distal rectum and anal verge are normal on retroflexion view. Repeat in 5 years.   . ESOPHAGOGASTRODUODENOSCOPY  2008   gastritis  . ESOPHAGOGASTRODUODENOSCOPY  2012   Moderate sized hiatal hernia, non-H. pylori gastritis  . ESOPHAGOGASTRODUODENOSCOPY N/A 11/17/2013   RMR: Mild erosive reflux esophagitis. Patulous EG junction  . ESOPHAGOGASTRODUODENOSCOPY (EGD) WITH PROPOFOL N/A 08/08/2018   Erosive reflux esophagitis. Dilated. Small hiatal hernia. Otherwise normal.   . HERNIA REPAIR     Incisional with mesh  . MALONEY DILATION  08/08/2018   Procedure: MALONEY DILATION;  Surgeon: Daneil Dolin, MD;  Location: [facility] ENDO SU[facility];  Service: Endoscopy;;  . PARTIAL HYSTERECTOMY    . SKIN CANCER EXCISION     left bicep  . TONSILLECTOMY      Current Outpatient Medications  Medication Sig Dispense Refill  . busPIRone (BUSPAR) 10 MG tablet Take 2 tablets (20 mg total) by mouth 3 (three) times daily. 168 tablet 1  .  cetirizine (ZYRTEC) 10 MG tablet Take 1 tablet (10 mg total) by mouth every morning. 90 tablet 3  . DULoxetine (CYMBALTA) 60 MG capsule Take 1 capsule (60 mg total) by mouth daily. 90 capsule 0  . fluorometholone (FML) 0.1 % ophthalmic suspension Place 1 drop into both eyes 3 (three) times daily.    . fluticasone (FLONASE) 50 MCG/ACT nasal spray Place 1 spray into both nostrils daily. 16 g 11  . lamoTRIgine (LAMICTAL) 150 MG tablet Take 1 tablet (150 mg total) by mouth daily. 90 tablet 0  .  Levetiracetam 750 MG TB24 Take 3 tablets (2,250 mg total) by mouth at bedtime. 270 tablet 4  . linaclotide (LINZESS) 145 MCG CAPS capsule Take 1 capsule (145 mcg total) by mouth daily before breakfast. 90 capsule 3  . meloxicam (MOBIC) 7.5 MG tablet TAKE (1) TABLET BY MOUTH ONCE DAILY. 30 tablet 1  . ondansetron (ZOFRAN) 4 MG tablet Take 1 tablet (4 mg total) by mouth every 8 (eight) hours as needed for nausea or vomiting. 60 tablet 1  . oxybutynin (DITROPAN) 5 MG tablet Take 1 tablet (5 mg total) by mouth 2 (two) times daily. 60 tablet 11  . pantoprazole (PROTONIX) 40 MG tablet Take 40 mg by mouth 2 (two) times daily.    . traZODone (DESYREL) 100 MG tablet TAKE 1/2 TO 1 TABLET AT BEDTIME AS NEEDED FOR SLEEP. 28 tablet 11  . Vitamin D, Ergocalciferol, (DRISDOL) 1.25 MG (50000 UT) CAPS capsule Take 1 capsule (50,000 Units total) by mouth every 7 (seven) days. 5 capsule 11   No current facility-administered medications for this visit.     Allergies as of 10/10/2018 - Review Complete 09/09/2018  Allergen Reaction Noted  . Metrizamide Rash and Swelling 10/17/2010  . Shellfish allergy Swelling 10/29/2013  . Amoxapine and related  07/12/2018  . Codeine Other (See Comments)   . Hydrocodone Itching 12/03/2012  . Ivp dye [iodinated diagnostic agents] Rash 10/17/2010  . Latex Rash 03/08/2010    Family History  Problem Relation Age of Onset  . Crohn's disease Maternal Aunt   . Breast cancer Maternal Grandmother   . Cancer Maternal Grandmother   . Colon cancer Maternal Grandfather        21  . Pancreatic cancer Maternal Grandfather   . Cancer Maternal Grandfather   . Crohn's disease Cousin        x 2 cousins  . Breast cancer Daughter 45  . Arthritis Mother   . COPD Mother   . Depression Mother   . Drug abuse Mother   . Heart disease Mother   . Hypertension Mother   . Hyperlipidemia Mother   . Learning disabilities Mother   . Mental illness Mother   . Alcohol abuse Mother   . Colon  polyps Mother 24       tubular adenoma  . Early death Father 74       murder  . Alcohol abuse Father   . Drug abuse Father   . Learning disabilities Father   . Cancer Paternal Grandmother   . Alcohol abuse Paternal Grandmother   . Cancer Paternal Grandfather   . Alcohol abuse Maternal Uncle   . Alcohol abuse Paternal Uncle   . Drug abuse Paternal Uncle     Social History   Socioeconomic History  . Marital status: Married    Spouse name: [name]  . Number of children: Not on file  . Years of education: Not on file  . Highest education  level: 11th grade  Occupational History  . Occupation: disabled    Fish farm manager: NOT EMPLOYED  Social Needs  . Financial resource strain: Not on file  . Food insecurity    Worry: Not on file    Inability: Not on file  . Transportation needs    Medical: Not on file    Non-medical: Not on file  Tobacco Use  . Smoking status: Current Some Day Smoker    Packs/day: 1.50    Years: 26.00    Pack years: 39.00    Types: Cigarettes    Start date: 05/03/1984  . Smokeless tobacco: Never Used  . Tobacco comment: intermittently smoking some  Substance and Sexual Activity  . Alcohol use: Not Currently    Alcohol/week: 1.0 standard drinks    Types: 1 Cans of beer per week    Comment: occasionally  . Drug use: No  . Sexual activity: Not Currently    Birth control/protection: Surgical  Lifestyle  . Physical activity    Days per week: Not on file    Minutes per session: Not on file  . Stress: Not on file  Relationships  . Social Herbalist on phone: Not on file    Gets together: Not on file    Attends religious service: Not on file    Active member of club or organization: Not on file    Attends meetings of clubs or organizations: Not on file    Relationship status: Not on file  Other Topics Concern  . Not on file  Social History Narrative   Lives at home with her husband.   Right handed.    1-2 cups of coffee 3 times per wk.     Intermittent soda.    Review of Systems: Gen: Denies fever, chills, anorexia. Denies fatigue, weakness, weight loss.  CV: Denies chest pain, palpitations, syncope, peripheral edema, and claudication. Resp: Denies dyspnea at rest, cough, wheezing, coughing up blood, and pleurisy. GI: Denies vomiting blood, jaundice, and fecal incontinence.   Denies dysphagia or odynophagia. Derm: Denies rash, itching, dry skin Psych: Denies depression, anxiety, memory loss, confusion. No homicidal or suicidal ideation.  Heme: Denies bruising, bleeding, and enlarged lymph nodes.  Physical Exam: There were no vitals taken for this visit. General:   Alert and oriented. No distress noted. Pleasant and cooperative.  Head:  Normocephalic and atraumatic. Eyes:  Conjuctiva clear without scleral icterus. Mouth:  Oral mucosa pink and moist. Good dentition. No lesions. Heart:  S1, S2 present without murmurs appreciated. Lungs:  Clear to auscultation bilaterally. No wheezes, rales, or rhonchi. No distress.  Abdomen:  +BS, soft, non-tender and non-distended. No rebound or guarding. No HSM or masses noted. Msk:  Symmetrical without gross deformities. Normal posture. Extremities:  Without edema. Neurologic:  Alert and  oriented x4 Psych:  Alert and cooperative. Normal mood and affect.

## 2018-10-10 ENCOUNTER — Encounter: Payer: Self-pay | Admitting: Internal Medicine

## 2018-10-10 ENCOUNTER — Ambulatory Visit: Payer: PRIVATE HEALTH INSURANCE | Admitting: Gastroenterology

## 2018-10-15 DIAGNOSIS — R079 Chest pain, unspecified: Secondary | ICD-10-CM | POA: Diagnosis not present

## 2018-10-15 DIAGNOSIS — S161XXA Strain of muscle, fascia and tendon at neck level, initial encounter: Secondary | ICD-10-CM | POA: Diagnosis not present

## 2018-10-15 DIAGNOSIS — M5412 Radiculopathy, cervical region: Secondary | ICD-10-CM | POA: Diagnosis not present

## 2018-10-18 ENCOUNTER — Other Ambulatory Visit (HOSPITAL_COMMUNITY): Payer: Self-pay | Admitting: Neurology

## 2018-10-18 ENCOUNTER — Other Ambulatory Visit: Payer: Self-pay

## 2018-10-18 ENCOUNTER — Ambulatory Visit (HOSPITAL_COMMUNITY)
Admission: RE | Admit: 2018-10-18 | Discharge: 2018-10-18 | Disposition: A | Discharge status: Home / Self Care | Payer: BC Managed Care – PPO | Source: Ambulatory Visit | Attending: Neurology | Admitting: Neurology

## 2018-10-18 DIAGNOSIS — M62838 Other muscle spasm: Secondary | ICD-10-CM | POA: Diagnosis not present

## 2018-10-18 DIAGNOSIS — M542 Cervicalgia: Secondary | ICD-10-CM

## 2018-10-18 DIAGNOSIS — G40409 Other generalized epilepsy and epileptic syndromes, not intractable, without status epilepticus: Secondary | ICD-10-CM | POA: Diagnosis not present

## 2018-10-18 DIAGNOSIS — E1142 Type 2 diabetes mellitus with diabetic polyneuropathy: Secondary | ICD-10-CM | POA: Diagnosis not present

## 2018-10-23 ENCOUNTER — Other Ambulatory Visit: Payer: Self-pay | Admitting: Physician Assistant

## 2018-10-23 DIAGNOSIS — M25552 Pain in left hip: Secondary | ICD-10-CM

## 2018-10-28 ENCOUNTER — Ambulatory Visit: Payer: PRIVATE HEALTH INSURANCE | Admitting: Gastroenterology
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
## 2018-10-29 ENCOUNTER — Encounter (HOSPITAL_COMMUNITY): Payer: Self-pay | Admitting: Psychiatry

## 2018-10-29 ENCOUNTER — Ambulatory Visit (INDEPENDENT_AMBULATORY_CARE_PROVIDER_SITE_OTHER): Payer: BC Managed Care – PPO | Admitting: Psychiatry

## 2018-10-29 ENCOUNTER — Other Ambulatory Visit: Payer: Self-pay

## 2018-10-29 DIAGNOSIS — F4310 Post-traumatic stress disorder, unspecified: Secondary | ICD-10-CM

## 2018-10-29 MED ORDER — LAMOTRIGINE 150 MG PO TABS: 150.0000 mg | ORAL_TABLET | Freq: Every day | ORAL | 3 refills | Status: DC

## 2018-10-29 MED ORDER — DULOXETINE HCL 60 MG PO CPEP: 60.0000 mg | ORAL_CAPSULE | Freq: Every day | ORAL | 3 refills | Status: DC

## 2018-10-29 MED ORDER — BUSPIRONE HCL 10 MG PO TABS: 20.0000 mg | ORAL_TABLET | Freq: Three times a day (TID) | ORAL | 3 refills | Status: DC

## 2018-10-29 NOTE — Patient Instructions (Signed)
1. Continue duloxetine 60 mg daily  2. ContinueBuspar20 mg three times a day 3.Continuelamotrigine 150 mg daily  4.Continuetrazodone 100mg  at night as needed for sleep 5.Next appointment: in three months

## 2018-11-05 ENCOUNTER — Telehealth: Payer: Self-pay | Admitting: Cardiovascular Disease

## 2018-11-05 NOTE — Telephone Encounter (Signed)

## 2018-11-08 ENCOUNTER — Ambulatory Visit: Payer: BC Managed Care – PPO | Admitting: Cardiovascular Disease

## 2018-11-11 ENCOUNTER — Encounter: Payer: Self-pay | Admitting: Cardiovascular Disease

## 2018-11-11 ENCOUNTER — Encounter: Payer: Self-pay | Admitting: *Deleted

## 2018-11-11 ENCOUNTER — Telehealth (INDEPENDENT_AMBULATORY_CARE_PROVIDER_SITE_OTHER): Payer: BC Managed Care – PPO | Admitting: Cardiovascular Disease

## 2018-11-11 VITALS — BP 149/94 | HR 97 | Ht 66.0 in | Wt 235.0 lb

## 2018-11-11 DIAGNOSIS — R0789 Other chest pain: Secondary | ICD-10-CM

## 2018-11-11 DIAGNOSIS — I10 Essential (primary) hypertension: Secondary | ICD-10-CM

## 2018-11-11 DIAGNOSIS — R079 Chest pain, unspecified: Secondary | ICD-10-CM

## 2018-11-11 DIAGNOSIS — F439 Reaction to severe stress, unspecified: Secondary | ICD-10-CM

## 2018-11-11 NOTE — Patient Instructions (Addendum)
Medication Instructions:  Continue all current medications.  Labwork: none  Testing/Procedures:  Your physician has requested that you have a lexiscan myoview. For further information please visit [email or web address]. Please follow instruction sheet, as given.  Office will contact with results via phone or letter.    Follow-Up: 3 months   Any Other Special Instructions Will Be Listed Below (If Applicable). Your physician has requested that you regularly monitor and record your blood pressure readings at home. Please take readings 3 x week for 1 month then return log to office for MD review.  You may also call the readings to the office if that is more convenient.    If you need a refill on your cardiac medications before your next appointment, please call your pharmacy.

## 2018-11-11 NOTE — Progress Notes (Signed)
Virtual Visit via Telephone Note   This visit type was conducted due to national recommendations for restrictions regarding the COVID-19 Pandemic (e.g. social distancing) in an effort to limit this patient's exposure and mitigate transmission in our community.  Due to her co-morbid illnesses, this patient is at least at moderate risk for complications without adequate follow up.  This format is felt to be most appropriate for this patient at this time.  The patient did not have access to video technology/had technical difficulties with video requiring transitioning to audio format only (telephone).  All issues noted in this document were discussed and addressed.  No physical exam could be performed with this format.  Please refer to the patient's chart for her  consent to telehealth for [facility].   Date:  11/11/2018   ID:  Jennifer [name], DOB 31-May-1970, MRN [id number]  Patient Location: Home Provider Location: Office  PCP:  Wyatt Haste, NP  Cardiologist:  [name], MD  Electrophysiologist:  None   Evaluation Performed:  New Consult  Chief Complaint:  Chest pain  History of Present Illness:    Jennifer [name] is a 48 y.o. female with chest pains. They have been occurring more frequently lately. She was going to court the other day and had chest pain halfway up the stairs. It was on the right side of her chest. It lasted 5-10 minutes. She felt short of breath and dizzy. She denies syncope.  Symptoms started about a month ago. She admits to being under a lot of stress.  I last evaluated her in May 2017.  She underwent a low risk nuclear stress test in March 2015, EF 63%.  She has a history of COPD and tobacco use.  She also has a history of palpitations. She smokes about 4 cigarettes a day.  The patient does not have symptoms concerning for COVID-19 infection (fever, chills, cough).    Past Medical History:  Diagnosis Date  . Allergy   . Anemia   . Anxiety   .  Arthritis    neck  . Asthma   . Diabetes mellitus without complication ([place])   . GERD (gastroesophageal reflux disease)   . History of flexible sigmoidoscopy 1999   Normal to 60cm,  . Hypertension   . Neuromuscular disorder ([place])   . Ovarian cancer ([place])    skin cancer  . Panic attack   . PTSD (post-traumatic stress disorder)   . S/P endoscopy Feb 2012   Nl, s/p 56-French [facility] dilator, biopsy benign  . S/P endoscopy 2008   Mild gastritis  . Seizures ([place])   . Substance abuse ([place])    drug addiction   Past Surgical History:  Procedure Laterality Date  . ABDOMINAL HYSTERECTOMY     ovarian tumors  . CHOLECYSTECTOMY    . COLONOSCOPY  03/01/2011   Rourk-External hemorrhoids/otherwise normal rectum and colon.  . COLONOSCOPY WITH PROPOFOL N/A 08/08/2018   entire examined colon is normal. The distal rectum and anal verge are normal on retroflexion view. Repeat in 5 years.   . ESOPHAGOGASTRODUODENOSCOPY  2008   gastritis  . ESOPHAGOGASTRODUODENOSCOPY  2012   Moderate sized hiatal hernia, non-H. pylori gastritis  . ESOPHAGOGASTRODUODENOSCOPY N/A 11/17/2013   RMR: Mild erosive reflux esophagitis. Patulous EG junction  . ESOPHAGOGASTRODUODENOSCOPY (EGD) WITH PROPOFOL N/A 08/08/2018   Erosive reflux esophagitis. Dilated. Small hiatal hernia. Otherwise normal.   . HERNIA REPAIR     Incisional with mesh  . MALONEY DILATION  08/08/2018  Procedure: MALONEY DILATION;  Surgeon: Daneil Dolin, MD;  Location: [facility];  Service: Endoscopy;;  . PARTIAL HYSTERECTOMY    . SKIN CANCER EXCISION     left bicep  . TONSILLECTOMY       Current Meds  Medication Sig  . busPIRone (BUSPAR) 10 MG tablet Take 2 tablets (20 mg total) by mouth 3 (three) times daily.  . cetirizine (ZYRTEC) 10 MG tablet Take 1 tablet (10 mg total) by mouth every morning.  Derrill Memo ON 11/27/2018] DULoxetine (CYMBALTA) 60 MG capsule Take 1 capsule (60 mg total) by mouth daily.  . fluorometholone (FML) 0.1 % ophthalmic  suspension Place 1 drop into both eyes 3 (three) times daily.  . fluticasone (FLONASE) 50 MCG/ACT nasal spray Place 1 spray into both nostrils daily.  Derrill Memo ON 11/27/2018] lamoTRIgine (LAMICTAL) 150 MG tablet Take 1 tablet (150 mg total) by mouth daily.  . Levetiracetam 750 MG TB24 Take 3 tablets (2,250 mg total) by mouth at bedtime.  Marland Kitchen linaclotide (LINZESS) 145 MCG CAPS capsule Take 1 capsule (145 mcg total) by mouth daily before breakfast.  . meloxicam (MOBIC) 7.5 MG tablet TAKE (1) TABLET BY MOUTH ONCE DAILY.  Marland Kitchen ondansetron (ZOFRAN) 4 MG tablet Take 1 tablet (4 mg total) by mouth every 8 (eight) hours as needed for nausea or vomiting.  Marland Kitchen oxybutynin (DITROPAN) 5 MG tablet Take 1 tablet (5 mg total) by mouth 2 (two) times daily.  . pantoprazole (PROTONIX) 40 MG tablet Take 40 mg by mouth 2 (two) times daily.  . traZODone (DESYREL) 100 MG tablet TAKE 1/2 TO 1 TABLET AT BEDTIME AS NEEDED FOR SLEEP.  . Vitamin D, Ergocalciferol, (DRISDOL) 1.25 MG (50000 UT) CAPS capsule Take 1 capsule (50,000 Units total) by mouth every 7 (seven) days.     Allergies:   Metrizamide, Shellfish allergy, Amoxapine and related, Codeine, Hydrocodone, Ivp dye [iodinated diagnostic agents], and Latex   Social History   Tobacco Use  . Smoking status: Current Some Day Smoker    Packs/day: 1.50    Years: 26.00    Pack years: 39.00    Types: Cigarettes    Start date: 05/03/1984  . Smokeless tobacco: Never Used  . Tobacco comment: intermittently smoking some  Substance Use Topics  . Alcohol use: Not Currently    Alcohol/week: 1.0 standard drinks    Types: 1 Cans of beer per week    Comment: occasionally  . Drug use: No     Family Hx: The patient's family history includes Alcohol abuse in her father, maternal uncle, mother, paternal grandmother, and paternal uncle; Arthritis in her mother; Breast cancer in her maternal grandmother; Breast cancer (age of onset: 44) in her daughter; COPD in her mother; Cancer in  her maternal grandfather, maternal grandmother, paternal grandfather, and paternal grandmother; Colon cancer in her maternal grandfather; Colon polyps (age of onset: 8) in her mother; Crohn's disease in her cousin and maternal aunt; Depression in her mother; Drug abuse in her father, mother, and paternal uncle; Early death (age of onset: 50) in her father; Heart disease in her mother; Hyperlipidemia in her mother; Hypertension in her mother; Learning disabilities in her father and mother; Mental illness in her mother; Pancreatic cancer in her maternal grandfather.  ROS:   Please see the history of present illness.     All other systems reviewed and are negative.   Prior CV studies:   The following studies were reviewed today:  See above  Labs/Other Tests and  Data Reviewed:    EKG:  An ECG dated 11/15/17 was personally reviewed today and demonstrated:  sinus rhythm with nonspecific T wave abnormalities  Recent Labs: 02/07/2018: BUN 13; Creatinine, Ser 1.29; Potassium 4.6; Sodium 140; TSH 1.750 04/16/2018: ALT 8; Hemoglobin 12.5; Platelets 308   Recent Lipid Panel Lab Results  Component Value Date/Time   CHOL 172 02/07/2018 10:11 AM   TRIG 74 02/07/2018 10:11 AM   HDL 49 02/07/2018 10:11 AM   CHOLHDL 3.5 02/07/2018 10:11 AM   CHOLHDL 3.4 10/12/2016 03:17 PM   LDLCALC 108 (H) 02/07/2018 10:11 AM   LDLCALC 96 10/12/2016 03:17 PM    Wt Readings from Last 3 Encounters:  11/11/18 235 lb (106.6 kg)  09/09/18 220 lb (99.8 kg)  09/05/18 228 lb 6.4 oz (103.6 kg)     Objective:    Vital Signs:  BP (!) 149/94   Pulse 97   Ht 5\' 6"  (1.676 m)   Wt 235 lb (106.6 kg)   BMI 37.93 kg/m    VITAL SIGNS:  reviewed  ASSESSMENT & PLAN:    1. Chest pain: Unclear etiology.  She has a history of tobacco use.  She also admits to being under a lot of stress. I will proceed with a nuclear myocardial perfusion imaging study to evaluate for ischemic heart disease (Lexiscan Myoview).  2. High  blood pressure: She does not carry a formal diagnosis of hypertension.  I asked her to check her blood pressure 3 times per week for the next month to see if antihypertensive therapy is indicated.  3. Stress/anxiety: She admits to being under a lot of stress at home.    COVID-19 Education: The signs and symptoms of COVID-19 were discussed with the patient and how to seek care for testing (follow up with PCP or arrange E-visit).  The importance of social distancing was discussed today.  Time:   Today, I have spent 15 minutes with the patient with telehealth technology discussing the above problems.     Medication Adjustments/Labs and Tests Ordered: Current medicines are reviewed at length with the patient today.  Concerns regarding medicines are outlined above.   Tests Ordered: No orders of the defined types were placed in this encounter.   Medication Changes: No orders of the defined types were placed in this encounter.   Follow Up:  Virtual Visit or In Person in 3 month(s)  Signed, [name], MD  11/11/2018 11:30 AM    [place]

## 2018-11-11 NOTE — Addendum Note (Signed)
Addended by: Laurine Blazer on: 11/11/2018 11:51 AM   Modules accepted: Orders

## 2018-11-12 ENCOUNTER — Telehealth: Payer: Self-pay | Admitting: Cardiovascular Disease

## 2018-11-12 NOTE — Telephone Encounter (Signed)
Pre-cert Verification for the following procedure    Lexiscan myoview scheduled for 11-20-2018 at [facility]

## 2018-11-20 ENCOUNTER — Other Ambulatory Visit: Payer: Self-pay

## 2018-11-20 ENCOUNTER — Encounter (HOSPITAL_COMMUNITY)
Admission: RE | Admit: 2018-11-20 | Discharge: 2018-11-20 | Disposition: A | Discharge status: Home / Self Care | Payer: BC Managed Care – PPO | Source: Ambulatory Visit | Attending: Cardiovascular Disease | Admitting: Cardiovascular Disease

## 2018-11-20 ENCOUNTER — Encounter (HOSPITAL_BASED_OUTPATIENT_CLINIC_OR_DEPARTMENT_OTHER)
Admission: RE | Admit: 2018-11-20 | Discharge: 2018-11-20 | Disposition: A | Discharge status: Home / Self Care | Payer: BC Managed Care – PPO | Source: Ambulatory Visit | Attending: Cardiovascular Disease | Admitting: Cardiovascular Disease

## 2018-11-20 DIAGNOSIS — R079 Chest pain, unspecified: Secondary | ICD-10-CM

## 2018-11-20 LAB — NM MYOCAR MULTI W/SPECT W/WALL MOTION / EF
LV dias vol: 88 mL (ref 46–106)
LV sys vol: 41 mL
Peak HR: 106 {beats}/min
RATE: 0.38
Rest HR: 62 {beats}/min
SDS: 0
SRS: 1
SSS: 1
TID: 1.14

## 2018-11-20 MED ORDER — TECHNETIUM TC 99M TETROFOSMIN IV KIT
10.0000 | PACK | Freq: Once | INTRAVENOUS | Status: AC | PRN
  Administered 2018-11-20: 10 via INTRAVENOUS

## 2018-11-20 MED ORDER — TECHNETIUM TC 99M TETROFOSMIN IV KIT
30.0000 | PACK | Freq: Once | INTRAVENOUS | Status: AC | PRN
  Administered 2018-11-20: 32.4 via INTRAVENOUS

## 2018-11-20 MED ORDER — SODIUM CHLORIDE 0.9% FLUSH
INTRAVENOUS | Status: AC
  Administered 2018-11-20: 10 mL via INTRAVENOUS
  Filled 2018-11-20: qty 10

## 2018-11-20 MED ORDER — REGADENOSON 0.4 MG/5ML IV SOLN
INTRAVENOUS | Status: AC
  Administered 2018-11-20: 0.4 mg via INTRAVENOUS
  Filled 2018-11-20: qty 5

## 2018-11-27 ENCOUNTER — Other Ambulatory Visit: Payer: Self-pay | Admitting: Physician Assistant

## 2018-11-27 ENCOUNTER — Other Ambulatory Visit: Payer: Self-pay | Admitting: Neurology

## 2018-11-27 DIAGNOSIS — M25552 Pain in left hip: Secondary | ICD-10-CM

## 2018-12-05 ENCOUNTER — Other Ambulatory Visit: Payer: Self-pay | Admitting: Physician Assistant

## 2018-12-05 DIAGNOSIS — M25552 Pain in left hip: Secondary | ICD-10-CM

## 2018-12-24 ENCOUNTER — Other Ambulatory Visit: Payer: Self-pay | Admitting: Physician Assistant

## 2018-12-24 DIAGNOSIS — M25552 Pain in left hip: Secondary | ICD-10-CM

## 2019-01-01 ENCOUNTER — Other Ambulatory Visit: Payer: Self-pay | Admitting: Physician Assistant

## 2019-01-01 DIAGNOSIS — M25552 Pain in left hip: Secondary | ICD-10-CM

## 2019-01-16 ENCOUNTER — Other Ambulatory Visit: Payer: Self-pay | Admitting: Physician Assistant

## 2019-01-16 DIAGNOSIS — M25552 Pain in left hip: Secondary | ICD-10-CM

## 2019-01-28 ENCOUNTER — Ambulatory Visit (HOSPITAL_COMMUNITY): Payer: Medicaid Other | Admitting: Psychiatry

## 2019-01-29 ENCOUNTER — Other Ambulatory Visit: Payer: Self-pay | Admitting: Physician Assistant

## 2019-01-29 DIAGNOSIS — M25552 Pain in left hip: Secondary | ICD-10-CM

## 2019-02-04 ENCOUNTER — Other Ambulatory Visit: Payer: Self-pay | Admitting: Physician Assistant

## 2019-02-04 DIAGNOSIS — M25552 Pain in left hip: Secondary | ICD-10-CM

## 2019-02-17 ENCOUNTER — Encounter: Payer: Self-pay | Admitting: Psychiatry

## 2019-02-17 ENCOUNTER — Other Ambulatory Visit: Payer: Self-pay

## 2019-02-17 ENCOUNTER — Ambulatory Visit (INDEPENDENT_AMBULATORY_CARE_PROVIDER_SITE_OTHER): Payer: 59 | Admitting: Psychiatry

## 2019-02-17 DIAGNOSIS — F0630 Mood disorder due to known physiological condition, unspecified: Secondary | ICD-10-CM

## 2019-02-17 DIAGNOSIS — F4310 Post-traumatic stress disorder, unspecified: Secondary | ICD-10-CM

## 2019-02-17 MED ORDER — LAMOTRIGINE 150 MG PO TABS: 150.0000 mg | ORAL_TABLET | Freq: Every day | ORAL | 2 refills | Status: DC

## 2019-02-17 MED ORDER — BUSPIRONE HCL 10 MG PO TABS: 20.0000 mg | ORAL_TABLET | Freq: Three times a day (TID) | ORAL | 2 refills | Status: DC

## 2019-02-17 MED ORDER — DULOXETINE HCL 60 MG PO CPEP: 60.0000 mg | ORAL_CAPSULE | Freq: Every day | ORAL | 2 refills | Status: DC

## 2019-02-17 MED ORDER — TRAZODONE HCL 100 MG PO TABS: ORAL_TABLET | ORAL | 2 refills | Status: DC

## 2019-02-17 NOTE — Progress Notes (Signed)
[place] MD OP Progress Note  Virtual Visit via Telephone Note  I connected with Jennifer [name] on 02/17/19 at  1:00 PM EST by telephone and verified that I am speaking with the correct person using two identifiers.    I discussed the limitations, risks, security and privacy concerns of performing an evaluation and management service by telephone and the availability of in person appointments. I also discussed with the patient that there may be a patient responsible charge related to this service. The patient expressed understanding and agreed to proceed.    02/17/2019 12:48 PM Jennifer [name]  MRN:  [id number]  Chief Complaint: " I am doing well."  HPI: Patient reported doing well on her current regimen.  She stated that she had 2 deaths in her family since she last saw Dr. Modesta Messing.  She received some counseling from a counselor in her PCPs office.  She feels better now and she is doing better.  She informed that Dr. Modesta Messing had arranged for vocational rehab for her in the past.  She needs to fill out some paperwork and hopefully will be able to find a job that is suitable given her health conditions.  She stated that overall things are progressing well for her.  She is able to sleep well with the help of trazodone.  She denied any acute stressors or concerns at this time.  Visit Diagnosis:    ICD-10-CM   1. Mood disorder in conditions classified elsewhere  F06.30 DULoxetine (CYMBALTA) 60 MG capsule    busPIRone (BUSPAR) 10 MG tablet    lamoTRIgine (LAMICTAL) 150 MG tablet    traZODone (DESYREL) 100 MG tablet  2. PTSD (post-traumatic stress disorder)  F43.10 DULoxetine (CYMBALTA) 60 MG capsule    busPIRone (BUSPAR) 10 MG tablet    Past Psychiatric History: PTSD, mood disorder  Past Medical History:  Past Medical History:  Diagnosis Date  . Allergy   . Anemia   . Anxiety   . Arthritis    neck  . Asthma   . Diabetes mellitus without complication ([place])   . GERD (gastroesophageal reflux  disease)   . History of flexible sigmoidoscopy 1999   Normal to 60cm,  . Hypertension   . Neuromuscular disorder ([place])   . Ovarian cancer ([place])    skin cancer  . Panic attack   . PTSD (post-traumatic stress disorder)   . S/P endoscopy Feb 2012   Nl, s/p 56-French [facility] dilator, biopsy benign  . S/P endoscopy 2008   Mild gastritis  . Seizures ([place])   . Substance abuse ([place])    drug addiction    Past Surgical History:  Procedure Laterality Date  . ABDOMINAL HYSTERECTOMY     ovarian tumors  . CHOLECYSTECTOMY    . COLONOSCOPY  03/01/2011   Rourk-External hemorrhoids/otherwise normal rectum and colon.  . COLONOSCOPY WITH PROPOFOL N/A 08/08/2018   entire examined colon is normal. The distal rectum and anal verge are normal on retroflexion view. Repeat in 5 years.   . ESOPHAGOGASTRODUODENOSCOPY  2008   gastritis  . ESOPHAGOGASTRODUODENOSCOPY  2012   Moderate sized hiatal hernia, non-H. pylori gastritis  . ESOPHAGOGASTRODUODENOSCOPY N/A 11/17/2013   RMR: Mild erosive reflux esophagitis. Patulous EG junction  . ESOPHAGOGASTRODUODENOSCOPY (EGD) WITH PROPOFOL N/A 08/08/2018   Erosive reflux esophagitis. Dilated. Small hiatal hernia. Otherwise normal.   . HERNIA REPAIR     Incisional with mesh  . MALONEY DILATION  08/08/2018   Procedure: Venia Minks DILATION;  Surgeon: Daneil Dolin,  MD;  Location: [facility] SUITE;  Service: Endoscopy;;  . PARTIAL HYSTERECTOMY    . SKIN CANCER EXCISION     left bicep  . TONSILLECTOMY      Family Psychiatric History: see below  Family History:  Family History  Problem Relation Age of Onset  . Crohn's disease Maternal Aunt   . Breast cancer Maternal Grandmother   . Cancer Maternal Grandmother   . Colon cancer Maternal Grandfather        93  . Pancreatic cancer Maternal Grandfather   . Cancer Maternal Grandfather   . Crohn's disease Cousin        x 2 cousins  . Breast cancer Daughter 44  . Arthritis Mother   . COPD Mother   . Depression Mother    . Drug abuse Mother   . Heart disease Mother   . Hypertension Mother   . Hyperlipidemia Mother   . Learning disabilities Mother   . Mental illness Mother   . Alcohol abuse Mother   . Colon polyps Mother 82       tubular adenoma  . Early death Father 73       murder  . Alcohol abuse Father   . Drug abuse Father   . Learning disabilities Father   . Cancer Paternal Grandmother   . Alcohol abuse Paternal Grandmother   . Cancer Paternal Grandfather   . Alcohol abuse Maternal Uncle   . Alcohol abuse Paternal Uncle   . Drug abuse Paternal Uncle     Social History:  Social History   Socioeconomic History  . Marital status: Married    Spouse name: [name]  . Number of children: Not on file  . Years of education: Not on file  . Highest education level: 11th grade  Occupational History  . Occupation: disabled    Fish farm manager: NOT EMPLOYED  Tobacco Use  . Smoking status: Current Some Day Smoker    Packs/day: 1.50    Years: 26.00    Pack years: 39.00    Types: Cigarettes    Start date: 05/03/1984  . Smokeless tobacco: Never Used  . Tobacco comment: intermittently smoking some  Substance and Sexual Activity  . Alcohol use: Not Currently    Alcohol/week: 1.0 standard drinks    Types: 1 Cans of beer per week    Comment: occasionally  . Drug use: No  . Sexual activity: Not Currently    Birth control/protection: Surgical  Other Topics Concern  . Not on file  Social History Narrative   Lives at home with her husband.   Right handed.    1-2 cups of coffee 3 times per wk.    Intermittent soda.   Social Determinants of Health   Financial Resource Strain:   . Difficulty of Paying Living Expenses: Not on file  Food Insecurity:   . Worried About Charity fundraiser in the Last Year: Not on file  . Ran Out of Food in the Last Year: Not on file  Transportation Needs:   . Lack of Transportation (Medical): Not on file  . Lack of Transportation (Non-Medical): Not on file  Physical  Activity:   . Days of Exercise per Week: Not on file  . Minutes of Exercise per Session: Not on file  Stress:   . Feeling of Stress : Not on file  Social Connections:   . Frequency of Communication with Friends and Family: Not on file  . Frequency of Social Gatherings with Friends and Family:  Not on file  . Attends Religious Services: Not on file  . Active Member of Clubs or Organizations: Not on file  . Attends Archivist Meetings: Not on file  . Marital Status: Not on file    Allergies:  Allergies  Allergen Reactions  . Metrizamide Rash and Swelling    Hands swelling/rash  . Shellfish Allergy Swelling    Swells tongue  . Amoxapine And Related     Nausea   . Codeine Other (See Comments)    itching  . Hydrocodone Itching    Rash and itching  . Ivp Dye [Iodinated Diagnostic Agents] Rash    Hands swelling/rash  . Latex Rash    Metabolic Disorder Labs: Lab Results  Component Value Date   HGBA1C 5.4 02/07/2018   MPG 103 10/12/2016   No results found for: PROLACTIN Lab Results  Component Value Date   CHOL 172 02/07/2018   TRIG 74 02/07/2018   HDL 49 02/07/2018   CHOLHDL 3.5 02/07/2018   LDLCALC 108 (H) 02/07/2018   LDLCALC 96 10/12/2016   Lab Results  Component Value Date   TSH 1.750 02/07/2018   TSH 2.83 02/15/2017    Therapeutic Level Labs: No results found for: LITHIUM No results found for: VALPROATE No components found for:  CBMZ  Current Medications: Current Outpatient Medications  Medication Sig Dispense Refill  . busPIRone (BUSPAR) 10 MG tablet Take 2 tablets (20 mg total) by mouth 3 (three) times daily. 168 tablet 2  . cetirizine (ZYRTEC) 10 MG tablet Take 1 tablet (10 mg total) by mouth every morning. 90 tablet 3  . DULoxetine (CYMBALTA) 60 MG capsule Take 1 capsule (60 mg total) by mouth daily. 28 capsule 2  . fluorometholone (FML) 0.1 % ophthalmic suspension Place 1 drop into both eyes 3 (three) times daily.    . fluticasone (FLONASE)  50 MCG/ACT nasal spray Place 1 spray into both nostrils daily. 16 g 11  . lamoTRIgine (LAMICTAL) 150 MG tablet Take 1 tablet (150 mg total) by mouth daily. 28 tablet 2  . Levetiracetam 750 MG TB24 Take 3 tablets (2,250 mg total) by mouth at bedtime. 270 tablet 4  . linaclotide (LINZESS) 145 MCG CAPS capsule Take 1 capsule (145 mcg total) by mouth daily before breakfast. 90 capsule 3  . meloxicam (MOBIC) 7.5 MG tablet TAKE (1) TABLET BY MOUTH ONCE DAILY. 30 tablet 0  . ondansetron (ZOFRAN) 4 MG tablet Take 1 tablet (4 mg total) by mouth every 8 (eight) hours as needed for nausea or vomiting. 60 tablet 1  . oxybutynin (DITROPAN) 5 MG tablet Take 1 tablet (5 mg total) by mouth 2 (two) times daily. 60 tablet 11  . pantoprazole (PROTONIX) 40 MG tablet Take 40 mg by mouth 2 (two) times daily.    . traZODone (DESYREL) 100 MG tablet TAKE 1/2 TO 1 TABLET AT BEDTIME AS NEEDED FOR SLEEP. 28 tablet 2  . Vitamin D, Ergocalciferol, (DRISDOL) 1.25 MG (50000 UT) CAPS capsule Take 1 capsule (50,000 Units total) by mouth every 7 (seven) days. 5 capsule 11   No current facility-administered medications for this visit.     Musculoskeletal: Strength & Muscle Tone: unable to assess due to telemed visit Gait & Station: unable to assess due to telemed visit Patient leans: unable to assess due to telemed visit  Psychiatric Specialty Exam: Review of Systems  There were no vitals taken for this visit.There is no height or weight on file to calculate BMI.  General Appearance:  unable to assess due to phone visit  Eye Contact:  unable to assess due to phone visit  Speech:  Clear and Coherent and Normal Rate  Volume:  Normal  Mood:  Euthymic  Affect:  Congruent  Thought Process:  Goal Directed, Linear and Descriptions of Associations: Intact  Orientation:  Full (Time, Place, and Person)  Thought Content: Logical   Suicidal Thoughts:  No  Homicidal Thoughts:  No  Memory:  Recent;   Good Remote;   Good   Judgement:  Fair  Insight:  Fair  Psychomotor Activity:  Normal  Concentration:  Concentration: Good and Attention Span: Good  Recall:  Good  Fund of Knowledge: Good  Language: Good  Akathisia:  Negative  Handed:  Right  AIMS (if indicated): not done  Assets:  Communication Skills Desire for Improvement Financial Resources/Insurance Housing Social Support  ADL's:  Intact  Cognition: WNL  Sleep:  Good   Screenings: Mini-Mental     Office Visit from 04/11/2018 in [facility]  Total Score (max 30 points )  20    PHQ2-9     Office Visit from 03/22/2018 in [facility] Office Visit from 02/07/2018 in [place] Office Visit from 02/15/2017 in [facility] Office Visit from 10/12/2016 in [facility]  PHQ-2 Total Score  2  0  0  0  PHQ-9 Total Score  7  --  --  --       Assessment and Plan: Patient reported doing well on her current regimen.  She is working with vocational rehab to find a job that is suitable for her.  1. Mood disorder in conditions classified elsewhere  - DULoxetine (CYMBALTA) 60 MG capsule; Take 1 capsule (60 mg total) by mouth daily.  Dispense: 28 capsule; Refill: 2 - busPIRone (BUSPAR) 10 MG tablet; Take 2 tablets (20 mg total) by mouth 3 (three) times daily.  Dispense: 168 tablet; Refill: 2 - lamoTRIgine (LAMICTAL) 150 MG tablet; Take 1 tablet (150 mg total) by mouth daily.  Dispense: 28 tablet; Refill: 2 - traZODone (DESYREL) 100 MG tablet; TAKE 1/2 TO 1 TABLET AT BEDTIME AS NEEDED FOR SLEEP.  Dispense: 28 tablet; Refill: 2  2. PTSD (post-traumatic stress disorder)  - DULoxetine (CYMBALTA) 60 MG capsule; Take 1 capsule (60 mg total) by mouth daily.  Dispense: 28 capsule; Refill: 2 - busPIRone (BUSPAR) 10 MG tablet; Take 2 tablets (20 mg total) by mouth 3 (three) times daily.  Dispense: 168 tablet; Refill: 2  Continue same medication regimen. Follow up in 3  months.   [place] Crane, MD 02/17/2019, 12:48 PM

## 2019-02-19 ENCOUNTER — Other Ambulatory Visit: Payer: Self-pay | Admitting: Physician Assistant

## 2019-02-19 DIAGNOSIS — M25552 Pain in left hip: Secondary | ICD-10-CM

## 2019-02-21 ENCOUNTER — Other Ambulatory Visit: Payer: Self-pay | Admitting: Physician Assistant

## 2019-02-21 DIAGNOSIS — M25552 Pain in left hip: Secondary | ICD-10-CM

## 2019-02-26 ENCOUNTER — Other Ambulatory Visit (HOSPITAL_COMMUNITY): Payer: Self-pay | Admitting: Neurology

## 2019-02-26 DIAGNOSIS — M25562 Pain in left knee: Secondary | ICD-10-CM

## 2019-03-03 ENCOUNTER — Other Ambulatory Visit: Payer: Self-pay | Admitting: Physician Assistant

## 2019-03-03 DIAGNOSIS — M25552 Pain in left hip: Secondary | ICD-10-CM

## 2019-03-04 ENCOUNTER — Other Ambulatory Visit: Payer: Self-pay

## 2019-03-04 ENCOUNTER — Ambulatory Visit (HOSPITAL_COMMUNITY)
Admission: RE | Admit: 2019-03-04 | Discharge: 2019-03-04 | Disposition: A | Discharge status: Home / Self Care | Payer: 59 | Source: Ambulatory Visit | Attending: Neurology | Admitting: Neurology

## 2019-03-04 ENCOUNTER — Encounter (HOSPITAL_COMMUNITY): Payer: Self-pay

## 2019-03-04 DIAGNOSIS — M25562 Pain in left knee: Secondary | ICD-10-CM | POA: Insufficient documentation

## 2019-03-07 ENCOUNTER — Other Ambulatory Visit: Payer: Self-pay | Admitting: Physician Assistant

## 2019-03-07 DIAGNOSIS — M25552 Pain in left hip: Secondary | ICD-10-CM

## 2019-03-24 ENCOUNTER — Telehealth (HOSPITAL_COMMUNITY): Payer: Self-pay | Admitting: *Deleted

## 2019-03-24 ENCOUNTER — Encounter (HOSPITAL_COMMUNITY): Payer: Self-pay | Admitting: Psychiatry

## 2019-03-24 NOTE — Telephone Encounter (Signed)
Spoke with Patient R.O.I. needed Jury Duty Letter is available.

## 2019-04-18 ENCOUNTER — Ambulatory Visit (INDEPENDENT_AMBULATORY_CARE_PROVIDER_SITE_OTHER): Payer: 59 | Admitting: Cardiovascular Disease

## 2019-04-18 ENCOUNTER — Encounter: Payer: Self-pay | Admitting: Cardiovascular Disease

## 2019-04-18 ENCOUNTER — Other Ambulatory Visit: Payer: Self-pay

## 2019-04-18 VITALS — BP 124/82 | HR 79 | Ht 66.0 in | Wt 245.4 lb

## 2019-04-18 DIAGNOSIS — R0600 Dyspnea, unspecified: Secondary | ICD-10-CM | POA: Diagnosis not present

## 2019-04-18 DIAGNOSIS — Z720 Tobacco use: Secondary | ICD-10-CM

## 2019-04-18 DIAGNOSIS — R0609 Other forms of dyspnea: Secondary | ICD-10-CM

## 2019-04-18 DIAGNOSIS — R079 Chest pain, unspecified: Secondary | ICD-10-CM | POA: Diagnosis not present

## 2019-04-18 NOTE — Patient Instructions (Addendum)
Medication Instructions:  Continue all current medications.  Labwork: none  Testing/Procedures: none  Follow-Up: As needed.    Any Other Special Instructions Will Be Listed Below (If Applicable).  If you need a refill on your cardiac medications before your next appointment, please call your pharmacy.  

## 2019-04-18 NOTE — Progress Notes (Signed)
SUBJECTIVE: The patient returns for follow-up after undergoing cardiovascular testing performed for the evaluation of chest pain.  Nuclear stress test on 11/20/2018 was low risk with soft tissue attenuation and no clear evidence of ischemia, EF 53%.  She has a history of COPD and tobacco use.  She also has a history of palpitations and anxiety. She smokes about 4 cigarettes a day.  She has shortness of breath and wheezing which her husband noticed.  She occasionally has a sharp chest pain lasting 1 second when taking a deep breath.     Review of Systems: As per "subjective", otherwise negative.  Allergies  Allergen Reactions  . Metrizamide Rash and Swelling    Hands swelling/rash  . Shellfish Allergy Swelling    Swells tongue  . Amoxapine And Related     Nausea   . Codeine Other (See Comments)    itching  . Hydrocodone Itching    Rash and itching  . Ivp Dye [Iodinated Diagnostic Agents] Rash    Hands swelling/rash  . Latex Rash    Current Outpatient Medications  Medication Sig Dispense Refill  . busPIRone (BUSPAR) 10 MG tablet Take 2 tablets (20 mg total) by mouth 3 (three) times daily. 168 tablet 2  . cetirizine (ZYRTEC) 10 MG tablet Take 1 tablet (10 mg total) by mouth every morning. 90 tablet 3  . DULoxetine (CYMBALTA) 60 MG capsule Take 1 capsule (60 mg total) by mouth daily. 28 capsule 2  . fluorometholone (FML) 0.1 % ophthalmic suspension Place 1 drop into both eyes 3 (three) times daily.    . fluticasone (FLONASE) 50 MCG/ACT nasal spray Place 1 spray into both nostrils daily. 16 g 11  . lamoTRIgine (LAMICTAL) 150 MG tablet Take 1 tablet (150 mg total) by mouth daily. 28 tablet 2  . Levetiracetam 750 MG TB24 Take 3 tablets (2,250 mg total) by mouth at bedtime. 270 tablet 4  . meloxicam (MOBIC) 7.5 MG tablet TAKE (1) TABLET BY MOUTH ONCE DAILY. 30 tablet 0  . ondansetron (ZOFRAN) 4 MG tablet Take 1 tablet (4 mg total) by mouth every 8 (eight) hours as needed  for nausea or vomiting. 60 tablet 1  . oxybutynin (DITROPAN) 5 MG tablet Take 1 tablet (5 mg total) by mouth 2 (two) times daily. 60 tablet 11  . pantoprazole (PROTONIX) 40 MG tablet Take 40 mg by mouth 2 (two) times daily.    . traZODone (DESYREL) 100 MG tablet TAKE 1/2 TO 1 TABLET AT BEDTIME AS NEEDED FOR SLEEP. 28 tablet 2  . Vitamin D, Ergocalciferol, (DRISDOL) 1.25 MG (50000 UT) CAPS capsule Take 1 capsule (50,000 Units total) by mouth every 7 (seven) days. 5 capsule 11   No current facility-administered medications for this visit.    Past Medical History:  Diagnosis Date  . Allergy   . Anemia   . Anxiety   . Arthritis    neck  . Asthma   . Diabetes mellitus without complication ([place])   . GERD (gastroesophageal reflux disease)   . History of flexible sigmoidoscopy 1999   Normal to 60cm,  . Hypertension   . Neuromuscular disorder ([place])   . Ovarian cancer ([place])    skin cancer  . Panic attack   . PTSD (post-traumatic stress disorder)   . S/P endoscopy Feb 2012   Nl, s/p 56-French [facility] dilator, biopsy benign  . S/P endoscopy 2008   Mild gastritis  . Seizures ([place])   . Substance abuse ([place])  drug addiction    Past Surgical History:  Procedure Laterality Date  . ABDOMINAL HYSTERECTOMY     ovarian tumors  . CHOLECYSTECTOMY    . COLONOSCOPY  03/01/2011   Rourk-External hemorrhoids/otherwise normal rectum and colon.  . COLONOSCOPY WITH PROPOFOL N/A 08/08/2018   entire examined colon is normal. The distal rectum and anal verge are normal on retroflexion view. Repeat in 5 years.   . ESOPHAGOGASTRODUODENOSCOPY  2008   gastritis  . ESOPHAGOGASTRODUODENOSCOPY  2012   Moderate sized hiatal hernia, non-H. pylori gastritis  . ESOPHAGOGASTRODUODENOSCOPY N/A 11/17/2013   RMR: Mild erosive reflux esophagitis. Patulous EG junction  . ESOPHAGOGASTRODUODENOSCOPY (EGD) WITH PROPOFOL N/A 08/08/2018   Erosive reflux esophagitis. Dilated. Small hiatal hernia. Otherwise normal.   .  HERNIA REPAIR     Incisional with mesh  . MALONEY DILATION  08/08/2018   Procedure: MALONEY DILATION;  Surgeon: Daneil Dolin, MD;  [facility];  Service: Endoscopy;;  . PARTIAL HYSTERECTOMY    . SKIN CANCER EXCISION     left bicep  . TONSILLECTOMY      Social History   Socioeconomic History  . Marital status: Married    Spouse name: [name]  . Number of children: Not on file  . Years of education: Not on file  . Highest education level: 11th grade  Occupational History  . Occupation: disabled    Fish farm manager: NOT EMPLOYED  Tobacco Use  . Smoking status: Current Some Day Smoker    Packs/day: 1.50    Years: 26.00    Pack years: 39.00    Types: Cigarettes    Start date: 05/03/1984  . Smokeless tobacco: Never Used  . Tobacco comment: started back about 2 weeks ago   Substance and Sexual Activity  . Alcohol use: Not Currently    Alcohol/week: 1.0 standard drinks    Types: 1 Cans of beer per week    Comment: occasionally  . Drug use: No  . Sexual activity: Not Currently    Birth control/protection: Surgical  Other Topics Concern  . Not on file  Social History Narrative   Lives at home with her husband.   Right handed.    1-2 cups of coffee 3 times per wk.    Intermittent soda.   Social Determinants of Health   Financial Resource Strain:   . Difficulty of Paying Living Expenses:   Food Insecurity:   . Worried About Charity fundraiser in the Last Year:   . Arboriculturist in the Last Year:   Transportation Needs:   . Film/video editor (Medical):   Marland Kitchen Lack of Transportation (Non-Medical):   Physical Activity:   . Days of Exercise per Week:   . Minutes of Exercise per Session:   Stress:   . Feeling of Stress :   Social Connections:   . Frequency of Communication with Friends and Family:   . Frequency of Social Gatherings with Friends and Family:   . Attends Religious Services:   . Active Member of Clubs or Organizations:   . Attends Theatre manager Meetings:   Marland Kitchen Marital Status:   Intimate Partner Violence:   . Fear of Current or Ex-Partner:   . Emotionally Abused:   Marland Kitchen Physically Abused:   . Sexually Abused:     Orson Slick, LPN was present throughout the entirety of the encounter.  Vitals:   04/18/19 1436  BP: 124/82  Pulse: 79  SpO2: 98%  Weight: 245 lb 6.4  oz (111.3 kg)  Height: 5\' 6"  (1.676 m)    Wt Readings from Last 3 Encounters:  04/18/19 245 lb 6.4 oz (111.3 kg)  11/11/18 235 lb (106.6 kg)  09/09/18 220 lb (99.8 kg)     PHYSICAL EXAM General: NAD HEENT: Normal. Neck: No JVD, no thyromegaly. Lungs: Clear to auscultation bilaterally with normal respiratory effort. CV: Regular rate and rhythm, normal S1/S2, no S3/S4, no murmur. No pretibial or periankle edema.  No carotid bruit.   Abdomen: Soft, nontender, no distention.  Neurologic: Alert and oriented.  Psych: Normal affect. Skin: Normal. Musculoskeletal: No gross deformities.      Labs: Lab Results  Component Value Date/Time   K 4.6 02/07/2018 10:11 AM   K 4.1 02/23/2012 12:00 AM   BUN 13 02/07/2018 10:11 AM   CREATININE 1.29 (H) 02/07/2018 10:11 AM   CREATININE 1.09 10/12/2016 03:17 PM   ALT 8 04/16/2018 11:53 AM   TSH 1.750 02/07/2018 10:11 AM   HGB 12.5 04/16/2018 11:53 AM     Lipids: Lab Results  Component Value Date/Time   LDLCALC 108 (H) 02/07/2018 10:11 AM   LDLCALC 96 10/12/2016 03:17 PM   CHOL 172 02/07/2018 10:11 AM   TRIG 74 02/07/2018 10:11 AM   HDL 49 02/07/2018 10:11 AM       ASSESSMENT AND PLAN:  1.  Chest pain: Essentially normal nuclear stress test in October 2020.  No further cardiac testing is indicated.  Symptoms are atypical and noncardiac.  2.  Shortness of breath: I told her about the importance of tobacco cessation.  I recommended she contact her PCP about finding a new pulmonologist since Dr. Luan Pulling has retired.  3.  Tobacco use: She needs cessation.     Disposition: Follow up  prn   [name], M.D., F.A.C.C.

## 2019-04-30 ENCOUNTER — Ambulatory Visit (INDEPENDENT_AMBULATORY_CARE_PROVIDER_SITE_OTHER): Payer: 59 | Admitting: Gastroenterology

## 2019-04-30 ENCOUNTER — Other Ambulatory Visit: Payer: Self-pay

## 2019-04-30 ENCOUNTER — Other Ambulatory Visit: Payer: Self-pay | Admitting: *Deleted

## 2019-04-30 ENCOUNTER — Encounter: Payer: Self-pay | Admitting: Gastroenterology

## 2019-04-30 VITALS — BP 129/80 | HR 82 | Temp 97.1°F | Ht 66.0 in | Wt 243.6 lb

## 2019-04-30 DIAGNOSIS — K219 Gastro-esophageal reflux disease without esophagitis: Secondary | ICD-10-CM

## 2019-04-30 DIAGNOSIS — K5900 Constipation, unspecified: Secondary | ICD-10-CM

## 2019-04-30 DIAGNOSIS — E669 Obesity, unspecified: Secondary | ICD-10-CM

## 2019-04-30 NOTE — Patient Instructions (Signed)
We are referring you to a weight management center in [place] that is affiliated with [facility].  Start taking Linzess 1 capsule, 30 minutes before breakfast daily. Let me know how this works for you! If persistent pain, please call.  We will see you in 6 months!  I enjoyed seeing you again today! As you know, I value our relationship and want to provide genuine, compassionate, and quality care. I welcome your feedback. If you receive a survey regarding your visit,  I greatly appreciate you taking time to fill this out. See you next time!  Annitta Needs, PhD, ANP-BC [facility]

## 2019-04-30 NOTE — Progress Notes (Signed)
Primary Care Physician:  [facility] Primary GI: Dr. Gala Romney   Chief Complaint  Patient presents with  . Abdominal Pain    sharp pain right abd occ    HPI:   Jennifer [name] is a 49 y.o. female presenting today with a history of constipation and GERD, s/p hemorrhoid banding 2019.GES normal in the past. EGD and colonoscopy on file from July 2020.   Has had 1st COVID shot.   Protonix BID. No dysphagia. Had been on metformin but caused stomach pain but not diarrhea. Taking stool softener for constipation. Will have a pain in RUQ every so often that feels like a "sharp pain". Feels almost like gallstones but gallbladder is absent. Unable to pinpoint triggers. No nausea or vomiting. Sometimes feels constipated with this. Has been having hard stool. Feels unproductive like she still has some left.   Remembers a capsule that helped with constipation. If eats too much, will have urinary incontinence.   Wonders about what food to eat to help lose weight. Juices worsen reflux. Ground beef causes some cramping at times.   Past Medical History:  Diagnosis Date  . Allergy   . Anemia   . Anxiety   . Arthritis    neck  . Asthma   . Diabetes mellitus without complication ([place])   . GERD (gastroesophageal reflux disease)   . History of flexible sigmoidoscopy 1999   Normal to 60cm,  . Hypertension   . Neuromuscular disorder ([place])   . Ovarian cancer ([place])    skin cancer  . Panic attack   . PTSD (post-traumatic stress disorder)   . S/P endoscopy Feb 2012   Nl, s/p 56-French [facility] dilator, biopsy benign  . S/P endoscopy 2008   Mild gastritis  . Seizures ([place])   . Substance abuse ([place])    drug addiction    Past Surgical History:  Procedure Laterality Date  . ABDOMINAL HYSTERECTOMY     ovarian tumors  . CHOLECYSTECTOMY    . COLONOSCOPY  03/01/2011   Rourk-External hemorrhoids/otherwise normal rectum and colon.  . COLONOSCOPY WITH PROPOFOL N/A 08/08/2018   entire examined  colon is normal. The distal rectum and anal verge are normal on retroflexion view. Repeat in 5 years.   . ESOPHAGOGASTRODUODENOSCOPY  2008   gastritis  . ESOPHAGOGASTRODUODENOSCOPY  2012   Moderate sized hiatal hernia, non-H. pylori gastritis  . ESOPHAGOGASTRODUODENOSCOPY N/A 11/17/2013   RMR: Mild erosive reflux esophagitis. Patulous EG junction  . ESOPHAGOGASTRODUODENOSCOPY (EGD) WITH PROPOFOL N/A 08/08/2018   Erosive reflux esophagitis. Dilated. Small hiatal hernia. Otherwise normal.   . HERNIA REPAIR     Incisional with mesh  . MALONEY DILATION  08/08/2018   Procedure: MALONEY DILATION;  Surgeon: Daneil Dolin, MD;  [facility];  Service: Endoscopy;;  . PARTIAL HYSTERECTOMY    . SKIN CANCER EXCISION     left bicep  . TONSILLECTOMY      Current Outpatient Medications  Medication Sig Dispense Refill  . busPIRone (BUSPAR) 10 MG tablet Take 2 tablets (20 mg total) by mouth 3 (three) times daily. 168 tablet 2  . cetirizine (ZYRTEC) 10 MG tablet Take 1 tablet (10 mg total) by mouth every morning. 90 tablet 3  . DULoxetine (CYMBALTA) 60 MG capsule Take 1 capsule (60 mg total) by mouth daily. 28 capsule 2  . fluorometholone (FML) 0.1 % ophthalmic suspension Place 1 drop into both eyes 3 (three) times daily.    . fluticasone (FLONASE) 50 MCG/ACT  nasal spray Place 1 spray into both nostrils daily. 16 g 11  . lamoTRIgine (LAMICTAL) 150 MG tablet Take 1 tablet (150 mg total) by mouth daily. 28 tablet 2  . Levetiracetam 750 MG TB24 Take 3 tablets (2,250 mg total) by mouth at bedtime. 270 tablet 4  . meloxicam (MOBIC) 7.5 MG tablet TAKE (1) TABLET BY MOUTH ONCE DAILY. 30 tablet 0  . ondansetron (ZOFRAN) 4 MG tablet Take 1 tablet (4 mg total) by mouth every 8 (eight) hours as needed for nausea or vomiting. 60 tablet 1  . oxybutynin (DITROPAN) 5 MG tablet Take 1 tablet (5 mg total) by mouth 2 (two) times daily. 60 tablet 11  . pantoprazole (PROTONIX) 40 MG tablet Take 40 mg by mouth 2  (two) times daily.    . traZODone (DESYREL) 100 MG tablet TAKE 1/2 TO 1 TABLET AT BEDTIME AS NEEDED FOR SLEEP. 28 tablet 2  . Vitamin D, Ergocalciferol, (DRISDOL) 1.25 MG (50000 UT) CAPS capsule Take 1 capsule (50,000 Units total) by mouth every 7 (seven) days. 5 capsule 11   No current facility-administered medications for this visit.    Allergies as of 04/30/2019 - Review Complete 04/18/2019  Allergen Reaction Noted  . Metrizamide Rash and Swelling 10/17/2010  . Shellfish allergy Swelling 10/29/2013  . Amoxapine and related  07/12/2018  . Codeine Other (See Comments)   . Hydrocodone Itching 12/03/2012  . Ivp dye [iodinated diagnostic agents] Rash 10/17/2010  . Latex Rash 03/08/2010    Family History  Problem Relation Age of Onset  . Crohn's disease Maternal Aunt   . Breast cancer Maternal Grandmother   . Cancer Maternal Grandmother   . Colon cancer Maternal Grandfather        5  . Pancreatic cancer Maternal Grandfather   . Cancer Maternal Grandfather   . Crohn's disease Cousin        x 2 cousins  . Breast cancer Daughter 43  . Arthritis Mother   . COPD Mother   . Depression Mother   . Drug abuse Mother   . Heart disease Mother   . Hypertension Mother   . Hyperlipidemia Mother   . Learning disabilities Mother   . Mental illness Mother   . Alcohol abuse Mother   . Colon polyps Mother 6       tubular adenoma  . Early death Father 33       murder  . Alcohol abuse Father   . Drug abuse Father   . Learning disabilities Father   . Cancer Paternal Grandmother   . Alcohol abuse Paternal Grandmother   . Cancer Paternal Grandfather   . Alcohol abuse Maternal Uncle   . Alcohol abuse Paternal Uncle   . Drug abuse Paternal Uncle     Social History   Socioeconomic History  . Marital status: Married    Spouse name: [name]  . Number of children: Not on file  . Years of education: Not on file  . Highest education level: 11th grade  Occupational History  .  Occupation: disabled    Fish farm manager: NOT EMPLOYED  Tobacco Use  . Smoking status: Current Some Day Smoker    Packs/day: 1.50    Years: 26.00    Pack years: 39.00    Types: Cigarettes    Start date: 05/03/1984  . Smokeless tobacco: Never Used  . Tobacco comment: started back about 2 weeks ago   Substance and Sexual Activity  . Alcohol use: Not Currently    Alcohol/week:  1.0 standard drinks    Types: 1 Cans of beer per week    Comment: occasionally  . Drug use: No  . Sexual activity: Not Currently    Birth control/protection: Surgical  Other Topics Concern  . Not on file  Social History Narrative   Lives at home with her husband.   Right handed.    1-2 cups of coffee 3 times per wk.    Intermittent soda.   Social Determinants of Health   Financial Resource Strain:   . Difficulty of Paying Living Expenses:   Food Insecurity:   . Worried About Charity fundraiser in the Last Year:   . Arboriculturist in the Last Year:   Transportation Needs:   . Film/video editor (Medical):   Marland Kitchen Lack of Transportation (Non-Medical):   Physical Activity:   . Days of Exercise per Week:   . Minutes of Exercise per Session:   Stress:   . Feeling of Stress :   Social Connections:   . Frequency of Communication with Friends and Family:   . Frequency of Social Gatherings with Friends and Family:   . Attends Religious Services:   . Active Member of Clubs or Organizations:   . Attends Archivist Meetings:   Marland Kitchen Marital Status:     Review of Systems: Gen: Denies fever, chills, anorexia. Denies fatigue, weakness, weight loss.  CV: Denies chest pain, palpitations, syncope, peripheral edema, and claudication. Resp: Denies dyspnea at rest, cough, wheezing, coughing up blood, and pleurisy. GI: see HPI Derm: Denies rash, itching, dry skin Psych: Denies depression, anxiety, memory loss, confusion. No homicidal or suicidal ideation.  Heme: Denies bruising, bleeding, and enlarged lymph  nodes.  Physical Exam: BP 129/80   Pulse 82   Temp (!) 97.1 F (36.2 C) (Temporal)   Ht 5\' 6"  (1.676 m)   Wt 243 lb 9.6 oz (110.5 kg)   BMI 39.32 kg/m  General:   Alert and oriented. No distress noted. Pleasant and cooperative.  Head:  Normocephalic and atraumatic. Eyes:  Conjuctiva clear without scleral icterus. Mouth:  Mask in place  Abdomen:  +BS, soft, mild TTP to right of umbilicus and non-distended. No rebound or guarding. No HSM or masses noted. Msk:  Symmetrical without gross deformities. Normal posture. Extremities:  Without edema. Neurologic:  Alert and  oriented x4 Psych:  Alert and cooperative. Normal mood and affect.  ASSESSMENT: MISTYDAWN RENGIFO is a 49 y.o. female with history of chronic GERD, constipation, reporting intermittent right-sided abdominal pain. Gallbladder absent.  Abdominal pain: in setting of constipation not ideally controlled. No alarm signs/symptoms. Call if continues despite resuming bowel regimen. Start Linzess 145 mcg. Samples provided.  GERD: continue Protonix BID. Declining nutrition consultation.   Patient desires to lose weight; will refer her to weight management clinic through [facility].    PLAN:   Linzess 145 mcg daily  Referral to Weight Management through [facility] in [place]  Call if persistent discomfort despite bowel regimen  Continue Protonix BID  Return in 6 months   Annitta Needs, PhD, [facility]

## 2019-05-14 ENCOUNTER — Telehealth: Payer: Self-pay | Admitting: Obstetrics and Gynecology

## 2019-05-14 NOTE — Telephone Encounter (Signed)
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
## 2019-05-15 ENCOUNTER — Encounter: Payer: 59 | Admitting: Obstetrics and Gynecology

## 2019-05-19 ENCOUNTER — Ambulatory Visit (INDEPENDENT_AMBULATORY_CARE_PROVIDER_SITE_OTHER): Payer: 59 | Admitting: Psychiatry

## 2019-05-19 ENCOUNTER — Other Ambulatory Visit: Payer: Self-pay

## 2019-05-19 ENCOUNTER — Encounter (HOSPITAL_COMMUNITY): Payer: Self-pay | Admitting: Psychiatry

## 2019-05-19 DIAGNOSIS — F0630 Mood disorder due to known physiological condition, unspecified: Secondary | ICD-10-CM | POA: Diagnosis not present

## 2019-05-19 DIAGNOSIS — F4310 Post-traumatic stress disorder, unspecified: Secondary | ICD-10-CM | POA: Diagnosis not present

## 2019-05-19 MED ORDER — BUSPIRONE HCL 10 MG PO TABS: 20.0000 mg | ORAL_TABLET | Freq: Three times a day (TID) | ORAL | 2 refills | Status: DC

## 2019-05-19 MED ORDER — TRAZODONE HCL 100 MG PO TABS: ORAL_TABLET | ORAL | 2 refills | Status: DC

## 2019-05-19 MED ORDER — LAMOTRIGINE 150 MG PO TABS: 150.0000 mg | ORAL_TABLET | Freq: Every day | ORAL | 2 refills | Status: DC

## 2019-05-19 MED ORDER — DULOXETINE HCL 60 MG PO CPEP: 60.0000 mg | ORAL_CAPSULE | Freq: Every day | ORAL | 2 refills | Status: DC

## 2019-05-19 NOTE — Patient Instructions (Signed)
1. Continue duloxetine 60 mg daily  2. ContinueBuspar20 mg three times a day 3.Continuelamotrigine 150 mg daily  4.Continuetrazodone 100mg  at night as needed for sleep 5.Next appointment:7/5 at 1:40

## 2019-05-27 ENCOUNTER — Telehealth: Payer: Self-pay | Admitting: Obstetrics and Gynecology

## 2019-05-27 NOTE — Telephone Encounter (Signed)

## 2019-05-28 ENCOUNTER — Ambulatory Visit (INDEPENDENT_AMBULATORY_CARE_PROVIDER_SITE_OTHER): Payer: 59 | Admitting: Obstetrics and Gynecology

## 2019-05-28 ENCOUNTER — Encounter: Payer: Self-pay | Admitting: Obstetrics and Gynecology

## 2019-05-28 ENCOUNTER — Other Ambulatory Visit: Payer: Self-pay

## 2019-05-28 VITALS — BP 130/86 | HR 69 | Ht 66.0 in | Wt 239.4 lb

## 2019-05-28 DIAGNOSIS — N3946 Mixed incontinence: Secondary | ICD-10-CM

## 2019-05-28 MED ORDER — OXYBUTYNIN CHLORIDE ER 5 MG PO TB24: 5.0000 mg | ORAL_TABLET | Freq: Every day | ORAL | 3 refills | Status: DC

## 2019-05-28 NOTE — Progress Notes (Signed)
PATIENT ID: Jennifer [name], female     DOB: [birth year], 49 y.o.     MRN: [id number]    [facility] Visit  05/28/19           Patient name: Jennifer [name] MRN [id number]  Date of birth: 02-25-1970  CC & HPI:  Jennifer [name] is a 49 y.o. female presenting today to discuss bladder issues. She is accompanied by her spouse. She has some SUI, mostly Urge sx, voiding at night and sometimes cannot make it to bathroom without starting to void.   She has gotten her both of her Moderna COVID-19 vaccines and is fully vaccinated.   She recently had some skin removed due to skin cancer. There is a visible scar in place from removal.   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 has one functioning kidney,  This has caused  fluctuations in her blood pressure.  ROS:  Review of Systems  Constitutional: Negative for diaphoresis, fever, malaise/fatigue and weight loss.  HENT: Negative for congestion and sore throat.   Eyes: Negative for blurred vision and double vision.  Respiratory: Negative for cough and shortness of breath.   Cardiovascular: Negative for chest pain, palpitations and leg swelling.  Gastrointestinal: Negative for constipation, diarrhea, nausea and vomiting.  Genitourinary: Positive for urgency. Negative for frequency.       Incontinence, unimproved with Kegel exercises.   Musculoskeletal: Negative for back pain, falls and myalgias.  Skin: Negative for rash.  Neurological: Negative for dizziness, weakness and headaches.  Psychiatric/Behavioral: Negative for depression. The patient is not nervous/anxious.      Pertinent History Reviewed:  Reviewed: Significant for   Medical         Past Medical History:  Diagnosis Date  . Allergy   . Anemia   . Anxiety   . Arthritis    neck  . Asthma   . Diabetes mellitus without complication ([place])   . GERD (gastroesophageal reflux disease)   . History of flexible sigmoidoscopy 1999   Normal to 60cm,   . Hypertension   . Neuromuscular disorder ([place])   . Ovarian cancer ([place])    skin cancer  . Panic attack   . PTSD (post-traumatic stress disorder)   . S/P endoscopy Feb 2012   Nl, s/p [facility] dilator, biopsy benign  . S/P endoscopy 2008   Mild gastritis  . Seizures ([place])   . Substance abuse ([place])    drug addiction                              Surgical Hx:    Past Surgical History:  Procedure Laterality Date  . ABDOMINAL HYSTERECTOMY     ovarian tumors  . CHOLECYSTECTOMY    . COLONOSCOPY  03/01/2011   Rourk-External hemorrhoids/otherwise normal rectum and colon.  . COLONOSCOPY WITH PROPOFOL N/A 08/08/2018   entire examined colon is normal. The distal rectum and anal verge are normal on retroflexion view. Repeat in 5 years.   . ESOPHAGOGASTRODUODENOSCOPY  2008   gastritis  . ESOPHAGOGASTRODUODENOSCOPY  2012   Moderate sized hiatal hernia, non-H. pylori gastritis  . ESOPHAGOGASTRODUODENOSCOPY N/A 11/17/2013   RMR: Mild erosive reflux esophagitis. Patulous EG junction  . ESOPHAGOGASTRODUODENOSCOPY (EGD) WITH PROPOFOL N/A 08/08/2018   Erosive reflux esophagitis. Dilated. Small hiatal hernia. Otherwise normal.   . HERNIA REPAIR     Incisional with mesh  .  MALONEY DILATION  08/08/2018   Procedure: MALONEY DILATION;  Surgeon: Daneil Dolin, MD;  [facility];  Service: Endoscopy;;  . PARTIAL HYSTERECTOMY    . SKIN CANCER EXCISION     left bicep  . TONSILLECTOMY     Medications: Reviewed & Updated - see associated section                       Current Outpatient Medications:  .  busPIRone (BUSPAR) 10 MG tablet, Take 2 tablets (20 mg total) by mouth 3 (three) times daily., Disp: 168 tablet, Rfl: 2 .  cetirizine (ZYRTEC) 10 MG tablet, Take 1 tablet (10 mg total) by mouth every morning., Disp: 90 tablet, Rfl: 3 .  DULoxetine (CYMBALTA) 60 MG capsule, Take 1 capsule (60 mg total) by mouth daily., Disp: 28 capsule, Rfl: 2 .  fluorometholone (FML) 0.1 % ophthalmic  suspension, Place 1 drop into both eyes 3 (three) times daily., Disp: , Rfl:  .  fluticasone (FLONASE) 50 MCG/ACT nasal spray, Place 1 spray into both nostrils daily., Disp: 16 g, Rfl: 11 .  lamoTRIgine (LAMICTAL) 150 MG tablet, Take 1 tablet (150 mg total) by mouth daily., Disp: 28 tablet, Rfl: 2 .  Levetiracetam 750 MG TB24, Take 3 tablets (2,250 mg total) by mouth at bedtime., Disp: 270 tablet, Rfl: 4 .  meloxicam (MOBIC) 7.5 MG tablet, TAKE (1) TABLET BY MOUTH ONCE DAILY., Disp: 30 tablet, Rfl: 0 .  methocarbamol (ROBAXIN) 500 MG tablet, Take 500-1,000 mg by mouth 2 (two) times daily as needed., Disp: , Rfl:  .  nitroGLYCERIN (NITROSTAT) 0.4 MG SL tablet, Place 0.4 mg under the tongue every 5 (five) minutes as needed for chest pain., Disp: , Rfl:  .  ondansetron (ZOFRAN) 4 MG tablet, Take 1 tablet (4 mg total) by mouth every 8 (eight) hours as needed for nausea or vomiting., Disp: 60 tablet, Rfl: 1 .  oxybutynin (DITROPAN) 5 MG tablet, Take 1 tablet (5 mg total) by mouth 2 (two) times daily., Disp: 60 tablet, Rfl: 11 .  pantoprazole (PROTONIX) 40 MG tablet, Take 40 mg by mouth 2 (two) times daily., Disp: , Rfl:  .  traZODone (DESYREL) 100 MG tablet, TAKE 1/2 TO 1 TABLET AT BEDTIME AS NEEDED FOR SLEEP., Disp: 28 tablet, Rfl: 2 .  Vitamin D, Ergocalciferol, (DRISDOL) 1.25 MG (50000 UT) CAPS capsule, Take 1 capsule (50,000 Units total) by mouth every 7 (seven) days., Disp: 5 capsule, Rfl: 11   Social History: Reviewed -  reports that she has been smoking cigarettes. She started smoking about 35 years ago. She has a 39.00 pack-year smoking history. She has never used smokeless tobacco.  Objective Findings:  Vitals: There were no vitals taken for this visit. BP 130/86 (BP Location: Right Arm, Patient Position: Sitting, Cuff Size: Normal)   Pulse 69   Ht 5\' 6"  (1.676 m)   Wt 239 lb 6.4 oz (108.6 kg)   BMI 38.64 kg/m   PHYSICAL EXAMINATION General appearance - alert, well appearing, and in no  distress, oriented to person, place, and time, overweight and dehydrated Mental status - alert, oriented to person, place, and time, normal mood, behavior, speech, dress, motor activity, and thought processes, affect appropriate to mood Chest - not examined Heart - not examined Abdomen - not examined Breasts - breasts appear normal, no suspicious masses, no skin or nipple changes or axillary nodes Skin - normal coloration and turgor, no rashes, no suspicious skin lesions noted  PELVIC External genitalia - normal obese Vulva - no lesions Vagina - support good Cervix -surgically absent} Uterus - surgically absent} Adnexa - difficult exam [place] - n/a Rectal - , rectal exam not indicated    Assessment & Plan:   A:  1.  Urge incontinence 2. Mild SUI 3. obesity  P:  1. Rx Urge Incontinence 5 mg BID 2. Recommended vaginal cream of pill to treat vaginal dryness 3 . Effectiveness of weight loss reviewed. 4.  F.u by televisit  By signing my name below, I, [employer], attest that this documentation has been prepared under the direction and in the presence of Jonnie Kind, MD. Electronically Signed: [place]. 05/28/19. 1:57 PM.  I personally performed the services described in this documentation, which was SCRIBED in my presence. The recorded information has been reviewed and considered accurate. It has been edited as necessary during review. Jonnie Kind, MD

## 2019-05-30 DIAGNOSIS — N3946 Mixed incontinence: Secondary | ICD-10-CM | POA: Insufficient documentation

## 2019-06-03 ENCOUNTER — Telehealth: Payer: Self-pay | Admitting: Obstetrics and Gynecology

## 2019-06-03 NOTE — Telephone Encounter (Signed)
Telephoned patient at home number and patient states Dr. Glo Herring was going to call in some vaginal cream for dryness. Advised patient would possibly be next week when medication called in. Patient voiced understanding.

## 2019-06-03 NOTE — Telephone Encounter (Signed)
Pt states that the pharmacy stated another dr already prescribed her oxybutynin But she states that Glo Herring was going to change the dosage. She would like clarification. Also pt would like the estrogen cream Glo Herring mentioned to be sent in to the pharmacy.

## 2019-06-04 ENCOUNTER — Telehealth: Payer: Self-pay | Admitting: Obstetrics and Gynecology

## 2019-06-04 MED ORDER — PREMARIN 0.625 MG/GM VA CREA: 0.5000 g | TOPICAL_CREAM | VAGINAL | 2 refills | Status: DC

## 2019-06-04 NOTE — Telephone Encounter (Signed)
Premarin Vag cream Rx sent to pharmacy. Will ask office to supply 4 gm sample tubes x 2 to patient to test patient tolerance of medication.  Will need to discuss with Dr Bronson Ing before considering systemic oral estrogen

## 2019-06-17 ENCOUNTER — Telehealth: Payer: Self-pay | Admitting: Obstetrics and Gynecology

## 2019-06-17 NOTE — Telephone Encounter (Signed)
Pt states that the conjugated estrogens (PREMARIN) vaginal cream Needs prior auth with her insurance.

## 2019-07-09 ENCOUNTER — Telehealth: Payer: Self-pay | Admitting: Obstetrics and Gynecology

## 2019-07-09 ENCOUNTER — Ambulatory Visit: Payer: 59 | Admitting: Obstetrics and Gynecology

## 2019-07-09 NOTE — Telephone Encounter (Signed)
Pt's Premarin requires a PA according to chart. I advised pt to have pharmacy send [place] the PA form. Pt voiced understanding. [place]

## 2019-07-09 NOTE — Telephone Encounter (Signed)
Left message @ 9:54 am. JSY

## 2019-07-09 NOTE — Telephone Encounter (Signed)
Pt was very confusing / she wants to cancel her appt today said she has not taken the meds that she was suppose to take because she has been waiting on [place] to call her back, Something about ins needing more proof before they would pay. Please call pt and advise of the status of the med request

## 2019-07-24 ENCOUNTER — Telehealth: Payer: Self-pay | Admitting: *Deleted

## 2019-07-24 ENCOUNTER — Telehealth: Payer: Self-pay | Admitting: Obstetrics and Gynecology

## 2019-07-24 NOTE — Telephone Encounter (Signed)
Patient called stating that she would like a call back from the nurse, pt states that she has not been able to get her medication. Pt states that [facility] need prior auth. Please contact pt

## 2019-07-24 NOTE — Telephone Encounter (Signed)
Patient states she has still not received the Premarin cream as her insurance will not cover it and PA needs to be completed.  Informed patient PA has been denied.  States she used the same that was given to her in April and feels like it worked well for her.  Informed patient we have been out of Premarin samples but now have some back in.  Will leave 2 boxes for her at the front desk.  Also reiterated that she is only to use the cream 3 times a week.

## 2019-07-24 NOTE — Telephone Encounter (Signed)

## 2019-07-25 ENCOUNTER — Ambulatory Visit: Payer: 59 | Admitting: Obstetrics and Gynecology

## 2019-07-30 ENCOUNTER — Ambulatory Visit: Payer: 59

## 2019-07-30 ENCOUNTER — Other Ambulatory Visit: Payer: Self-pay

## 2019-07-30 ENCOUNTER — Ambulatory Visit (INDEPENDENT_AMBULATORY_CARE_PROVIDER_SITE_OTHER): Payer: 59 | Admitting: Orthopedic Surgery

## 2019-07-30 VITALS — BP 129/85 | HR 76 | Ht 66.0 in | Wt 236.0 lb

## 2019-07-30 DIAGNOSIS — M25561 Pain in right knee: Secondary | ICD-10-CM

## 2019-07-30 DIAGNOSIS — G8929 Other chronic pain: Secondary | ICD-10-CM | POA: Diagnosis not present

## 2019-07-30 NOTE — Progress Notes (Signed)
NEW PROBLEM//OFFICE VISIT  Chief Complaint  Patient presents with  . Knee Pain    Right knee pain, referred by A. Powell.    49 year old female presents for evaluation of her right knee.  Patient started having pain in the right knee at the beginning of the pandemic last year complains of medial lateral peripatellar pain medial joint line pain which is exacerbated by pivoting and turning on the right knee.  She describes a sharp nonradiating pain 8 out of 10 no prior treatment   Review of Systems  Gastrointestinal: Positive for heartburn.  Neurological: Positive for seizures.  Endo/Heme/Allergies: Positive for environmental allergies. Bruises/bleeds easily.     Past Medical History:  Diagnosis Date  . Allergy   . Anemia   . Anxiety   . Arthritis    neck  . Asthma   . Diabetes mellitus without complication ([place])   . GERD (gastroesophageal reflux disease)   . History of flexible sigmoidoscopy 1999   Normal to 60cm,  . Hypertension   . Neuromuscular disorder ([place])   . Ovarian cancer ([place])    skin cancer  . Panic attack   . PTSD (post-traumatic stress disorder)   . S/P endoscopy Feb 2012   Nl, s/p 56-French [facility] dilator, biopsy benign  . S/P endoscopy 2008   Mild gastritis  . Seizures ([place])   . Substance abuse ([place])    drug addiction    Past Surgical History:  Procedure Laterality Date  . ABDOMINAL HYSTERECTOMY     ovarian tumors  . CHOLECYSTECTOMY    . COLONOSCOPY  03/01/2011   Rourk-External hemorrhoids/otherwise normal rectum and colon.  . COLONOSCOPY WITH PROPOFOL N/A 08/08/2018   entire examined colon is normal. The distal rectum and anal verge are normal on retroflexion view. Repeat in 5 years.   . ESOPHAGOGASTRODUODENOSCOPY  2008   gastritis  . ESOPHAGOGASTRODUODENOSCOPY  2012   Moderate sized hiatal hernia, non-H. pylori gastritis  . ESOPHAGOGASTRODUODENOSCOPY N/A 11/17/2013   RMR: Mild erosive reflux esophagitis. Patulous EG junction  .  ESOPHAGOGASTRODUODENOSCOPY (EGD) WITH PROPOFOL N/A 08/08/2018   Erosive reflux esophagitis. Dilated. Small hiatal hernia. Otherwise normal.   . HERNIA REPAIR     Incisional with mesh  . MALONEY DILATION  08/08/2018   Procedure: MALONEY DILATION;  Surgeon: Daneil Dolin, MD;  [facility];  Service: Endoscopy;;  . PARTIAL HYSTERECTOMY    . SKIN CANCER EXCISION     left bicep  . TONSILLECTOMY      Family History  Problem Relation Age of Onset  . Crohn's disease Maternal Aunt   . Breast cancer Maternal Grandmother   . Cancer Maternal Grandmother   . Colon cancer Maternal Grandfather        48  . Pancreatic cancer Maternal Grandfather   . Cancer Maternal Grandfather   . Crohn's disease Cousin        x 2 cousins  . Breast cancer Daughter 13  . Arthritis Mother   . COPD Mother   . Depression Mother   . Drug abuse Mother   . Heart disease Mother   . Hypertension Mother   . Hyperlipidemia Mother   . Learning disabilities Mother   . Mental illness Mother   . Alcohol abuse Mother   . Colon polyps Mother 48       tubular adenoma  . Early death Father 67       murder  . Alcohol abuse Father   . Drug abuse Father   .  Learning disabilities Father   . Cancer Paternal Grandmother   . Alcohol abuse Paternal Grandmother   . Cancer Paternal Grandfather   . Alcohol abuse Maternal Uncle   . Alcohol abuse Paternal Uncle   . Drug abuse Paternal Uncle    Social History   Tobacco Use  . Smoking status: Current Some Day Smoker    Packs/day: 1.50    Years: 26.00    Pack years: 39.00    Types: Cigarettes    Start date: 05/03/1984  . Smokeless tobacco: Never Used  . Tobacco comment: started back about 2 weeks ago   Vaping Use  . Vaping Use: Never used  Substance Use Topics  . Alcohol use: Yes    Comment: occassionally  . Drug use: Yes    Types: Marijuana    Allergies  Allergen Reactions  . Metrizamide Rash and Swelling    Hands swelling/rash  . Shellfish Allergy  Swelling    Swells tongue  . Amoxapine And Related     Nausea   . Codeine Other (See Comments)    itching  . Hydrocodone Itching    Rash and itching  . Ivp Dye [Iodinated Diagnostic Agents] Rash    Hands swelling/rash  . Latex Rash    Current Meds  Medication Sig  . busPIRone (BUSPAR) 10 MG tablet Take 2 tablets (20 mg total) by mouth 3 (three) times daily.  . cetirizine (ZYRTEC) 10 MG tablet Take 1 tablet (10 mg total) by mouth every morning.  . conjugated estrogens (PREMARIN) vaginal cream Place 8.41 Applicatorfuls vaginally 3 (three) times a week. For vaginal thinning and irritation, dryness  . DULoxetine (CYMBALTA) 60 MG capsule Take 1 capsule (60 mg total) by mouth daily.  . fluorometholone (FML) 0.1 % ophthalmic suspension Place 1 drop into both eyes 3 (three) times daily.  . fluticasone (FLONASE) 50 MCG/ACT nasal spray Place 1 spray into both nostrils daily.  Marland Kitchen lamoTRIgine (LAMICTAL) 150 MG tablet Take 1 tablet (150 mg total) by mouth daily.  . Levetiracetam 750 MG TB24 Take 3 tablets (2,250 mg total) by mouth at bedtime.  Marland Kitchen linaclotide (LINZESS) 145 MCG CAPS capsule Take 145 mcg by mouth daily before breakfast.  . meloxicam (MOBIC) 7.5 MG tablet TAKE (1) TABLET BY MOUTH ONCE DAILY.  . methocarbamol (ROBAXIN) 500 MG tablet Take 500-1,000 mg by mouth 2 (two) times daily as needed.  . nitroGLYCERIN (NITROSTAT) 0.4 MG SL tablet Place 0.4 mg under the tongue every 5 (five) minutes as needed for chest pain.  Marland Kitchen ondansetron (ZOFRAN) 4 MG tablet Take 1 tablet (4 mg total) by mouth every 8 (eight) hours as needed for nausea or vomiting.  Marland Kitchen oxybutynin (DITROPAN-XL) 5 MG 24 hr tablet Take 1 tablet (5 mg total) by mouth at bedtime.  . pantoprazole (PROTONIX) 40 MG tablet Take 40 mg by mouth 2 (two) times daily.  . traMADol (ULTRAM) 50 MG tablet Take by mouth every 6 (six) hours as needed.  . traZODone (DESYREL) 100 MG tablet TAKE 1/2 TO 1 TABLET AT BEDTIME AS NEEDED FOR SLEEP.  . Vitamin  D, Ergocalciferol, (DRISDOL) 1.25 MG (50000 UT) CAPS capsule Take 1 capsule (50,000 Units total) by mouth every 7 (seven) days.    BP 129/85   Pulse 76   Ht 5\' 6"  (1.676 m)   Wt 236 lb (107 kg)   BMI 38.09 kg/m   Physical Exam Vitals and nursing note reviewed.  Constitutional:      Appearance: Normal appearance.  Neurological:  Mental Status: She is alert and oriented to person, place, and time.  Psychiatric:        Mood and Affect: Mood normal.     Ortho Exam Left knee nontender full range of motion no instability normal muscle tone skin intact 2 portals from previous arthroscopy neurovascular exam normal  Right knee skin normal.  Neurovascularis intact.  Tenderness on the medial lateral side of the patella along with medial joint line tenderness small effusion pain with extension normal flexion normal ligaments normal muscle tone   MEDICAL DECISION MAKING  A.  Encounter Diagnosis  Name Primary?  . Chronic pain of right knee Yes    X-rays in the office show severe narrowing of the medial compartment no secondary bone changes however when you look at the lateral the joint spaces look normal so I think the knee was flexed on the AP  Talus centered   C. MANAGEMENT  Recommend physical therapy Ibuprofen 4-week follow-up consider MRI if no improvement  Encounter Diagnosis  Name Primary?  . Chronic pain of right knee Yes      No orders of the defined types were placed in this encounter.     Arther Abbott, MD  07/30/2019 11:52 AM
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
## 2019-08-11 ENCOUNTER — Ambulatory Visit (HOSPITAL_COMMUNITY): Payer: 59 | Admitting: Psychiatry

## 2019-08-14 ENCOUNTER — Other Ambulatory Visit: Payer: Self-pay

## 2019-08-14 ENCOUNTER — Telehealth (INDEPENDENT_AMBULATORY_CARE_PROVIDER_SITE_OTHER): Payer: 59 | Admitting: Psychiatry

## 2019-08-14 ENCOUNTER — Encounter (HOSPITAL_COMMUNITY): Payer: Self-pay | Admitting: Psychiatry

## 2019-08-14 DIAGNOSIS — F0630 Mood disorder due to known physiological condition, unspecified: Secondary | ICD-10-CM | POA: Diagnosis not present

## 2019-08-14 DIAGNOSIS — F4310 Post-traumatic stress disorder, unspecified: Secondary | ICD-10-CM | POA: Diagnosis not present

## 2019-08-14 MED ORDER — BUSPIRONE HCL 10 MG PO TABS: 20.0000 mg | ORAL_TABLET | Freq: Three times a day (TID) | ORAL | 2 refills | Status: DC

## 2019-08-14 MED ORDER — DULOXETINE HCL 60 MG PO CPEP: 60.0000 mg | ORAL_CAPSULE | Freq: Every day | ORAL | 2 refills | Status: DC

## 2019-08-14 MED ORDER — TRAZODONE HCL 100 MG PO TABS: ORAL_TABLET | ORAL | 2 refills | Status: DC

## 2019-08-14 MED ORDER — LAMOTRIGINE 150 MG PO TABS: 150.0000 mg | ORAL_TABLET | Freq: Every day | ORAL | 2 refills | Status: DC

## 2019-08-20 ENCOUNTER — Ambulatory Visit (HOSPITAL_COMMUNITY): Payer: 59 | Admitting: Physical Therapy

## 2019-09-04 ENCOUNTER — Other Ambulatory Visit: Payer: Self-pay

## 2019-09-04 ENCOUNTER — Telehealth: Payer: Self-pay | Admitting: Orthopedic Surgery

## 2019-09-04 ENCOUNTER — Ambulatory Visit (HOSPITAL_COMMUNITY): Payer: 59 | Attending: Orthopedic Surgery | Admitting: Physical Therapy

## 2019-09-04 DIAGNOSIS — R262 Difficulty in walking, not elsewhere classified: Secondary | ICD-10-CM | POA: Diagnosis present

## 2019-09-04 DIAGNOSIS — M6281 Muscle weakness (generalized): Secondary | ICD-10-CM | POA: Insufficient documentation

## 2019-09-04 DIAGNOSIS — M25561 Pain in right knee: Secondary | ICD-10-CM | POA: Insufficient documentation

## 2019-09-04 NOTE — Therapy (Signed)
[place], [place], [address] Phone: [phone]   Fax:  [phone]  Physical Therapy Evaluation  Patient Details  Name: Jennifer [name] MRN: [id number] Date of Birth: 1970/05/07 Referring Provider (PT): Arther Abbott    Encounter Date: 09/04/2019   PT End of Session - 09/04/19 1454    Visit Number 1    Number of Visits 6    Date for PT Re-Evaluation 10/16/19    Authorization Type [employer]/medicaid    PT Start Time 1405    PT Stop Time 1450    PT Time Calculation (min) 45 min    Activity Tolerance Patient limited by pain    Behavior During Therapy [facility] for tasks assessed/performed           Past Medical History:  Diagnosis Date  . Allergy   . Anemia   . Anxiety   . Arthritis    neck  . Asthma   . Diabetes mellitus without complication ([place])   . GERD (gastroesophageal reflux disease)   . History of flexible sigmoidoscopy 1999   Normal to 60cm,  . Hypertension   . Neuromuscular disorder ([place])   . Ovarian cancer ([place])    skin cancer  . Panic attack   . PTSD (post-traumatic stress disorder)   . S/P endoscopy Feb 2012   Nl, s/p 56-[facility] dilator, biopsy benign  . S/P endoscopy 2008   Mild gastritis  . Seizures ([place])   . Substance abuse ([place])    drug addiction    Past Surgical History:  Procedure Laterality Date  . ABDOMINAL HYSTERECTOMY     ovarian tumors  . CHOLECYSTECTOMY    . COLONOSCOPY  03/01/2011   [place]-External hemorrhoids/otherwise normal rectum and colon.  . COLONOSCOPY WITH PROPOFOL N/A 08/08/2018   entire examined colon is normal. The distal rectum and anal verge are normal on retroflexion view. Repeat in 5 years.   . ESOPHAGOGASTRODUODENOSCOPY  2008   gastritis  . ESOPHAGOGASTRODUODENOSCOPY  2012   Moderate sized hiatal hernia, non-H. pylori gastritis  . ESOPHAGOGASTRODUODENOSCOPY N/A 11/17/2013   RMR: Mild erosive reflux esophagitis. Patulous EG junction  .  ESOPHAGOGASTRODUODENOSCOPY (EGD) WITH PROPOFOL N/A 08/08/2018   Erosive reflux esophagitis. Dilated. Small hiatal hernia. Otherwise normal.   . HERNIA REPAIR     Incisional with mesh  . MALONEY DILATION  08/08/2018   Procedure: MALONEY DILATION;  Surgeon: Daneil Dolin, MD;  Location: [facility] ENDO SUITE;  Service: Endoscopy;;  . PARTIAL HYSTERECTOMY    . SKIN CANCER EXCISION     left bicep  . TONSILLECTOMY      There were no vitals filed for this visit.    Subjective Assessment - 09/04/19 1408    Subjective PT states that she had surgery on her Lt leg and was using her Rt LE more than she should have.  She began having increased pain but it got to a point where it was affecting her ADL's in June.  She is having most her pain both anteriorly and medially.  She has tried cold packs, and marching to try and relieve her pain.    Pertinent History HTM, OA, BAck pain , surgery on LT to remove a bone spur.    How long can you sit comfortably? no problem    How long can you stand comfortably? not long; unable to brush her teeth standing up.    How long can you walk comfortably? walks with a cane for  about 50 ft.    Patient Stated Goals Wants the pain to go away    Currently in Pain? Yes   least pain 8/10; worst 13   Pain Score 8     Pain Location Knee    Pain Orientation Right    Pain Descriptors / Indicators Aching;Stabbing    Pain Type Chronic pain    Pain Onset 1 to 4 weeks ago    Pain Frequency Constant    Aggravating Factors  standing    Pain Relieving Factors ice    Effect of Pain on Daily Activities limits              [facility] PT Assessment - 09/04/19 0001      Assessment   Medical Diagnosis Rt knee pain     Referring Provider (PT) Arther Abbott     Onset Date/Surgical Date 07/08/19    Hand Dominance Right    Prior Therapy not for this condition       Precautions   Precautions None      Restrictions   Weight Bearing Restrictions No      Balance Screen   Has the patient  fallen in the past 6 months Yes    How many times? 1    Has the patient had a decrease in activity level because of a fear of falling?  Yes    Is the patient reluctant to leave their home because of a fear of falling?  No      Home Environment   Living Environment Private residence    Living Arrangements Spouse/significant other    Type of [place] to enter    Entrance Stairs-Number of Steps 3    Entrance Stairs-[place] One level    [place] - single point      Prior Function   Level of [place] with household mobility with device      Cognition   Overall Cognitive Status Within Functional Limits for tasks assessed      Observation/Other Assessments   Focus on Therapeutic Outcomes (FOTO)  21; 79 % affected      Functional Tests   Functional tests Single leg stance;Sit to Stand      Sit to Stand   Comments unable without using her UE       ROM / Strength   AROM / PROM / Strength AROM      AROM   AROM Assessment Site Knee    Right/Left Knee Right    Right Knee Extension 0    Right Knee Flexion 95      Strength   Strength Assessment Site Hip;Knee;Ankle    Right/Left Hip Right;Left    Right Hip Flexion 3-/5    Right Hip Extension 3-/5    Right Hip ABduction 3-/5    Right Knee Flexion 3-/5    Right Knee Extension 3/5   verbal grunting of pain.    Right Ankle Dorsiflexion 3-/5    Right Ankle Plantar Flexion --   NT; sitting pt asked to raise heel, grabs gastroc and "OH,OH                     Objective measurements completed on examination: See above findings.       [facility] Adult PT Treatment/Exercise - 09/04/19 0001      Exercises   Exercises Knee/Hip      Knee/Hip Exercises:  Seated   Long Arc Quad Right;5 reps    Other Seated Knee/Hip Exercises ankle pumps       Knee/Hip Exercises: Supine   Quad Sets Right;5 reps    Straight Leg Raises Strengthening;Right;5 reps                    PT Education - 09/04/19 1453    Education provided Yes    Education Details HEP    Person(s) Educated Patient    Methods Explanation;Handout    Comprehension Returned demonstration            PT Short Term Goals - 09/04/19 1503      PT SHORT TERM GOAL #1   Title PT I in HEP to improve knee flexion to 110 to improve ease of getting into and out of a car.    Time 3    Period Weeks    Status New    Target Date 09/25/19      PT SHORT TERM GOAL #2   Title Pt pain to be no greater than a 7/10 to allow pt to brush her teeth while sitting.    Time 3    Period Weeks      PT SHORT TERM GOAL #3   Title PT to be able to tolerate walking with her cane for 100 ft. without stopping    Time 3    Period Weeks    Status New             PT Long Term Goals - 09/04/19 1505      PT LONG TERM GOAL #1   Title Pt to be I in an advance HEP to promote strengthening of her core and LE mm to allow her to go up and down 3 steps in a reciprocal manner.    Time 6    Period Weeks    Status New    Target Date 10/16/19      PT LONG TERM GOAL #2   Title PT pain to be no greater than a 5/10 to allow pt to be able to stand for 15 minutes to be able to prep food for meals.    Time 6    Period Weeks    Status New      PT LONG TERM GOAL #3   Title PT to be able to walk with her cane for over 300 ft without resting for improved mobility to be able to go to MD visits.    Time 6    Period Weeks    Status New                  Plan - 09/04/19 1456    Clinical Impression Statement Ms. Fussner is a 49 yo female who has been referred to skilled outpatient therapy for Rt knee pain.  She states that she is here due to her insurance requiring her to be here and she is convinced that she will need surgery on her knee as this is the exact same way her left knee was feeling and the only thing that helped was surgery.  She verbalizes pain with all motions and all exercises.   Evaluation demonstrates increased pain, decreased ROM, decreased strength and antalgic gait.  Ms. [place]ot[place] will benefit from skilled PT to address these deficits and maximize her functioning ability.    Personal Factors and Comorbidities Behavior Pattern;Comorbidity 3+    Comorbidities OA, HTN, back pain, previous surgeries.  Examination-Activity Limitations Bathing;Bend;Caring for Others;Carry;Dressing;Hygiene/Grooming;Lift;Locomotion Level;Squat;Sleep;Stairs;Stand;Toileting;Transfers    Examination-Participation Restrictions Church;Cleaning;Community Activity;Driving;Laundry;Meal Prep;Occupation;Yard Work;Shop    Stability/Clinical Decision Making Evolving/Moderate complexity    Clinical Decision Making Moderate    Rehab Potential Fair    PT Frequency 1x / week    PT Duration 6 weeks    PT Treatment/Interventions ADLs/Self Care Home Management;DME Instruction;Gait training;Stair training;Functional mobility training;Therapeutic activities;Therapeutic exercise;Balance training;Neuromuscular re-education;Patient/family education;Manual techniques;Passive range of motion    PT Next Visit Plan Focus on improving ROM and strength.    PT Home Exercise Plan sitting ankle pump, LAQ, supine quad set, heelslide as SLR           Patient will benefit from skilled therapeutic intervention in order to improve the following deficits and impairments:  Abnormal gait, Decreased activity tolerance, Decreased range of motion, Decreased strength, Difficulty walking, Pain, Obesity  Visit Diagnosis: Acute pain of right knee - Plan: PT plan of care cert/re-cert  Difficulty in walking, not elsewhere classified - Plan: PT plan of care cert/re-cert  Muscle weakness (generalized) - Plan: PT plan of care cert/re-cert     Problem List Patient Active Problem List   Diagnosis Date Noted  . Urge and stress incontinence 05/30/2019  . Regurgitation of food 09/08/2018  . Early satiety 09/08/2018  . Odynophagia  09/08/2018  . Cervicalgia 07/24/2018  . Family history of colonic polyps 04/12/2018  . LUQ abdominal pain 04/10/2018  . Chronic pain of left knee 03/22/2018  . Memory loss 03/22/2018  . Joint pain 02/07/2018  . Grade II hemorrhoids 01/18/2018  . Grade III hemorrhoids 12/06/2017  . Rib pain on left side 08/23/2017  . Abdominal pain, epigastric 06/06/2017  . Rectal bleeding 06/06/2017  . Rectal pain 06/06/2017  . PTSD (post-traumatic stress disorder) 05/31/2017  . Mood disorder in conditions classified elsewhere 05/31/2017  . History of deep vein thrombosis (DVT) of lower extremity 12/05/2016  . Tobacco abuse 10/12/2016  . Asthma in adult, mild intermittent, uncomplicated 29/47/6546  . SUI (stress urinary incontinence, female) 10/12/2016  . Chronic mental illness 10/12/2016  . Hiatal hernia 08/20/2012  . Obesity 11/22/2011  . HTN (hypertension) 09/21/2011  . Chondromalacia of left patella 05/23/2011  . ANEMIA 03/09/2009  . GASTROESOPHAGEAL REFLUX DISEASE, CHRONIC 03/09/2009  . Constipation 03/09/2009  . Seizure disorder [facility]) 03/09/2009    Rayetta Humphrey, PT CLT [phone] 09/04/2019, 3:14 PM  [place] [address] [place], [place], [address] Phone: [phone]   Fax:  [phone]  Name: Jennifer [name] MRN: [id number] Date of Birth: 30-Dec-1970

## 2019-09-04 NOTE — Telephone Encounter (Signed)
I called patient/ advised to d/c physical therapy since she can not tolerate it due to pain  Told her to follow up with Dr Aline Brochure She needs 4 week follow up per last office note   To Dr Waynette Buttery To Arbie Cookey for appointment

## 2019-09-04 NOTE — Telephone Encounter (Signed)
Already scheduled for appointment from last visit, 09/12/19/ aware.

## 2019-09-04 NOTE — Telephone Encounter (Signed)
Patient called following physical therapy at [facility]. States she does not think she can continue therapy; asked to speak with Dr Ruthe Mannan nurse/clinical assistant.  Also mentioned concern about swelling of knees and ankles - said she had seen her primary care, [name], [place], and was advised to relay to Dr Aline Brochure. Please call patient [phone]

## 2019-09-05 ENCOUNTER — Telehealth (HOSPITAL_COMMUNITY): Payer: Self-pay | Admitting: Physical Therapy

## 2019-09-05 NOTE — Telephone Encounter (Signed)
pt called to cx all of her aqppts she wants to be discharged

## 2019-09-08 ENCOUNTER — Telehealth (HOSPITAL_COMMUNITY): Payer: Self-pay | Admitting: Physical Therapy

## 2019-09-08 NOTE — Telephone Encounter (Signed)
pt states she no longer has to take therapy. she cancelled the remainder of her appts

## 2019-09-12 ENCOUNTER — Encounter: Payer: Self-pay | Admitting: Orthopedic Surgery

## 2019-09-12 ENCOUNTER — Ambulatory Visit: Payer: 59

## 2019-09-12 ENCOUNTER — Ambulatory Visit (INDEPENDENT_AMBULATORY_CARE_PROVIDER_SITE_OTHER): Payer: 59 | Admitting: Orthopedic Surgery

## 2019-09-12 ENCOUNTER — Other Ambulatory Visit (HOSPITAL_COMMUNITY)
Admission: RE | Admit: 2019-09-12 | Discharge: 2019-09-12 | Disposition: A | Discharge status: Home / Self Care | Payer: 59 | Source: Ambulatory Visit | Attending: Orthopedic Surgery | Admitting: Orthopedic Surgery

## 2019-09-12 ENCOUNTER — Other Ambulatory Visit: Payer: Self-pay

## 2019-09-12 VITALS — BP 134/97 | HR 71 | Ht 66.0 in | Wt 229.0 lb

## 2019-09-12 DIAGNOSIS — M25561 Pain in right knee: Secondary | ICD-10-CM

## 2019-09-12 DIAGNOSIS — M25562 Pain in left knee: Secondary | ICD-10-CM | POA: Diagnosis present

## 2019-09-12 DIAGNOSIS — M7632 Iliotibial band syndrome, left leg: Secondary | ICD-10-CM | POA: Diagnosis not present

## 2019-09-12 DIAGNOSIS — M1990 Unspecified osteoarthritis, unspecified site: Secondary | ICD-10-CM

## 2019-09-12 DIAGNOSIS — G8929 Other chronic pain: Secondary | ICD-10-CM

## 2019-09-12 DIAGNOSIS — M2242 Chondromalacia patellae, left knee: Secondary | ICD-10-CM

## 2019-09-12 LAB — SEDIMENTATION RATE: Sed Rate: 20 mm/hr (ref 0–22)

## 2019-09-12 LAB — C-REACTIVE PROTEIN: CRP: 1.9 mg/dL — ABNORMAL HIGH (ref <1.0)

## 2019-09-12 NOTE — Patient Instructions (Addendum)
We will send your Rheumatology referral to [facility] the phone number to schedule is [phone] you can call to schedule appointment.   MRI RIGHT KNEE   LABS AT [facility]   ICE THE KNEE   ELEVATED THE LEG

## 2019-09-12 NOTE — Progress Notes (Signed)
  Chief Complaint  Patient presents with  . Knee Pain    bilateral knee pain, spots on legs, had blood work done at PCP. Marland Kitchen some swelling, feels like pins and needles in feet. left knee just started hurting worse as well.     49 year old female who is being treated for right knee pain. Thought to have patellofemoral disease was sent for physical therapy presents back with continued pain and catching in her right knee  New onset symptoms left leg including left leg swelling left knee pain locking catching pain over the anterolateral tibia  The patient was seen by primary care she had a CBC which showed she had a normal white count 11.8 hemoglobin low MCV MCH MCHC normal platelets. The patient says she had some unexplained bruising of the left lower extremity and is in the process of being worked up for that.  Note she only has one kidney  She is currently on Ultram and meloxicam  Past Medical History:  Diagnosis Date  . Allergy   . Anemia   . Anxiety   . Arthritis    neck  . Asthma   . Diabetes mellitus without complication ([place])   . GERD (gastroesophageal reflux disease)   . History of flexible sigmoidoscopy 1999   Normal to 60cm,  . Hypertension   . Neuromuscular disorder ([place])   . Ovarian cancer ([place])    skin cancer  . Panic attack   . PTSD (post-traumatic stress disorder)   . S/P endoscopy Feb 2012   Nl, s/p 56-[facility] dilator, biopsy benign  . S/P endoscopy 2008   Mild gastritis  . Seizures ([place])   . Substance abuse ([place])    drug addiction    BP (!) 134/97   Pulse 71   Ht 5\' 6"  (1.676 m)   Wt 229 lb (103.9 kg)   BMI 36.96 kg/m   She is awake alert and oriented x3 seems to be somewhat depressed today. She is ambulatory with a cane  Her left leg is swollen her knee is swollen the joint is swollen she is tender over the iliotibial band she has painful range of motion up to 90degrees in the left knee  The right knee is still symptomatic with patellofemoral  crepitance pain along the parapatellar joint  She has no neurovascular deficits has no peripheral edema to suggest a blood clot     We called over to [facility] internal medicine and got her blood work back. It is listed above  I ordered a sed rate I ordered a C-reactive protein rheumatoid factor  She will need an MRI of her right knee  I did an in office x-ray of her left knee and she has patellofemoral arthritis normal alignment of the tibia and femur with no arthritis in the main portion of the joint just in the patella  Rheumatology consult as well  Follow-up in 2 weeks

## 2019-09-13 LAB — RHEUMATOID FACTOR: Rheumatoid fact SerPl-aCnc: <10 IU/mL (ref 0.0–13.9)

## 2019-09-15 ENCOUNTER — Telehealth: Payer: Self-pay | Admitting: Orthopedic Surgery

## 2019-09-15 NOTE — Telephone Encounter (Signed)
Yes, will send this afternoon.

## 2019-09-15 NOTE — Telephone Encounter (Signed)
We had a voicemail from [facility] stating that this patient called them regarding a referral.  The lady from Rheumatology states that they need referral and patient notes sent to them.  The phone number for them is [phone] and the fax number is [phone].  Would you send this to them please?  Thanks

## 2019-09-22 ENCOUNTER — Encounter (HOSPITAL_COMMUNITY): Payer: 59 | Admitting: Physical Therapy

## 2019-09-23 ENCOUNTER — Encounter (HOSPITAL_COMMUNITY): Payer: 59 | Admitting: Physical Therapy

## 2019-09-29 ENCOUNTER — Ambulatory Visit: Payer: 59 | Admitting: Orthopedic Surgery

## 2019-09-29 ENCOUNTER — Encounter (HOSPITAL_COMMUNITY): Payer: 59 | Admitting: Physical Therapy

## 2019-10-03 ENCOUNTER — Ambulatory Visit (HOSPITAL_COMMUNITY)
Admission: RE | Admit: 2019-10-03 | Discharge: 2019-10-03 | Disposition: A | Discharge status: Home / Self Care | Payer: 59 | Source: Ambulatory Visit | Attending: Orthopedic Surgery | Admitting: Orthopedic Surgery

## 2019-10-03 ENCOUNTER — Other Ambulatory Visit: Payer: Self-pay

## 2019-10-03 DIAGNOSIS — G8929 Other chronic pain: Secondary | ICD-10-CM

## 2019-10-06 ENCOUNTER — Encounter (HOSPITAL_COMMUNITY): Payer: 59 | Admitting: Physical Therapy

## 2019-10-06 ENCOUNTER — Encounter: Payer: Self-pay | Admitting: Orthopedic Surgery

## 2019-10-06 ENCOUNTER — Ambulatory Visit (HOSPITAL_COMMUNITY)
Admission: RE | Admit: 2019-10-06 | Discharge: 2019-10-06 | Disposition: A | Discharge status: Home / Self Care | Payer: 59 | Source: Ambulatory Visit | Attending: Orthopedic Surgery | Admitting: Orthopedic Surgery

## 2019-10-06 ENCOUNTER — Other Ambulatory Visit: Payer: Self-pay

## 2019-10-06 ENCOUNTER — Ambulatory Visit (INDEPENDENT_AMBULATORY_CARE_PROVIDER_SITE_OTHER): Payer: 59 | Admitting: Orthopedic Surgery

## 2019-10-06 VITALS — BP 109/89 | HR 94 | Ht 66.0 in | Wt 236.0 lb

## 2019-10-06 DIAGNOSIS — M2241 Chondromalacia patellae, right knee: Secondary | ICD-10-CM | POA: Diagnosis not present

## 2019-10-06 DIAGNOSIS — G8929 Other chronic pain: Secondary | ICD-10-CM

## 2019-10-06 DIAGNOSIS — M545 Low back pain, unspecified: Secondary | ICD-10-CM

## 2019-10-06 DIAGNOSIS — M25562 Pain in left knee: Secondary | ICD-10-CM | POA: Diagnosis not present

## 2019-10-06 DIAGNOSIS — M25561 Pain in right knee: Secondary | ICD-10-CM | POA: Insufficient documentation

## 2019-10-06 NOTE — Progress Notes (Signed)
Chief Complaint  Patient presents with  . Knee Pain    right / has seen Rheumatologist did not think she has arthritis   . Results    review MRI and labs    Right knee was evaluated by MRI she has  Was fat pad inflammation and some patellofemoral disease  Her symptoms are all over the place really.  Her left knee is swollen she says that hurts she has some radicular symptoms on the left side as well as lower back pain  She has had chronic intermittent lower back pain as well  She is ambulating with a walking cane struggling with that as well  Left knee needs MRI evaluation for chronic effusion  Recommend MRI she will continue her current medications of tramadol ibuprofen and Robaxin  She saw her rheumatologist they do not think she has any evidence of rheumatologic disease.  Encounter Diagnoses  Name Primary?  . Chronic pain of right knee Yes  . Chronic pain of left knee   . Chondromalacia patellae, right knee   . Chronic left-sided low back pain without sciatica

## 2019-10-06 NOTE — Patient Instructions (Signed)
Mri left knee

## 2019-10-14 ENCOUNTER — Telehealth: Payer: Self-pay | Admitting: Orthopedic Surgery

## 2019-10-14 ENCOUNTER — Encounter (HOSPITAL_COMMUNITY): Payer: 59 | Admitting: Physical Therapy

## 2019-10-14 NOTE — Telephone Encounter (Signed)
Dr Aline Brochure did not check her platelets She states her primary care doctor checked this for her, not Dr Aline Brochure. She said they told her to ask you.  I discussed with her,, she wants to know what you think about the platelets abnormal.

## 2019-10-14 NOTE — Telephone Encounter (Signed)
Patient has question about her blood platelets per last lab work ordered by Dr Aline Brochure. Michela Pitcher would like to know what she can be doing for that. Also asking about status of MRI, which notes indicate that notes were faxed to insurer on 10/07/19.

## 2019-10-16 NOTE — Telephone Encounter (Signed)
Ask primary care doctor I can not advise

## 2019-10-16 NOTE — Telephone Encounter (Signed)
I discussed with patient she has voiced understanding.

## 2019-10-20 ENCOUNTER — Encounter (HOSPITAL_COMMUNITY): Payer: 59 | Admitting: Physical Therapy

## 2019-10-20 ENCOUNTER — Telehealth (HOSPITAL_COMMUNITY): Payer: Self-pay | Admitting: *Deleted

## 2019-10-20 NOTE — Telephone Encounter (Signed)
To double check, she is still on lamotrigine, is that correct? Also please ask which medication she was instructed by Dr. Brigitte Pulse to discontinue.

## 2019-10-20 NOTE — Telephone Encounter (Signed)
I would advise her to stay on lamotrigine 150 mg daily if that is not advised by Dr. Brigitte Pulse to discontinue. Please let me know if she is not taking this medication more than a week as she needs to restart from lower dose. Please also contact the pharmacy so that this medication will be sent to her.

## 2019-10-20 NOTE — Telephone Encounter (Signed)
Could you ask her which medication Dr. Brigitte Pulse recommended to stop so that she can re-start medication she needs to continue.

## 2019-10-20 NOTE — Telephone Encounter (Signed)
Patient called stating she have to come off of Buspar and Cymbalta and Trazodone. Per pt the reason for that is due to blood going up and dropping real real low. The patient Dr. Brigitte Pulse told her to come off of these medications on Friday. Per pt she stopped taking all of her medications. Per pt she states she is doing good. Per pt her medications comes in a pill pack and she don't know which one is which so she just stopped all of them. Per pt they will be doing BP with Dr. Brigitte Pulse.

## 2019-10-20 NOTE — Telephone Encounter (Signed)
Per pt Dr. Brigitte Pulse told her to stop them. Buspar and Cymbalta and Trazodone. And told her to let Dr. Modesta Messing that she needed to stop that due to BP shooting up and then dropping down like it did. Per pt If provider have any questions to please call her.

## 2019-10-20 NOTE — Telephone Encounter (Signed)
Patient stated she stopped taking all of her medications and is feeling good. Per pt because she have her medications in a pill pack she can not tell which medications is which so she stopped taking all of her medications

## 2019-10-21 ENCOUNTER — Telehealth (HOSPITAL_COMMUNITY): Payer: Self-pay | Admitting: Psychiatry

## 2019-10-21 NOTE — Telephone Encounter (Signed)
She states that she was advised to discontinue all of her medication prescribed by this provider due to hypertension/hypotension. She reports good support from people at church. She denies any mood issues. She is advised to contact the office if any worsening in mood symptoms.   She states that she is planning to transfer the care to other mental health provider, and is not planning to see this provider anymore. Will plan to write discharge letter to her.

## 2019-10-28 ENCOUNTER — Other Ambulatory Visit: Payer: Self-pay

## 2019-10-28 ENCOUNTER — Ambulatory Visit (HOSPITAL_COMMUNITY)
Admission: RE | Admit: 2019-10-28 | Discharge: 2019-10-28 | Disposition: A | Discharge status: Home / Self Care | Payer: 59 | Source: Ambulatory Visit | Attending: Orthopedic Surgery | Admitting: Orthopedic Surgery

## 2019-10-28 DIAGNOSIS — M25562 Pain in left knee: Secondary | ICD-10-CM | POA: Insufficient documentation

## 2019-10-28 DIAGNOSIS — G8929 Other chronic pain: Secondary | ICD-10-CM

## 2019-10-30 IMAGING — DX RIGHT SHOULDER - 2+ VIEW
2 series · 2 of 2 positions shown · non-contrast
Comparison: None.

CLINICAL DATA: Right shoulder, radiating to right neck. Difficulty
elevating

EXAM:
RIGHT SHOULDER - 2+ VIEW

[shoulder grashey]
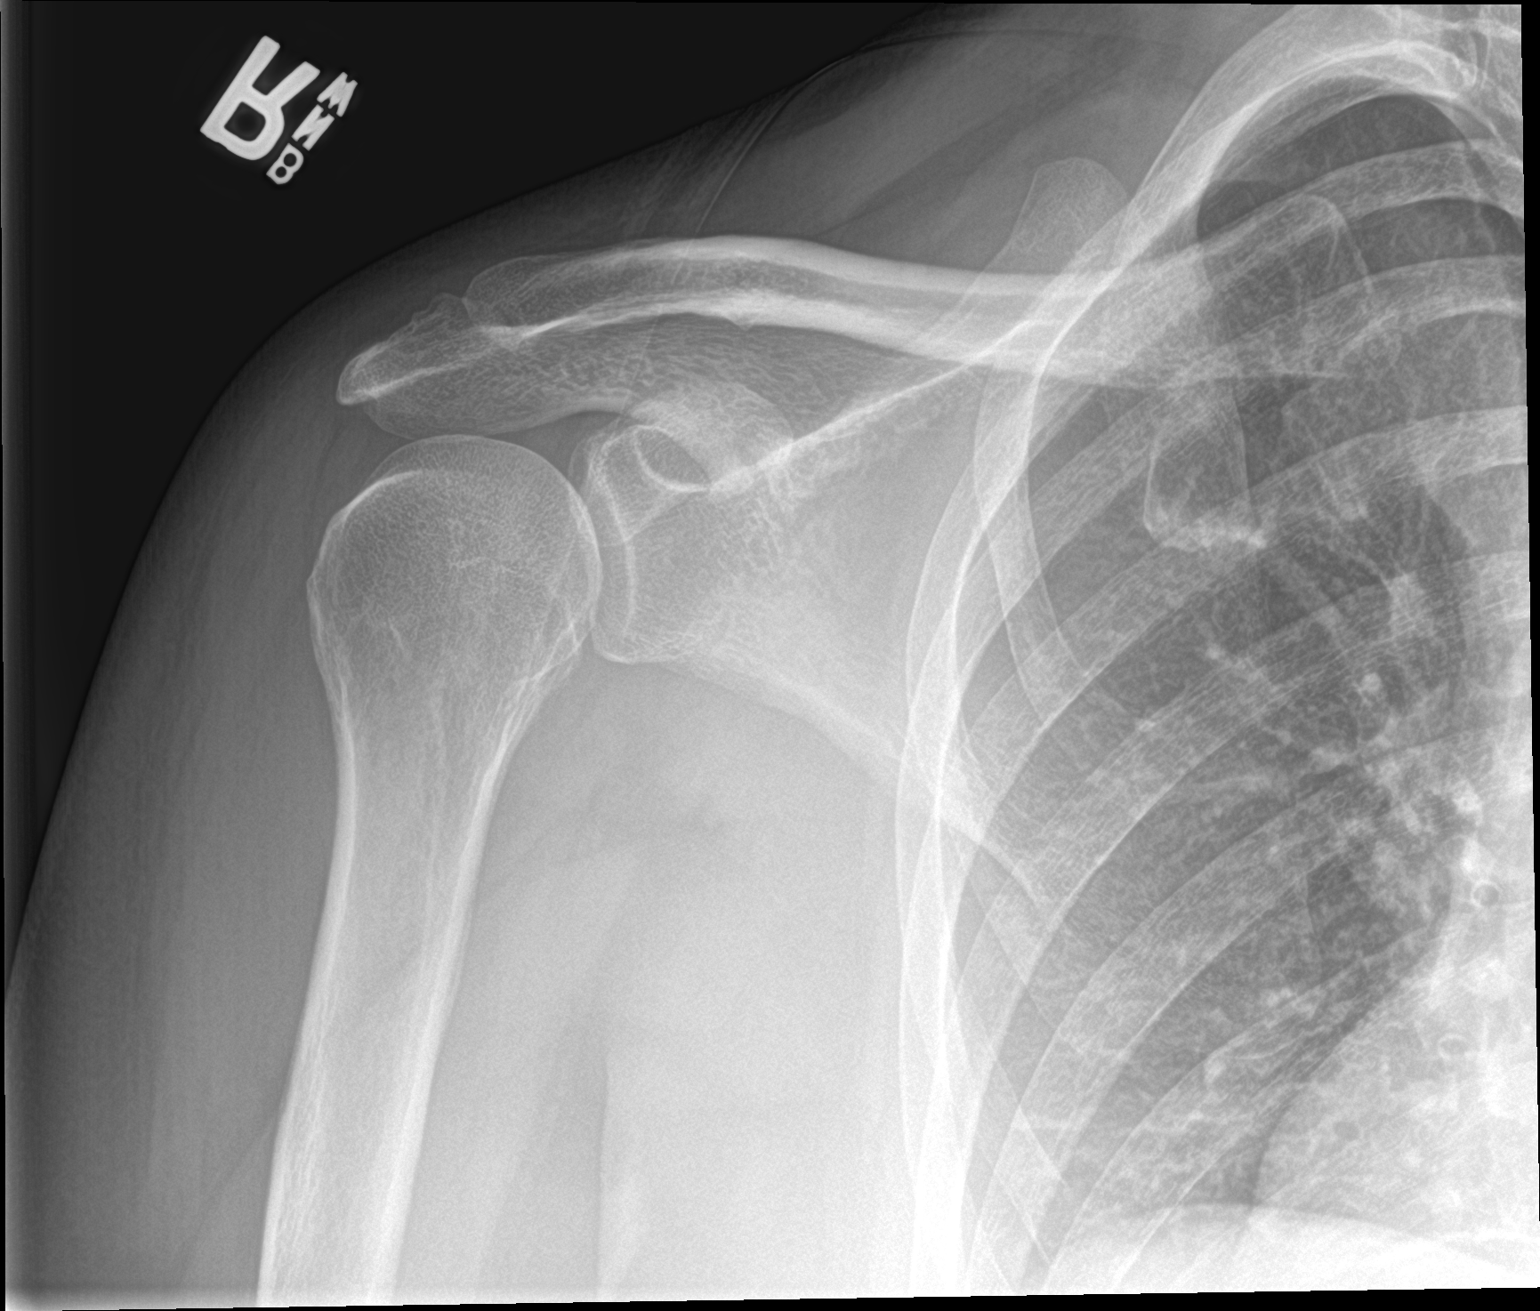

[shoulder y view]
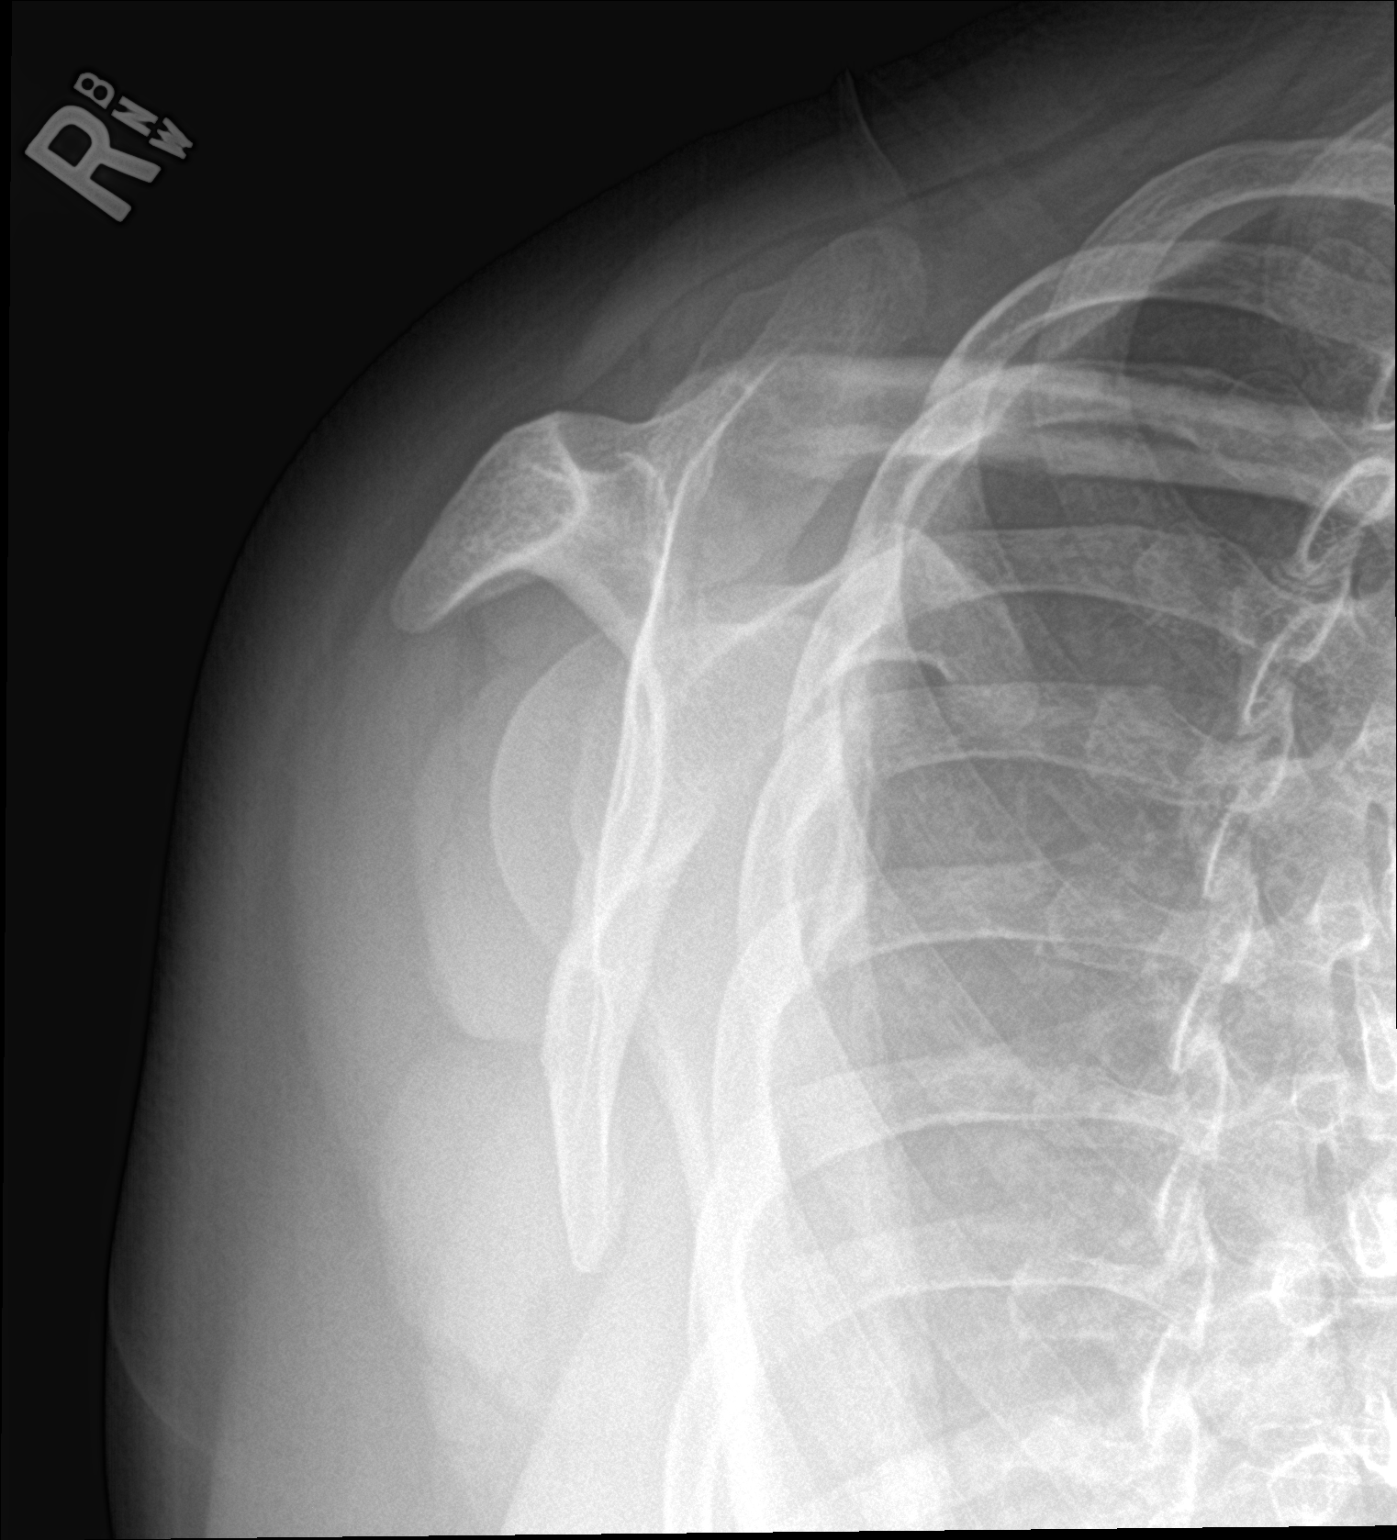

[2 of 2 positions shown; findings below may reference images not displayed]

FINDINGS: Unable to position patient for axillary view. There is no evidence
of fracture or dislocation. There is no evidence of arthropathy or
other focal bone abnormality. Soft tissues are unremarkable.
Included portions of the right lung are clear.
IMPRESSION: Negative.

## 2019-11-04 ENCOUNTER — Other Ambulatory Visit: Payer: Self-pay

## 2019-11-04 ENCOUNTER — Ambulatory Visit (INDEPENDENT_AMBULATORY_CARE_PROVIDER_SITE_OTHER): Payer: 59 | Admitting: Gastroenterology

## 2019-11-04 ENCOUNTER — Encounter: Payer: Self-pay | Admitting: Gastroenterology

## 2019-11-04 VITALS — BP 135/84 | HR 105 | Temp 97.1°F | Ht 66.0 in | Wt 237.4 lb

## 2019-11-04 DIAGNOSIS — K5900 Constipation, unspecified: Secondary | ICD-10-CM

## 2019-11-04 DIAGNOSIS — K219 Gastro-esophageal reflux disease without esophagitis: Secondary | ICD-10-CM

## 2019-11-04 MED ORDER — LINACLOTIDE 145 MCG PO CAPS: 145.0000 ug | ORAL_CAPSULE | Freq: Every day | ORAL | 3 refills | Status: DC

## 2019-11-04 MED ORDER — CLOTRIMAZOLE 1 % EX CREA: 1.0000 "application " | TOPICAL_CREAM | Freq: Two times a day (BID) | CUTANEOUS | 0 refills | Status: DC

## 2019-11-04 NOTE — Patient Instructions (Addendum)
I sent Linzess back to your pharmacy. Let me know if it doesn't show up in your pack.  I sent in a cream called Clotrimazole to use around the buttocks where it is itching. If not better, let me know.  I am referring you to ENT specialist due to your vocal hoarseness. Continue Protonix twice a day, 30 minutes before breakfast and dinner. You can take Pepcid as needed (over-the-counter).  We will see you in 3 months!  I enjoyed seeing you again today! As you know, I value our relationship and want to provide genuine, compassionate, and quality care. I welcome your feedback. If you receive a survey regarding your visit,  I greatly appreciate you taking time to fill this out. See you next time!  Annitta Needs, PhD, ANP-BC [facility]   Food Choices for Gastroesophageal Reflux Disease, Adult When you have gastroesophageal reflux disease (GERD), the foods you eat and your eating habits are very important. Choosing the right foods can help ease your discomfort. Think about working with a nutrition specialist (dietitian) to help you make good choices. What are tips for following this plan?  Meals  Choose healthy foods that are low in fat, such as fruits, vegetables, whole grains, low-fat dairy products, and lean meat, fish, and poultry.  Eat small meals often instead of 3 large meals a day. Eat your meals slowly, and in a place where you are relaxed. Avoid bending over or lying down until 2-3 hours after eating.  Avoid eating meals 2-3 hours before bed.  Avoid drinking a lot of liquid with meals.  Cook foods using methods other than frying. Bake, grill, or broil food instead.  Avoid or limit: ? Chocolate. ? Peppermint or spearmint. ? Alcohol. ? Pepper. ? Black and decaffeinated coffee. ? Black and decaffeinated tea. ? Bubbly (carbonated) soft drinks. ? Caffeinated energy drinks and soft drinks.  Limit high-fat foods such as: ? Fatty meat or fried foods. ? Whole milk,  cream, butter, or ice cream. ? Nuts and nut butters. ? Pastries, donuts, and sweets made with butter or shortening.  Avoid foods that cause symptoms. These foods may be different for everyone. Common foods that cause symptoms include: ? Tomatoes. ? Oranges, lemons, and limes. ? Peppers. ? Spicy food. ? Onions and garlic. ? Vinegar. Lifestyle  Maintain a healthy weight. Ask your doctor what weight is healthy for you. If you need to lose weight, work with your doctor to do so safely.  Exercise for at least 30 minutes for 5 or more days each week, or as told by your doctor.  Wear loose-fitting clothes.  Do not smoke. If you need help quitting, ask your doctor.  Sleep with the head of your bed higher than your feet. Use a wedge under the mattress or blocks under the bed frame to raise the head of the bed. Summary  When you have gastroesophageal reflux disease (GERD), food and lifestyle choices are very important in easing your symptoms.  Eat small meals often instead of 3 large meals a day. Eat your meals slowly, and in a place where you are relaxed.  Limit high-fat foods such as fatty meat or fried foods.  Avoid bending over or lying down until 2-3 hours after eating.  Avoid peppermint and spearmint, caffeine, alcohol, and chocolate. This information is not intended to replace advice given to you by your health care provider. Make sure you discuss any questions you have with your health care provider. Document Revised: 05/16/2018  Document Reviewed: 02/29/2016 Elsevier Patient Education  [employer].

## 2019-11-04 NOTE — Progress Notes (Signed)
Referring Provider: Medicine, [facility] Noss Internal Primary Care Physician:  [name], NP Primary GI: Dr. Gala Romney   Chief Complaint  Patient presents with  . Constipation    f/u.BM's 2-3 days, no straining, stools are very soft    HPI:   Jennifer [name] is a 49 y.o. female presenting today with a history of constipation and GERD, s/p hemorrhoid banding 2019.GES normal in the past. EGD and colonoscopy on file from July 2020.     Drinking OJ in the morning flares reflux. Protonix BID. Was eating oranges and tangerines. Eating grapes, peaches. Vocal hoarseness is what she has noticed. Uses seasonings for chicken, fish. Vocal hoarseness for 3 months. States she was told her voice was deep. Not eating late at night.   Doesn't have Linzess in her pre-packaged meds. This worked well in the past. Down to 2 cigarettes per day. Linzess 145 mcg daily historically worked well. Notes itching between buttocks. Feels this is yeast-related. Declining rectal exam. Requesting topical medication we had provided years ago.     Past Medical History:  Diagnosis Date  . Allergy   . Anemia   . Anxiety   . Arthritis    neck  . Asthma   . Diabetes mellitus without complication ([place])   . GERD (gastroesophageal reflux disease)   . History of flexible sigmoidoscopy 1999   Normal to 60cm,  . Hypertension   . Neuromuscular disorder ([place])   . Ovarian cancer ([place])    skin cancer  . Panic attack   . PTSD (post-traumatic stress disorder)   . S/P endoscopy Feb 2012   Nl, s/p [facility] dilator, biopsy benign  . S/P endoscopy 2008   Mild gastritis  . Seizures ([place])   . Substance abuse ([place])    drug addiction    Past Surgical History:  Procedure Laterality Date  . ABDOMINAL HYSTERECTOMY     ovarian tumors  . CHOLECYSTECTOMY    . COLONOSCOPY  03/01/2011   [place]-External hemorrhoids/otherwise normal rectum and colon.  . COLONOSCOPY WITH PROPOFOL N/A 08/08/2018   entire examined colon is  normal. The distal rectum and anal verge are normal on retroflexion view. Repeat in 5 years.   . ESOPHAGOGASTRODUODENOSCOPY  2008   gastritis  . ESOPHAGOGASTRODUODENOSCOPY  2012   Moderate sized hiatal hernia, non-H. pylori gastritis  . ESOPHAGOGASTRODUODENOSCOPY N/A 11/17/2013   RMR: Mild erosive reflux esophagitis. Patulous EG junction  . ESOPHAGOGASTRODUODENOSCOPY (EGD) WITH PROPOFOL N/A 08/08/2018   Erosive reflux esophagitis. Dilated. Small hiatal hernia. Otherwise normal.   . HERNIA REPAIR     Incisional with mesh  . KNEE ARTHROSCOPY     left knee surgery in [place]  . MALONEY DILATION  08/08/2018   Procedure: MALONEY DILATION;  Surgeon: Daneil Dolin, MD;  Location: [facility];  Service: Endoscopy;;  . PARTIAL HYSTERECTOMY    . SKIN CANCER EXCISION     left bicep  . TONSILLECTOMY      Current Outpatient Medications  Medication Sig Dispense Refill  . cetirizine (ZYRTEC) 10 MG tablet Take 1 tablet (10 mg total) by mouth every morning. 90 tablet 3  . conjugated estrogens (PREMARIN) vaginal cream Place 0.10 Applicatorfuls vaginally 3 (three) times a week. For vaginal thinning and irritation, dryness 30 g 2  . EPINEPHrine 0.3 mg/0.3 mL IJ SOAJ injection Inject 0.3 mg into the muscle as needed for anaphylaxis.    . fluticasone (FLONASE) 50 MCG/ACT nasal spray Place 1 spray into both nostrils  daily. 16 g 11  . lamoTRIgine (LAMICTAL) 150 MG tablet Take 1 tablet (150 mg total) by mouth daily. 28 tablet 2  . Levetiracetam 750 MG TB24 Take 3 tablets (2,250 mg total) by mouth at bedtime. 270 tablet 4  . methocarbamol (ROBAXIN) 500 MG tablet Take 500-1,000 mg by mouth 2 (two) times daily as needed.    . pantoprazole (PROTONIX) 40 MG tablet Take 40 mg by mouth 2 (two) times daily.    . traZODone (DESYREL) 100 MG tablet TAKE 1/2 TO 1 TABLET AT BEDTIME AS NEEDED FOR SLEEP. 28 tablet 2  . Vitamin D, Ergocalciferol, (DRISDOL) 1.25 MG (50000 UT) CAPS capsule Take 1 capsule (50,000 Units  total) by mouth every 7 (seven) days. 5 capsule 11  . busPIRone (BUSPAR) 10 MG tablet Take 2 tablets (20 mg total) by mouth 3 (three) times daily. (Patient not taking: Reported on 11/04/2019) 168 tablet 2  . DULoxetine (CYMBALTA) 60 MG capsule Take 1 capsule (60 mg total) by mouth daily. (Patient not taking: Reported on 11/04/2019) 28 capsule 2  . fluorometholone (FML) 0.1 % ophthalmic suspension Place 1 drop into both eyes 3 (three) times daily. (Patient not taking: Reported on 11/04/2019)    . gabapentin (NEURONTIN) 300 MG capsule gabapentin 300 mg capsule  Take 1 capsule twice a day by oral route.  1 at bedtime x 1 week then 1 twice a day as tolerated (Patient not taking: Reported on 11/04/2019)    . linaclotide (LINZESS) 145 MCG CAPS capsule Take 145 mcg by mouth daily before breakfast. (Patient not taking: Reported on 11/04/2019)    . meloxicam (MOBIC) 7.5 MG tablet TAKE (1) TABLET BY MOUTH ONCE DAILY. (Patient not taking: Reported on 11/04/2019) 30 tablet 0  . nitroGLYCERIN (NITROSTAT) 0.4 MG SL tablet Place 0.4 mg under the tongue every 5 (five) minutes as needed for chest pain. (Patient not taking: Reported on 11/04/2019)    . ondansetron (ZOFRAN) 4 MG tablet Take 1 tablet (4 mg total) by mouth every 8 (eight) hours as needed for nausea or vomiting. (Patient not taking: Reported on 11/04/2019) 60 tablet 1  . oxybutynin (DITROPAN-XL) 5 MG 24 hr tablet Take 1 tablet (5 mg total) by mouth at bedtime. (Patient not taking: Reported on 11/04/2019) 30 tablet 3  . traMADol (ULTRAM) 50 MG tablet Take by mouth every 6 (six) hours as needed. (Patient not taking: Reported on 11/04/2019)     No current facility-administered medications for this visit.    Allergies as of 11/04/2019 - Review Complete 11/04/2019  Allergen Reaction Noted  . Metrizamide Rash and Swelling 10/17/2010  . Shellfish allergy Swelling 10/29/2013  . Amoxapine Nausea Only 10/01/2019  . Amoxapine and related  07/12/2018  . Codeine Other  (See Comments)   . Hydrocodone Itching 12/03/2012  . Ivp dye [iodinated diagnostic agents] Rash 10/17/2010  . Latex Rash 03/08/2010    Family History  Problem Relation Age of Onset  . Crohn's disease Maternal Aunt   . Breast cancer Maternal Grandmother   . Cancer Maternal Grandmother   . Colon cancer Maternal Grandfather        44  . Pancreatic cancer Maternal Grandfather   . Cancer Maternal Grandfather   . Crohn's disease Cousin        x 2 cousins  . Breast cancer Daughter 57  . Arthritis Mother   . COPD Mother   . Depression Mother   . Drug abuse Mother   . Heart disease Mother   .  Hypertension Mother   . Hyperlipidemia Mother   . Learning disabilities Mother   . Mental illness Mother   . Alcohol abuse Mother   . Colon polyps Mother 30       tubular adenoma  . Early death Father 25       murder  . Alcohol abuse Father   . Drug abuse Father   . Learning disabilities Father   . Cancer Paternal Grandmother   . Alcohol abuse Paternal Grandmother   . Cancer Paternal Grandfather   . Alcohol abuse Maternal Uncle   . Alcohol abuse Paternal Uncle   . Drug abuse Paternal Uncle     Social History   Socioeconomic History  . Marital status: Married    Spouse name: [name]  . Number of children: Not on file  . Years of education: Not on file  . Highest education level: 11th grade  Occupational History  . Occupation: disabled    Fish farm manager: NOT EMPLOYED  Tobacco Use  . Smoking status: Current Some Day Smoker    Packs/day: 1.50    Years: 26.00    Pack years: 39.00    Types: Cigarettes    Start date: 05/03/1984  . Smokeless tobacco: Never Used  . Tobacco comment: started back about 2 weeks ago   Vaping Use  . Vaping Use: Never used  Substance and Sexual Activity  . Alcohol use: Yes    Comment: occassionally  . Drug use: Yes    Types: Marijuana  . Sexual activity: Not Currently    Birth control/protection: Surgical  Other Topics Concern  . Not on file  Social  History Narrative   Lives at home with her husband.   Right handed.    1-2 cups of coffee 3 times per wk.    Intermittent soda.   Social Determinants of Health   Financial Resource Strain: Low Risk   . Difficulty of Paying Living Expenses: Not hard at all  Food Insecurity: No Food Insecurity  . Worried About Charity fundraiser in the Last Year: Never true  . Ran Out of Food in the Last Year: Never true  Transportation [name]: No Transportation [name]  . Lack of Transportation (Medical): No  . Lack of Transportation (Non-Medical): No  Physical Activity: Inactive  . Days of Exercise per Week: 0 days  . Minutes of Exercise per Session: 0 min  Stress: Stress Concern Present  . Feeling of Stress : To some extent  Social Connections: Moderately Integrated  . Frequency of Communication with Friends and Family: More than three times a week  . Frequency of Social Gatherings with Friends and Family: More than three times a week  . Attends Religious Services: 1 to 4 times per year  . Active Member of Clubs or Organizations: No  . Attends Archivist Meetings: Never  . Marital Status: Married    Review of Systems: Gen: Denies fever, chills, anorexia. Denies fatigue, weakness, weight loss.  CV: Denies chest pain, palpitations, syncope, peripheral edema, and claudication. Resp: Denies dyspnea at rest, cough, wheezing, coughing up blood, and pleurisy. GI: see HPI Derm: Denies rash, itching, dry skin Psych: Denies depression, anxiety, memory loss, confusion. No homicidal or suicidal ideation.  Heme: Denies bruising, bleeding, and enlarged lymph nodes.  Physical Exam: BP 135/84   Pulse (!) 105   Temp (!) 97.1 F (36.2 C) (Oral)   Ht 5\' 6"  (1.676 m)   Wt 237 lb 6.4 oz (107.7 kg)   BMI 38.32  kg/m  General:   Alert and oriented. No distress noted. Pleasant and cooperative.  Head:  Normocephalic and atraumatic. Eyes:  Conjuctiva clear without scleral icterus. Mouth:  Mask in  place Abdomen:  +BS, soft, mild point tenderness to left of umbilicus and non-distended. No rebound or guarding. No HSM or masses noted. Msk:  Symmetrical without gross deformities. Normal posture. Extremities:  Without edema. Neurologic:  Alert and  oriented x4 Psych:  Alert and cooperative. Normal mood and affect.  ASSESSMENT/PLAN: CARLETHIA MESQUITA is a 49 y.o. female presenting today with history of chronic constipation and GERD for routine follow-up.  Linzess 145 mcg has helped historically; however, this has not been included in her med pack. I have refilled this and provided samples. She describes what sounds as yeast between gluteal folds. Clotrimazole cream sent to pharmacy. She declined exam.   GERD exacerbations likely dietary-related. I suspect she has element of LPR. With vocal hoarseness, refer to ENT. She is to continue on PPI BID, add Pepcid as needed, and we reviewed dietary and behavior measures again in detail.   Return in 3 months.  Jennifer Needs, PhD, ANP-BC [facility]

## 2019-11-06 ENCOUNTER — Telehealth: Payer: Self-pay | Admitting: Internal Medicine

## 2019-11-06 NOTE — Telephone Encounter (Signed)
Spoke with pts pharmacy, they do have the RX and will have it ready for pickup in 1 hour. Spoke with pt and she is aware.

## 2019-11-06 NOTE — Telephone Encounter (Signed)
Patient pharmacy stated that they did not receive her prescription for the cream that was sent in (stated in epic that they did receive)  Please resend to her pharmacy

## 2019-11-13 ENCOUNTER — Telehealth (HOSPITAL_COMMUNITY): Payer: 59 | Admitting: Psychiatry

## 2019-11-17 ENCOUNTER — Telehealth: Payer: Self-pay | Admitting: Internal Medicine

## 2019-11-17 NOTE — Telephone Encounter (Signed)
CALLED PATIENT AND GAVE HER THE PHONE NUMBER

## 2019-11-17 NOTE — Telephone Encounter (Signed)
Patient called stating we referred her to en ENT in [place] and she needs their number so she can reschedule

## 2019-11-17 NOTE — Telephone Encounter (Signed)
Pt can call Dr. Deeann Saint office

## 2019-12-08 IMAGING — DX DG CERVICAL SPINE COMPLETE 4+V
6 series · 6 of 6 positions shown · non-contrast
Comparison: None.

CLINICAL DATA: Cervicalgia

EXAM:
CERVICAL SPINE - COMPLETE 4+ VIEW

[c-spine lat]
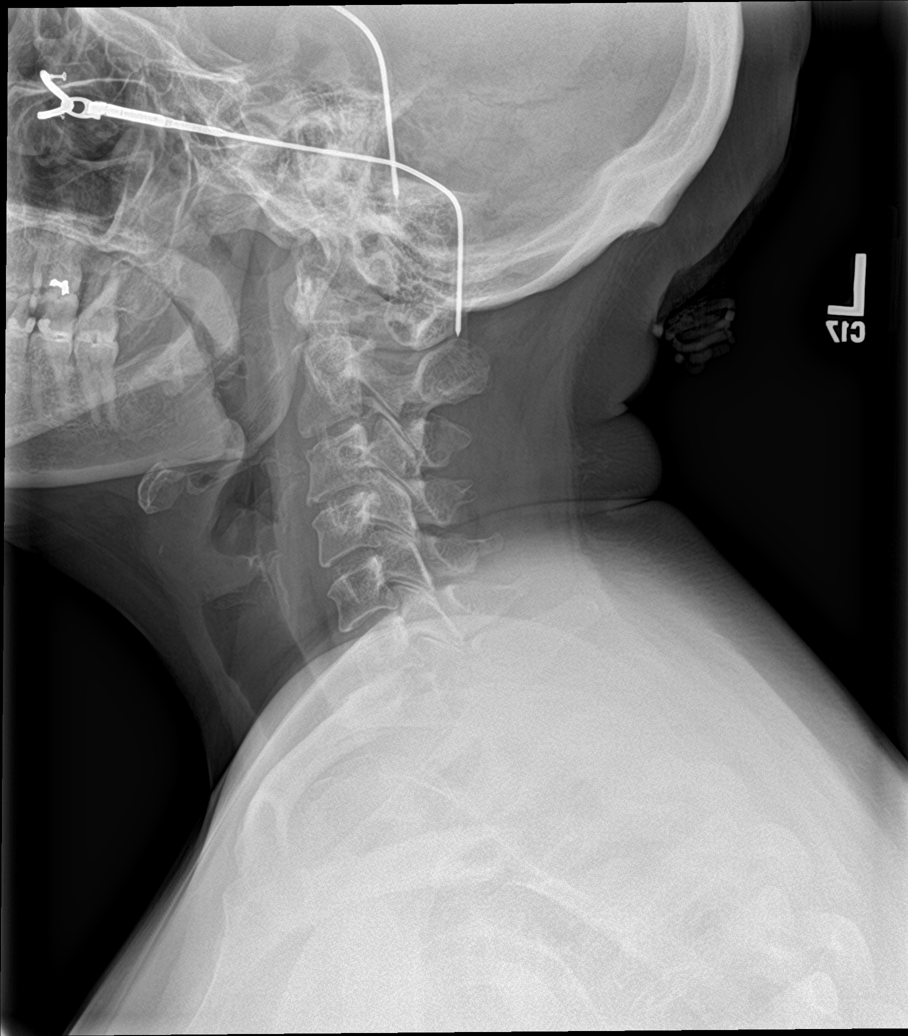

[c-spine obl (1 of 2)]
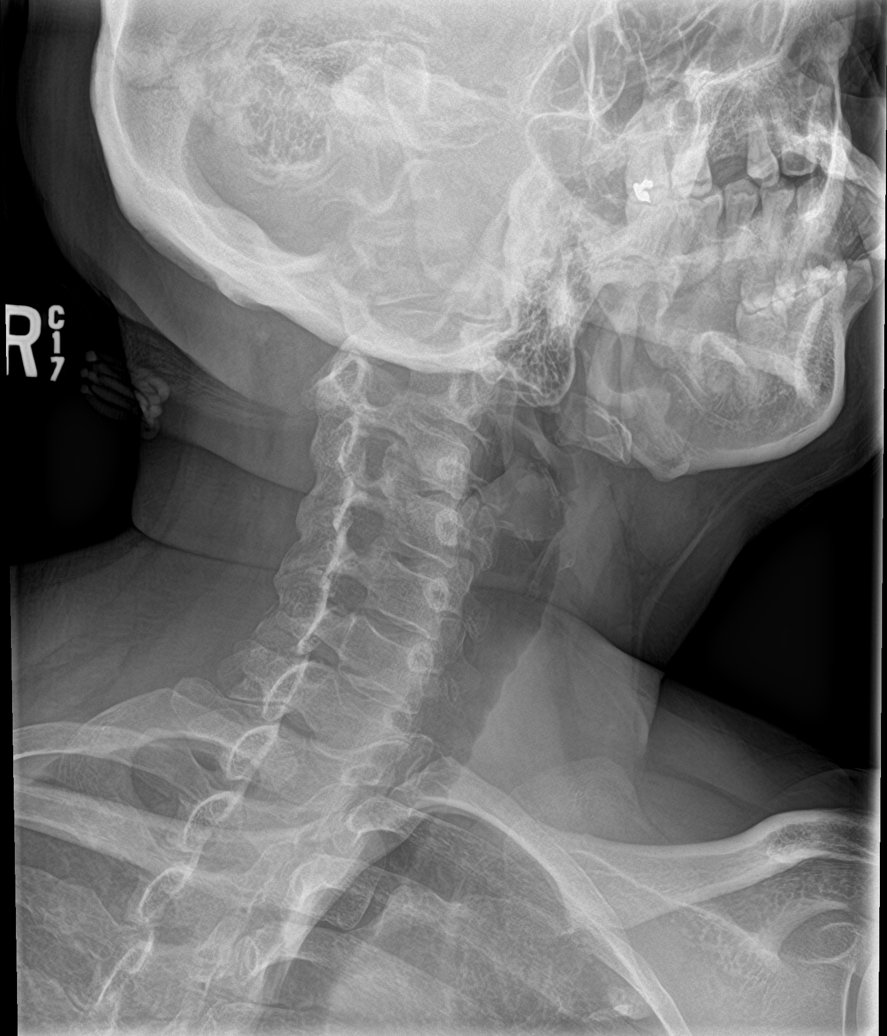

[c-spine obl (2 of 2)]
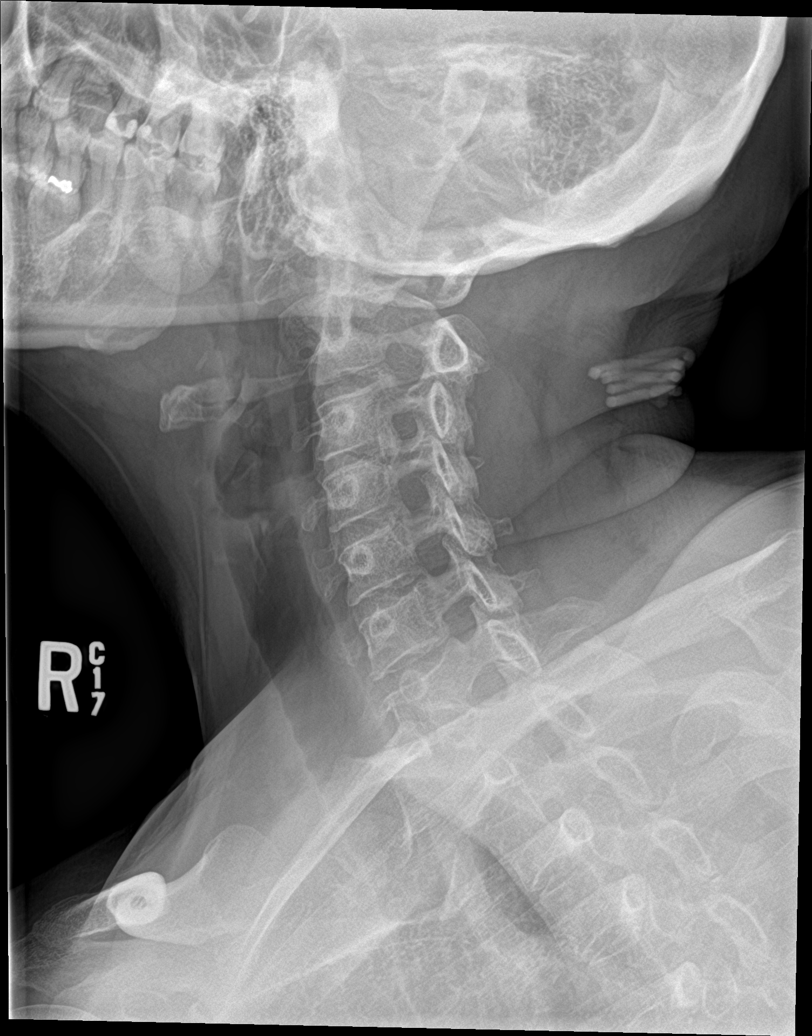

[c-spine ap]
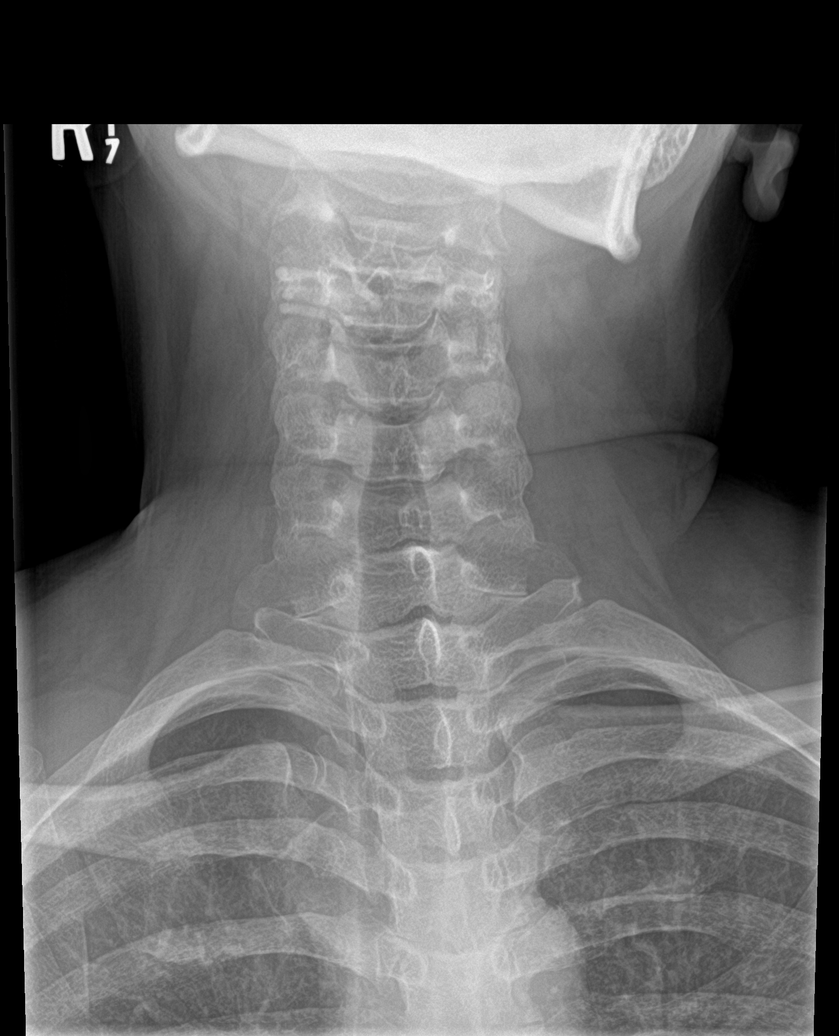

[c-spine open mouth]
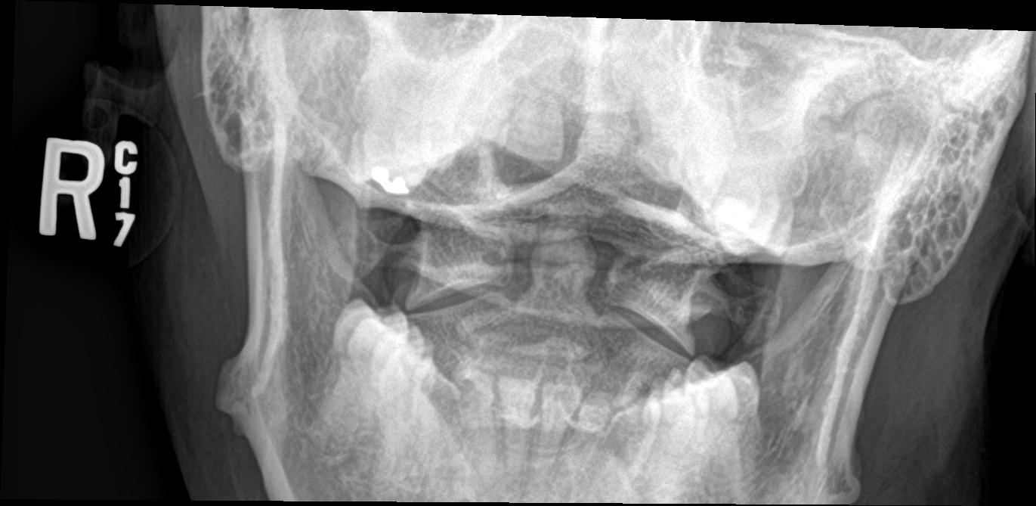

[c-spine swimmers trauma]
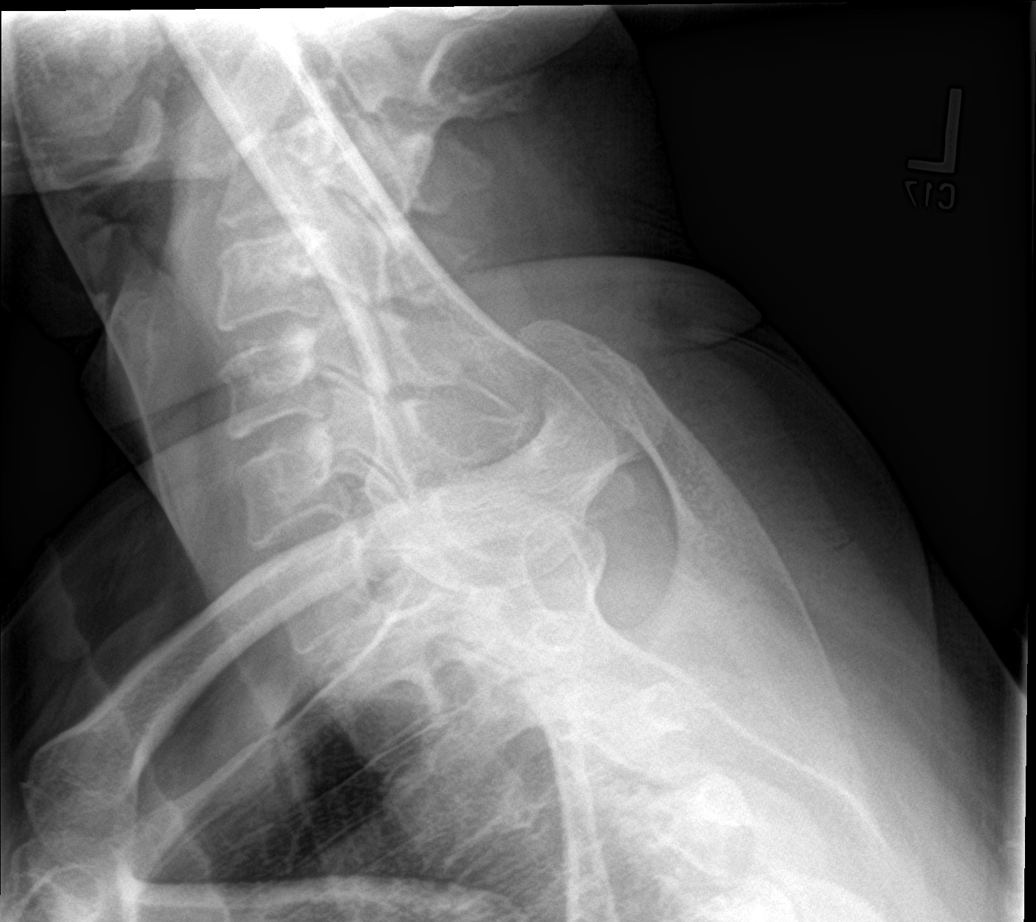

[6 of 6 positions shown; findings below may reference images not displayed]

FINDINGS: There is no evidence of cervical spine fracture or prevertebral soft
tissue swelling. Alignment is normal. No other significant bone
abnormalities are identified. Mild degenerative disease with disc
height loss at C3-4. No foraminal stenosis.
IMPRESSION: No significant cervical spine spondylosis.

## 2019-12-10 ENCOUNTER — Ambulatory Visit (HOSPITAL_COMMUNITY): Payer: 59 | Admitting: Psychiatry

## 2019-12-23 ENCOUNTER — Telehealth: Payer: Self-pay | Admitting: Orthopedic Surgery

## 2019-12-23 NOTE — Telephone Encounter (Signed)
Patient called to relay that she has never heard back from our office about the MRI of her left knee; had been seen in office following MRI of right knee.  I offered appointment for this week in cancellation slot, however, patient cannot come.  Scheduled for next available 01/21/20 - unless Dr Aline Brochure would like to relay results by phone?  Please advise.

## 2020-01-21 ENCOUNTER — Encounter: Payer: Self-pay | Admitting: Orthopedic Surgery

## 2020-01-21 ENCOUNTER — Other Ambulatory Visit: Payer: Self-pay

## 2020-01-21 ENCOUNTER — Ambulatory Visit (INDEPENDENT_AMBULATORY_CARE_PROVIDER_SITE_OTHER): Payer: 59 | Admitting: Orthopedic Surgery

## 2020-01-21 VITALS — BP 137/85 | HR 74 | Ht 66.0 in | Wt 237.0 lb

## 2020-01-21 DIAGNOSIS — F1211 Cannabis abuse, in remission: Secondary | ICD-10-CM | POA: Insufficient documentation

## 2020-01-21 DIAGNOSIS — G8929 Other chronic pain: Secondary | ICD-10-CM

## 2020-01-21 DIAGNOSIS — M25561 Pain in right knee: Secondary | ICD-10-CM

## 2020-01-21 DIAGNOSIS — F1010 Alcohol abuse, uncomplicated: Secondary | ICD-10-CM | POA: Insufficient documentation

## 2020-01-21 DIAGNOSIS — R631 Polydipsia: Secondary | ICD-10-CM | POA: Insufficient documentation

## 2020-01-21 DIAGNOSIS — N289 Disorder of kidney and ureter, unspecified: Secondary | ICD-10-CM | POA: Insufficient documentation

## 2020-01-21 DIAGNOSIS — M1712 Unilateral primary osteoarthritis, left knee: Secondary | ICD-10-CM | POA: Diagnosis not present

## 2020-01-21 DIAGNOSIS — R350 Frequency of micturition: Secondary | ICD-10-CM | POA: Insufficient documentation

## 2020-01-21 DIAGNOSIS — M5412 Radiculopathy, cervical region: Secondary | ICD-10-CM | POA: Insufficient documentation

## 2020-01-21 DIAGNOSIS — Z62810 Personal history of physical and sexual abuse in childhood: Secondary | ICD-10-CM | POA: Insufficient documentation

## 2020-01-21 DIAGNOSIS — F70 Mild intellectual disabilities: Secondary | ICD-10-CM | POA: Insufficient documentation

## 2020-01-21 DIAGNOSIS — M1710 Unilateral primary osteoarthritis, unspecified knee: Secondary | ICD-10-CM

## 2020-01-21 DIAGNOSIS — M25562 Pain in left knee: Secondary | ICD-10-CM

## 2020-01-21 DIAGNOSIS — F1411 Cocaine abuse, in remission: Secondary | ICD-10-CM | POA: Insufficient documentation

## 2020-01-21 DIAGNOSIS — N182 Chronic kidney disease, stage 2 (mild): Secondary | ICD-10-CM

## 2020-01-21 DIAGNOSIS — E1121 Type 2 diabetes mellitus with diabetic nephropathy: Secondary | ICD-10-CM | POA: Insufficient documentation

## 2020-01-21 DIAGNOSIS — S139XXA Sprain of joints and ligaments of unspecified parts of neck, initial encounter: Secondary | ICD-10-CM | POA: Insufficient documentation

## 2020-01-21 DIAGNOSIS — F333 Major depressive disorder, recurrent, severe with psychotic symptoms: Secondary | ICD-10-CM | POA: Insufficient documentation

## 2020-01-21 DIAGNOSIS — IMO0002 Reserved for concepts with insufficient information to code with codable children: Secondary | ICD-10-CM | POA: Insufficient documentation

## 2020-01-21 HISTORY — DX: Chronic kidney disease, stage 2 (mild): N18.2

## 2020-01-21 NOTE — Progress Notes (Signed)
Chief Complaint  Patient presents with  . Results    MRI left knee review   . Knee Pain    Left    49 year old female is had bilateral MRIs right and left knee both MRIs show arthritis of the knee no tear  Patient now experiencing dizziness and is being worked up for cardiac problems  Recommend she get injections in her knee  I also reviewed her left knee MRI and it shows arthritic changes without surgical arthroscopic pathology.   Procedure note for bilateral knee injections  Procedure note left knee injection verbal consent was obtained to inject left knee joint  Timeout was completed to confirm the site of injection  The medications used were 40 mg of Depo-Medrol and 1% lidocaine 3 cc  Anesthesia was provided by ethyl chloride and the skin was prepped with alcohol.  After cleaning the skin with alcohol a 20-gauge needle was used to inject the left knee joint. There were no complications. A sterile bandage was applied.   Procedure note right knee injection verbal consent was obtained to inject right knee joint  Timeout was completed to confirm the site of injection  The medications used were 40 mg of Depo-Medrol and 1% lidocaine 3 cc  Anesthesia was provided by ethyl chloride and the skin was prepped with alcohol.  After cleaning the skin with alcohol a 20-gauge needle was used to inject the right knee joint. There were no complications. A sterile bandage was applied.  Encounter Diagnoses  Name Primary?  . Chronic pain of right knee Yes  . Chronic pain of left knee   . Primary localized osteoarthritis of knee both knees     Mri left knee

## 2020-01-21 NOTE — Patient Instructions (Signed)

## 2020-02-03 ENCOUNTER — Encounter: Payer: Self-pay | Admitting: *Deleted

## 2020-02-03 ENCOUNTER — Other Ambulatory Visit: Payer: Self-pay

## 2020-02-03 ENCOUNTER — Telehealth: Payer: Self-pay | Admitting: *Deleted

## 2020-02-03 ENCOUNTER — Encounter: Payer: Self-pay | Admitting: Gastroenterology

## 2020-02-03 ENCOUNTER — Ambulatory Visit: Payer: 59 | Admitting: Gastroenterology

## 2020-02-03 VITALS — BP 111/71 | HR 70 | Temp 97.9°F | Ht 66.0 in | Wt 249.2 lb

## 2020-02-03 DIAGNOSIS — R1310 Dysphagia, unspecified: Secondary | ICD-10-CM | POA: Diagnosis not present

## 2020-02-03 DIAGNOSIS — K5900 Constipation, unspecified: Secondary | ICD-10-CM

## 2020-02-03 DIAGNOSIS — K219 Gastro-esophageal reflux disease without esophagitis: Secondary | ICD-10-CM | POA: Diagnosis not present

## 2020-02-03 MED ORDER — LINACLOTIDE 145 MCG PO CAPS: 145.0000 ug | ORAL_CAPSULE | Freq: Every day | ORAL | 3 refills | Status: DC

## 2020-02-03 NOTE — Telephone Encounter (Signed)
Patient is going to check with her insurance to see if Dr. Suszanne Conners is in network with her insurance prior to referral being resent to them. She is also aware currently no ENT in [place]. She would have to go to [place].

## 2020-02-03 NOTE — Patient Instructions (Signed)
I have sent in Linzess again to the pharmacy. I am calling them as well to make sure you receive it. Take one capsule each morning, 30 minutes before breakfast.   Continue pantoprazole (Protonix) twice a day, 30 minutes before breakfast and dinner.   We will refer you again to ENT.  We have arranged an upper endoscopy with dilation in the near future by Dr. Jena Gauss.  We will see you in 3-4 months!  Have a great New Year!  I enjoyed seeing you again today! As you know, I value our relationship and want to provide genuine, compassionate, and quality care. I welcome your feedback. If you receive a survey regarding your visit,  I greatly appreciate you taking time to fill this out. See you next time!  Gelene Mink, PhD, ANP-BC [facility]

## 2020-02-03 NOTE — Progress Notes (Signed)
Primary Care Physician:  [facility] Primary GI: Dr. Jena Gauss    Chief Complaint  Patient presents with  . Gastroesophageal Reflux    Very little but not as much. Little acid to come back up in throat  . Constipation    Does not see linzess in her medications that is [facility]    HPI:   Jennifer [name] is a 49 y.o. female presenting today with a history of constipation and GERD, s/p hemorrhoid banding2019.GES normal in the past. EGD and colonoscopy on file from July 2020.   Linzess 145 has worked well for her historically. This was not included in her med pack at last visit, so I resent to her pharmacy. This is still not included in her med pack, for no reason. Feels bloated.    Felt to have element of LPR. Referred to ENT due to vocal hoarseness. Missed appt. Requesting to have rescheduled here for [place]. Has a globus sensation. Has occasional solid food dysphagia. When drinking, feels like it's getting hung. Last EGD July 2020 with erosive reflux esophagitis and dilation, which she responded well to.   Stays tired all the time. Falling asleep easily. Using walker recently. Underwent injections in knees. Feels strength is giving out. Legs giving out easily.    Past Medical History:  Diagnosis Date  . Allergy   . Anemia   . Anxiety   . Arthritis    neck  . Asthma   . Chronic kidney disease, stage 2 (mild) 01/21/2020  . Diabetes mellitus without complication (HCC)   . GERD (gastroesophageal reflux disease)   . History of flexible sigmoidoscopy 1999   Normal to 60cm,  . Hypertension   . Neuromuscular disorder (HCC)   . Ovarian cancer (HCC)    skin cancer  . Panic attack   . PTSD (post-traumatic stress disorder)   . S/P endoscopy Feb 2012   Nl, s/p 56-[facility] dilator, biopsy benign  . S/P endoscopy 2008   Mild gastritis  . Seizures (HCC)   . Substance abuse (HCC)    drug addiction    Past Surgical History:  Procedure  Laterality Date  . ABDOMINAL HYSTERECTOMY     ovarian tumors  . CHOLECYSTECTOMY    . COLONOSCOPY  03/01/2011   Rourk-External hemorrhoids/otherwise normal rectum and colon.  . COLONOSCOPY WITH PROPOFOL N/A 08/08/2018   entire examined colon is normal. The distal rectum and anal verge are normal on retroflexion view. Repeat in 5 years.   . ESOPHAGOGASTRODUODENOSCOPY  2008   gastritis  . ESOPHAGOGASTRODUODENOSCOPY  2012   Moderate sized hiatal hernia, non-H. pylori gastritis  . ESOPHAGOGASTRODUODENOSCOPY N/A 11/17/2013   RMR: Mild erosive reflux esophagitis. Patulous EG junction  . ESOPHAGOGASTRODUODENOSCOPY (EGD) WITH PROPOFOL N/A 08/08/2018   Erosive reflux esophagitis. Dilated. Small hiatal hernia. Otherwise normal.   . HERNIA REPAIR     Incisional with mesh  . KNEE ARTHROSCOPY     left knee surgery in [place]  . MALONEY DILATION  08/08/2018   Procedure: MALONEY DILATION;  Surgeon: Corbin Ade, MD;  [facility]: [facility];  Service: Endoscopy;;  . PARTIAL HYSTERECTOMY    . SKIN CANCER EXCISION     left bicep  . TONSILLECTOMY      Current Outpatient Medications  Medication Sig Dispense Refill  . cetirizine (ZYRTEC) 10 MG tablet Take 1 tablet (10 mg total) by mouth every morning. 90 tablet 3  . EPINEPHrine 0.3 mg/0.3 mL IJ SOAJ  injection Inject 0.3 mg into the muscle as needed for anaphylaxis.    . fluticasone (FLONASE) 50 MCG/ACT nasal spray Place 1 spray into both nostrils daily. 16 g 11  . gabapentin (NEURONTIN) 300 MG capsule Take 300 mg by mouth daily.    Marland Kitchen lamoTRIgine (LAMICTAL) 150 MG tablet Take 1 tablet (150 mg total) by mouth daily. (Patient taking differently: Take 200 mg by mouth daily.) 28 tablet 2  . Levetiracetam 750 MG TB24 Take 3 tablets (2,250 mg total) by mouth at bedtime. 270 tablet 4  . methocarbamol (ROBAXIN) 750 MG tablet Take 750 mg by mouth 2 (two) times daily as needed.    . pantoprazole (PROTONIX) 40 MG tablet Take 40 mg by mouth 2 (two) times daily.     . propranolol (INDERAL) 10 MG tablet Take 10 mg by mouth 2 (two) times daily.    . traZODone (DESYREL) 100 MG tablet TAKE 1/2 TO 1 TABLET AT BEDTIME AS NEEDED FOR SLEEP. (Patient taking differently: Take 100 mg by mouth at bedtime as needed. TAKE 1/2 TO 1 TABLET AT BEDTIME AS NEEDED FOR SLEEP.) 28 tablet 2  . Vitamin D, Ergocalciferol, (DRISDOL) 1.25 MG (50000 UT) CAPS capsule Take 1 capsule (50,000 Units total) by mouth every 7 (seven) days. 5 capsule 11  . linaclotide (LINZESS) 145 MCG CAPS capsule Take 1 capsule (145 mcg total) by mouth daily before breakfast. 90 capsule 3   No current facility-administered medications for this visit.    Allergies as of 02/03/2020 - Review Complete 01/21/2020  Allergen Reaction Noted  . Metrizamide Rash and Swelling 10/17/2010  . Shellfish allergy Swelling 10/29/2013  . Amoxapine Nausea Only 10/01/2019  . Amoxapine and related  07/12/2018  . Codeine Other (See Comments)   . Hydrocodone Itching 12/03/2012  . Ivp dye [iodinated diagnostic agents] Rash 10/17/2010  . Latex Rash 03/08/2010    Family History  Problem Relation Age of Onset  . Crohn's disease Maternal Aunt   . Breast cancer Maternal Grandmother   . Cancer Maternal Grandmother   . Colon cancer Maternal Grandfather        40  . Pancreatic cancer Maternal Grandfather   . Cancer Maternal Grandfather   . Crohn's disease Cousin        x 2 cousins  . Breast cancer Daughter 60  . Arthritis Mother   . COPD Mother   . Depression Mother   . Drug abuse Mother   . Heart disease Mother   . Hypertension Mother   . Hyperlipidemia Mother   . Learning disabilities Mother   . Mental illness Mother   . Alcohol abuse Mother   . Colon polyps Mother 39       tubular adenoma  . Early death Father 64       murder  . Alcohol abuse Father   . Drug abuse Father   . Learning disabilities Father   . Cancer Paternal Grandmother   . Alcohol abuse Paternal Grandmother   . Cancer Paternal Grandfather    . Alcohol abuse Maternal Uncle   . Alcohol abuse Paternal Uncle   . Drug abuse Paternal Uncle     Social History   Socioeconomic History  . Marital status: Married    Spouse name: [name]  . Number of children: Not on file  . Years of education: Not on file  . Highest education level: 11th grade  Occupational History  . Occupation: disabled    Fish farm manager: NOT EMPLOYED  Tobacco Use  .  Smoking status: Former Smoker    Packs/day: 1.50    Years: 26.00    Pack years: 39.00    Types: Cigarettes    Start date: 05/03/1984    Quit date: 11/21/2019    Years since quitting: 0.2  . Smokeless tobacco: Never Used  Vaping Use  . Vaping Use: Never used  Substance and Sexual Activity  . Alcohol use: Yes    Comment: occassionally  . Drug use: Yes    Types: Marijuana  . Sexual activity: Not Currently    Birth control/protection: Surgical  Other Topics Concern  . Not on file  Social History Narrative   Lives at home with her husband.   Right handed.    1-2 cups of coffee 3 times per wk.    Intermittent soda.   Social Determinants of Health   Financial Resource Strain: Low Risk   . Difficulty of Paying Living Expenses: Not hard at all  Food Insecurity: No Food Insecurity  . Worried About Charity fundraiser in the Last Year: Never true  . Ran Out of Food in the Last Year: Never true  Transportation Needs: No Transportation Needs  . Lack of Transportation (Medical): No  . Lack of Transportation (Non-Medical): No  Physical Activity: Inactive  . Days of Exercise per Week: 0 days  . Minutes of Exercise per Session: 0 min  Stress: Stress Concern Present  . Feeling of Stress : To some extent  Social Connections: Moderately Integrated  . Frequency of Communication with Friends and Family: More than three times a week  . Frequency of Social Gatherings with Friends and Family: More than three times a week  . Attends Religious Services: 1 to 4 times per year  . Active Member of Clubs or  Organizations: No  . Attends Archivist Meetings: Never  . Marital Status: Married    Review of Systems: Gen: Denies fever, chills, anorexia. Denies fatigue, weakness, weight loss.  CV: Denies chest pain, palpitations, syncope, peripheral edema, and claudication. Resp: Denies dyspnea at rest, cough, wheezing, coughing up blood, and pleurisy. GI: see HPI Derm: Denies rash, itching, dry skin Psych: Denies depression, anxiety, memory loss, confusion. No homicidal or suicidal ideation.  Heme: Denies bruising, bleeding, and enlarged lymph nodes.  Physical Exam: BP 111/71   Pulse 70   Temp 97.9 F (36.6 C)   Ht 5\' 6"  (1.676 m)   Wt 249 lb 3.2 oz (113 kg)   BMI 40.22 kg/m  General:   Alert and oriented. No distress noted. Pleasant and cooperative.  Head:  Normocephalic and atraumatic. Eyes:  Conjuctiva clear without scleral icterus. Mouth:  Mask in place Abdomen:  +BS, soft, non-tender and non-distended. No rebound or guarding. No HSM or masses noted. Msk:  Symmetrical without gross deformities. Normal posture. Extremities:  Without edema. Neurologic:  Alert and  oriented x4 Psych:  Alert and cooperative. Normal mood and affect.  ASSESSMENT: Jennifer [name] is a 49 y.o. female presenting today presenting with history of constipation, GERD, now with dysphagia that has responded well to dilation back in July 2020.   Constipation: I have personally contacted [place] and sent Linzess 145 mcg to pharmacy.   GERD: controlled with Protonix BID. Now with solid food dysphagia and globus sensation. Responded well to dilation in 2020. Will arrange EGD/dilation in near future. I have also referred to ENT due to hoarseness.    PLAN:  Proceed with upper endoscopy/dilation by Dr. Gala Romney in near future using  Propofol: the risks, benefits, and alternatives have been discussed with the patient in detail. The patient states understanding and desires to proceed.   Continue  Protonix BID  Linzess 145 mcg daily  ENT referral  Return in 3-4 months  Annitta Needs, PhD, [facility]

## 2020-02-05 NOTE — Progress Notes (Signed)
Cc'ed to pcp °

## 2020-02-17 ENCOUNTER — Other Ambulatory Visit: Payer: Self-pay | Admitting: Family Medicine

## 2020-02-17 ENCOUNTER — Encounter: Payer: Self-pay | Admitting: *Deleted

## 2020-02-17 ENCOUNTER — Ambulatory Visit (INDEPENDENT_AMBULATORY_CARE_PROVIDER_SITE_OTHER): Payer: 59 | Admitting: Family Medicine

## 2020-02-17 ENCOUNTER — Other Ambulatory Visit: Payer: Self-pay | Admitting: *Deleted

## 2020-02-17 ENCOUNTER — Other Ambulatory Visit: Payer: Self-pay

## 2020-02-17 ENCOUNTER — Ambulatory Visit (INDEPENDENT_AMBULATORY_CARE_PROVIDER_SITE_OTHER): Payer: 59

## 2020-02-17 ENCOUNTER — Telehealth: Payer: Self-pay | Admitting: Family Medicine

## 2020-02-17 ENCOUNTER — Encounter: Payer: Self-pay | Admitting: Family Medicine

## 2020-02-17 VITALS — BP 100/69 | HR 80 | Ht 66.0 in | Wt 251.8 lb

## 2020-02-17 DIAGNOSIS — R0600 Dyspnea, unspecified: Secondary | ICD-10-CM

## 2020-02-17 DIAGNOSIS — I10 Essential (primary) hypertension: Secondary | ICD-10-CM

## 2020-02-17 DIAGNOSIS — R29818 Other symptoms and signs involving the nervous system: Secondary | ICD-10-CM

## 2020-02-17 DIAGNOSIS — R0609 Other forms of dyspnea: Secondary | ICD-10-CM

## 2020-02-17 DIAGNOSIS — R55 Syncope and collapse: Secondary | ICD-10-CM | POA: Diagnosis not present

## 2020-02-17 DIAGNOSIS — R002 Palpitations: Secondary | ICD-10-CM

## 2020-02-17 DIAGNOSIS — R0789 Other chest pain: Secondary | ICD-10-CM

## 2020-02-17 MED ORDER — PROPRANOLOL HCL 10 MG PO TABS: 5.0000 mg | ORAL_TABLET | Freq: Two times a day (BID) | ORAL | Status: DC

## 2020-02-17 NOTE — Patient Instructions (Addendum)
Medication Instructions:   Decrease Propranolol to 5mg  twice a day.  Continue all other medications.    Labwork: none  Testing/Procedures:  Your physician has requested that you have an echocardiogram. Echocardiography is a painless test that uses sound waves to create images of your heart. It provides your doctor with information about the size and shape of your heart and how well your heart's chambers and valves are working. This procedure takes approximately one hour. There are no restrictions for this procedure - DONE AT [place]   Your physician has requested that you have a lexiscan myoview. For further information please visit [email or web address]. Please follow instruction sheet, as given.  Your physician has recommended that you wear a 14 day event monitor. Event monitors are medical devices that record the heart's electrical activity. Doctors most often [place] these monitors to diagnose arrhythmias. Arrhythmias are problems with the speed or rhythm of the heartbeat. The monitor is a small, portable device. You can wear one while you do your normal daily activities. This is usually used to diagnose what is causing palpitations/syncope (passing out).  Office will contact with results via phone or letter.    Follow-Up:  6 weeks   Any Other Special Instructions Will Be Listed Below (If Applicable). You have been referred to:  [facility]  If you need a refill on your cardiac medications before your next appointment, please call your pharmacy.

## 2020-02-17 NOTE — Telephone Encounter (Signed)
Pre-cert Verification for the following procedure    LEXISCAN   DATE:  03/01/2020  [place]   14 DAY ZIO MONITOR

## 2020-02-17 NOTE — Progress Notes (Addendum)
Cardiology Office Note  Date: 02/17/2020   ID: Jennifer [name], DOB 1970/02/19, MRN [id number]  PCP:  Monico Blitz, MD  Cardiologist:  No primary care provider on file. Electrophysiologist:  None   Chief Complaint: Syncope History of Present Illness: Jennifer [name] is a 50 y.o. female with a history of hypertension, CKD, DM 2, GERD, neuromuscular disorder, history of ovarian cancer, PTSD, seizures, substance abuse.  Last encounter with Dr. Bronson Ing 04/18/2019.  She was being seen in follow-up after undergoing cardiovascular testing for evaluation of chest pain.  Nuclear stress test on November 20, 2018 was low risk of soft tissue attenuation and no clear evidence of ischemia.  EF 53%.  History of COPD and tobacco abuse.  History of palpitations and anxiety.  She was smoking about 4 cigarettes/day.  She was having shortness of breath and wheezing which her husband noticed.  Occasionally having sharp chest pain lasting 1 second when taking a deep breath.  Chest pain symptoms were determined to be atypical and noncardiac due to normal stress test.  She was encouraged cessation of smoking.  He recommended her contacting her PCP for finding a new pulmonologist since her pulmonologist Dr. Luan Pulling had retired.  She is here after being referred by primary care for recent syncopal episodes.  Recently saw PCP and apparently echocardiogram and cardiac monitor were ordered.  It appears the echo was done yesterday but we do not have the results as of this time.  She reports feeling rapid heart rates at times.  States sometimes when she turns her head she feels dizzy at other times when she is going from a lying to standing position she feels dizzy.  Her blood pressure initially was one hundred systolic on arrival.  Orthostatic vital signs were checked.  Initial blood pressure when lying was 95/61 with a heart rate of seventy-six.  Sitting blood pressure 112/78 with a heart rate of eighty.  Standing blood  pressure 107/76 heart rate of eighty-seven.  Standing for 1 minute blood pressure 100/69 with a heart rate of eighty.  Patient denied any sensation of near syncope.  Patient states she has not a significant amount of snoring per her husband.  She states she is having some dyspnea on exertion and recently an echocardiogram at [place].  Patient has been having some chest tightness when ambulating and on exertional activities relieved with rest.  She has significant arthritis with bad knees and had previous left knee surgery.  She is using a rolling walker to ambulate today.   Past Medical History:  Diagnosis Date  . Allergy   . Anemia   . Anxiety   . Arthritis    neck  . Asthma   . Chronic kidney disease, stage 2 (mild) 01/21/2020  . Diabetes mellitus without complication ([place])   . GERD (gastroesophageal reflux disease)   . History of flexible sigmoidoscopy 1999   Normal to 60cm,  . Hypertension   . Neuromuscular disorder ([place])   . Ovarian cancer ([place])    skin cancer  . Panic attack   . PTSD (post-traumatic stress disorder)   . S/P endoscopy Feb 2012   Nl, s/p 56-[facility] dilator, biopsy benign  . S/P endoscopy 2008   Mild gastritis  . Seizures ([place])   . Substance abuse ([place])    drug addiction    Past Surgical History:  Procedure Laterality Date  . ABDOMINAL HYSTERECTOMY     ovarian tumors  . CHOLECYSTECTOMY    .  COLONOSCOPY  03/01/2011   Rourk-External hemorrhoids/otherwise normal rectum and colon.  . COLONOSCOPY WITH PROPOFOL N/A 08/08/2018   entire examined colon is normal. The distal rectum and anal verge are normal on retroflexion view. Repeat in 5 years.   . ESOPHAGOGASTRODUODENOSCOPY  2008   gastritis  . ESOPHAGOGASTRODUODENOSCOPY  2012   Moderate sized hiatal hernia, non-H. pylori gastritis  . ESOPHAGOGASTRODUODENOSCOPY N/A 11/17/2013   RMR: Mild erosive reflux esophagitis. Patulous EG junction  . ESOPHAGOGASTRODUODENOSCOPY (EGD) WITH PROPOFOL N/A 08/08/2018    Erosive reflux esophagitis. Dilated. Small hiatal hernia. Otherwise normal.   . HERNIA REPAIR     Incisional with mesh  . KNEE ARTHROSCOPY     left knee surgery in [place]  . MALONEY DILATION  08/08/2018   Procedure: MALONEY DILATION;  Surgeon: Daneil Dolin, MD;  Location: [facility];  Service: Endoscopy;;  . PARTIAL HYSTERECTOMY    . SKIN CANCER EXCISION     left bicep  . TONSILLECTOMY      Current Outpatient Medications  Medication Sig Dispense Refill  . cetirizine (ZYRTEC) 10 MG tablet Take 1 tablet (10 mg total) by mouth every morning. 90 tablet 3  . EPINEPHrine 0.3 mg/0.3 mL IJ SOAJ injection Inject 0.3 mg into the muscle as needed for anaphylaxis.    . fluticasone (FLONASE) 50 MCG/ACT nasal spray Place 1 spray into both nostrils daily. 16 g 11  . gabapentin (NEURONTIN) 300 MG capsule Take 300 mg by mouth 2 (two) times daily.    Marland Kitchen lamoTRIgine (LAMICTAL) 200 MG tablet Take 200 mg by mouth daily.    . Levetiracetam 750 MG TB24 Take 3 tablets (2,250 mg total) by mouth at bedtime. 270 tablet 4  . linaclotide (LINZESS) 145 MCG CAPS capsule Take 1 capsule (145 mcg total) by mouth daily before breakfast. 90 capsule 3  . methocarbamol (ROBAXIN) 750 MG tablet Take 750 mg by mouth 2 (two) times daily as needed.    . pantoprazole (PROTONIX) 40 MG tablet Take 40 mg by mouth 2 (two) times daily.    . traZODone (DESYREL) 100 MG tablet TAKE 1/2 TO 1 TABLET AT BEDTIME AS NEEDED FOR SLEEP. (Patient taking differently: Take 100 mg by mouth at bedtime as needed. TAKE 1/2 TO 1 TABLET AT BEDTIME AS NEEDED FOR SLEEP.) 28 tablet 2  . Vitamin D, Ergocalciferol, (DRISDOL) 1.25 MG (50000 UT) CAPS capsule Take 1 capsule (50,000 Units total) by mouth every 7 (seven) days. 5 capsule 11  . propranolol (INDERAL) 10 MG tablet Take 0.5 tablets (5 mg total) by mouth 2 (two) times daily.     No current facility-administered medications for this visit.   Allergies:  Metrizamide, Shellfish allergy,  Amoxapine, Amoxapine and related, Codeine, Hydrocodone, Ivp dye [iodinated diagnostic agents], and Latex   Social History: The patient  reports that she quit smoking about 2 months ago. Her smoking use included cigarettes. She started smoking about 35 years ago. She has a 39.00 pack-year smoking history. She has never used smokeless tobacco. She reports current alcohol use. She reports current drug use. Drug: Marijuana.   Family History: The patient's family history includes Alcohol abuse in her father, maternal uncle, mother, paternal grandmother, and paternal uncle; Arthritis in her mother; Breast cancer in her maternal grandmother; Breast cancer (age of onset: 2) in her daughter; COPD in her mother; Cancer in her maternal grandfather, maternal grandmother, paternal grandfather, and paternal grandmother; Colon cancer in her maternal grandfather; Colon polyps (age of onset: 81) in her mother; Crohn's  disease in her cousin and maternal aunt; Depression in her mother; Drug abuse in her father, mother, and paternal uncle; Early death (age of onset: 69) in her father; Heart disease in her mother; Hyperlipidemia in her mother; Hypertension in her mother; Learning disabilities in her father and mother; Mental illness in her mother; Pancreatic cancer in her maternal grandfather.   ROS:  Please see the history of present illness. Otherwise, complete review of systems is positive for none.  All other systems are reviewed and negative.   Physical Exam: VS:  BP 100/69 (BP Location: Left Arm, Cuff Size: Large)   Pulse 80   Ht 5\' 6"  (1.676 m)   Wt 251 lb 12.8 oz (114.2 kg)   SpO2 99%   BMI 40.64 kg/m , BMI Body mass index is 40.64 kg/m.  Wt Readings from Last 3 Encounters:  02/17/20 251 lb 12.8 oz (114.2 kg)  02/03/20 249 lb 3.2 oz (113 kg)  01/21/20 237 lb (107.5 kg)    General: Morbidly obese patient appears comfortable at rest. Neck: Supple, no elevated JVP or carotid bruits, no thyromegaly. Lungs:  Clear to auscultation, nonlabored breathing at rest. Cardiac: Regular rate and rhythm, no S3 or significant systolic murmur, no pericardial rub. Extremities: No pitting edema, distal pulses 2+. Skin: Warm and dry. Musculoskeletal: No kyphosis. Neuropsychiatric: Alert and oriented x3, affect grossly appropriate.  ECG: 02/17/2020 sinus rhythm with rate of seventy-eight  Recent Labwork: No results found for requested labs within last 8760 hours.     Component Value Date/Time   CHOL 172 02/07/2018 1011   TRIG 74 02/07/2018 1011   HDL 49 02/07/2018 1011   CHOLHDL 3.5 02/07/2018 1011   CHOLHDL 3.4 10/12/2016 1517   LDLCALC 108 (H) 02/07/2018 1011   LDLCALC 96 10/12/2016 1517    Other Studies Reviewed Today:  Nuclear stress test 11/20/2018  Narrative & Impression   No diagnostic ST changes to indicate ischemia.  Small, mild intensity, fixed apical defect most consistent with soft tissue attenuation. No clear evidence of ischemia.  This is a low risk study.  Nuclear stress EF: 53%.       Assessment and Plan:  1. Primary hypertension   2. Syncope, unspecified syncope type   3. Suspected sleep apnea   4. DOE (dyspnea on exertion)   5. Chest discomfort    1. Primary hypertension Blood pressure is running on the low side and with recent syncopal episodes.  We are decreasing her propranolol to 5 mg p.o. twice daily for now due to low blood pressures.  2. Syncope, unspecified syncope type Patient's had a few episodes of syncope recently.  Blood pressure is in the low one hundred range today.  We performed orthostatic vital signs which showed no significant change and either blood pressures or heart rates.  States she feels her heart racing at times.  She denies any lightheadedness, dizziness, presyncope today.  Decrease propranolol to 5 mg p.o. twice daily for now.  Please get a 14-day ZIO monitor to assess for palpitations or arrhythmias.   3. Suspected sleep apnea Patient  states she has a significant amount of snoring and states her husband complains about the snoring.  Please refer to [facility] sleep center for sleep study for suspected sleep apnea.  4. DOE (dyspnea on exertion) Recent issues with increasing dyspnea on exertion.  Her primary care provider ordered an echocardiogram which was performed yesterday.  We do not have those results.  5. Chest discomfort Complaining of chest tightness  when performing ADLs or increased exertional activity.  Please get a Lexiscan stress test.  Medication Adjustments/Labs and Tests Ordered: Current medicines are reviewed at length with the patient today.  Concerns regarding medicines are outlined above.   Disposition: Follow-up with Dr. Harl Bowie or APP 6 weeks  Signed, Levell July, NP 02/17/2020 3:22 PM    [facility] [place], [place], [place] [address] Phone: [phone]; Fax: [phone]

## 2020-02-20 NOTE — Patient Instructions (Signed)
DAMBER BARWICK  02/20/2020     @PREFPERIOPPHARMACY @   Your procedure is scheduled on  02/26/2020.  Report to [facility] at  1:00 pm   Call this number if you have problems the morning of surgery:  [phone]   Remember:  Follow the diet and prep insttuctions given to you from the office.                      Take these medicines the morning of surgery with A SIP OF WATER zyrtec, gabapentin, robaxin(if needed), zofran(if needed), protnix, propranolol.    Do not wear jewelry, make-up or nail polish.  Do not wear lotions, powders, or perfumes, or deodorant. Please brush your teeth.  Do not shave 48 hours prior to surgery.  Men may shave face and neck.  Do not bring valuables to the hospital.  [facility] is not responsible for any belongings or valuables.  Contacts, dentures or bridgework may not be worn into surgery.  Leave your suitcase in the car.  After surgery it may be brought to your room.  For patients admitted to the hospital, discharge time will be determined by your treatment team.  Patients discharged the day of surgery will not be allowed to drive home.   Name and phone number of your driver:   Family   Special instructions:  DO NOT smoke the morning of your procedure.    Please read over the following fact sheets that you were given. Anesthesia Post-op Instructions and Care and Recovery After Surgery       Upper Endoscopy, Adult, Care After This sheet gives you information about how to care for yourself after your procedure. Your health care provider may also give you more specific instructions. If you have problems or questions, contact your health care provider. What can I expect after the procedure? After the procedure, it is common to have:  A sore throat.  Mild stomach pain or discomfort.  Bloating.  Nausea. Follow these instructions at home:  Follow instructions from your health care provider about what to eat or drink after  your procedure.  Return to your normal activities as told by your health care provider. Ask your health care provider what activities are safe for you.  Take over-the-counter and prescription medicines only as told by your health care provider.  If you were given a sedative during the procedure, it can affect you for several hours. Do not drive or operate machinery until your health care provider says that it is safe.  Keep all follow-up visits as told by your health care provider. This is important.   Contact a health care provider if you have:  A sore throat that lasts longer than one day.  Trouble swallowing. Get help right away if:  You vomit blood or your vomit looks like coffee grounds.  You have: ? A fever. ? Bloody, black, or tarry stools. ? A severe sore throat or you cannot swallow. ? Difficulty breathing. ? Severe pain in your chest or abdomen. Summary  After the procedure, it is common to have a sore throat, mild stomach discomfort, bloating, and nausea.  If you were given a sedative during the procedure, it can affect you for several hours. Do not drive or operate machinery until your health care provider says that it is safe.  Follow instructions from your health care provider about what to eat or drink after your procedure.  Return to your normal activities as told by your health care provider. This information is not intended to replace advice given to you by your health care provider. Make sure you discuss any questions you have with your health care provider. Document Revised: 01/21/2019 Document Reviewed: 06/25/2017 Elsevier Patient Education  2021 [place].  https://www.asge.org/home/for-patients/patient-information/understanding-eso-dilation-updated">  Esophageal Dilatation Esophageal dilatation, also called esophageal dilation, is a procedure to widen or open a blocked or narrowed part of the esophagus. The esophagus is the part of the body that moves  food and liquid from the mouth to the stomach. You may need this procedure if:  You have a buildup of scar tissue in your esophagus that makes it difficult, painful, or impossible to swallow. This can be caused by gastroesophageal reflux disease (GERD).  You have cancer of the esophagus.  There is a problem with how food moves through your esophagus. In some cases, you may need this procedure repeated at a later time to dilate the esophagus gradually. Tell a health care provider about:  Any allergies you have.  All medicines you are taking, including vitamins, herbs, eye drops, creams, and over-the-counter medicines.  Any problems you or family members have had with anesthetic medicines.  Any blood disorders you have.  Any surgeries you have had.  Any medical conditions you have.  Any antibiotic medicines you are required to take before dental procedures.  Whether you are pregnant or may be pregnant. What are the risks? Generally, this is a safe procedure. However, problems may occur, including:  Bleeding due to a tear in the lining of the esophagus.  A hole, or perforation, in the esophagus. What happens before the procedure?  Ask your health care provider about: ? Changing or stopping your regular medicines. This is especially important if you are taking diabetes medicines or blood thinners. ? Taking medicines such as aspirin and ibuprofen. These medicines can thin your blood. Do not take these medicines unless your health care provider tells you to take them. ? Taking over-the-counter medicines, vitamins, herbs, and supplements.  Follow instructions from your health care provider about eating or drinking restrictions.  Plan to have a responsible adult take you home from the hospital or clinic.  Plan to have a responsible adult care for you for the time you are told after you leave the hospital or clinic. This is important. What happens during the procedure?  You may be  given a medicine to help you relax (sedative).  A numbing medicine may be sprayed into the back of your throat, or you may gargle the medicine.  Your health care provider may perform the dilatation using various surgical instruments, such as: ? Simple dilators. This instrument is carefully placed in the esophagus to stretch it. ? Guided wire bougies. This involves using an endoscope to insert a wire into the esophagus. A dilator is passed over this wire to enlarge the esophagus. Then the wire is removed. ? Balloon dilators. An endoscope with a small balloon is inserted into the esophagus. The balloon is inflated to stretch the esophagus and open it up. The procedure may vary among health care providers and hospitals. What can I expect after the procedure?  Your blood pressure, heart rate, breathing rate, and blood oxygen level will be monitored until you leave the hospital or clinic.  Your throat may feel slightly sore and numb. This will get better over time.  You will not be allowed to eat or drink until your throat is no  longer numb.  When you are able to drink, urinate, and sit on the edge of the bed without nausea or dizziness, you may be able to return home. Follow these instructions at home:  Take over-the-counter and prescription medicines only as told by your health care provider.  If you were given a sedative during the procedure, it can affect you for several hours. Do not drive or operate machinery until your health care provider says that it is safe.  Plan to have a responsible adult care for you for the time you are told. This is important.  Follow instructions from your health care provider about any eating or drinking restrictions.  Do not use any products that contain nicotine or tobacco, such as cigarettes, e-cigarettes, and chewing tobacco. If you need help quitting, ask your health care provider.  Keep all follow-up visits. This is important. Contact a health care  provider if:  You have a fever.  You have pain that is not relieved by medicine. Get help right away if:  You have chest pain.  You have trouble breathing.  You have trouble swallowing.  You vomit blood.  You have black, tarry, or bloody stools. These symptoms may represent a serious problem that is an emergency. Do not wait to see if the symptoms will go away. Get medical help right away. Call your local emergency services (911 in the [place] Do not drive yourself to the hospital. Summary  Esophageal dilatation, also called esophageal dilation, is a procedure to widen or open a blocked or narrowed part of the esophagus.  Plan to have a responsible adult take you home from the hospital or clinic.  For this procedure, a numbing medicine may be sprayed into the back of your throat, or you may gargle the medicine.  Do not drive or operate machinery until your health care provider says that it is safe. This information is not intended to replace advice given to you by your health care provider. Make sure you discuss any questions you have with your health care provider. Document Revised: 06/11/2019 Document Reviewed: 06/11/2019 Elsevier Patient Education  2021 [place] After This sheet gives you information about how to care for yourself after your procedure. Your health care provider may also give you more specific instructions. If you have problems or questions, contact your health care provider. What can I expect after the procedure? After the procedure, it is common to have:  Tiredness.  Forgetfulness about what happened after the procedure.  Impaired judgment for important decisions.  Nausea or vomiting.  Some difficulty with balance. Follow these instructions at home: For the time period you were told by your health care provider:  Rest as needed.  Do not participate in activities where you could fall or become injured.  Do not  drive or use machinery.  Do not drink alcohol.  Do not take sleeping pills or medicines that cause drowsiness.  Do not make important decisions or sign legal documents.  Do not take care of children on your own.      Eating and drinking  Follow the diet that is recommended by your health care provider.  Drink enough fluid to keep your urine pale yellow.  If you vomit: ? Drink water, juice, or soup when you can drink without vomiting. ? Make sure you have little or no nausea before eating solid foods. General instructions  Have a responsible adult stay with you for the time you are told.  It is important to have someone help care for you until you are awake and alert.  Take over-the-counter and prescription medicines only as told by your health care provider.  If you have sleep apnea, surgery and certain medicines can increase your risk for breathing problems. Follow instructions from your health care provider about wearing your sleep device: ? Anytime you are sleeping, including during daytime naps. ? While taking prescription pain medicines, sleeping medicines, or medicines that make you drowsy.  Avoid smoking.  Keep all follow-up visits as told by your health care provider. This is important. Contact a health care provider if:  You keep feeling nauseous or you keep vomiting.  You feel light-headed.  You are still sleepy or having trouble with balance after 24 hours.  You develop a rash.  You have a fever.  You have redness or swelling around the IV site. Get help right away if:  You have trouble breathing.  You have new-onset confusion at home. Summary  For several hours after your procedure, you may feel tired. You may also be forgetful and have poor judgment.  Have a responsible adult stay with you for the time you are told. It is important to have someone help care for you until you are awake and alert.  Rest as told. Do not drive or operate machinery. Do  not drink alcohol or take sleeping pills.  Get help right away if you have trouble breathing, or if you suddenly become confused. This information is not intended to replace advice given to you by your health care provider. Make sure you discuss any questions you have with your health care provider. Document Revised: 10/09/2019 Document Reviewed: 12/26/2018 Elsevier Patient Education  2021 [employer].

## 2020-02-23 ENCOUNTER — Telehealth: Payer: Self-pay | Admitting: Family Medicine

## 2020-02-23 NOTE — Telephone Encounter (Signed)
Noted  

## 2020-02-23 NOTE — Telephone Encounter (Signed)
Pt has received her monitor and is having a procedure done this week on Thursday-- she's going to wait and start her monitor after her procedure.

## 2020-02-24 ENCOUNTER — Encounter (HOSPITAL_COMMUNITY)
Admission: RE | Admit: 2020-02-24 | Discharge: 2020-02-24 | Disposition: A | Discharge status: Home / Self Care | Payer: 59 | Source: Ambulatory Visit | Attending: Internal Medicine | Admitting: Internal Medicine

## 2020-02-24 ENCOUNTER — Encounter (HOSPITAL_COMMUNITY): Payer: Self-pay

## 2020-02-24 ENCOUNTER — Other Ambulatory Visit (HOSPITAL_COMMUNITY): Payer: 59

## 2020-02-25 ENCOUNTER — Telehealth: Payer: Self-pay | Admitting: *Deleted

## 2020-02-25 NOTE — Telephone Encounter (Signed)
FYI to Anna Boone 

## 2020-02-25 NOTE — Telephone Encounter (Signed)
-----   Message from Encarnacion Chu, RN sent at 02/20/2020  1:31 PM EST ----- Regarding: FW: stress test  ----- Message ----- From: Daneil Dolin, MD Sent: 02/20/2020   1:14 PM EST To: Encarnacion Chu, RN Subject: RE: stress test                                I think definitely EGD should be post-poned  ----- Message ----- From: Encarnacion Chu, RN Sent: 02/20/2020  12:50 PM EST To: Daneil Dolin, MD Subject: stress test                                    Hey Dr Gala Romney! I was reviewing [place] chart for her preop on Tuesday. She saw cardiology 02/13/2020 and they suggested that she needed a stress test due to chest discomfort. The stress test is scheduled for 03/01/2020. Do you think she needs to be postponed for her EGD after this is done? Just let me know. Thanks!

## 2020-02-25 NOTE — Telephone Encounter (Signed)
Spoke with patient. She was aware. Once cleared she can be rescheduled

## 2020-02-25 NOTE — Telephone Encounter (Signed)
I also called pt, VM full to make sure patient is aware. WCB

## 2020-02-25 NOTE — Telephone Encounter (Signed)
Noted  

## 2020-02-26 ENCOUNTER — Ambulatory Visit (HOSPITAL_COMMUNITY): Admission: RE | Admit: 2020-02-26 | Payer: 59 | Source: Home / Self Care | Admitting: Internal Medicine

## 2020-02-26 ENCOUNTER — Telehealth: Payer: Self-pay | Admitting: Family Medicine

## 2020-02-26 ENCOUNTER — Encounter (HOSPITAL_COMMUNITY): Admission: RE | Payer: Self-pay | Source: Home / Self Care

## 2020-02-26 DIAGNOSIS — R002 Palpitations: Secondary | ICD-10-CM

## 2020-02-26 SURGERY — ESOPHAGOGASTRODUODENOSCOPY (EGD) WITH PROPOFOL
Anesthesia: Monitor Anesthesia Care

## 2020-02-26 MED ORDER — PROPRANOLOL HCL 10 MG PO TABS: 10.0000 mg | ORAL_TABLET | Freq: Two times a day (BID) | ORAL | Status: DC

## 2020-02-26 NOTE — Telephone Encounter (Signed)
Patient wants sleep study transferred to Dr Merlene Laughter office for him or Jennifer [name] in his office on [address]

## 2020-02-26 NOTE — Telephone Encounter (Signed)
Patient notified

## 2020-02-26 NOTE — Telephone Encounter (Signed)
She received her monitor today , she put it on this afternoon, she wanted to let you know that as well.

## 2020-02-26 NOTE — Telephone Encounter (Signed)
Referral sent to Dr. Merlene Laughter on 02/17/2020.

## 2020-03-01 ENCOUNTER — Other Ambulatory Visit: Payer: Self-pay

## 2020-03-01 ENCOUNTER — Ambulatory Visit (HOSPITAL_COMMUNITY)
Admission: RE | Admit: 2020-03-01 | Discharge: 2020-03-01 | Disposition: A | Discharge status: Home / Self Care | Payer: 59 | Source: Ambulatory Visit | Attending: Family Medicine | Admitting: Family Medicine

## 2020-03-01 ENCOUNTER — Telehealth: Payer: Self-pay | Admitting: *Deleted

## 2020-03-01 ENCOUNTER — Encounter (HOSPITAL_COMMUNITY)
Admission: RE | Admit: 2020-03-01 | Discharge: 2020-03-01 | Disposition: A | Discharge status: Home / Self Care | Payer: 59 | Source: Ambulatory Visit | Attending: Family Medicine | Admitting: Family Medicine

## 2020-03-01 DIAGNOSIS — R0789 Other chest pain: Secondary | ICD-10-CM

## 2020-03-01 LAB — NM MYOCAR MULTI W/SPECT W/WALL MOTION / EF
LV dias vol: 88 mL (ref 46–106)
LV sys vol: 27 mL
Peak HR: 108 {beats}/min
RATE: 0.41
Rest HR: 64 {beats}/min
SDS: 5
SRS: 1
SSS: 6
TID: 1.23

## 2020-03-01 MED ORDER — REGADENOSON 0.4 MG/5ML IV SOLN
INTRAVENOUS | Status: AC
  Administered 2020-03-01: 0.4 mg via INTRAVENOUS
  Filled 2020-03-01: qty 5

## 2020-03-01 MED ORDER — TECHNETIUM TC 99M TETROFOSMIN IV KIT
10.0000 | PACK | Freq: Once | INTRAVENOUS | Status: AC | PRN
  Administered 2020-03-01: 9 via INTRAVENOUS

## 2020-03-01 MED ORDER — TECHNETIUM TC 99M TETROFOSMIN IV KIT
30.0000 | PACK | Freq: Once | INTRAVENOUS | Status: AC | PRN
  Administered 2020-03-01: 28 via INTRAVENOUS

## 2020-03-01 MED ORDER — SODIUM CHLORIDE FLUSH 0.9 % IV SOLN
INTRAVENOUS | Status: AC
  Administered 2020-03-01: 10 mL via INTRAVENOUS
  Filled 2020-03-01: qty 10

## 2020-03-01 NOTE — Telephone Encounter (Signed)
Patient called back returning call to [facility]

## 2020-03-01 NOTE — Telephone Encounter (Signed)
Mailbox full

## 2020-03-01 NOTE — Telephone Encounter (Signed)
-----   Message from Verta Ellen., NP sent at 03/01/2020  2:12 PM EST ----- Please call the patient and let her know the stress test did not show any evidence of ischemia.  It was considered a low risk stress test.  Thank you

## 2020-03-01 NOTE — Telephone Encounter (Signed)
Laurine Blazer, LPN  08/05/5282 1:32 PM EST Back to Top     Mailbox is full.

## 2020-03-01 NOTE — Telephone Encounter (Signed)
Laurine Blazer, LPN  07/27/3084 5:78 PM EST Back to Top     Notified, copy to pcp.

## 2020-03-11 ENCOUNTER — Telehealth: Payer: Self-pay | Admitting: Family Medicine

## 2020-03-11 NOTE — Telephone Encounter (Signed)
Patient called stating that she saw Dr. Merlene Laughter on 03/08/2020. States that Dr.[place] took her off of a medication and stated to her that she did not need a sleep study. Patient states that she had an echo on 02/16/2020 at [facility].

## 2020-03-12 ENCOUNTER — Encounter: Payer: Self-pay | Admitting: *Deleted

## 2020-03-12 NOTE — Telephone Encounter (Signed)
Pt says Dr [name] took her off trazodone and didn't think she needed a sleep study at this time - pt mailed back zio monitor yesterday and had echo done at [facility] and wanting results from echo (requested echo results)

## 2020-03-15 MED ORDER — PROPRANOLOL HCL 10 MG PO TABS: 5.0000 mg | ORAL_TABLET | Freq: Two times a day (BID) | ORAL | 1 refills | Status: DC

## 2020-03-15 NOTE — Telephone Encounter (Signed)
Echo results scanned into Epic

## 2020-03-15 NOTE — Telephone Encounter (Signed)
Pt is returning call.  

## 2020-03-15 NOTE — Telephone Encounter (Signed)
Echocardiogram showed normal pumping function.  No leaky valves.  Everything looked good.

## 2020-03-15 NOTE — Telephone Encounter (Signed)
Pt aware - will await monitor results

## 2020-03-25 ENCOUNTER — Telehealth: Payer: Self-pay | Admitting: *Deleted

## 2020-03-25 NOTE — Telephone Encounter (Signed)
Laurine Blazer, LPN  6/94/0982 8:67 PM EST Back to Top     Full mailbox.

## 2020-03-25 NOTE — Telephone Encounter (Signed)
Laurine Blazer, LPN  0/93/8182 9:93 PM EST Back to Top     Notified, copy to pcp.

## 2020-03-25 NOTE — Telephone Encounter (Signed)
-----   Message from Verta Ellen., NP sent at 03/22/2020  3:30 PM EST ----- Please call patient let her know there are no significant arrhythmias seen on her monitor.  She had some rare PACs and PVCs.

## 2020-03-29 NOTE — Progress Notes (Signed)
Cardiology Office Note  Date: 03/30/2020   ID: Jennifer [name], DOB 1970-11-02, MRN [id number]  PCP:  Monico Blitz, MD  Cardiologist:  No primary care provider on file. Electrophysiologist:  None   Chief Complaint: Syncope   History of Present Illness: Jennifer [name] is a 50 y.o. female with a history of hypertension, CKD, DM 2, GERD, neuromuscular disorder, history of ovarian cancer, PTSD, seizures, substance abuse.  Last encounter with Dr. Bronson Ing 04/18/2019.  Nuclear stress test on November 20, 2018 was low risk of soft tissue attenuation and no clear evidence of ischemia.  EF 53%.  History of COPD and tobacco abuse.  History of palpitations and anxiety.  She was smoking about 4 cigarettes/day.  She was having shortness of breath and wheezing which her husband noticed.  She was encouraged cessation of smoking. Dr Bronson Ing recommended her contacting her PCP for finding a new pulmonologist since her pulmonologist Dr. Luan Pulling had retired.  Referred later by primary care for syncopal episodes. She reported feeling rapid heart rates at times.  Stated sometimes when turning her head she felt dizzy.  At other times when going from a lying to standing position she felt dizzy.Marland KitchenHaving some dyspnea on exertion and  an echocardiogram at [facility].  Had been having some chest tightness when ambulating and on exertional activities relieved with rest. Significant arthritis with bad knees and had previous left knee surgery.  She was using a rolling walker to ambulate.   She is here today for review of recent diagnostic studies. Stress test on March 01, 2020 was considered a low risk study.  Ejection fraction 55 to 65%.  Small very mild anterior defect which was thought to be due to mild differences in breast attenuation.  Could not exclude very mild anterior ischemia.  Report stated either finding would support low risk.  She had a ZIO monitor 14-day which showed no significant arrhythmias or  pauses.  Heart rate ranging from 55 beats up to 149 bpm.  Average heart rate 88 bpm.  Rare PACs rare PVCs.  Echocardiogram from PCP office demonstrated normal LV function with EF of 68.79%.  No diastolic dysfunction.  No valvular abnormalities were noted.  No regional wall motion abnormalities.    Continues to complain of shortness of breath and fatigue.  Husband states she has significant issues with snoring, periods of apnea, excessive daytime sleepiness.  She has a history of smoking.  She has some limited mobility due to arthritis and surgeries on her knee.  She uses a walker to ambulate.  She previously saw Dr. Luan Pulling pulmonology in the past.   Past Medical History:  Diagnosis Date  . Allergy   . Anemia   . Anxiety   . Arthritis    neck  . Asthma   . Chronic kidney disease, stage 2 (mild) 01/21/2020  . Diabetes mellitus without complication ([place])   . GERD (gastroesophageal reflux disease)   . History of flexible sigmoidoscopy 1999   Normal to 60cm,  . Hypertension   . Neuromuscular disorder ([place])   . Ovarian cancer ([place])    skin cancer  . Panic attack   . PTSD (post-traumatic stress disorder)   . S/P endoscopy Feb 2012   Nl, s/p [facility] dilator, biopsy benign  . S/P endoscopy 2008   Mild gastritis  . Seizures ([place])   . Substance abuse ([place])    drug addiction    Past Surgical History:  Procedure Laterality Date  . ABDOMINAL  HYSTERECTOMY     ovarian tumors  . CHOLECYSTECTOMY    . COLONOSCOPY  03/01/2011   [place]-External hemorrhoids/otherwise normal rectum and colon.  . COLONOSCOPY WITH PROPOFOL N/A 08/08/2018   entire examined colon is normal. The distal rectum and anal verge are normal on retroflexion view. Repeat in 5 years.   . ESOPHAGOGASTRODUODENOSCOPY  2008   gastritis  . ESOPHAGOGASTRODUODENOSCOPY  2012   Moderate sized hiatal hernia, non-H. pylori gastritis  . ESOPHAGOGASTRODUODENOSCOPY N/A 11/17/2013   RMR: Mild erosive reflux esophagitis. Patulous  EG junction  . ESOPHAGOGASTRODUODENOSCOPY (EGD) WITH PROPOFOL N/A 08/08/2018   Erosive reflux esophagitis. Dilated. Small hiatal hernia. Otherwise normal.   . HERNIA REPAIR     Incisional with mesh  . KNEE ARTHROSCOPY     left knee surgery in [place]  . MALONEY DILATION  08/08/2018   Procedure: MALONEY DILATION;  Surgeon: Daneil Dolin, MD;  Location: [facility];  Service: Endoscopy;;  . PARTIAL HYSTERECTOMY    . SKIN CANCER EXCISION     left bicep  . TONSILLECTOMY      Current Outpatient Medications  Medication Sig Dispense Refill  . carboxymethylcellul-glycerin (REFRESH RELIEVA) 0.5-0.9 % ophthalmic solution Place 1 drop into the left eye daily.    . cetirizine (ZYRTEC) 10 MG tablet Take 1 tablet (10 mg total) by mouth every morning. 90 tablet 3  . EPINEPHrine 0.15 MG/0.15ML IJ injection Inject 0.15 mg into the muscle as needed for anaphylaxis.    . fluorometholone (FML) 0.1 % ophthalmic suspension Place 1 drop into the left eye in the morning, at noon, and at bedtime.    . fluticasone (FLONASE) 50 MCG/ACT nasal spray Place 1 spray into both nostrils daily. (Patient taking differently: Place 1 spray into both nostrils 3 (three) times daily as needed for allergies.) 16 g 11  . gabapentin (NEURONTIN) 300 MG capsule Take 300 mg by mouth 2 (two) times daily.    Marland Kitchen lamoTRIgine (LAMICTAL) 200 MG tablet Take 200 mg by mouth daily.    . Levetiracetam 750 MG TB24 Take 3 tablets (2,250 mg total) by mouth at bedtime. 270 tablet 4  . linaclotide (LINZESS) 145 MCG CAPS capsule Take 1 capsule (145 mcg total) by mouth daily before breakfast. 90 capsule 3  . methocarbamol (ROBAXIN) 750 MG tablet Take 750 mg by mouth 2 (two) times daily as needed.    . ondansetron (ZOFRAN) 4 MG tablet Take 4 mg by mouth every 8 (eight) hours as needed for nausea or vomiting.    . pantoprazole (PROTONIX) 40 MG tablet Take 40 mg by mouth 2 (two) times daily.    . propranolol (INDERAL) 10 MG tablet Take 0.5 tablets (5  mg total) by mouth 2 (two) times daily. 90 tablet 1  . Vitamin D, Ergocalciferol, (DRISDOL) 1.25 MG (50000 UT) CAPS capsule Take 1 capsule (50,000 Units total) by mouth every 7 (seven) days. 5 capsule 11   No current facility-administered medications for this visit.   Allergies:  Metrizamide, Shellfish allergy, Amoxapine, Amoxapine and related, Codeine, Hydrocodone, Ivp dye [iodinated diagnostic agents], and Latex   Social History: The patient  reports that she quit smoking about 4 months ago. Her smoking use included cigarettes. She started smoking about 35 years ago. She has a 39.00 pack-year smoking history. She has never used smokeless tobacco. She reports current alcohol use. She reports current drug use. Drug: Marijuana.   Family History: The patient's family history includes Alcohol abuse in her father, maternal uncle, mother, paternal grandmother,  and paternal uncle; Arthritis in her mother; Breast cancer in her maternal grandmother; Breast cancer (age of onset: 20) in her daughter; COPD in her mother; Cancer in her maternal grandfather, maternal grandmother, paternal grandfather, and paternal grandmother; Colon cancer in her maternal grandfather; Colon polyps (age of onset: 35) in her mother; Crohn's disease in her cousin and maternal aunt; Depression in her mother; Drug abuse in her father, mother, and paternal uncle; Early death (age of onset: 27) in her father; Heart disease in her mother; Hyperlipidemia in her mother; Hypertension in her mother; Learning disabilities in her father and mother; Mental illness in her mother; Pancreatic cancer in her maternal grandfather.   ROS:  Please see the history of present illness. Otherwise, complete review of systems is positive for none.  All other systems are reviewed and negative.   Physical Exam: VS:  BP 116/82   Pulse 60   Ht 5\' 6"  (1.676 m)   Wt 251 lb 12.8 oz (114.2 kg)   SpO2 98%   BMI 40.64 kg/m , BMI Body mass index is 40.64  kg/m.  Wt Readings from Last 3 Encounters:  03/30/20 251 lb 12.8 oz (114.2 kg)  02/17/20 251 lb 12.8 oz (114.2 kg)  02/03/20 249 lb 3.2 oz (113 kg)    General: Morbidly obese patient appears comfortable at rest. Neck: Supple, no elevated JVP or carotid bruits, no thyromegaly. Lungs: Clear to auscultation, nonlabored breathing at rest. Cardiac: Regular rate and rhythm, no S3 or significant systolic murmur, no pericardial rub. Extremities: No pitting edema, distal pulses 2+. Skin: Warm and dry. Musculoskeletal: No kyphosis. Neuropsychiatric: Alert and oriented x3, affect grossly appropriate.  ECG: 02/17/2020 sinus rhythm with rate of seventy-eight  Recent Labwork: No results found for requested labs within last 8760 hours.     Component Value Date/Time   CHOL 172 02/07/2018 1011   TRIG 74 02/07/2018 1011   HDL 49 02/07/2018 1011   CHOLHDL 3.5 02/07/2018 1011   CHOLHDL 3.4 10/12/2016 1517   LDLCALC 108 (H) 02/07/2018 1011   LDLCALC 96 10/12/2016 1517    Other Studies Reviewed Today:   Recent echocardiogram performed at PCP office on 02/16/2020 for syncope and collapse: Left ventricular cavity size normal.  Normal systolic global function.  Calculated EF 68.79%.  Left atrium size is normal.  Right atrium size is normal.  Right ventricle size is normal.  Normal ascending aorta.  Aortic valve normal appearing valve and valve structure.  Mitral valve normal.  Tricuspid valve normal.  No pericardial effusion observed. Conclusion: LV cavity size normal.  Normal systolic global function of the left ventricle.  Calculated ventricle EF 68.7%.  Normal left ventricular dimensions systolic function.  No regional wall motion abnormalities.   Zio monitor 03/22/2020 Study Highlights  ZIO XT reviewed.  13 days 23 hours analyzed.  Predominant rhythm is sinus with heart rate ranging from 55 bpm up to 149 bpm and average heart rate 88 bpm.  Rare PACs were noted representing less than 1% total  beats, couplets also noted.  Rare PVCs were noted representing less than 1% total beats, couplets and triplets were also noted.  There were no sustained arrhythmias or pauses.    NST 03/01/2020 Study Result  Narrative & Impression   There was no ST segment deviation noted during stress.  This is a low risk study.  The left ventricular ejection fraction is normal (55-65%).  Small very mild anterior defect that likely represents mild differences in breast attenuation, cannot exclude  very mild anterior ischemia. Either finding would support low risk.       Nuclear stress test 11/20/2018 Narrative & Impression   No diagnostic ST changes to indicate ischemia.  Small, mild intensity, fixed apical defect most consistent with soft tissue attenuation. No clear evidence of ischemia.  This is a low risk study.  Nuclear stress EF: 53%.       Assessment and Plan:   1. Primary hypertension Blood pressure is better since decreasing propranolol.  BP today 116/82.  Continue propranolol to 5 mg p.o. twice daily.  2. Syncope, unspecified syncope type She denies any lightheadedness, dizziness, presyncope, or syncope today.  Decrease propranolol to 5 mg p.o. twice daily for now.  No significant arrhythmias on recent ZIO monitor.  Blood pressure 116/82 today.  3. Suspected sleep apnea Patient states she has a significant amount of snoring and states her husband complains about the snoring.  Please refer to pulmonology for evaluation evaluation and management of suspected sleep apnea.  4. DOE (dyspnea on exertion) Recent issues with increasing dyspnea on exertion.  Recent echocardiogram at PCP office showed normal LV function with EF of 68.7%.  No WMA's.  No evidence of diastolic dysfunction.  No valvular abnormalities noted.  History of smoking in has seen pulmonology in the past we are referring her to pulmonology for evaluation of suspected sleep apnea.  She likely needs PFTs to assess  pulmonary function.  History of smoking.,  Syncope, chest discomfort  5. Chest discomfort Complaining of chest tightness when performing ADLs or increased exertional activity.  Recent Lexiscan stress test was considered low risk.   Medication Adjustments/Labs and Tests Ordered: Current medicines are reviewed at length with the patient today.  Concerns regarding medicines are outlined above.   Disposition: Follow-up with Dr. Harl Bowie or APP 6 weeks  Signed, Levell July, NP 03/30/2020 3:26 PM    [facility], [place], [place] [address] Phone: [phone]; Fax: [phone]

## 2020-03-30 ENCOUNTER — Encounter: Payer: Self-pay | Admitting: Family Medicine

## 2020-03-30 ENCOUNTER — Ambulatory Visit (INDEPENDENT_AMBULATORY_CARE_PROVIDER_SITE_OTHER): Payer: 59 | Admitting: Family Medicine

## 2020-03-30 VITALS — BP 116/82 | HR 60 | Ht 66.0 in | Wt 251.8 lb

## 2020-03-30 DIAGNOSIS — R29818 Other symptoms and signs involving the nervous system: Secondary | ICD-10-CM

## 2020-03-30 DIAGNOSIS — R0602 Shortness of breath: Secondary | ICD-10-CM | POA: Diagnosis not present

## 2020-03-30 NOTE — Patient Instructions (Signed)
Medication Instructions:   Your physician recommends that you continue on your current medications as directed. Please refer to the Current Medication list given to you today.  Labwork:  none  Testing/Procedures:  none  Follow-Up:  Your physician recommends that you schedule a follow-up appointment in: as needed.  Any Other Special Instructions Will Be Listed Below (If Applicable).  You have been referred to Pulmonology  If you need a refill on your cardiac medications before your next appointment, please call your pharmacy.

## 2020-03-31 ENCOUNTER — Encounter: Payer: Self-pay | Admitting: Family Medicine

## 2020-03-31 ENCOUNTER — Telehealth: Payer: Self-pay | Admitting: Family Medicine

## 2020-03-31 NOTE — Telephone Encounter (Signed)
Notes faxed to [facility]

## 2020-04-14 ENCOUNTER — Other Ambulatory Visit: Payer: Self-pay | Admitting: Nurse Practitioner

## 2020-04-19 ENCOUNTER — Telehealth: Payer: Self-pay

## 2020-04-19 NOTE — Telephone Encounter (Signed)
VM received 04/19/2020. Pt would like a call back at [phone].

## 2020-04-23 NOTE — Telephone Encounter (Signed)
Spoke with pt. Discussed trying a Linzess rebate card and or pt assistance for Linzess since pts insurance doesn't want to cover it. Pt assistance paperwork and Linzess samples are ready for pickup. Pt is aware that she has to submit a financial document with the pt assistance paperwork.

## 2020-04-23 NOTE — Telephone Encounter (Signed)
Called pt, VM was full this time.

## 2020-04-23 NOTE — Telephone Encounter (Signed)
Lmom, 2 days ago. Will call pt back.

## 2020-05-05 ENCOUNTER — Institutional Professional Consult (permissible substitution): Payer: 59 | Admitting: Pulmonary Disease

## 2020-05-10 ENCOUNTER — Ambulatory Visit (INDEPENDENT_AMBULATORY_CARE_PROVIDER_SITE_OTHER): Payer: 59 | Admitting: Orthopedic Surgery

## 2020-05-10 ENCOUNTER — Other Ambulatory Visit: Payer: Self-pay

## 2020-05-10 DIAGNOSIS — M25562 Pain in left knee: Secondary | ICD-10-CM

## 2020-05-10 DIAGNOSIS — M25561 Pain in right knee: Secondary | ICD-10-CM | POA: Diagnosis not present

## 2020-05-10 DIAGNOSIS — G8929 Other chronic pain: Secondary | ICD-10-CM | POA: Diagnosis not present

## 2020-05-10 DIAGNOSIS — M1710 Unilateral primary osteoarthritis, unspecified knee: Secondary | ICD-10-CM

## 2020-05-10 NOTE — Progress Notes (Signed)
Chief Complaint  Patient presents with  . Knee Pain    Bilateral/ hurting mostly in left today, but over the weekend both were hurting pretty bad.   Procedure note for bilateral knee injections  Procedure note left knee injection verbal consent was obtained to inject left knee joint  Timeout was completed to confirm the site of injection  The medications used were 6 mg Celestone with Sensorcaine Anesthesia was provided by ethyl chloride and the skin was prepped with alcohol.  After cleaning the skin with alcohol a 20-gauge needle was used to inject the left knee joint. There were no complications. A sterile bandage was applied.   Procedure note right knee injection verbal consent was obtained to inject right knee joint  Timeout was completed to confirm the site of injection  The medications used were 6 mg Celestone with Sensorcaine Anesthesia was provided by ethyl chloride and the skin was prepped with alcohol.  After cleaning the skin with alcohol a 20-gauge needle was used to inject the right knee joint. There were no complications. A sterile bandage was applied.  Encounter Diagnoses  Name Primary?  . Chronic pain of right knee Yes  . Chronic pain of left knee   . Primary localized osteoarthritis of knee both knees

## 2020-05-11 ENCOUNTER — Telehealth: Payer: Self-pay | Admitting: Orthopedic Surgery

## 2020-05-11 NOTE — Telephone Encounter (Signed)
Patient called and had a question about a bill from the office. Please advise

## 2020-05-27 NOTE — Telephone Encounter (Signed)
Called back to patient; discussed as documented in Account note - patient also had already contacted billing office.

## 2020-06-01 ENCOUNTER — Other Ambulatory Visit (HOSPITAL_COMMUNITY)
Admission: RE | Admit: 2020-06-01 | Discharge: 2020-06-01 | Disposition: A | Discharge status: Home / Self Care | Payer: 59 | Source: Ambulatory Visit | Attending: Pulmonary Disease | Admitting: Pulmonary Disease

## 2020-06-01 ENCOUNTER — Ambulatory Visit (INDEPENDENT_AMBULATORY_CARE_PROVIDER_SITE_OTHER): Payer: 59 | Admitting: Pulmonary Disease

## 2020-06-01 ENCOUNTER — Encounter: Payer: Self-pay | Admitting: Pulmonary Disease

## 2020-06-01 ENCOUNTER — Ambulatory Visit (HOSPITAL_COMMUNITY)
Admission: RE | Admit: 2020-06-01 | Discharge: 2020-06-01 | Disposition: A | Discharge status: Home / Self Care | Payer: 59 | Source: Ambulatory Visit | Attending: Pulmonary Disease | Admitting: Pulmonary Disease

## 2020-06-01 ENCOUNTER — Other Ambulatory Visit: Payer: Self-pay

## 2020-06-01 VITALS — BP 132/72 | HR 85 | Temp 97.1°F | Ht 66.0 in | Wt 252.4 lb

## 2020-06-01 DIAGNOSIS — J4551 Severe persistent asthma with (acute) exacerbation: Secondary | ICD-10-CM

## 2020-06-01 DIAGNOSIS — J441 Chronic obstructive pulmonary disease with (acute) exacerbation: Secondary | ICD-10-CM | POA: Insufficient documentation

## 2020-06-01 LAB — CBC WITH DIFFERENTIAL/PLATELET
Abs Immature Granulocytes: 0.02 10*3/uL (ref 0.00–0.07)
Basophils Absolute: 0.1 10*3/uL (ref 0.0–0.1)
Basophils Relative: 1 %
Eosinophils Absolute: 0.1 10*3/uL (ref 0.0–0.5)
Eosinophils Relative: 2 %
HCT: 40.1 % (ref 36.0–46.0)
Hemoglobin: 12.2 g/dL (ref 12.0–15.0)
Immature Granulocytes: 0 %
Lymphocytes Relative: 38 %
Lymphs Abs: 2.1 10*3/uL (ref 0.7–4.0)
MCH: 23.4 pg — ABNORMAL LOW (ref 26.0–34.0)
MCHC: 30.4 g/dL (ref 30.0–36.0)
MCV: 77 fL — ABNORMAL LOW (ref 80.0–100.0)
Monocytes Absolute: 0.5 10*3/uL (ref 0.1–1.0)
Monocytes Relative: 9 %
Neutro Abs: 2.9 10*3/uL (ref 1.7–7.7)
Neutrophils Relative %: 50 %
Platelets: 261 10*3/uL (ref 150–400)
RBC: 5.21 MIL/uL — ABNORMAL HIGH (ref 3.87–5.11)
RDW: 16.2 % — ABNORMAL HIGH (ref 11.5–15.5)
WBC: 5.7 10*3/uL (ref 4.0–10.5)
nRBC: 0 % (ref 0.0–0.2)

## 2020-06-01 LAB — COMPREHENSIVE METABOLIC PANEL
ALT: 14 U/L (ref 0–44)
AST: 15 U/L (ref 15–41)
Albumin: 3.9 g/dL (ref 3.5–5.0)
Alkaline Phosphatase: 69 U/L (ref 38–126)
Anion gap: 8 (ref 5–15)
BUN: 19 mg/dL (ref 6–20)
CO2: 25 mmol/L (ref 22–32)
Calcium: 9.2 mg/dL (ref 8.9–10.3)
Chloride: 107 mmol/L (ref 98–111)
Creatinine, Ser: 0.99 mg/dL (ref 0.44–1.00)
GFR, Estimated: >60 mL/min (ref >60)
Glucose, Bld: 109 mg/dL — ABNORMAL HIGH (ref 70–99)
Potassium: 4.1 mmol/L (ref 3.5–5.1)
Sodium: 140 mmol/L (ref 135–145)
Total Bilirubin: 0.7 mg/dL (ref 0.3–1.2)
Total Protein: 7.9 g/dL (ref 6.5–8.1)

## 2020-06-01 MED ORDER — BREO ELLIPTA 100-25 MCG/INH IN AEPB: 1.0000 | INHALATION_SPRAY | Freq: Every day | RESPIRATORY_TRACT | 5 refills | Status: DC

## 2020-06-01 MED ORDER — PREDNISONE 10 MG PO TABS: ORAL_TABLET | ORAL | 0 refills | Status: AC

## 2020-06-01 MED ORDER — ALBUTEROL SULFATE HFA 108 (90 BASE) MCG/ACT IN AERS: 2.0000 | INHALATION_SPRAY | Freq: Four times a day (QID) | RESPIRATORY_TRACT | 5 refills | Status: DC | PRN

## 2020-06-01 NOTE — Progress Notes (Signed)
Thank you  Dr. Sood.  

## 2020-06-01 NOTE — Patient Instructions (Signed)
Chest xray and lab tests today  Will schedule pulmonary function test in [place] office  Prednisone 10 mg pill >> 3 pills daily for 2 days, 2 pills daily for 2 days, 1 pill daily for 2 days  Breo one puff daily, and rinse your mouth after each use  Albuterol two puffs every 6 hours as needed for cough, wheeze, sputum, or chest congestion; can use albuterol with a spacer device  Follow up in 4 weeks with Dr. Halford Chessman or Nurse Practitioner

## 2020-06-01 NOTE — Progress Notes (Addendum)
[facility]  Chief Complaint  Patient presents with  . Consult    Sleep consult    Constitutional:  BP 132/72 (BP Location: Left Arm, Cuff Size: Normal)   Pulse 85   Temp (!) 97.1 F (36.2 C) (Other (Comment)) Comment (Src): wrist  Ht 5\' 6"  (1.676 m)   Wt 252 lb 6.4 oz (114.5 kg)   SpO2 98% Comment: Room air  BMI 40.74 kg/m   Past Medical History:  HTN, CKD, DM 2, GERD, Ovarian cancer, PSTD, Seizurses, Substance abuse, Anxiety, Allergies, OA  Past Surgical History:  She  has a past surgical history that includes Partial hysterectomy; Cholecystectomy; Tonsillectomy; Hernia repair; Colonoscopy (03/01/2011); Esophagogastroduodenoscopy (2008); Esophagogastroduodenoscopy (2012); Esophagogastroduodenoscopy (N/A, 11/17/2013); Skin cancer excision; Abdominal hysterectomy; Colonoscopy with propofol (N/A, 08/08/2018); Esophagogastroduodenoscopy (egd) with propofol (N/A, 08/08/2018); maloney dilation (08/08/2018); and Knee arthroscopy.  Brief Summary:  Jennifer [name] is a 50 y.o. female former smoker with possible sleep apnea and dyspnea with reported history of asthma and COPD.      Subjective:   Previously seen by Dr. Luan Pulling.  Saw cardiology recently for syncope.  Noted to have trouble with sleep with snoring and breathing with dyspnea on exertion.  Her husband says she gets shallow breathing.  She will wake up feeling choked.  She is restless at night and feels sleepy during the day.  She goes to sleep at different time.  She falls asleep 30 minutes.  She wakes up several times to use the bathroom.  She gets out of bed at 10 am.  She feels tired in the morning.  She gets sinus headaches headache.  She does not use anything to help her stay awake.  Previously used trazodone.  Unclear why this was stopped.  She denies sleep walking, sleep talking, bruxism, or nightmares.  There is no history of restless legs.  She denies sleep hallucinations, sleep  paralysis, or cataplexy.  The Epworth score is 6 out of 24.  She quit smoking in November 2021.  Uses to smoke 1 to 2 packs per day.  She was previously told she has COPD.  Had a nebulizer machine.  She has allergies to trees, grasses, and pollen.  Spring and Fall are worse times of year for her.  She gets sinus congestion and pressure.  For past several weeks her breathing has been very tight.  She is having cough and sometimes brings up sputum.  Has episodes of wheezing.  Gets short of breath with minimal activity.    Physical Exam:   Appearance - well kempt   ENMT - no sinus tenderness, no oral exudate, no LAN, Mallampati 3 airway, no stridor  Respiratory - equal breath sounds bilaterally, diffuse bilateral wheezing  CV - s1s2 regular rate and rhythm, no murmurs  Ext - no clubbing, no edema  Skin - no rashes  Psych - normal mood and affect   Pulmonary testing:    Chest Imaging:    Sleep Tests:   PSG 04/29/13 >> AHI 0.4, SpO2 low 89%  Cardiac Tests:    Social History:  She  reports that she quit smoking about 5 months ago. Her smoking use included cigarettes. She started smoking about 36 years ago. She has a 39.00 pack-year smoking history. She has never used smokeless tobacco. She reports current alcohol use. She reports current drug use. Drug: Marijuana.  Family History:  Her family history includes Alcohol abuse in her father, maternal uncle, mother, paternal grandmother,  and paternal uncle; Arthritis in her mother; Breast cancer in her maternal grandmother; Breast cancer (age of onset: 69) in her daughter; COPD in her mother; Cancer in her maternal grandfather, maternal grandmother, paternal grandfather, and paternal grandmother; Colon cancer in her maternal grandfather; Colon polyps (age of onset: 36) in her mother; Crohn's disease in her cousin and maternal aunt; Depression in her mother; Drug abuse in her father, mother, and paternal uncle; Early death (age of onset:  55) in her father; Heart disease in her mother; Hyperlipidemia in her mother; Hypertension in her mother; Learning disabilities in her father and mother; Mental illness in her mother; Pancreatic cancer in her maternal grandfather.     Assessment/Plan:   Dyspnea on exertion with history of tobacco abuse. - reported history of COPD, asthma, and allergies - she has acute asthmatic bronchitis - will give her course of prednisone - will start breo with prn albuterol; can use albuterol with spacer device - will arrange for pulmonary function test and chest xray  - will check CBC with diff, RAST with IgE  Snoring with excessive daytime sleepiness. - will need to get better control of her obstructive lung disease and then reassess whether she needs to have sleep testing to assess for obstructive sleep apnea  Syncope. - she has follow up with Dr. Carlyle Dolly with [facility]  Obesity. - discussed how weight can impact sleep and risk for sleep disordered breathing - discussed options to assist with weight loss: combination of diet modification, cardiovascular and strength training exercises   Time Spent Involved in Patient Care on Day of Examination:  48 minutes  Follow up:  Patient Instructions  Chest xray and lab tests today  Will schedule pulmonary function test in [place] office  Prednisone 10 mg pill >> 3 pills daily for 2 days, 2 pills daily for 2 days, 1 pill daily for 2 days  Breo one puff daily, and rinse your mouth after each use  Albuterol two puffs every 6 hours as needed for cough, wheeze, sputum, or chest congestion; can use albuterol with a spacer device  Follow up in 4 weeks with Dr. Halford Chessman or Nurse Practitioner   Medication List:   Allergies as of 06/01/2020      Reactions   Metrizamide Rash, Swelling   Hands swelling/rash   Shellfish Allergy Swelling   Swells tongue   Amoxapine Nausea Only   Amoxapine And Related    Nausea   Codeine Other (See  Comments)   itching   Hydrocodone Itching   Rash and itching   Ivp Dye [iodinated Diagnostic Agents] Rash   Hands swelling/rash   Latex Rash      Medication List       Accurate as of June 01, 2020 10:39 AM. If you have any questions, ask your nurse or doctor.        cetirizine 10 MG tablet Commonly known as: ZYRTEC Take 1 tablet (10 mg total) by mouth every morning.   EPINEPHrine 0.15 MG/0.15ML injection Commonly known as: ADRENACLICK Inject 0.93 mg into the muscle as needed for anaphylaxis.   fluorometholone 0.1 % ophthalmic suspension Commonly known as: FML Place 1 drop into the left eye in the morning, at noon, and at bedtime.   fluticasone 50 MCG/ACT nasal spray Commonly known as: FLONASE Place 1 spray into both nostrils daily. What changed:   when to take this  reasons to take this   gabapentin 300 MG capsule Commonly known as: NEURONTIN Take  300 mg by mouth 2 (two) times daily.   lamoTRIgine 200 MG tablet Commonly known as: LAMICTAL Take 200 mg by mouth daily.   Levetiracetam 750 MG Tb24 Take 3 tablets (2,250 mg total) by mouth at bedtime.   linaclotide 145 MCG Caps capsule Commonly known as: Linzess Take 1 capsule (145 mcg total) by mouth daily before breakfast.   methocarbamol 750 MG tablet Commonly known as: ROBAXIN Take 750 mg by mouth 2 (two) times daily as needed.   ondansetron 4 MG tablet Commonly known as: ZOFRAN Take 4 mg by mouth every 8 (eight) hours as needed for nausea or vomiting.   pantoprazole 40 MG tablet Commonly known as: PROTONIX TAKE 1 TABLET BY MOUTH TWICE DAILY BEFORE MEALS.   propranolol 10 MG tablet Commonly known as: INDERAL Take 0.5 tablets (5 mg total) by mouth 2 (two) times daily.   Refresh Relieva 0.5-0.9 % ophthalmic solution Generic drug: carboxymethylcellul-glycerin Place 1 drop into the left eye daily.   Vitamin D (Ergocalciferol) 1.25 MG (50000 UNIT) Caps capsule Commonly known as: DRISDOL Take 1  capsule (50,000 Units total) by mouth every 7 (seven) days.       Signature:  Chesley Mires, MD [place] Pager - [phone] 06/01/2020, 10:39 AM

## 2020-06-02 ENCOUNTER — Ambulatory Visit: Payer: 59 | Admitting: Gastroenterology

## 2020-06-02 ENCOUNTER — Other Ambulatory Visit: Payer: Self-pay | Admitting: Pulmonary Disease

## 2020-06-02 ENCOUNTER — Other Ambulatory Visit: Payer: Self-pay

## 2020-06-02 NOTE — Progress Notes (Signed)
Received faxed results from Dr Trena Platt office. Paperwork given to Dr Halford Chessman. Routing to Dr Halford Chessman and will await Dr Juanetta Gosling response as to follow up.

## 2020-06-02 NOTE — Progress Notes (Signed)
Called and went over lab result per Dr Halford Chessman. Patient stated she had allergy testing done recently and is having Dr Manuella Ghazi (PCP) fax information/results over to [facility] office. Dr Halford Chessman aware and ok with this. Will await to see if fax comes in prior to ordering blood work per verbal from Dr Halford Chessman.

## 2020-06-02 NOTE — Progress Notes (Signed)
Called and went over Dr Juanetta Gosling result message and recommendations. All questions answered and patient expressed full understanding. Nothing further needed at this time.

## 2020-06-03 ENCOUNTER — Encounter: Payer: Self-pay | Admitting: *Deleted

## 2020-06-03 NOTE — Progress Notes (Signed)
Letter mailed to the pt. 

## 2020-06-10 ENCOUNTER — Telehealth: Payer: Self-pay

## 2020-06-10 NOTE — Telephone Encounter (Signed)
06/09/2020 Linzess samples were put back in stock. (they were left for pt on 04/23/2020 she never picked up.

## 2020-06-17 DIAGNOSIS — G4700 Insomnia, unspecified: Secondary | ICD-10-CM | POA: Insufficient documentation

## 2020-06-25 ENCOUNTER — Other Ambulatory Visit (HOSPITAL_COMMUNITY)
Admission: RE | Admit: 2020-06-25 | Discharge: 2020-06-25 | Disposition: A | Discharge status: Home / Self Care | Payer: 59 | Source: Ambulatory Visit | Attending: Primary Care | Admitting: Primary Care

## 2020-06-25 DIAGNOSIS — Z01812 Encounter for preprocedural laboratory examination: Secondary | ICD-10-CM | POA: Insufficient documentation

## 2020-06-25 DIAGNOSIS — Z20822 Contact with and (suspected) exposure to covid-19: Secondary | ICD-10-CM | POA: Diagnosis not present

## 2020-06-25 LAB — SARS CORONAVIRUS 2 (TAT 6-24 HRS): SARS Coronavirus 2: NEGATIVE

## 2020-06-29 ENCOUNTER — Telehealth: Payer: Self-pay | Admitting: *Deleted

## 2020-06-29 ENCOUNTER — Other Ambulatory Visit: Payer: Self-pay

## 2020-06-29 ENCOUNTER — Ambulatory Visit (INDEPENDENT_AMBULATORY_CARE_PROVIDER_SITE_OTHER): Payer: 59 | Admitting: Pulmonary Disease

## 2020-06-29 ENCOUNTER — Encounter: Payer: Self-pay | Admitting: Primary Care

## 2020-06-29 ENCOUNTER — Ambulatory Visit (INDEPENDENT_AMBULATORY_CARE_PROVIDER_SITE_OTHER): Payer: 59 | Admitting: Primary Care

## 2020-06-29 ENCOUNTER — Telehealth: Payer: Self-pay | Admitting: Primary Care

## 2020-06-29 VITALS — BP 142/80 | HR 77 | Temp 97.5°F | Ht 65.0 in | Wt 258.0 lb

## 2020-06-29 DIAGNOSIS — J45998 Other asthma: Secondary | ICD-10-CM | POA: Insufficient documentation

## 2020-06-29 DIAGNOSIS — J4540 Moderate persistent asthma, uncomplicated: Secondary | ICD-10-CM

## 2020-06-29 DIAGNOSIS — J441 Chronic obstructive pulmonary disease with (acute) exacerbation: Secondary | ICD-10-CM

## 2020-06-29 DIAGNOSIS — J4551 Severe persistent asthma with (acute) exacerbation: Secondary | ICD-10-CM

## 2020-06-29 DIAGNOSIS — J4520 Mild intermittent asthma, uncomplicated: Secondary | ICD-10-CM

## 2020-06-29 DIAGNOSIS — R0683 Snoring: Secondary | ICD-10-CM | POA: Insufficient documentation

## 2020-06-29 DIAGNOSIS — J45909 Unspecified asthma, uncomplicated: Secondary | ICD-10-CM | POA: Insufficient documentation

## 2020-06-29 LAB — PULMONARY FUNCTION TEST
DL/VA % pred: 111 %
DL/VA: 4.76 ml/min/mmHg/L
DLCO cor % pred: 95 %
DLCO cor: 21.59 ml/min/mmHg
DLCO unc % pred: 95 %
DLCO unc: 21.59 ml/min/mmHg
FEF 25-75 Post: 3.54 L/sec
FEF 25-75 Pre: 2.31 L/sec
FEF2575-%Change-Post: 53 %
FEF2575-%Pred-Post: 134 %
FEF2575-%Pred-Pre: 87 %
FEV1-%Change-Post: 12 %
FEV1-%Pred-Post: 99 %
FEV1-%Pred-Pre: 88 %
FEV1-Post: 2.52 L
FEV1-Pre: 2.24 L
FEV1FVC-%Change-Post: 1 %
FEV1FVC-%Pred-Pre: 102 %
FEV6-%Change-Post: 10 %
FEV6-%Pred-Post: 97 %
FEV6-%Pred-Pre: 87 %
FEV6-Post: 2.98 L
FEV6-Pre: 2.69 L
FEV6FVC-%Pred-Post: 102 %
FEV6FVC-%Pred-Pre: 102 %
FVC-%Change-Post: 10 %
FVC-%Pred-Post: 94 %
FVC-%Pred-Pre: 85 %
FVC-Post: 2.98 L
FVC-Pre: 2.69 L
Post FEV1/FVC ratio: 85 %
Post FEV6/FVC ratio: 100 %
Pre FEV1/FVC ratio: 83 %
Pre FEV6/FVC Ratio: 100 %
RV % pred: 74 %
RV: 1.39 L
TLC % pred: 90 %
TLC: 4.87 L

## 2020-06-29 LAB — NITRIC OXIDE: Nitric Oxide: 15

## 2020-06-29 MED ORDER — BUDESONIDE 0.25 MG/2ML IN SUSP: 0.2500 mg | Freq: Two times a day (BID) | RESPIRATORY_TRACT | 11 refills | Status: DC

## 2020-06-29 MED ORDER — FORMOTEROL FUMARATE 20 MCG/2ML IN NEBU
20.0000 ug | INHALATION_SOLUTION | Freq: Two times a day (BID) | RESPIRATORY_TRACT | 11 refills | Status: DC
Start: 2020-06-29 — End: 2020-06-30

## 2020-06-29 NOTE — Patient Instructions (Signed)
Full PFT performed today. °

## 2020-06-29 NOTE — Telephone Encounter (Signed)
Patient has sevearl parts to her issue  1.  She saw [facility] today and they did a test  Today that measured how her she breathed--they are going to add to her nebulizer a medication to help with her asthma and give her an inhaler  She is going to have a at home sleep study too.  She was to come back once all this done and will need to know when  2.  Linzess isnot covered by her Insurance now.--I reasked and she again said it was her Linzess--will need something else called in that it will cover--

## 2020-06-29 NOTE — Progress Notes (Signed)
Full PFT performed today. °

## 2020-06-29 NOTE — Assessment & Plan Note (Signed)
-   Patient reports symptoms of snoring, witness apnea and daytime sleepiness. Epworth 17. Order HST to evaluate for underlying sleep apnea

## 2020-06-29 NOTE — Telephone Encounter (Signed)
Can you please send in Pulmicort 0.25mg /39ml BID and Perforomist 61mcg BID [facility]

## 2020-06-29 NOTE — Patient Instructions (Addendum)
Pulmonary function testing showed mild restriction with positive BD resonse consistent with diagnosis of asthma. Plan will be to stop Breo and start nebulizers twice a day once you have received them.   Orders: - FENO re: asthma   Asthma: - Continue Breo 11mcg one puff in the morning (rinse mouth after use)  - Once you receive BOTH Pulmicort (budesonide) and Perforomist (formoterol) nebulizers call our office to notify us.   Headache: - Please discuss with neurology about getting home sleep study changed to in-lab; also if you are still having neck/head pain please schedule visit to see them sooner   Follow-up: - Televisit in 1 month with [facility] NP to follow-up on medication changes

## 2020-06-29 NOTE — Assessment & Plan Note (Addendum)
-   Patient has intermittent symptoms of dry cough, dyspnea with activities, chest tightness and wheezing - Pulmonary function testing today showed mild restriction with positive BD resonse consistent with diagnosis of asthma. She has difficulty using Ellipta and HFA inhalers. - Plan will change Breo 100 to nebulized ICS/LABA (Budesonide 0.25/45ml and Formoterol 23mcg/2ml twice daily) - FU in 4 week televisit to assess medication response

## 2020-06-29 NOTE — Progress Notes (Signed)
@Patient  ID: Jennifer [name], female    DOB: 12-Jul-1970, 50 y.o.   MRN: [id number]  Chief Complaint  Patient presents with  . Follow-up    PFT follow up/ SOB increased/ light-headedness increase    Referring provider: Monico Blitz, MD  HPI: 50 year old female, former smoker quit in Nov 2021. PMH significant for COPD. Patient of Dr. Halford Chessman, seen for initial consult on 06/01/20.   Previous [facility]: 06/01/20- Consult, Dr. Halford Chessman  Previously seen by Dr. Luan Pulling.  Saw cardiology recently for syncope.  Noted to have trouble with sleep with snoring and breathing with dyspnea on exertion.  Her husband says she gets shallow breathing.  She will wake up feeling choked.  She is restless at night and feels sleepy during the day.  She goes to sleep at different time.  She falls asleep 30 minutes.  She wakes up several times to use the bathroom.  She gets out of bed at 10 am.  She feels tired in the morning.  She gets sinus headaches headache.  She does not use anything to help her stay awake.  Previously used trazodone.  Unclear why this was stopped.  She denies sleep walking, sleep talking, bruxism, or nightmares.  There is no history of restless legs.  She denies sleep hallucinations, sleep paralysis, or cataplexy.  The Epworth score is 6 out of 24.  She quit smoking in November 2021.  Uses to smoke 1 to 2 packs per day.  She was previously told she has COPD.  Had a nebulizer machine.  She has allergies to trees, grasses, and pollen.  Spring and Fall are worse times of year for her.  She gets sinus congestion and pressure.  For past several weeks her breathing has been very tight.  She is having cough and sometimes brings up sputum.  Has episodes of wheezing.  Gets short of breath with minimal activity.  06/29/2020- Interim hx Patient presents today for follow-up with PFTs. Increased shortness of breath and light headedness from breathing test. Reported history of COPD, asthma and alelrgies.  Treated for acute asthmatic bronchitis during last visit with steriods and started on Breo/prn albuterol. She has symptoms of dry cough, dyspnea with activities, chest tightness and wheezing. She has difficulty using both her BREO ellipta and albuterol hfa inhalers. PFTs today showed mild restrictoin with positive BD response. Normal diffusion capacity.  She recently started smoking again 2-3 weeks ago.  She is following with [facility] for syncope and neurology for headache/neck pain.   Pulmonary testing 06/29/2020 PFTs- FVC 2.98 (94%), FEV1 2.52 (99%), ratio 85, TLC 90, DLCOunc 21.59 (95%) Mild restriction with positive BD response. Normal diffusion capacity.   06/29/2020 FENO - 15   Allergies  Allergen Reactions  . Metrizamide Rash and Swelling    Hands swelling/rash  . Shellfish Allergy Swelling    Swells tongue  . Amoxapine Nausea Only  . Amoxapine And Related     Nausea   . Codeine Other (See Comments)    itching  . Hydrocodone Itching    Rash and itching  . Ivp Dye [Iodinated Diagnostic Agents] Rash    Hands swelling/rash  . Latex Rash    Immunization History  Administered Date(s) Administered  . Influenza Split 12/06/2009  . Influenza,inj,Quad PF,6+ Mos 10/12/2016, 10/22/2017, 10/25/2018  . Influenza-Unspecified 03/08/2017  . Moderna SARS-COV2 Booster Vaccination 06/18/2020  . Moderna Sars-Covid-2 Vaccination 04/24/2019, 04/24/2019, 05/21/2019  . Pneumococcal Polysaccharide-23 10/22/2013    Past Medical History:  Diagnosis Date  . Allergy   . Anemia   . Anxiety   . Arthritis    neck  . Asthma   . Chronic kidney disease, stage 2 (mild) 01/21/2020  . Diabetes mellitus without complication ([place])   . GERD (gastroesophageal reflux disease)   . History of flexible sigmoidoscopy 1999   Normal to 60cm,  . Hypertension   . Neuromuscular disorder ([place])   . Ovarian cancer ([place])    skin cancer  . Panic attack   . PTSD (post-traumatic stress disorder)   . S/P  endoscopy Feb 2012   Nl, s/p [facility] dilator, biopsy benign  . S/P endoscopy 2008   Mild gastritis  . Seizures ([place])   . Substance abuse ([place])    drug addiction    Tobacco History: Social History   Tobacco Use  Smoking Status Former Smoker  . Packs/day: 1.50  . Years: 26.00  . Pack years: 39.00  . Types: Cigarettes  . Start date: 05/03/1984  . Quit date: 12/27/2019  . Years since quitting: 0.5  Smokeless Tobacco Never Used   Counseling given: Not Answered   Outpatient Medications Prior to Visit  Medication Sig Dispense Refill  . albuterol (VENTOLIN HFA) 108 (90 Base) MCG/ACT inhaler Inhale 2 puffs into the lungs every 6 (six) hours as needed for wheezing or shortness of breath. 8 g 5  . carboxymethylcellul-glycerin (REFRESH RELIEVA) 0.5-0.9 % ophthalmic solution Place 1 drop into the left eye daily.    . cetirizine (ZYRTEC) 10 MG tablet Take 1 tablet (10 mg total) by mouth every morning. 90 tablet 3  . EPINEPHrine 0.15 MG/0.15ML IJ injection Inject 0.15 mg into the muscle as needed for anaphylaxis.    . fluorometholone (FML) 0.1 % ophthalmic suspension Place 1 drop into the left eye in the morning, at noon, and at bedtime.    . fluticasone (FLONASE) 50 MCG/ACT nasal spray Place 1 spray into both nostrils daily. (Patient taking differently: Place 1 spray into both nostrils 3 (three) times daily as needed for allergies.) 16 g 11  . fluticasone furoate-vilanterol (BREO ELLIPTA) 100-25 MCG/INH AEPB Inhale 1 puff into the lungs daily. 30 each 5  . gabapentin (NEURONTIN) 300 MG capsule Take 300 mg by mouth 2 (two) times daily.    Marland Kitchen lamoTRIgine (LAMICTAL) 200 MG tablet Take 200 mg by mouth daily.    . Levetiracetam 750 MG TB24 Take 3 tablets (2,250 mg total) by mouth at bedtime. 270 tablet 4  . linaclotide (LINZESS) 145 MCG CAPS capsule Take 1 capsule (145 mcg total) by mouth daily before breakfast. 90 capsule 3  . methocarbamol (ROBAXIN) 750 MG tablet Take 750 mg by mouth 2  (two) times daily as needed.    . ondansetron (ZOFRAN) 4 MG tablet Take 4 mg by mouth every 8 (eight) hours as needed for nausea or vomiting.    . pantoprazole (PROTONIX) 40 MG tablet TAKE 1 TABLET BY MOUTH TWICE DAILY BEFORE MEALS. 56 tablet 11  . propranolol (INDERAL) 10 MG tablet Take 0.5 tablets (5 mg total) by mouth 2 (two) times daily. 90 tablet 1  . Vitamin D, Ergocalciferol, (DRISDOL) 1.25 MG (50000 UT) CAPS capsule Take 1 capsule (50,000 Units total) by mouth every 7 (seven) days. 5 capsule 11   No facility-administered medications prior to visit.    Review of Systems  Review of Systems  Constitutional: Negative.   HENT: Negative.   Respiratory: Positive for cough, chest tightness, shortness of breath and wheezing.  Musculoskeletal: Positive for neck pain.  Neurological: Positive for headaches.     Physical Exam  BP (!) 142/80 (BP Location: Right Arm, Cuff Size: Large)   Pulse 77   Temp (!) 97.5 F (36.4 C) (Temporal)   Ht 5\' 5"  (1.651 m)   Wt 258 lb (117 kg)   SpO2 100%   BMI 42.93 kg/m  Physical Exam Constitutional:      Appearance: Normal appearance.  HENT:     Mouth/Throat:     Mouth: Mucous membranes are moist.     Pharynx: Oropharynx is clear.  Cardiovascular:     Rate and Rhythm: Normal rate and regular rhythm.  Pulmonary:     Effort: Pulmonary effort is normal.     Breath sounds: Normal breath sounds. No wheezing, rhonchi or rales.  Musculoskeletal:        General: Normal range of motion.  Skin:    General: Skin is warm and dry.  Neurological:     General: No focal deficit present.     Mental Status: She is alert and oriented to person, place, and time. Mental status is at baseline.  Psychiatric:        Mood and Affect: Mood normal.        Behavior: Behavior normal.        Thought Content: Thought content normal.        Judgment: Judgment normal.      Lab Results:  CBC    Component Value Date/Time   WBC 5.7 06/01/2020 1224   RBC 5.21  (H) 06/01/2020 1224   HGB 12.2 06/01/2020 1224   HGB 12.5 04/16/2018 1153   HCT 40.1 06/01/2020 1224   HCT 39.2 04/16/2018 1153   PLT 261 06/01/2020 1224   PLT 308 04/16/2018 1153   MCV 77.0 (L) 06/01/2020 1224   MCV 74 (L) 04/16/2018 1153   MCH 23.4 (L) 06/01/2020 1224   MCHC 30.4 06/01/2020 1224   RDW 16.2 (H) 06/01/2020 1224   RDW 16.0 (H) 04/16/2018 1153   LYMPHSABS 2.1 06/01/2020 1224   LYMPHSABS 2.8 04/16/2018 1153   MONOABS 0.5 06/01/2020 1224   EOSABS 0.1 06/01/2020 1224   EOSABS 0.1 04/16/2018 1153   BASOSABS 0.1 06/01/2020 1224   BASOSABS 0.1 04/16/2018 1153    BMET    Component Value Date/Time   NA 140 06/01/2020 1224   NA 140 02/07/2018 1011   K 4.1 06/01/2020 1224   K 4.1 02/23/2012 0000   CL 107 06/01/2020 1224   CO2 25 06/01/2020 1224   GLUCOSE 109 (H) 06/01/2020 1224   BUN 19 06/01/2020 1224   BUN 13 02/07/2018 1011   CREATININE 0.99 06/01/2020 1224   CREATININE 1.09 10/12/2016 1517   CALCIUM 9.2 06/01/2020 1224   CALCIUM 8.9 02/23/2012 0000   GFRNONAA >60 06/01/2020 1224   GFRNONAA 61 10/12/2016 1517   GFRAA 57 (L) 02/07/2018 1011   GFRAA 71 10/12/2016 1517    BNP No results found for: BNP  ProBNP No results found for: PROBNP  Imaging: DG Chest 2 View  Result Date: 06/02/2020 CLINICAL DATA:  50 year old female with history of dyspnea. Former smoker. EXAM: CHEST - 2 VIEW COMPARISON:  Chest x-ray 03/14/2019. FINDINGS: Lung volumes are normal. No consolidative airspace disease. No pleural effusions. No pneumothorax. No pulmonary nodule or mass noted. Pulmonary vasculature and the cardiomediastinal silhouette are within normal limits. IMPRESSION: No radiographic evidence of acute cardiopulmonary disease. Electronically Signed   By: Vinnie Langton M.D.   On:  06/02/2020 10:27     Assessment & Plan:   Asthma - Patient has intermittent symptoms of dry cough, dyspnea with activities, chest tightness and wheezing - Pulmonary function testing today  showed mild restriction with positive BD resonse consistent with diagnosis of asthma. She has difficulty using Ellipta and HFA inhalers. - Plan will change Breo 100 to nebulized ICS/LABA (Budesonide 0.25/33ml and Formoterol 58mcg/2ml twice daily) - FU in 4 week televisit to assess medication response   Loud snoring - Patient reports symptoms of snoring, witness apnea and daytime sleepiness. Epworth 17. Order HST to evaluate for underlying sleep apnea    Martyn Ehrich, NP 06/29/2020

## 2020-06-29 NOTE — Telephone Encounter (Signed)
Rx for Pulmicort and perforomist has been sent to [facility].  Jennifer [name] with [facility] has been made aware. Nothing further needed at this time.

## 2020-06-30 ENCOUNTER — Telehealth: Payer: Self-pay | Admitting: Primary Care

## 2020-06-30 MED ORDER — BUDESONIDE 0.25 MG/2ML IN SUSP: 0.2500 mg | Freq: Two times a day (BID) | RESPIRATORY_TRACT | 11 refills | Status: DC

## 2020-06-30 MED ORDER — FORMOTEROL FUMARATE 20 MCG/2ML IN NEBU: 20.0000 ug | INHALATION_SOLUTION | Freq: Two times a day (BID) | RESPIRATORY_TRACT | 11 refills | Status: DC

## 2020-06-30 NOTE — Telephone Encounter (Signed)
Pts preferred pharmacy is [facility], [place], [place] [address], [phone]. States that medication from last telephone call was sent to wrong pharmacy. This needs to be switched in our system as well as have her medication sent to this pharmacy instead. Please advise.

## 2020-06-30 NOTE — Telephone Encounter (Signed)
Called and spoke with Patient.  Patient stated she received a call from a pharmacy in [place] asking for information.  Patient told them there was a mistake and her meds come from [facility].  Patient stated she needs new prescriptions for Perforomist and Pulmicort to [facility].  Explained to Patient that meds were sent to [facility] and most times nebs are much cheaper for Patient.  Explained [facility] is mail order.  Patient stated she has accounts at [facility] and all prescriptions are taken care of on that account.  Patient stated [facility] price was $30.  Advised [facility] may be much higher, but Patient insisted on continuing all prescriptions to [facility]. New prescriptions for Perforomist and Pulmicort sent to [facility] per Patient request.  Nothing further at this time.

## 2020-07-01 ENCOUNTER — Telehealth: Payer: Self-pay | Admitting: Primary Care

## 2020-07-01 NOTE — Telephone Encounter (Addendum)
Called and spoke with patient who states Pt is calling in regards to her medication for her nebulizer. Insurance Artist) is in need of prior authorization for it, so she has not received this medication yet. Writer called [facility] who stated a PA was needed for the Pulmicort neb and performist nebs. Writer asked pharmacist to fax PA over to office, have not received this yet.  Called [facility] to get PA started was on hold for 15 minutes, hung up at this time. Will try again another time.

## 2020-07-06 NOTE — Telephone Encounter (Signed)
I called [facility] for PA info  Long hold time again   Called the pt and asked about using [facility] since she knows she can get it from there with no PA. She still would like to use [facility]. WCB [facility] again later.

## 2020-07-09 NOTE — Telephone Encounter (Signed)
PA request was received from (pharmacy): [facility] Phone: [phone]  Fax:  Medication name and strength: Performist neb Ordering Provider: Otho Najjar NP  Was PA started with [facility]?: yes If yes, please enter KEY: [id number] Medication tried and failed: Anoro Covered Alternatives: N/A  PA sent to plan, time frame for approval / denial: 24-72 hours

## 2020-07-09 NOTE — Telephone Encounter (Signed)
Called patient and updated on PA for Performist being sent in and awaiting response. Patient aware we will call and let know when we get feedback back on accepted or denial.

## 2020-07-12 ENCOUNTER — Ambulatory Visit
Admission: RE | Admit: 2020-07-12 | Discharge: 2020-07-12 | Disposition: A | Discharge status: Home / Self Care | Payer: 59 | Source: Ambulatory Visit | Attending: Internal Medicine | Admitting: Internal Medicine

## 2020-07-12 ENCOUNTER — Other Ambulatory Visit: Payer: Self-pay | Admitting: Internal Medicine

## 2020-07-12 ENCOUNTER — Other Ambulatory Visit: Payer: Self-pay

## 2020-07-12 DIAGNOSIS — Z139 Encounter for screening, unspecified: Secondary | ICD-10-CM

## 2020-07-14 NOTE — Telephone Encounter (Signed)
Can you do a benefits investigation for nebulized ICS/LABA. Perforomist was denied.

## 2020-07-14 NOTE — Telephone Encounter (Signed)
Denied on June 4 for her Perforomist  This request has not been approved. Based on the information submitted for review, you did not meet our guideline rules for the requested drug. In order for your request to be approved, your provider would need to show that you have met the guideline rules below. The details below are written in medical language. If you have questions, please contact your provider. In some cases, the requested medication or alternatives offered may have additional approval requirements. Our guideline named FORMOTEROL FUMARATE (Perforomist) requires the following rule(s) be met for approval: Formoterol will not be used for the treatment of asthmaYour doctor told us that your diagnoses are chronic obstructive pulmonary disease (COPD: a type of lung condition) and asthma (a long-term lung condition) and requested this medication for treatment. Your doctor told us that formoterol will be used for the treatment of asthma. This is why your request is denied. Please work with your doctor to use a different medication or get [place] more information if it will allow [place] to approve this request. A written notification letter will follow with additional details.  EW please advise if there is another medication that can be used.  Thanks

## 2020-07-15 ENCOUNTER — Other Ambulatory Visit (HOSPITAL_COMMUNITY): Payer: Self-pay

## 2020-07-15 NOTE — Telephone Encounter (Signed)
Insurance is not covering nebulized long acting bronchodilator. She can either do Pulmicort (budesonide) + Albuterol q 6 hours OR we can send in RX for Advair 250-30mcg one puff daily daily.

## 2020-07-15 NOTE — Telephone Encounter (Signed)
Attempted to call pt but unable to reach. Unable to leave VM due to mailbox being full. Will try to call back later. 

## 2020-07-15 NOTE — Telephone Encounter (Signed)
Advair Diskus 250-73mcg looks to be the cheapest in the category for the pt, coming in at a $5 copay when billed using DAW code 1 (provider requested brand name) OR code 9 (plan requested brand name). If it's possible to preemptively communicate that info on the prescription itself, it would be beneficial to the pharmacy and hopefully avoid the need for any additional phone calls to gain override permission. Thanks!

## 2020-07-16 ENCOUNTER — Encounter: Payer: Self-pay | Admitting: *Deleted

## 2020-07-16 NOTE — Telephone Encounter (Signed)
Tried calling the pt and there was no answer and her VM was still full. Will mail her a letter.

## 2020-07-19 ENCOUNTER — Telehealth: Payer: Self-pay | Admitting: Primary Care

## 2020-07-19 ENCOUNTER — Telehealth: Payer: Self-pay

## 2020-07-19 NOTE — Telephone Encounter (Signed)
Spoke with pt who states she was able to pick up the pulmicort from to pharmacy. Home sleep test orders and televisit in June questions were also addressed with pt.  Routing to [facility] as Juluis Rainier. Pt states no other needs at this time.

## 2020-07-21 NOTE — Telephone Encounter (Signed)
Nothing is noted in encounter. Will close message.

## 2020-07-30 ENCOUNTER — Other Ambulatory Visit: Payer: Self-pay

## 2020-07-30 ENCOUNTER — Ambulatory Visit (INDEPENDENT_AMBULATORY_CARE_PROVIDER_SITE_OTHER): Payer: 59 | Admitting: Primary Care

## 2020-07-30 ENCOUNTER — Telehealth: Payer: Self-pay | Admitting: Primary Care

## 2020-07-30 ENCOUNTER — Encounter: Payer: Self-pay | Admitting: Primary Care

## 2020-07-30 DIAGNOSIS — R0683 Snoring: Secondary | ICD-10-CM | POA: Diagnosis not present

## 2020-07-30 DIAGNOSIS — J4520 Mild intermittent asthma, uncomplicated: Secondary | ICD-10-CM | POA: Diagnosis not present

## 2020-07-30 MED ORDER — ALBUTEROL SULFATE (2.5 MG/3ML) 0.083% IN NEBU
2.5000 mg | INHALATION_SOLUTION | Freq: Four times a day (QID) | RESPIRATORY_TRACT | 5 refills | Status: DC | PRN
Start: 2020-07-30 — End: 2022-10-23

## 2020-07-30 NOTE — Telephone Encounter (Signed)
Can we see about initiating PA for perforomist?

## 2020-07-30 NOTE — Telephone Encounter (Signed)
Patient was ordered for HST back in May, she has not been set up yet with date/time. Lives in  [place]. I think [place] would be closer for her.

## 2020-07-30 NOTE — Progress Notes (Signed)
Virtual Visit via Telephone Note  I connected with Jennifer [name] on 07/30/20 at  2:30 PM EDT by telephone and verified that I am speaking with the correct person using two identifiers.  Location: Patient: Home Provider: Office   I discussed the limitations, risks, security and privacy concerns of performing an evaluation and management service by telephone and the availability of in person appointments. I also discussed with the patient that there may be a patient responsible charge related to this service. The patient expressed understanding and agreed to proceed.   History of Present Illness: 50 year old female, former smoker quit in Nov 2021. PMH significant for COPD. Patient of Dr. Halford Chessman, seen for initial consult on 06/01/20.   Previous [facility] pulmonary: 06/01/20- Consult, Dr. Halford Chessman  Previously seen by Dr. Luan Pulling.  Saw cardiology recently for syncope.  Noted to have trouble with sleep with snoring and breathing with dyspnea on exertion.  Her husband says she gets shallow breathing.  She will wake up feeling choked.  She is restless at night and feels sleepy during the day.   She goes to sleep at different time.  She falls asleep 30 minutes.  She wakes up several times to use the bathroom.  She gets out of bed at 10 am.  She feels tired in the morning.  She gets sinus headaches headache.  She does not use anything to help her stay awake.  Previously used trazodone.  Unclear why this was stopped.   She denies sleep walking, sleep talking, bruxism, or nightmares.  There is no history of restless legs.  She denies sleep hallucinations, sleep paralysis, or cataplexy.   The Epworth score is 6 out of 24.   She quit smoking in November 2021.  Uses to smoke 1 to 2 packs per day.  She was previously told she has COPD.  Had a nebulizer machine.  She has allergies to trees, grasses, and pollen.  Spring and Fall are worse times of year for her.  She gets sinus congestion and pressure.  For past several  weeks her breathing has been very tight.  She is having cough and sometimes brings up sputum.  Has episodes of wheezing.  Gets short of breath with minimal activity.   5/24/2022[name], np Patient presents today for follow-up with PFTs. Increased shortness of breath and light headedness from breathing test. Reported history of COPD, asthma and alelrgies. Treated for acute asthmatic bronchitis during last visit with steriods and started on Breo/prn albuterol. She has symptoms of dry cough, dyspnea with activities, chest tightness and wheezing. She has difficulty using both her BREO ellipta and albuterol hfa inhalers. PFTs today showed mild restrictoin with positive BD response. Normal diffusion capacity.  She recently started smoking again 2-3 weeks ago.  She is following with [facility] for syncope and neurology for headache/neck pain.   07/30/2020- interim hx  Patient contacted today for 1 month follow-up/virtual. During last visit patient was switch from [facility] to nebulized Budesonide + Perforomist. She was able to pick up pulmicort nebulizer. Insurance is not covering nebulized long acting bronchodilator. She finds the nebulizer much easier to use than Ellipta or HFA inhaler. She is waking up coughing and gasping for air at night. She is compliant with taking Protonix. Awaiting HST to be scheduled.  Pulmonary testing 06/29/2020 PFTs- FVC 2.98 (94%), FEV1 2.52 (99%), ratio 85, TLC 90, DLCOunc 21.59 (95%) Mild restriction with positive BD response. Normal diffusion capacity.   06/29/2020 FENO - 15  Observations/Objective:  - Able to speak in full sentences; no overt sob, wheezing or cough  Assessment and Plan:  Mild intermittent asthma: - PFT in May 2022 showed mild restriction with postive BD response  - Patient has difficulty using Ellipta and HFA inhalers  - Continue Budesonide 0.25mg /60ml TWICE daily; working on getting perforomist approved - Adding albuterol nebulizer q6 hours as needed  for shortness of breath/wheezing - Sending DME order for aerochamber to use with albuterol HFA when not at home   Snoring: - Suspected underlying sleep apnea  - I will speak with our patient care coordinator to see about scheduling patient's home sleep study   Nocturnal cough: - Likely GERD related  - Patient is complaint with Protonix 40mg  daily - Reinforced importance of following GERD diet, may consider adding famotidine at bedtime if still occurring   Follow Up Instructions:  3 months with Dr. Halford Chessman    I discussed the assessment and treatment plan with the patient. The patient was provided an opportunity to ask questions and all were answered. The patient agreed with the plan and demonstrated an understanding of the instructions.   The patient was advised to call back or seek an in-person evaluation if the symptoms worsen or if the condition fails to improve as anticipated.  I provided 25 minutes of non-face-to-face time during this encounter.   Martyn Ehrich, NP

## 2020-07-30 NOTE — Telephone Encounter (Signed)
Patient can pick up in [place]

## 2020-07-30 NOTE — Patient Instructions (Signed)
-   Continue Budesonide 0.25mg /79ml TWICE daily; working on getting perforomist approved - Adding albuterol nebulizer q6 hours as needed for shortness of breath/wheezing

## 2020-08-02 NOTE — Progress Notes (Signed)
Reviewed and agree with assessment/plan.   Stefen Juba, MD [facility] 08/02/2020, 3:24 PM Pager:  [phone]  

## 2020-08-03 NOTE — Telephone Encounter (Signed)
HST still needing precert.  I put note on order pt has seen [facility] in [place].  Nothing further needed at this time.

## 2020-09-02 ENCOUNTER — Other Ambulatory Visit: Payer: Self-pay

## 2020-09-02 ENCOUNTER — Ambulatory Visit: Payer: 59

## 2020-09-02 DIAGNOSIS — R0683 Snoring: Secondary | ICD-10-CM

## 2020-09-07 ENCOUNTER — Telehealth: Payer: Self-pay | Admitting: Pulmonary Disease

## 2020-09-07 DIAGNOSIS — G4733 Obstructive sleep apnea (adult) (pediatric): Secondary | ICD-10-CM | POA: Diagnosis not present

## 2020-09-07 NOTE — Telephone Encounter (Signed)
HST 09/02/20 >> AHI 7.8, SpO2 low 85%   Please inform her that her sleep study shows mild obstructive sleep apnea.  Please arrange for ROV with me or NP to discuss treatment options.

## 2020-09-08 NOTE — Telephone Encounter (Signed)
Called and spoke with patient to let her know HST results. She expressed understanding and has been scheduled for follow up. She was scheduled for follow up on 10/29/20 but state she was not aware that it was in [place] and is not willing to drive there as they are not familiar with it and don't feel comfortable. I advised  patient that I would keep her information and if something came open sooner here in [place] we would call her to get her in. She expressed understanding. Nothing further needed at this time.

## 2020-09-28 ENCOUNTER — Telehealth: Payer: Self-pay | Admitting: Pulmonary Disease

## 2020-09-28 NOTE — Telephone Encounter (Signed)
Pt returning missed call [phone]

## 2020-09-28 NOTE — Telephone Encounter (Signed)
Called and spoke with patient. Advised her of her HST results and of her upcoming appointment for October. I offered her a sooner appointment in [place], but she states her car isn't running well and she would like to keep the appointment in [place] for now. She states that she will call back if she can find better transportation to come to [place].  Nothing further needed at this time.

## 2020-09-28 NOTE — Telephone Encounter (Signed)
ATC Patient.  No answer and unable to leave message, because mailbox is currently full. Will try again at a later time.

## 2020-10-19 ENCOUNTER — Encounter: Payer: Self-pay | Admitting: Pulmonary Disease

## 2020-10-19 ENCOUNTER — Ambulatory Visit (INDEPENDENT_AMBULATORY_CARE_PROVIDER_SITE_OTHER): Payer: 59 | Admitting: Pulmonary Disease

## 2020-10-19 ENCOUNTER — Other Ambulatory Visit: Payer: Self-pay

## 2020-10-19 VITALS — BP 110/82 | HR 96 | Temp 98.9°F | Ht 67.0 in | Wt 245.0 lb

## 2020-10-19 DIAGNOSIS — R059 Cough, unspecified: Secondary | ICD-10-CM | POA: Diagnosis not present

## 2020-10-19 DIAGNOSIS — G4733 Obstructive sleep apnea (adult) (pediatric): Secondary | ICD-10-CM

## 2020-10-19 MED ORDER — BUDESONIDE 0.5 MG/2ML IN SUSP: 0.5000 mg | Freq: Two times a day (BID) | RESPIRATORY_TRACT | 12 refills | Status: DC

## 2020-10-19 MED ORDER — MONTELUKAST SODIUM 10 MG PO TABS
10.0000 mg | ORAL_TABLET | Freq: Every day | ORAL | 5 refills | Status: DC
Start: 2020-10-19 — End: 2021-06-10

## 2020-10-19 NOTE — Patient Instructions (Signed)
Chest xray today  Change budesonide to 0.5 mg nebulized in the morning and 0.5 mg nebulized in the evening, and rinse your mouth after each use  Perforomist one vial nebulized in the morning and one vial nebulized in the evening  Albuterol every 6 hours as needed for cough, wheeze, or chest congestion  Singulair 10 mg pill nightly  Will arrange for auto CPAP set up   Follow up in 3 months

## 2020-10-19 NOTE — Progress Notes (Signed)
[facility]  Chief Complaint  Patient presents with   Follow-up    Sob has increased since last visit. States that when she is walking from one end of her house to the other her heart rate increases and her sob is worse.     Constitutional:  BP 110/82 (BP Location: Left Arm, Patient Position: Sitting)   Pulse 96   Temp 98.9 F (37.2 C) (Oral)   Ht '5\' 7"'$  (1.702 m)   Wt 245 lb 0.6 oz (111.1 kg)   SpO2 100%   BMI 38.38 kg/m   Past Medical History:  HTN, CKD, DM 2, GERD, Ovarian cancer, PSTD, Seizurses, Substance abuse, Anxiety, Allergies, OA  Past Surgical History:  She  has a past surgical history that includes Partial hysterectomy; Cholecystectomy; Tonsillectomy; Hernia repair; Colonoscopy (03/01/2011); Esophagogastroduodenoscopy (2008); Esophagogastroduodenoscopy (2012); Esophagogastroduodenoscopy (N/A, 11/17/2013); Skin cancer excision; Abdominal hysterectomy; Colonoscopy with propofol (N/A, 08/08/2018); Esophagogastroduodenoscopy (egd) with propofol (N/A, 08/08/2018); maloney dilation (08/08/2018); and Knee arthroscopy.  Brief Summary:  Jennifer [name] is a 50 y.o. female former smoker with asthma, and obstructive sleep apnea.      Subjective:   She was having more trouble with cough and chest congestion.  Was coughing up blood.  This has improved, but still bringing up phlegm.  Has intermittent wheeze.  No fever.  Has some sinus congestion.    She had home sleep study.  This showed mild sleep apnea.  She is still having trouble with her sleep and feeling sleepy during the day.   Physical Exam:   Appearance - well kempt   ENMT - no sinus tenderness, no oral exudate, no LAN, Mallampati 3 airway, no stridor  Respiratory - equal breath sounds bilaterally, no wheezing or rales  CV - s1s2 regular rate and rhythm, no murmurs  Ext - no clubbing, no edema  Skin - no rashes  Psych - normal mood and affect    Pulmonary testing:  PFT  06/29/20 >> FEV1 2.52 (99%), FEV1% 85, TLC 4.87 (90%), DLCO 95%, +BD  Sleep Tests:  PSG 04/29/13 >> AHI 0.4, SpO2 low 89% HST 09/02/20 >> AHI 7.8, SpO2 low 85%  Social History:  She  reports that she has been smoking cigarettes. She started smoking about 36 years ago. She has a 39.00 pack-year smoking history. She has never used smokeless tobacco. She reports current alcohol use. She reports current drug use. Drug: Marijuana.  Family History:  Her family history includes Alcohol abuse in her father, maternal uncle, mother, paternal grandmother, and paternal uncle; Arthritis in her mother; Breast cancer in her maternal grandmother; Breast cancer (age of onset: 31) in her daughter; COPD in her mother; Cancer in her maternal grandfather, maternal grandmother, paternal grandfather, and paternal grandmother; Colon cancer in her maternal grandfather; Colon polyps (age of onset: 32) in her mother; Crohn's disease in her cousin and maternal aunt; Depression in her mother; Drug abuse in her father, mother, and paternal uncle; Early death (age of onset: 19) in her father; Heart disease in her mother; Hyperlipidemia in her mother; Hypertension in her mother; Learning disabilities in her father and mother; Mental illness in her mother; Pancreatic cancer in her maternal grandfather.     Assessment/Plan:   Allergic asthma. - had recent exacerbation associated with episodes of hemoptysis - chest xray today - don't think she needs antibiotics or prednisone at this time - had difficulty with DPI and HFA inhalers - will change budesonide to 0.5 mg  bid - continue perforomist, singulair, zyrtec, flonase - prn albuterol  Obstructive sleep apnea. - sleep study reviewed - treatment options discussed - will arrange for auto CPAP 5 to 15 cm H2O  Syncope. - she has follow up with Dr. Carlyle Dolly with [facility]  Obesity. - discussed how weight can impact sleep and risk for sleep disordered breathing -  discussed options to assist with weight loss: combination of diet modification, cardiovascular and strength training exercises   Time Spent Involved in Patient Care on Day of Examination:  37 minutes  Follow up:   Patient Instructions  Chest xray today  Change budesonide to 0.5 mg nebulized in the morning and 0.5 mg nebulized in the evening, and rinse your mouth after each use  Perforomist one vial nebulized in the morning and one vial nebulized in the evening  Albuterol every 6 hours as needed for cough, wheeze, or chest congestion  Singulair 10 mg pill nightly  Will arrange for auto CPAP set up   Follow up in 3 months  Medication List:   Allergies as of 10/19/2020       Reactions   Metrizamide Rash, Swelling   Hands swelling/rash   Shellfish Allergy Swelling   Swells tongue   Amoxapine Nausea Only   Amoxapine And Related    Nausea   Codeine Other (See Comments)   itching   Hydrocodone Itching   Rash and itching   Ivp Dye [iodinated Diagnostic Agents] Rash   Hands swelling/rash   Latex Rash        Medication List        Accurate as of October 19, 2020  3:58 PM. If you have any questions, ask your nurse or doctor.          STOP taking these medications    budesonide 0.25 MG/2ML nebulizer solution Commonly known as: PULMICORT Replaced by: budesonide 0.5 MG/2ML nebulizer solution Stopped by: Chesley Mires, MD       TAKE these medications    albuterol 108 (90 Base) MCG/ACT inhaler Commonly known as: VENTOLIN HFA Inhale 2 puffs into the lungs every 6 (six) hours as needed for wheezing or shortness of breath.   albuterol (2.5 MG/3ML) 0.083% nebulizer solution Commonly known as: PROVENTIL Take 3 mLs (2.5 mg total) by nebulization every 6 (six) hours as needed for wheezing or shortness of breath.   budesonide 0.5 MG/2ML nebulizer solution Commonly known as: Pulmicort Take 2 mLs (0.5 mg total) by nebulization 2 (two) times daily. Replaces:  budesonide 0.25 MG/2ML nebulizer solution Started by: Chesley Mires, MD   cetirizine 10 MG tablet Commonly known as: ZYRTEC Take 1 tablet (10 mg total) by mouth every morning.   EPINEPHrine 0.15 MG/0.15ML injection Commonly known as: ADRENACLICK Inject [id number] mg into the muscle as needed for anaphylaxis.   fluorometholone 0.1 % ophthalmic suspension Commonly known as: FML Place 1 drop into the left eye in the morning, at noon, and at bedtime.   fluticasone 50 MCG/ACT nasal spray Commonly known as: FLONASE Place 1 spray into both nostrils daily. What changed:  when to take this reasons to take this   formoterol 20 MCG/2ML nebulizer solution Commonly known as: Perforomist Take 2 mLs (20 mcg total) by nebulization 2 (two) times daily.   gabapentin 300 MG capsule Commonly known as: NEURONTIN Take 300 mg by mouth 2 (two) times daily.   lamoTRIgine 200 MG tablet Commonly known as: LAMICTAL Take 200 mg by mouth daily.   Levetiracetam 750 MG  Tb24 Take 3 tablets (2,250 mg total) by mouth at bedtime.   linaclotide 145 MCG Caps capsule Commonly known as: Linzess Take 1 capsule (145 mcg total) by mouth daily before breakfast.   methocarbamol 750 MG tablet Commonly known as: ROBAXIN Take 750 mg by mouth 2 (two) times daily as needed.   montelukast 10 MG tablet Commonly known as: SINGULAIR Take 1 tablet (10 mg total) by mouth at bedtime. Started by: Chesley Mires, MD   ondansetron 4 MG tablet Commonly known as: ZOFRAN Take 4 mg by mouth every 8 (eight) hours as needed for nausea or vomiting.   pantoprazole 40 MG tablet Commonly known as: PROTONIX TAKE 1 TABLET BY MOUTH TWICE DAILY BEFORE MEALS.   propranolol 10 MG tablet Commonly known as: INDERAL Take 0.5 tablets (5 mg total) by mouth 2 (two) times daily.   Refresh Relieva 0.5-0.9 % ophthalmic solution Generic drug: carboxymethylcellul-glycerin Place 1 drop into the left eye daily.   Vitamin D (Ergocalciferol) 1.25 MG  (50000 UNIT) Caps capsule Commonly known as: DRISDOL Take 1 capsule (50,000 Units total) by mouth every 7 (seven) days.        Signature:  Chesley Mires, MD [place] Pager - [phone] 10/19/2020, 3:58 PM

## 2020-10-21 ENCOUNTER — Ambulatory Visit (HOSPITAL_COMMUNITY)
Admission: RE | Admit: 2020-10-21 | Discharge: 2020-10-21 | Disposition: A | Discharge status: Home / Self Care | Payer: 59 | Source: Ambulatory Visit | Attending: Pulmonary Disease | Admitting: Pulmonary Disease

## 2020-10-21 ENCOUNTER — Other Ambulatory Visit: Payer: Self-pay

## 2020-10-21 DIAGNOSIS — R059 Cough, unspecified: Secondary | ICD-10-CM | POA: Insufficient documentation

## 2020-10-29 ENCOUNTER — Ambulatory Visit: Payer: 59 | Admitting: Pulmonary Disease

## 2020-11-05 ENCOUNTER — Telehealth: Payer: Self-pay | Admitting: Pulmonary Disease

## 2020-11-05 NOTE — Telephone Encounter (Signed)
Per Leory Plowman with [employer] they needed some additional information. All information given and [facility] has everything they need.   Call made to patient, confirmed DOB. Made aware order is being processed. Voiced understanding.   Nothing further needed at this time.

## 2020-11-05 NOTE — Telephone Encounter (Signed)
Sent message to [facility] with [employer]. Will hold in triage until I hear back from him.

## 2020-11-09 ENCOUNTER — Ambulatory Visit: Payer: 59 | Admitting: Pulmonary Disease

## 2020-12-20 ENCOUNTER — Telehealth: Payer: Self-pay | Admitting: Pulmonary Disease

## 2020-12-21 NOTE — Telephone Encounter (Signed)
Call made to patient, confirmed DOB. Made aware we received her message. Made aware we would give her a call back once we spoke with [employer]. Voiced understanding.   Community message sent to [employer]. Will update message once we hear back.

## 2020-12-21 NOTE — Telephone Encounter (Signed)
New, Rosana Fret, [place], LPN; [place], Geanie Logan,   The order assigned to scheduling but that all I seen. I messaged the scheduling team for that area for them to provide me and the patient and update. I will let you know when I get a reply.   Thank you,   Leroy [place]    Will await update.

## 2020-12-22 NOTE — Telephone Encounter (Signed)
New, Jennifer [name], [place], LPN; Reola Calkins, [place],   I received a reply back from out scheduling team:   This patient is currently in Scheduling with an AHI of 7.8.  I will call her and give her scheduling/update.     Thank you,   Brad New     [employer] to contact patient.   Nothing further needed at this time.

## 2020-12-27 ENCOUNTER — Ambulatory Visit: Payer: 59 | Admitting: Orthopedic Surgery

## 2021-01-06 ENCOUNTER — Ambulatory Visit (INDEPENDENT_AMBULATORY_CARE_PROVIDER_SITE_OTHER): Payer: 59 | Admitting: Orthopedic Surgery

## 2021-01-06 ENCOUNTER — Other Ambulatory Visit: Payer: Self-pay

## 2021-01-06 ENCOUNTER — Encounter: Payer: Self-pay | Admitting: Orthopedic Surgery

## 2021-01-06 DIAGNOSIS — M25562 Pain in left knee: Secondary | ICD-10-CM

## 2021-01-06 DIAGNOSIS — M25561 Pain in right knee: Secondary | ICD-10-CM | POA: Diagnosis not present

## 2021-01-06 DIAGNOSIS — M170 Bilateral primary osteoarthritis of knee: Secondary | ICD-10-CM

## 2021-01-06 DIAGNOSIS — G8929 Other chronic pain: Secondary | ICD-10-CM | POA: Diagnosis not present

## 2021-01-06 DIAGNOSIS — M1710 Unilateral primary osteoarthritis, unspecified knee: Secondary | ICD-10-CM

## 2021-01-06 NOTE — Progress Notes (Signed)
Chief Complaint  Patient presents with   Injections    Bilateral knee / wants injection, also states knees "catching" wants to know what is causing them to "catch"   Procedure note  Injection  Verbal consent was obtained to inject the  right knee  Timeout procedure was completed to confirm injection site  Diagnosis oa right knee   Medications used depomedrol Lidocaine 1% plain 3 cc  Anesthesia was provided by ethyl chloride spray  Prep was performed with alcohol  Technique of injection  injection thru lateral portal of right knee    No complications were noted Procedure note  Injection  Verbal consent was obtained to inject the  ;eft knee   Timeout procedure was completed to confirm injection site  Diagnosis oa left knee   Medications used Depomedrol 40 mg  Lidocaine 1% plain 3 cc  Anesthesia was provided by ethyl chloride spray  Prep was performed with alcohol  Technique of injection  knee flexed 90, lateral inj   No complications were noted

## 2021-02-10 ENCOUNTER — Telehealth: Payer: Self-pay | Admitting: Pulmonary Disease

## 2021-02-10 NOTE — Telephone Encounter (Signed)
Called pt to follow-up for her follow-up appt and she has not received her CPAP machine.  Stated last time she s/w someone was in September.  Order sent and received at [facility].  Please advise. .   Called and left a VM for Melissa from [employer] and also faxed over request for her to look into situation.

## 2021-02-15 ENCOUNTER — Other Ambulatory Visit (HOSPITAL_COMMUNITY): Payer: Self-pay | Admitting: Family Medicine

## 2021-02-15 DIAGNOSIS — Z1231 Encounter for screening mammogram for malignant neoplasm of breast: Secondary | ICD-10-CM

## 2021-02-18 NOTE — Telephone Encounter (Signed)
Called to follow up with patient who states she still has not heard anything form [facility] regarding her CPAP machine.   LVM for melissa from  [employer] to call me back when she has a chance so we can talk about the patient and figure out how to help her.   Will also route this message to [facility] form [employer] to look further into situation.

## 2021-02-18 NOTE — Telephone Encounter (Signed)
Spoke to Risco form [employer]. She is going to check in on patient and get her situated with cpap.   Called and notified patient and she is going to call back if she doesn't hear from them next week.

## 2021-02-18 NOTE — Telephone Encounter (Signed)
Patient also stated on phone that insurance has now changed from [facility] to [employer].  Her new member ID with plan is [id number]

## 2021-03-01 ENCOUNTER — Telehealth: Payer: Self-pay | Admitting: Internal Medicine

## 2021-03-01 NOTE — Telephone Encounter (Signed)
Called patient and found out that her cpap machine is no longer working and when she called [employer] come to find out she has to pay out of pocket due to her insurance. But pt states this order was placed for a cpap last year and [facility] just got with her for a cpap fitting. Called [place] and asked her to look into this and see what we can do.

## 2021-03-07 ENCOUNTER — Telehealth: Payer: Self-pay | Admitting: Internal Medicine

## 2021-03-07 ENCOUNTER — Telehealth: Payer: Self-pay | Admitting: Gastroenterology

## 2021-03-07 NOTE — Telephone Encounter (Signed)
PLEASE CALL PATIENT, SHE IS HAVING STOMACH ISSUS AFTER SHE EATS.  ABOUT 10 MINUTES AFTER SHE IS DONE EATING SHE HAS STOMACH GROWLING AND THEN DIARRHEA

## 2021-03-07 NOTE — Telephone Encounter (Signed)
Her insurance information:  Subscriber Number: [id number] Group Number: Individual [employer]

## 2021-03-07 NOTE — Telephone Encounter (Signed)
Returned the pt's call and was advised that for the past 3 or 4 days she has had diarrhea back to back. She states there was a clear coat in the stool and some blood. She is having diarrhea everytime she eats (up to four bouts of it.) She noticed at the bottom of her stomach it is bloated but after she uses the bathroom it eases up (bloating). Her anal area is very sore due to her having diarrhea. No n/v noted. Advised that her roommate is getting shots for the same thing and she didn't start this until her roommate started. I advised the pt I cannot comment on her roommate or her at this point. She was suppose to come back in 3 to 4 months after her Dec 2021 visit and she did not. I advised her that she may have to come in for a visit before the Dr can/will advise on this. I advised her when the pt has not been seen in that long it is hard to say or advise on what's going on. Please advise.

## 2021-03-08 ENCOUNTER — Telehealth: Payer: Self-pay | Admitting: Internal Medicine

## 2021-03-08 DIAGNOSIS — G4733 Obstructive sleep apnea (adult) (pediatric): Secondary | ICD-10-CM

## 2021-03-08 NOTE — Telephone Encounter (Signed)
Order was sent to [facility] in Sept.  I called pt and asked if she had received cpap machine and she states she has.  I told her she would need to give new insurance info to [employer].  They file insurance for the cpap.  She states she has their phone #.  Nothing further needed.

## 2021-03-08 NOTE — Telephone Encounter (Signed)
Phoned the pt vm full

## 2021-03-08 NOTE — Telephone Encounter (Signed)
Make sure not taking Linzess while having diarrhea if she is still taking.  It sounds like roommate may have had some type of diarrheal illness but unclear.   We can check GI path panel and Cdiff. Keep upcoming appt.

## 2021-03-09 ENCOUNTER — Other Ambulatory Visit: Payer: Self-pay

## 2021-03-09 ENCOUNTER — Ambulatory Visit (INDEPENDENT_AMBULATORY_CARE_PROVIDER_SITE_OTHER): Payer: 59 | Admitting: Orthopedic Surgery

## 2021-03-09 DIAGNOSIS — M25562 Pain in left knee: Secondary | ICD-10-CM

## 2021-03-09 DIAGNOSIS — G8929 Other chronic pain: Secondary | ICD-10-CM

## 2021-03-09 DIAGNOSIS — M25561 Pain in right knee: Secondary | ICD-10-CM

## 2021-03-09 NOTE — Progress Notes (Signed)
Chief Complaint  Patient presents with   Knee Pain    Bilat/ wants injections   Jennifer [name] is 51 years old her weight is 245 she has bilateral knee pain osteoarthritis currently not a surgical candidate   Bilateral knee pain bilateral injections requested  Procedure note for bilateral knee injections  Procedure note left knee injection verbal consent was obtained to inject left knee joint  Timeout was completed to confirm the site of injection  The medications used were 40 mg depomedrol and 3 cc of 1% lidocaine  Anesthesia was provided by ethyl chloride and the skin was prepped with alcohol.  After cleaning the skin with alcohol a 20-gauge needle was used to inject the left knee joint. There were no complications. A sterile bandage was applied.   Procedure note right knee injection verbal consent was obtained to inject right knee joint  Timeout was completed to confirm the site of injection  The medications used were 40 mg depomedrol and 3 cc of 1% lidocaine  Anesthesia was provided by ethyl chloride and the skin was prepped with alcohol.  After cleaning the skin with alcohol a 20-gauge needle was used to inject the right knee joint. There were no complications. A sterile bandage was applied.

## 2021-03-09 NOTE — Telephone Encounter (Signed)
We ordered CPAP in Sept 2022- sent to [facility]  Pt says her insurance does not cover [facility] and needs another DME PCC's can you please help with this?

## 2021-03-09 NOTE — Telephone Encounter (Signed)
I have placed a new order for CPAP for a different DME

## 2021-03-09 NOTE — Telephone Encounter (Signed)
Due to the length of time of the original order, please prepare new order & document not to send to [facility] on that order, please.

## 2021-03-10 NOTE — Telephone Encounter (Signed)
FYI:  Phoned and spoke with the pt and was advised she had been taking Linzess the whole time, but the pt stated that her symptoms have stopped and advised her to keep appt. Pt expressed understanding

## 2021-03-18 ENCOUNTER — Other Ambulatory Visit: Payer: Self-pay | Admitting: Nurse Practitioner

## 2021-03-31 ENCOUNTER — Ambulatory Visit: Payer: Medicaid Other | Admitting: Internal Medicine

## 2021-04-11 ENCOUNTER — Other Ambulatory Visit: Payer: Self-pay | Admitting: Pulmonary Disease

## 2021-04-12 ENCOUNTER — Telehealth (INDEPENDENT_AMBULATORY_CARE_PROVIDER_SITE_OTHER): Payer: 59 | Admitting: Gastroenterology

## 2021-04-12 ENCOUNTER — Telehealth: Payer: Self-pay | Admitting: *Deleted

## 2021-04-12 ENCOUNTER — Encounter: Payer: Self-pay | Admitting: Gastroenterology

## 2021-04-12 VITALS — Ht 66.0 in | Wt 225.0 lb

## 2021-04-12 DIAGNOSIS — R1084 Generalized abdominal pain: Secondary | ICD-10-CM | POA: Diagnosis not present

## 2021-04-12 MED ORDER — ONDANSETRON HCL 4 MG PO TABS: 4.0000 mg | ORAL_TABLET | Freq: Three times a day (TID) | ORAL | 3 refills | Status: DC | PRN

## 2021-04-12 NOTE — Telephone Encounter (Signed)
PA approved. Auth# [id number], DOS 04/12/2021-07/13/2021 ?

## 2021-04-12 NOTE — Patient Instructions (Signed)
We are arranging an upper endoscopy with dilation by Dr. Gala Romney in the near future ? ?I have ordered a CT scan. ? ?I sent in Zofran to take every 8 hours as needed for nausea. ? ?Please have labs done. ? ?We will see you in 3 months! ? ?I enjoyed seeing you again today! As you know, I value our relationship and want to provide genuine, compassionate, and quality care. I welcome your feedback. If you receive a survey regarding your visit,  I greatly appreciate you taking time to fill this out. See you next time! ? ?Annitta Needs, PhD, ANP-BC ?[facility]  ? ?

## 2021-04-12 NOTE — Progress Notes (Signed)
Primary Care Physician:  Olga Coaster, FNP  Primary GI: Dr. Gala Romney   Patient Location: Home   Provider Location: [facility] office   Reason for Visit: Follow-up   Persons present on the virtual encounter, with roles: Patient, NP, CMA   Total time (minutes) spent on medical discussion: 15 minutes   Due to COVID-19, visit was conducted using virtual method.  Visit was requested by patient.  Virtual Visit via MyChart Video Note Due to COVID-19, visit is conducted virtually and was requested by patient.   I connected with Jennifer [name] on 04/12/21 at 10:30 AM EST by video and verified that I am speaking with the correct person using two identifiers.   I discussed the limitations, risks, security and privacy concerns of performing an evaluation and management service by video and the availability of in person appointments. I also discussed with the patient that there may be a patient responsible charge related to this service. The patient expressed understanding and agreed to proceed.  Chief Complaint  Patient presents with   Follow-up    After bowel movements stomach is sore and nauseated      History of Present Illness:  51 year old female with history of constipation and GERD, s/p hemorrhoid banding 2019.GES normal in the past. EGD and colonoscopy on file from July 2020. Presenting now via video due to abdominal pain.   Felt sore under stomach, below navel this morning. Feels a little nauseated. Nauseated for awhile. Coming and going. Nauseated for a few months. Was having diarrhea but now resolved. Having 2 BMs per day. Was doing fiber powder but stopped this. Now coffee and lemon, which sends her to bathroom. Linzess not covered by insurance.   Pantoprazole BID. When has a BM, has some soreness in lower abdomen. Feels like still has to go but can't go. Feels like BM is a large amount. Cramping in left side before bowel movement. BMs are in between mushy and hard. No  fever/chills. No rectal bleeding.   Nausea: always there. Sometimes nauseated after eating. Good appetite. Doesn't get full. No solid food dysphagia. Gets strangled with liquids. Globus sensation.   No urinary symptoms.     Past Medical History:  Diagnosis Date   Allergy    Anemia    Anxiety    Arthritis    neck   Asthma    Chronic kidney disease, stage 2 (mild) 01/21/2020   Diabetes mellitus without complication (HCC)    GERD (gastroesophageal reflux disease)    History of flexible sigmoidoscopy 1999   Normal to 60cm,   Hypertension    Neuromuscular disorder ([place])    Ovarian cancer ([place])    skin cancer   Panic attack    PTSD (post-traumatic stress disorder)    S/P endoscopy Feb 2012   Nl, s/p 56-[facility] dilator, biopsy benign   S/P endoscopy 2008   Mild gastritis   Seizures ([place])    Substance abuse ([place])    drug addiction     Past Surgical History:  Procedure Laterality Date   ABDOMINAL HYSTERECTOMY     ovarian tumors   CHOLECYSTECTOMY     COLONOSCOPY  03/01/2011   [place]-External hemorrhoids/otherwise normal rectum and colon.   COLONOSCOPY WITH PROPOFOL N/A 08/08/2018   entire examined colon is normal. The distal rectum and anal verge are normal on retroflexion view. Repeat in 5 years.    ESOPHAGOGASTRODUODENOSCOPY  2008   gastritis   ESOPHAGOGASTRODUODENOSCOPY  2012  Moderate sized hiatal hernia, non-H. pylori gastritis   ESOPHAGOGASTRODUODENOSCOPY N/A 11/17/2013   RMR: Mild erosive reflux esophagitis. Patulous EG junction   ESOPHAGOGASTRODUODENOSCOPY (EGD) WITH PROPOFOL N/A 08/08/2018   Erosive reflux esophagitis. Dilated. Small hiatal hernia. Otherwise normal.    HERNIA REPAIR     Incisional with mesh   KNEE ARTHROSCOPY     left knee surgery in [place]   MALONEY DILATION  08/08/2018   Procedure: MALONEY DILATION;  Surgeon: Daneil Dolin, MD;  Location: [facility] END[facility] SUITE;  Service: Endoscopy;;   PARTIAL HYSTERECTOMY     SKIN CANCER EXCISION     left  bicep   TONSILLECTOMY       Current Meds  Medication Sig   albuterol (PROVENTIL) (2.5 MG/3ML) 0.083% nebulizer solution Take 3 mLs (2.5 mg total) by nebulization every 6 (six) hours as needed for wheezing or shortness of breath.   albuterol (VENTOLIN HFA) 108 (90 Base) MCG/ACT inhaler Inhale 2 puffs into the lungs every 6 (six) hours as needed for wheezing or shortness of breath.   budesonide (PULMICORT) 0.5 MG/2ML nebulizer solution Take 2 mLs (0.5 mg total) by nebulization 2 (two) times daily.   cetirizine (ZYRTEC) 10 MG tablet Take 1 tablet (10 mg total) by mouth every morning.   EPINEPHrine 0.15 MG/0.15ML IJ injection Inject 0.15 mg into the muscle as needed for anaphylaxis.   fluticasone (FLONASE) 50 MCG/ACT nasal spray Place 1 spray into both nostrils daily.   formoterol (PERFOROMIST) 20 MCG/2ML nebulizer solution Take 2 mLs (20 mcg total) by nebulization 2 (two) times daily.   gabapentin (NEURONTIN) 300 MG capsule Take 400 mg by mouth 2 (two) times daily.   lamoTRIgine (LAMICTAL) 200 MG tablet Take 200 mg by mouth daily.   Levetiracetam 750 MG TB24 Take 3 tablets (2,250 mg total) by mouth at bedtime. (Patient taking differently: Take 500 mg by mouth at bedtime.)   montelukast (SINGULAIR) 10 MG tablet Take 1 tablet (10 mg total) by mouth at bedtime.   pantoprazole (PROTONIX) 40 MG tablet TAKE 1 TABLET BY MOUTH TWICE DAILY BEFORE MEALS.   risperiDONE (RISPERDAL) 1 MG tablet Take 1 tablet by mouth at bedtime.   sertraline (ZOLOFT) 25 MG tablet 1 tablet   Vitamin D, Ergocalciferol, (DRISDOL) 1.25 MG (50000 UT) CAPS capsule Take 1 capsule (50,000 Units total) by mouth every 7 (seven) days.     Family History  Problem Relation Age of Onset   Crohn's disease Maternal Aunt    Breast cancer Maternal Grandmother    Cancer Maternal Grandmother    Colon cancer Maternal Grandfather        78   Pancreatic cancer Maternal Grandfather    Cancer Maternal Grandfather    Crohn's disease Cousin         x 2 cousins   Breast cancer Daughter 69   Arthritis Mother    COPD Mother    Depression Mother    Drug abuse Mother    Heart disease Mother    Hypertension Mother    Hyperlipidemia Mother    Learning disabilities Mother    Mental illness Mother    Alcohol abuse Mother    Colon polyps Mother 30       tubular adenoma   Early death Father 38       murder   Alcohol abuse Father    Drug abuse Father    Learning disabilities Father    Cancer Paternal Grandmother    Alcohol abuse Paternal Grandmother  Cancer Paternal Grandfather    Alcohol abuse Maternal Uncle    Alcohol abuse Paternal Uncle    Drug abuse Paternal Uncle     Social History   Socioeconomic History   Marital status: Married    Spouse name: [name]   Number of children: Not on file   Years of education: Not on file   Highest education level: 11th grade  Occupational History   Occupation: disabled    Fish farm manager: NOT EMPLOYED  Tobacco Use   Smoking status: Some Days    Packs/day: 1.50    Years: 26.00    Pack years: 39.00    Types: Cigarettes    Start date: 05/03/1984    Last attempt to quit: 12/27/2019    Years since quitting: 1.2   Smokeless tobacco: Never   Tobacco comments:    Patient states she is smoking based off her mood.   Vaping Use   Vaping Use: Never used  Substance and Sexual Activity   Alcohol use: Yes    Comment: occassionally   Drug use: Yes    Types: Marijuana   Sexual activity: Not Currently    Birth control/protection: Surgical  Other Topics Concern   Not on file  Social History Narrative   Lives at home with her husband.   Right handed.    1-2 cups of coffee 3 times per wk.    Intermittent soda.   Social Determinants of Health   Financial Resource Strain: Not on file  Food Insecurity: Not on file  Transportation Needs: Not on file  Physical Activity: Not on file  Stress: Not on file  Social Connections: Not on file       Review of Systems: Gen: Denies fever,  chills, anorexia. Denies fatigue, weakness, weight loss.  CV: Denies chest pain, palpitations, syncope, peripheral edema, and claudication. Resp: Denies dyspnea at rest, cough, wheezing, coughing up blood, and pleurisy. GI: see HPI Derm: Denies rash, itching, dry skin Psych: Denies depression, anxiety, memory loss, confusion. No homicidal or suicidal ideation.  Heme: Denies bruising, bleeding, and enlarged lymph nodes.  Observations/Objective: No distress. Unable to perform physical exam due to video encounter.    Assessment and Plan: 51 year old female with history of constipation and GERD, s/p hemorrhoid banding 2019.GES normal in the past. EGD and colonoscopy on file from July 2020. Presenting now via video due to abdominal pain and also reporting a globus sensation.  Abdominal pain: difficult historian. Notes soreness below umbilicus. Does not appear to be constipated, although difficult to sort out at this point. No rectal bleeding. No recent imaging. Will do CT abd/pelvis without contrast as she has allergies to IV dye.   Nausea: prior GES negative. constant. Worsened with eating. Notes globus sensation. Strangled on liquids. No solid food dysphagia. Protonix BID. Last EGD in 2020. She has undergone dilation before with good result.    Follow Up Instructions:  Check CBC, CMP now CT abd/pelvis Zofran prn nausea Will pursue EGD/dilation with Dr. Gala Romney in near future after scan. Risks and benefits discussed in detail with stated understanding. Return in 3 months.       I discussed the assessment and treatment plan with the patient. The patient was provided an opportunity to ask questions and all were answered. The patient agreed with the plan and demonstrated an understanding of the instructions.   The patient was advised to call back or seek an in-person evaluation if the symptoms worsen or if the condition fails to improve as anticipated.  I provided 15 minutes of face-to-face  time during this MyChart Video encounter.  Annitta Needs, PhD, ANP-BC [facility]

## 2021-04-12 NOTE — Telephone Encounter (Signed)
Called pt and she is aware of CT appt details. She voiced understanding. ?

## 2021-04-12 NOTE — Telephone Encounter (Signed)
PA faxed to [employer] health plan for CT ?

## 2021-04-14 ENCOUNTER — Telehealth: Payer: Self-pay

## 2021-04-14 NOTE — Telephone Encounter (Signed)
Called pt this morning, EGD/DIL w/Propofol ASA 3 scheduled for 06/02/21 at 2:00pm. Orders entered. Pre-op appt 05/30/21. Appt letter mailed with procedure instructions. ?

## 2021-04-19 ENCOUNTER — Telehealth: Payer: Self-pay | Admitting: Internal Medicine

## 2021-04-19 NOTE — Telephone Encounter (Signed)
Pt is going to her PCP todayat 5pm and is going to have labs done. She wanted to know if the labs Roseanne Kaufman, NP wanted her to do can be done there and orders faxed. Please advise. ?[phone]  ?

## 2021-04-19 NOTE — Telephone Encounter (Signed)
Phoned and spoke with the pt and advised her that I would fax to her PCP's office. She advised me they used [facility]. Will call her PCP's office and get the fax number to be faxed. ?

## 2021-04-19 NOTE — Telephone Encounter (Signed)
Returned the pt's call and her mailbox was full ?

## 2021-04-19 NOTE — Telephone Encounter (Signed)
Lab forms faxed to the pt's PCP @ [phone]. ?

## 2021-04-20 ENCOUNTER — Ambulatory Visit (HOSPITAL_COMMUNITY)
Admission: RE | Admit: 2021-04-20 | Discharge: 2021-04-20 | Disposition: A | Discharge status: Home / Self Care | Payer: 59 | Source: Ambulatory Visit | Attending: Family Medicine | Admitting: Family Medicine

## 2021-04-20 ENCOUNTER — Other Ambulatory Visit: Payer: Self-pay | Admitting: Family Medicine

## 2021-04-20 ENCOUNTER — Other Ambulatory Visit (HOSPITAL_COMMUNITY): Payer: Self-pay | Admitting: Family Medicine

## 2021-04-20 ENCOUNTER — Other Ambulatory Visit: Payer: Self-pay

## 2021-04-20 DIAGNOSIS — M79605 Pain in left leg: Secondary | ICD-10-CM | POA: Diagnosis present

## 2021-04-20 DIAGNOSIS — M79606 Pain in leg, unspecified: Secondary | ICD-10-CM

## 2021-05-04 ENCOUNTER — Telehealth: Payer: Self-pay | Admitting: *Deleted

## 2021-05-04 NOTE — Telephone Encounter (Signed)
She is scheduled for 4/27 for EGD/dilation. Please see phone note dated 3/9. Letter was mailed. She may need further instructions.  ?

## 2021-05-04 NOTE — Telephone Encounter (Signed)
I don't see anything. Will ask anna ?

## 2021-05-04 NOTE — Telephone Encounter (Signed)
She is already scheduled. I don't see anything stating we needed to cancel though? ?

## 2021-05-04 NOTE — Telephone Encounter (Signed)
Spoke with Jennifer [name]. We have not cancelled anything. Procedure still as scheduled.  ? ?Called pt and she is aware.  ?

## 2021-05-04 NOTE — Telephone Encounter (Signed)
Patient called in to give new phone number --I have updated--rr ? ?She mentioned that she was told yesterday that she needed to have her throat looked at again and then was told we canceled it. ? ?I do not see any documentation about this to help her. ? ?Can someone please research and call patient back.  She seems to believe that this needed to be done. ? ?Thank  ? ?[phone] ?

## 2021-05-12 ENCOUNTER — Other Ambulatory Visit (HOSPITAL_COMMUNITY): Payer: Self-pay | Admitting: Neurology

## 2021-05-12 DIAGNOSIS — M25551 Pain in right hip: Secondary | ICD-10-CM

## 2021-05-12 DIAGNOSIS — M545 Low back pain, unspecified: Secondary | ICD-10-CM

## 2021-05-12 DIAGNOSIS — M25552 Pain in left hip: Secondary | ICD-10-CM

## 2021-05-19 ENCOUNTER — Ambulatory Visit: Payer: 59 | Admitting: Orthopedic Surgery

## 2021-05-20 ENCOUNTER — Ambulatory Visit (HOSPITAL_COMMUNITY): Payer: 59

## 2021-05-23 ENCOUNTER — Ambulatory Visit: Payer: 59 | Admitting: Orthopedic Surgery

## 2021-05-23 DIAGNOSIS — M25562 Pain in left knee: Secondary | ICD-10-CM

## 2021-05-23 DIAGNOSIS — G8929 Other chronic pain: Secondary | ICD-10-CM | POA: Diagnosis not present

## 2021-05-23 DIAGNOSIS — M1711 Unilateral primary osteoarthritis, right knee: Secondary | ICD-10-CM | POA: Diagnosis not present

## 2021-05-23 DIAGNOSIS — M1710 Unilateral primary osteoarthritis, unspecified knee: Secondary | ICD-10-CM

## 2021-05-23 NOTE — Progress Notes (Signed)
FOLLOW UP  ? ?Encounter Diagnoses  ?Name Primary?  ? Primary localized osteoarthritis of knee both knees Yes  ? Chronic pain of left knee   ? ? ? ?Chief Complaint  ?Patient presents with  ? Knee Pain  ?  Bilateral  R>L ?Right is hurting worse than left. Had xrays at [facility] ?Pt c/o knees buckling when trying to step sometimes  ? ? ? ?She came in for left knee pain  ? ?She had SALK 7-8 yrs ago  ?She says that she has a balance problem and she has catching in the left knee  ? ?But whats really going on is that she has weakness and giving way of both kneess  ? ?Exam reveals that both legs are weak but this does not manifest itself as weakness to MMT  ?The knees are stable on ligament exam ? ?The mri of the left and right knee only reveal mild OA (2021) ? ?I think she has a neurological problem and should see a neurologist or neurosurgeon after proper imaging has been done  ? ?She has a Lumbar spine film scheduled soon  ? ?It will be interesting to see what that shows as she also c/o back pain and constant shock like symptoms in both upper thighs  ? ?Encounter Diagnoses  ?Name Primary?  ? Primary localized osteoarthritis of knee both knees Yes  ? Chronic pain of left knee   ? ? ?I couldn't really help her with any of these problems and rec she see PMD for further work up  ?

## 2021-05-27 NOTE — Patient Instructions (Signed)
? ? ? ? ? ? ? ? Jennifer [name] ? 05/27/2021  ?  ? '@PREFPERIOPPHARMACY'$ @ ? ? Your procedure is scheduled on  06/02/2021. ? ? Report to [facility] at  1245  P.M. ? ? Call this number if you have problems the morning of surgery: ? [phone] ? ? Remember: ? Follow the diet instructions given to you by the office. ? ?   Use your nebulizer and your inhaler before you come and bring your rescue inhaler with you. ?  ?   DO NOT take any medications for diabetes the morning of your procedure. ? ? ? Take these medicines the morning of surgery with A SIP OF WATER  ? ?        zyrtec, gabapentin, lamictal, keppra, zofran (if needed), protonix, propranolol, zoloft. ?  ? ? Do not wear jewelry, make-up or nail polish. ? Do not wear lotions, powders, or perfumes, or deodorant. ? Do not shave 48 hours prior to surgery.  Men may shave face and neck. ? Do not bring valuables to the hospital. ? [facility] is not responsible for any belongings or valuables. ? ?Contacts, dentures or bridgework may not be worn into surgery.  Leave your suitcase in the car.  After surgery it may be brought to your room. ? ?For patients admitted to the hospital, discharge time will be determined by your treatment team. ? ?Patients discharged the day of surgery will not be allowed to drive home and must have someone with them for 24 hours.  ? ? ?Special instructions:   DO NOT smoke tobacco or vape for 24 hours before your procedure. ? ?Please read over the following fact sheets that you were given. ?Anesthesia Post-op Instructions and Care and Recovery After Surgery ?  ? ? ? Upper Endoscopy, Adult, Care After ?This sheet gives you information about how to care for yourself after your procedure. Your health care provider may also give you more specific instructions. If you have problems or questions, contact your health care provider. ?What can I expect after the procedure? ?After the procedure, it is common to have: ?A sore throat. ?Mild stomach pain or  discomfort. ?Bloating. ?Nausea. ?Follow these instructions at home: ? ?Follow instructions from your health care provider about what to eat or drink after your procedure. ?Return to your normal activities as told by your health care provider. Ask your health care provider what activities are safe for you. ?Take over-the-counter and prescription medicines only as told by your health care provider. ?If you were given a sedative during the procedure, it can affect you for several hours. Do not drive or operate machinery until your health care provider says that it is safe. ?Keep all follow-up visits as told by your health care provider. This is important. ?Contact a health care provider if you have: ?A sore throat that lasts longer than one day. ?Trouble swallowing. ?Get help right away if: ?You vomit blood or your vomit looks like coffee grounds. ?You have: ?A fever. ?Bloody, black, or tarry stools. ?A severe sore throat or you cannot swallow. ?Difficulty breathing. ?Severe pain in your chest or abdomen. ?Summary ?After the procedure, it is common to have a sore throat, mild stomach discomfort, bloating, and nausea. ?If you were given a sedative during the procedure, it can affect you for several hours. Do not drive or operate machinery until your health care provider says that it is safe. ?Follow instructions from your health care provider about what to eat  or drink after your procedure. ?Return to your normal activities as told by your health care provider. ?This information is not intended to replace advice given to you by your health care provider. Make sure you discuss any questions you have with your health care provider. ?Document Revised: 11/29/2018 Document Reviewed: 06/25/2017 ?Elsevier Patient Education ? [place]. ?Esophageal Dilatation ?Esophageal dilatation, also called esophageal dilation, is a procedure to widen or open a blocked or narrowed part of the esophagus. The esophagus is the part of  the body that moves food and liquid from the mouth to the stomach. You may need this procedure if: ?You have a buildup of scar tissue in your esophagus that makes it difficult, painful, or impossible to swallow. This can be caused by gastroesophageal reflux disease (GERD). ?You have cancer of the esophagus. ?There is a problem with how food moves through your esophagus. ?In some cases, you may need this procedure repeated at a later time to dilate the esophagus gradually. ?Tell a health care provider about: ?Any allergies you have. ?All medicines you are taking, including vitamins, herbs, eye drops, creams, and over-the-counter medicines. ?Any problems you or family members have had with anesthetic medicines. ?Any blood disorders you have. ?Any surgeries you have had. ?Any medical conditions you have. ?Any antibiotic medicines you are required to take before dental procedures. ?Whether you are pregnant or may be pregnant. ?What are the risks? ?Generally, this is a safe procedure. However, problems may occur, including: ?Bleeding due to a tear in the lining of the esophagus. ?A hole, or perforation, in the esophagus. ?What happens before the procedure? ?Ask your health care provider about: ?Changing or stopping your regular medicines. This is especially important if you are taking diabetes medicines or blood thinners. ?Taking medicines such as aspirin and ibuprofen. These medicines can thin your blood. Do not take these medicines unless your health care provider tells you to take them. ?Taking over-the-counter medicines, vitamins, herbs, and supplements. ?Follow instructions from your health care provider about eating or drinking restrictions. ?Plan to have a responsible adult take you home from the hospital or clinic. ?Plan to have a responsible adult care for you for the time you are told after you leave the hospital or clinic. This is important. ?What happens during the procedure? ?You may be given a medicine to  help you relax (sedative). ?A numbing medicine may be sprayed into the back of your throat, or you may gargle the medicine. ?Your health care provider may perform the dilatation using various surgical instruments, such as: ?Simple dilators. This instrument is carefully placed in the esophagus to stretch it. ?Guided wire bougies. This involves using an endoscope to insert a wire into the esophagus. A dilator is passed over this wire to enlarge the esophagus. Then the wire is removed. ?Balloon dilators. An endoscope with a small balloon is inserted into the esophagus. The balloon is inflated to stretch the esophagus and open it up. ?The procedure may vary among health care providers and hospitals. ?What can I expect after the procedure? ?Your blood pressure, heart rate, breathing rate, and blood oxygen level will be monitored until you leave the hospital or clinic. ?Your throat may feel slightly sore and numb. This will get better over time. ?You will not be allowed to eat or drink until your throat is no longer numb. ?When you are able to drink, urinate, and sit on the edge of the bed without nausea or dizziness, you may be able to return  home. ?Follow these instructions at home: ?Take over-the-counter and prescription medicines only as told by your health care provider. ?If you were given a sedative during the procedure, it can affect you for several hours. Do not drive or operate machinery until your health care provider says that it is safe. ?Plan to have a responsible adult care for you for the time you are told. This is important. ?Follow instructions from your health care provider about any eating or drinking restrictions. ?Do not use any products that contain nicotine or tobacco, such as cigarettes, e-cigarettes, and chewing tobacco. If you need help quitting, ask your health care provider. ?Keep all follow-up visits. This is important. ?Contact a health care provider if: ?You have a fever. ?You have pain that  is not relieved by medicine. ?Get help right away if: ?You have chest pain. ?You have trouble breathing. ?You have trouble swallowing. ?You vomit blood. ?You have black, tarry, or bloody stools. ?These sy

## 2021-05-30 ENCOUNTER — Encounter (HOSPITAL_COMMUNITY)
Admission: RE | Admit: 2021-05-30 | Discharge: 2021-05-30 | Disposition: A | Discharge status: Home / Self Care | Payer: 59 | Source: Ambulatory Visit | Attending: Internal Medicine | Admitting: Internal Medicine

## 2021-05-30 ENCOUNTER — Telehealth: Payer: Self-pay | Admitting: Pulmonary Disease

## 2021-05-30 ENCOUNTER — Ambulatory Visit: Payer: 59 | Admitting: Pulmonary Disease

## 2021-05-30 ENCOUNTER — Encounter (HOSPITAL_COMMUNITY): Payer: Self-pay

## 2021-05-30 ENCOUNTER — Encounter: Payer: Self-pay | Admitting: Pulmonary Disease

## 2021-05-30 DIAGNOSIS — D649 Anemia, unspecified: Secondary | ICD-10-CM

## 2021-05-30 DIAGNOSIS — D631 Anemia in chronic kidney disease: Secondary | ICD-10-CM | POA: Diagnosis not present

## 2021-05-30 DIAGNOSIS — N189 Chronic kidney disease, unspecified: Secondary | ICD-10-CM | POA: Diagnosis not present

## 2021-05-30 DIAGNOSIS — G4733 Obstructive sleep apnea (adult) (pediatric): Secondary | ICD-10-CM

## 2021-05-30 DIAGNOSIS — Z01818 Encounter for other preprocedural examination: Secondary | ICD-10-CM | POA: Insufficient documentation

## 2021-05-30 HISTORY — DX: Sleep apnea, unspecified: G47.30

## 2021-05-30 NOTE — Telephone Encounter (Signed)
Okay to arrange for CPAP mask refitting and get new CPAP supplies. ?

## 2021-05-30 NOTE — Telephone Encounter (Signed)
Patient returned call and confirmed letter to be sent to her was okay. Letter printed and mailed. Order placed for new CPAP mask. Nothing further needed at this time.  ?

## 2021-05-30 NOTE — Telephone Encounter (Signed)
Called and spoke to patient. Call was disconnected during conversation. Patient asked If we could text info of her DME to her. I explained we could not do that but offered to send a mychart message. Patient does not have mychart. Offered to mail a letter with info and line was disconnected.  ? ?Dr. Halford Chessman please advise if its okay to place an order for patient to get new cpap supplies? Thanks!  ?

## 2021-06-02 ENCOUNTER — Ambulatory Visit (HOSPITAL_COMMUNITY): Payer: 59 | Admitting: Anesthesiology

## 2021-06-02 ENCOUNTER — Ambulatory Visit (HOSPITAL_BASED_OUTPATIENT_CLINIC_OR_DEPARTMENT_OTHER): Payer: 59 | Admitting: Anesthesiology

## 2021-06-02 ENCOUNTER — Ambulatory Visit (HOSPITAL_COMMUNITY)
Admission: RE | Admit: 2021-06-02 | Discharge: 2021-06-02 | Disposition: A | Discharge status: Home / Self Care | Payer: 59 | Attending: Internal Medicine | Admitting: Internal Medicine

## 2021-06-02 ENCOUNTER — Encounter (HOSPITAL_COMMUNITY)
Admission: RE | Disposition: A | Discharge status: Home / Self Care | Payer: Self-pay | Source: Home / Self Care | Attending: Internal Medicine

## 2021-06-02 DIAGNOSIS — J45909 Unspecified asthma, uncomplicated: Secondary | ICD-10-CM | POA: Diagnosis not present

## 2021-06-02 DIAGNOSIS — F419 Anxiety disorder, unspecified: Secondary | ICD-10-CM | POA: Diagnosis not present

## 2021-06-02 DIAGNOSIS — F418 Other specified anxiety disorders: Secondary | ICD-10-CM | POA: Diagnosis not present

## 2021-06-02 DIAGNOSIS — E1122 Type 2 diabetes mellitus with diabetic chronic kidney disease: Secondary | ICD-10-CM | POA: Diagnosis not present

## 2021-06-02 DIAGNOSIS — F17210 Nicotine dependence, cigarettes, uncomplicated: Secondary | ICD-10-CM | POA: Diagnosis not present

## 2021-06-02 DIAGNOSIS — K449 Diaphragmatic hernia without obstruction or gangrene: Secondary | ICD-10-CM | POA: Diagnosis not present

## 2021-06-02 DIAGNOSIS — G4730 Sleep apnea, unspecified: Secondary | ICD-10-CM | POA: Insufficient documentation

## 2021-06-02 DIAGNOSIS — N182 Chronic kidney disease, stage 2 (mild): Secondary | ICD-10-CM | POA: Diagnosis not present

## 2021-06-02 DIAGNOSIS — I10 Essential (primary) hypertension: Secondary | ICD-10-CM

## 2021-06-02 DIAGNOSIS — K2100 Gastro-esophageal reflux disease with esophagitis, without bleeding: Secondary | ICD-10-CM | POA: Diagnosis not present

## 2021-06-02 DIAGNOSIS — I129 Hypertensive chronic kidney disease with stage 1 through stage 4 chronic kidney disease, or unspecified chronic kidney disease: Secondary | ICD-10-CM | POA: Diagnosis not present

## 2021-06-02 DIAGNOSIS — K2210 Ulcer of esophagus without bleeding: Secondary | ICD-10-CM | POA: Diagnosis not present

## 2021-06-02 DIAGNOSIS — F32A Depression, unspecified: Secondary | ICD-10-CM | POA: Insufficient documentation

## 2021-06-02 DIAGNOSIS — R1310 Dysphagia, unspecified: Secondary | ICD-10-CM | POA: Insufficient documentation

## 2021-06-02 DIAGNOSIS — Z8249 Family history of ischemic heart disease and other diseases of the circulatory system: Secondary | ICD-10-CM | POA: Insufficient documentation

## 2021-06-02 DIAGNOSIS — M1990 Unspecified osteoarthritis, unspecified site: Secondary | ICD-10-CM | POA: Insufficient documentation

## 2021-06-02 DIAGNOSIS — Z79899 Other long term (current) drug therapy: Secondary | ICD-10-CM | POA: Diagnosis not present

## 2021-06-02 DIAGNOSIS — Z7951 Long term (current) use of inhaled steroids: Secondary | ICD-10-CM | POA: Diagnosis not present

## 2021-06-02 HISTORY — PX: ESOPHAGOGASTRODUODENOSCOPY (EGD) WITH PROPOFOL: SHX5813

## 2021-06-02 HISTORY — PX: MALONEY DILATION: SHX5535

## 2021-06-02 LAB — GLUCOSE, CAPILLARY: Glucose-Capillary: 100 mg/dL — ABNORMAL HIGH (ref 70–99)

## 2021-06-02 SURGERY — ESOPHAGOGASTRODUODENOSCOPY (EGD) WITH PROPOFOL
Anesthesia: General

## 2021-06-02 MED ORDER — LACTATED RINGERS IV SOLN: INTRAVENOUS | Status: DC

## 2021-06-02 MED ORDER — METOCLOPRAMIDE HCL 5 MG/ML IJ SOLN
INTRAMUSCULAR | Status: AC
Start: 2021-06-02 — End: ?
  Filled 2021-06-02: qty 2

## 2021-06-02 MED ORDER — PROPOFOL 10 MG/ML IV BOLUS
INTRAVENOUS | Status: DC | PRN
  Administered 2021-06-02 (×2): 100 mg via INTRAVENOUS
  Administered 2021-06-02: 40 mg via INTRAVENOUS
  Administered 2021-06-02: 100 mg via INTRAVENOUS

## 2021-06-02 MED ORDER — LIDOCAINE HCL (CARDIAC) PF 50 MG/5ML IV SOSY
PREFILLED_SYRINGE | INTRAVENOUS | Status: DC | PRN
  Administered 2021-06-02: 50 mg via INTRAVENOUS

## 2021-06-02 MED ORDER — METOCLOPRAMIDE HCL 5 MG/ML IJ SOLN
10.0000 mg | Freq: Once | INTRAMUSCULAR | Status: AC
  Administered 2021-06-02: 10 mg via INTRAVENOUS

## 2021-06-02 NOTE — Transfer of Care (Signed)
Immediate Anesthesia Transfer of Care Note ? ?Patient: Jennifer [name] ? ?Procedure(s) Performed: ESOPHAGOGASTRODUODENOSCOPY (EGD) WITH PROPOFOL ?MALONEY DILATION ? ?Patient Location: Short Stay ? ?Anesthesia Type:General ? ?Level of Consciousness: awake and patient cooperative ? ?Airway & Oxygen Therapy: Patient Spontanous Breathing ? ?Post-op Assessment: Report given to RN and Post -op Vital signs reviewed and stable ? ?Post vital signs: Reviewed and stable ? ?Last Vitals:  ?Vitals Value Taken Time  ?BP 110/70 06/02/21 1526  ?Temp 36.4 ?C 06/02/21 1526  ?Pulse 80 1526  ?Resp 17 06/02/21 1526  ?SpO2 99 % 06/02/21 1526  ? ? ?Last Pain:  ?Vitals:  ? 06/02/21 1526  ?TempSrc: Oral  ?PainSc: 0-No pain  ?   ? ?Patients Stated Pain Goal: 5 (06/02/21 1244) ? ?Complications: No notable events documented. ?

## 2021-06-02 NOTE — Anesthesia Preprocedure Evaluation (Addendum)
Anesthesia Evaluation  ?Patient identified by MRN, date of birth, ID band ?Patient awake ? ? ? ?Reviewed: ?Allergy & Precautions, NPO status , Patient's Chart, lab work & pertinent test results, reviewed documented beta blocker date and time  ? ?Airway ?Mallampati: II ? ?TM Distance: >3 FB ?Neck ROM: Full ? ? ? Dental ? ?(+) Dental Advisory Given, Missing ?  ?Pulmonary ?asthma , sleep apnea , Current Smoker and Patient abstained from smoking.,  ?  ? ? ? ? ? ? ? Cardiovascular ?hypertension, Pt. on medications and Pt. on home beta blockers ?+ angina Normal cardiovascular exam ?Rhythm:Regular Rate:Normal ? ?Narrative & Impression ?? There was no ST segment deviation noted during stress. ?? This is a low risk study. ?? The left ventricular ejection fraction is normal (55-65%). ?? Small very mild anterior defect that likely represents mild differences in breast attenuation, cannot exclude very mild anterior ischemia. Either finding would support low risk. ? ? ?  ?Neuro/Psych ?Seizures -,  PSYCHIATRIC DISORDERS Anxiety Depression  Neuromuscular disease   ? GI/Hepatic ?hiatal hernia, GERD  Medicated and Poorly Controlled,(+)  ?  ? substance abuse ? marijuana use,   ?Endo/Other  ?diabetes, Well Controlled, Type 2, Oral Hypoglycemic Agents ? Renal/GU ?Renal InsufficiencyRenal disease  ?negative genitourinary ?  ?Musculoskeletal ? ?(+) Arthritis ,  ? Abdominal ?  ?Peds ?negative pediatric ROS ?(+)  Hematology ? ?(+) Blood dyscrasia, anemia ,   ?Anesthesia Other Findings ? ? Reproductive/Obstetrics ?negative OB ROS ? ?  ? ? ? ? ? ? ? ? ? ? ? ? ? ?  ?  ? ? ? ? ? ? ? ?Anesthesia Physical ?Anesthesia Plan ? ?ASA: 3 ? ?Anesthesia Plan: General  ? ?Post-op Pain Management: Minimal or no pain anticipated  ? ?Induction: Intravenous ? ?PONV Risk Score and Plan: Propofol infusion ? ?Airway Management Planned: Nasal Cannula and Natural Airway ? ?Additional Equipment:  ? ?Intra-op Plan:   ? ?Post-operative Plan:  ? ?Informed Consent: I have reviewed the patients History and Physical, chart, labs and discussed the procedure including the risks, benefits and alternatives for the proposed anesthesia with the patient or authorized representative who has indicated his/her understanding and acceptance.  ? ? ? ?Dental advisory given ? ?Plan Discussed with: CRNA and Surgeon ? ?Anesthesia Plan Comments:   ? ? ? ? ? ? ?Anesthesia Quick Evaluation ? ?

## 2021-06-02 NOTE — Anesthesia Procedure Notes (Signed)
Date/Time: 06/02/2021 3:01 PM ?Performed by: Vista Deck, CRNA ?Pre-anesthesia Checklist: Patient identified, Emergency Drugs available, Suction available, Timeout performed and Patient being monitored ?Patient Re-evaluated:Patient Re-evaluated prior to induction ?Oxygen Delivery Method: Nasal Cannula ? ? ? ? ?

## 2021-06-02 NOTE — Anesthesia Postprocedure Evaluation (Signed)
Anesthesia Post Note ? ?Patient: Jennifer [name] ? ?Procedure(s) Performed: ESOPHAGOGASTRODUODENOSCOPY (EGD) WITH PROPOFOL ?MALONEY DILATION ? ?Patient location during evaluation: Phase II ?Anesthesia Type: General ?Level of consciousness: awake and alert and oriented ?Pain management: pain level controlled ?Vital Signs Assessment: post-procedure vital signs reviewed and stable ?Respiratory status: spontaneous breathing, nonlabored ventilation and respiratory function stable ?Cardiovascular status: blood pressure returned to baseline and stable ?Postop Assessment: no apparent nausea or vomiting ?Anesthetic complications: no ? ? ?No notable events documented. ? ? ?Last Vitals:  ?Vitals:  ? 06/02/21 1244 06/02/21 1526  ?BP: 124/80 110/70  ?Pulse: 64   ?Resp: 18 17  ?Temp: 36.8 ?C 36.4 ?C  ?SpO2: 100% 99%  ?  ?Last Pain:  ?Vitals:  ? 06/02/21 1526  ?TempSrc: Oral  ?PainSc: 0-No pain  ? ? ?  ?  ?  ?  ?  ?  ? ?Zanylah Hardie C Haadiya Frogge ? ? ? ? ?

## 2021-06-02 NOTE — Op Note (Signed)
[facility] ?Patient Name: Jennifer [name] ?Procedure Date: 06/02/2021 1:02 PM ?MRN: [id number] ?Date of Birth: 1970-07-01 ?Attending MD: Norvel Richards , MD ?CSN: [id number] ?Age: 51 ?Admit Type: Outpatient ?Procedure:                Upper GI endoscopy ?Indications:              Dysphagia ?Providers:                Norvel Richards, MD, Lambert Mody,  ?                          Kristine L. Risa Grill, Technician ?Referring MD:              ?Medicines:                Propofol per Anesthesia ?Complications:            No immediate complications. ?Estimated Blood Loss:     Estimated blood loss: none. ?Procedure:                Pre-Anesthesia Assessment: ?                          - Prior to the procedure, a History and Physical  ?                          was performed, and patient medications and  ?                          allergies were reviewed. The patient's tolerance of  ?                          previous anesthesia was also reviewed. The risks  ?                          and benefits of the procedure and the sedation  ?                          options and risks were discussed with the patient.  ?                          All questions were answered, and informed consent  ?                          was obtained. Prior Anticoagulants: The patient has  ?                          taken no previous anticoagulant or antiplatelet  ?                          agents. ASA Grade Assessment: III - A patient with  ?                          severe systemic disease. After reviewing the risks  ?  and benefits, the patient was deemed in  ?                          satisfactory condition to undergo the procedure. ?                          After obtaining informed consent, the endoscope was  ?                          passed under direct vision. Throughout the  ?                          procedure, the patient's blood pressure, pulse, and  ?                          oxygen saturations  were monitored continuously. The  ?                          GIF-H190 ([id number]) scope was introduced through the  ?                          mouth, and advanced to the second part of duodenum.  ?                          The upper GI endoscopy was accomplished without  ?                          difficulty. The patient tolerated the procedure  ?                          well. ?Scope In: 3:09:42 PM ?Scope Out: 3:17:48 PM ?Total Procedure Duration: 0 hours 8 minutes 6 seconds  ?Findings: ?     The examined esophagus was normal except for a single 7 mm erosion  ?     straddling the GE junction. No tumor. No Barrett's epithelium. Tubular  ?     esophagus appeared patent throughout its course. The scope was  ?     withdrawn. Dilation was performed with a Maloney dilator with mild  ?     resistance at 56 Fr. The dilation site was examined following endoscope  ?     reinsertion and showed no change. Estimated blood loss: none. ?     A small hiatal hernia was present. Otherwise, normal-appearing gastric  ?     mucosa. Patent pylorus. ?     The duodenal bulb and second portion of the duodenum were normal. ?Impression:               - Mild erosive reflux esophagitis. Normal  ?                          esophagus. Dilated. ?                          - Small hiatal hernia. ?                          -  Normal duodenal bulb and second portion of the  ?                          duodenum. ?                          - No specimens collected. ?Moderate Sedation: ?     Moderate (conscious) sedation was personally administered by an  ?     anesthesia professional. The following parameters were monitored: oxygen  ?     saturation, heart rate, blood pressure, respiratory rate, EKG, adequacy  ?     of pulmonary ventilation, and response to care. ?Recommendation:           - Patient has a contact number available for  ?                          emergencies. The signs and symptoms of potential  ?                          delayed  complications were discussed with the  ?                          patient. Return to normal activities tomorrow.  ?                          Written discharge instructions were provided to the  ?                          patient. ?                          - Resume previous diet. ?                          - Continue present medications. Increase Protonix  ?                          to 40 mg twice daily. ?                          - Return to my office in 6 weeks. ?Procedure Code(s):        --- Professional --- ?                          [phone], Esophagogastroduodenoscopy, flexible,  ?                          transoral; diagnostic, including collection of  ?                          specimen(s) by brushing or washing, when performed  ?                          (separate procedure) ?                          43450, Dilation of esophagus, by  unguided sound or  ?                          bougie, single or multiple passes ?Diagnosis Code(s):        --- Professional --- ?                          K44.9, Diaphragmatic hernia without obstruction or  ?                          gangrene ?                          R13.10, Dysphagia, unspecified ?CPT copyright 2019 American Medical Association. All rights reserved. ?The codes documented in this report are preliminary and upon coder review may  ?be revised to meet current compliance requirements. ?Cristopher Estimable. Raniah Karan, MD ?Norvel Richards, MD ?06/02/2021 3:37:58 PM ?This report has been signed electronically. ?Number of Addenda: 0 ?

## 2021-06-02 NOTE — H&P (Signed)
$'@LOGO'C$ @ ? ? ?Primary Care Physician:  Olga Coaster, FNP ?Primary Gastroenterologist:  Dr. Gala Romney ? ?Pre-Procedure History & Physical: ?HPI:  Jennifer [name] is a 51 y.o. female here for  further evaluation generalized abdominal pain and solid liquid dysphagia/globus symptoms.  Prior EGD demonstrated normal-appearing esophagus.  She responded nicely to empiric passage of a Maloney dilator until recently.  She was seen through our office recently.  We recommended a CT scan and labs.  She did not get those done. ? ?Past Medical History:  ?Diagnosis Date  ? Allergy   ? Anemia   ? Anxiety   ? Arthritis   ? neck  ? Asthma   ? Chronic kidney disease, stage 2 (mild) 01/21/2020  ? Diabetes mellitus without complication ([place])   ? GERD (gastroesophageal reflux disease)   ? History of flexible sigmoidoscopy 1999  ? Normal to 60cm,  ? Hypertension   ? Neuromuscular disorder ([place])   ? Ovarian cancer ([place])   ? skin cancer  ? Panic attack   ? PTSD (post-traumatic stress disorder)   ? S/P endoscopy 03/2010  ? Nl, s/p 56-French Maloney dilator, biopsy benign  ? S/P endoscopy 2008  ? Mild gastritis  ? Seizures ([place])   ? Sleep apnea   ? Substance abuse ([place])   ? drug addiction  ? ? ?Past Surgical History:  ?Procedure Laterality Date  ? ABDOMINAL HYSTERECTOMY    ? ovarian tumors  ? CHOLECYSTECTOMY    ? COLONOSCOPY  03/01/2011  ? Whittney Steenson-External hemorrhoids/otherwise normal rectum and colon.  ? COLONOSCOPY WITH PROPOFOL N/A 08/08/2018  ? entire examined colon is normal. The distal rectum and anal verge are normal on retroflexion view. Repeat in 5 years.   ? ESOPHAGOGASTRODUODENOSCOPY  2008  ? gastritis  ? ESOPHAGOGASTRODUODENOSCOPY  2012  ? Moderate sized hiatal hernia, non-H. pylori gastritis  ? ESOPHAGOGASTRODUODENOSCOPY N/A 11/17/2013  ? RMR: Mild erosive reflux esophagitis. Patulous EG junction  ? ESOPHAGOGASTRODUODENOSCOPY (EGD) WITH PROPOFOL N/A 08/08/2018  ? Erosive reflux esophagitis. Dilated. Small hiatal hernia. Otherwise normal.   ?  HERNIA REPAIR    ? Incisional with mesh  ? KNEE ARTHROSCOPY    ? left knee surgery in [place]  ? MALONEY DILATION  08/08/2018  ? Procedure: MALONEY DILATION;  Surgeon: Daneil Dolin, MD;  Location: [facility];  Service: Endoscopy;;  ? PARTIAL HYSTERECTOMY    ? SKIN CANCER EXCISION    ? left bicep  ? TONSILLECTOMY    ? ? ?Prior to Admission medications   ?Medication Sig Start Date End Date Taking? Authorizing Provider  ?albuterol (PROVENTIL) (2.5 MG/3ML) 0.083% nebulizer solution Take 3 mLs (2.5 mg total) by nebulization every 6 (six) hours as needed for wheezing or shortness of breath. 07/30/20  Yes Martyn Ehrich, NP  ?albuterol (VENTOLIN HFA) 108 (90 Base) MCG/ACT inhaler Inhale 2 puffs into the lungs every 6 (six) hours as needed for wheezing or shortness of breath. 06/01/20  Yes Chesley Mires, MD  ?budesonide (PULMICORT) 0.5 MG/2ML nebulizer solution Take 2 mLs (0.5 mg total) by nebulization 2 (two) times daily. 10/19/20  Yes Chesley Mires, MD  ?cetirizine (ZYRTEC) 10 MG tablet Take 1 tablet (10 mg total) by mouth every morning. 05/21/18  Yes Terald Sleeper, PA-C  ?cyclobenzaprine (FLEXERIL) 10 MG tablet Take 10 mg by mouth at bedtime as needed for muscle spasms. 04/19/21  Yes [provider]  ?EPINEPHrine 0.15 MG/0.15ML IJ injection Inject 0.15 mg into the muscle as needed for anaphylaxis.  Yes [provider]  ?fluticasone furoate-vilanterol (BREO ELLIPTA) 100-25 MCG/ACT AEPB Inhale 1 puff into the lungs daily.   Yes [provider]  ?imipramine (TOFRANIL) 25 MG tablet Take 25 mg by mouth at bedtime.   Yes [provider]  ?lamoTRIgine (LAMICTAL) 200 MG tablet Take 200 mg by mouth daily. 03/22/20  Yes [provider]  ?levETIRAcetam (KEPPRA XR) 500 MG 24 hr tablet Take 2,000 mg by mouth daily. 05/13/21  Yes [provider]  ?montelukast (SINGULAIR) 10 MG tablet Take 1 tablet (10 mg total) by mouth at bedtime. 10/19/20  Yes Chesley Mires, MD  ?ondansetron  (ZOFRAN) 4 MG tablet Take 1 tablet (4 mg total) by mouth every 8 (eight) hours as needed for nausea or vomiting. 04/12/21  Yes Annitta Needs, NP  ?pantoprazole (PROTONIX) 40 MG tablet TAKE 1 TABLET BY MOUTH TWICE DAILY BEFORE MEALS. 03/18/21  Yes [name], PA-C  ?pregabalin (LYRICA) 100 MG capsule Take 100 mg by mouth 3 (three) times daily.   Yes [provider]  ?propranolol (INDERAL) 20 MG tablet Take 20 mg by mouth 2 (two) times daily. 05/13/21  Yes [provider]  ?QUEtiapine (SEROQUEL) 200 MG tablet Take 200 mg by mouth at bedtime. 04/11/21  Yes [provider]  ?sertraline (ZOLOFT) 50 MG tablet Take 50 mg by mouth daily.   Yes [provider]  ?traMADol (ULTRAM) 50 MG tablet Take 50 mg by mouth 3 (three) times daily as needed for moderate pain.   Yes [provider]  ?Vitamin D, Ergocalciferol, (DRISDOL) 1.25 MG (50000 UT) CAPS capsule Take 1 capsule (50,000 Units total) by mouth every 7 (seven) days. 05/21/18  Yes Terald Sleeper, PA-C  ?fluticasone (FLONASE) 50 MCG/ACT nasal spray Place 1 spray into both nostrils daily. ?Patient not taking: Reported on 05/27/2021 05/21/18   Terald Sleeper, PA-C  ?formoterol (PERFOROMIST) 20 MCG/2ML nebulizer solution Take 2 mLs (20 mcg total) by nebulization 2 (two) times daily. ?Patient not taking: Reported on 05/27/2021 06/30/20   Martyn Ehrich, NP  ? ? ?Allergies as of 04/14/2021 - Review Complete 03/09/2021  ?Allergen Reaction Noted  ? Metrizamide Rash and Swelling 10/17/2010  ? Shellfish allergy Swelling 10/29/2013  ? Amoxapine Nausea Only 10/01/2019  ? Amoxapine and related  07/12/2018  ? Amoxicillin Other (See Comments) 12/13/2020  ? Penicillin g Other (See Comments) 12/13/2020  ? Codeine Other (See Comments)   ? Hydrocodone Itching 12/03/2012  ? Ivp dye [iodinated contrast media] Rash 10/17/2010  ? Latex Rash 03/08/2010  ? ? ?Family History  ?Problem Relation Age of Onset  ? Crohn's disease Maternal Aunt   ? Breast cancer  Maternal Grandmother   ? Cancer Maternal Grandmother   ? Colon cancer Maternal Grandfather   ?     38  ? Pancreatic cancer Maternal Grandfather   ? Cancer Maternal Grandfather   ? Crohn's disease Cousin   ?     x 2 cousins  ? Breast cancer Daughter 40  ? Arthritis Mother   ? COPD Mother   ? Depression Mother   ? Drug abuse Mother   ? Heart disease Mother   ? Hypertension Mother   ? Hyperlipidemia Mother   ? Learning disabilities Mother   ? Mental illness Mother   ? Alcohol abuse Mother   ? Colon polyps Mother 57  ?     tubular adenoma  ? Early death Father 86  ?     murder  ? Alcohol  abuse Father   ? Drug abuse Father   ? Learning disabilities Father   ? Cancer Paternal Grandmother   ? Alcohol abuse Paternal Grandmother   ? Cancer Paternal Grandfather   ? Alcohol abuse Maternal Uncle   ? Alcohol abuse Paternal Uncle   ? Drug abuse Paternal Uncle   ? ? ?Social History  ? ?Socioeconomic History  ? Marital status: Married  ?  Spouse name: [name]  ? Number of children: Not on file  ? Years of education: Not on file  ? Highest education level: 11th grade  ?Occupational History  ? Occupation: disabled  ?  Employer: NOT EMPLOYED  ?Tobacco Use  ? Smoking status: Some Days  ?  Packs/day: 1.50  ?  Years: 26.00  ?  Pack years: 39.00  ?  Types: Cigarettes  ?  Start date: 05/03/1984  ?  Last attempt to quit: 12/27/2019  ?  Years since quitting: 1.4  ? Smokeless tobacco: Never  ? Tobacco comments:  ?  Patient states she is smoking based off her mood.   ?Vaping Use  ? Vaping Use: Never used  ?Substance and Sexual Activity  ? Alcohol use: Yes  ?  Comment: occassionally  ? Drug use: Yes  ?  Types: Marijuana  ? Sexual activity: Not Currently  ?  Birth control/protection: Surgical  ?Other Topics Concern  ? Not on file  ?Social History Narrative  ? Lives at home with her husband.  ? Right handed.   ? 1-2 cups of coffee 3 times per wk.   ? Intermittent soda.  ? ?Social Determinants of Health  ? ?Financial Resource Strain: Not on file   ?Food Insecurity: Not on file  ?Transportation Needs: Not on file  ?Physical Activity: Not on file  ?Stress: Not on file  ?Social Connections: Not on file  ?Intimate Partner Violence: Not on file  ? ? ?Rev

## 2021-06-02 NOTE — Discharge Instructions (Addendum)
EGD ?Discharge instructions ?Please read the instructions outlined below and refer to this sheet in the next few weeks. These discharge instructions provide you with general information on caring for yourself after you leave the hospital. Your doctor may also give you specific instructions. While your treatment has been planned according to the most current medical practices available, unavoidable complications occasionally occur. If you have any problems or questions after discharge, please call your doctor. ?ACTIVITY ?You may resume your regular activity but move at a slower pace for the next 24 hours.  ?Take frequent rest periods for the next 24 hours.  ?Walking will help expel (get rid of) the air and reduce the bloated feeling in your abdomen.  ?No driving for 24 hours (because of the anesthesia (medicine) used during the test).  ?You may shower.  ?Do not sign any important legal documents or operate any machinery for 24 hours (because of the anesthesia used during the test).  ?NUTRITION ?Drink plenty of fluids.  ?You may resume your normal diet.  ?Begin with a light meal and progress to your normal diet.  ?Avoid alcoholic beverages for 24 hours or as instructed by your caregiver.  ?MEDICATIONS ?You may resume your normal medications unless your caregiver tells you otherwise.  ?WHAT YOU CAN EXPECT TODAY ?You may experience abdominal discomfort such as a feeling of fullness or ?gas? pains.  ?FOLLOW-UP ?Your doctor will discuss the results of your test with you.  ?SEEK IMMEDIATE MEDICAL ATTENTION IF ANY OF THE FOLLOWING OCCUR: ?Excessive nausea (feeling sick to your stomach) and/or vomiting.  ?Severe abdominal pain and distention (swelling).  ?Trouble swallowing.  ?Temperature over 101? F (37.8? C).  ?Rectal bleeding or vomiting of blood.   ? ? ?- Your esophagus was stretched today ? ?-  we will increase her Protonix to 40 mg twice daily (before breakfast and supper) new prescription provided ? ?-   office visit  with Roseanne Kaufman in 6 weeks. Office will call you to schedule appointment.  ? ? at patient request I called we have more at [phone] -  reviewed findings and recommendations ? ? ?

## 2021-06-07 ENCOUNTER — Encounter (HOSPITAL_COMMUNITY): Payer: Self-pay | Admitting: Internal Medicine

## 2021-06-10 ENCOUNTER — Other Ambulatory Visit: Payer: Self-pay | Admitting: Pulmonary Disease

## 2021-06-29 ENCOUNTER — Ambulatory Visit (HOSPITAL_COMMUNITY): Admission: RE | Admit: 2021-06-29 | Payer: 59 | Source: Ambulatory Visit

## 2021-06-30 ENCOUNTER — Encounter: Payer: Self-pay | Admitting: Internal Medicine

## 2021-07-12 ENCOUNTER — Ambulatory Visit: Payer: Self-pay | Admitting: Pulmonary Disease

## 2021-07-12 ENCOUNTER — Telehealth: Payer: Self-pay | Admitting: Pulmonary Disease

## 2021-07-12 DIAGNOSIS — G4733 Obstructive sleep apnea (adult) (pediatric): Secondary | ICD-10-CM

## 2021-07-12 NOTE — Progress Notes (Deleted)
GI Office Note    Referring Provider: Olga Coaster, FNP Primary Care Physician:  Olga Coaster, FNP Primary GI: Dr. Gala Romney  Date:  07/12/2021  ID:  Jennifer [name], DOB 1970/08/25, MRN [id number]   Chief Complaint   No chief complaint on file.    History of Present Illness  Jennifer [name] is a 51 y.o. female with a history of constipation, GERD, and hemorrhoids s/p banding in 2019*** presenting today for post procedure follow up.   She has had normal GES in the past as well as prior EGD and colonoscopy July 2020.  EGD with erosive reflux esophagitis s/p dilation, small hiatal hernia, otherwise normal.  Colonoscopy normal repeat in 5 years.  She was last seen via video visit 04/12/2021 for abdominal pain.  She also reported intermittent nausea and short time of diarrhea.  Had reported about 2 bowel movements per day.  Was doing fiber but stopped it.  Was drinking coffee with limited which makes her go to the bathroom, Linzess was previously not covered by her insurance.  She was on pantoprazole twice daily.  Stated that she had lower abdominal soreness symptoms feeling she has to go to the bathroom but cannot go when she does go to large amount.  Reported cramping in the left side before bowel movement denied rectal bleeding.  She denies lack of appetite or early satiety.  Reported liquid dysphagia and globus sensation.  CT A/P ordered as well as labs.  She was given Zofran as needed for nausea is scheduled for EGD with dilation.  CT A/P has not been completed.  EGD 06/02/2021 with mild erosive reflux esophagitis, normal esophagus status post dilation, small hiatal hernia, normal duodenum.  Recommended to continue Protonix 40 mg twice daily.  Today:    Past Medical History:  Diagnosis Date   Allergy    Anemia    Anxiety    Arthritis    neck   Asthma    Chronic kidney disease, stage 2 (mild) 01/21/2020   Diabetes mellitus without complication (HCC)    GERD (gastroesophageal reflux  disease)    History of flexible sigmoidoscopy 1999   Normal to 60cm,   Hypertension    Neuromuscular disorder ([place])    Ovarian cancer ([place])    skin cancer   Panic attack    PTSD (post-traumatic stress disorder)    S/P endoscopy 03/2010   Nl, s/p 56-French Maloney dilator, biopsy benign   S/P endoscopy 2008   Mild gastritis   Seizures (HCC)    Sleep apnea    Substance abuse ([place])    drug addiction    Past Surgical History:  Procedure Laterality Date   ABDOMINAL HYSTERECTOMY     ovarian tumors   CHOLECYSTECTOMY     COLONOSCOPY  03/01/2011   [place]-External hemorrhoids/otherwise normal rectum and colon.   COLONOSCOPY WITH PROPOFOL N/A 08/08/2018   entire examined colon is normal. The distal rectum and anal verge are normal on retroflexion view. Repeat in 5 years.    ESOPHAGOGASTRODUODENOSCOPY  2008   gastritis   ESOPHAGOGASTRODUODENOSCOPY  2012   Moderate sized hiatal hernia, non-H. pylori gastritis   ESOPHAGOGASTRODUODENOSCOPY N/A 11/17/2013   RMR: Mild erosive reflux esophagitis. Patulous EG junction   ESOPHAGOGASTRODUODENOSCOPY (EGD) WITH PROPOFOL N/A 08/08/2018   Erosive reflux esophagitis. Dilated. Small hiatal hernia. Otherwise normal.    ESOPHAGOGASTRODUODENOSCOPY (EGD) WITH PROPOFOL N/A 06/02/2021   Procedure: ESOPHAGOGASTRODUODENOSCOPY (EGD) WITH PROPOFOL;  Surgeon: Daneil Dolin, MD;  [facility];  Service: Endoscopy;  Laterality: N/A;  2:00pm   HERNIA REPAIR     Incisional with mesh   KNEE ARTHROSCOPY     left knee surgery in [place]   MALONEY DILATION  08/08/2018   Procedure: MALONEY DILATION;  Surgeon: Daneil Dolin, MD;  Location: [facility];  Service: Endoscopy;;   MALONEY DILATION N/A 06/02/2021   Procedure: Keturah Shavers;  Surgeon: Daneil Dolin, MD;  Location: [facility];  Service: Endoscopy;  Laterality: N/A;   PARTIAL HYSTERECTOMY     SKIN CANCER EXCISION     left bicep   TONSILLECTOMY      Current Outpatient Medications   Medication Sig Dispense Refill   albuterol (PROVENTIL) (2.5 MG/3ML) 0.083% nebulizer solution Take 3 mLs (2.5 mg total) by nebulization every 6 (six) hours as needed for wheezing or shortness of breath. 180 mL 5   albuterol (VENTOLIN HFA) 108 (90 Base) MCG/ACT inhaler Inhale 2 puffs into the lungs every 6 (six) hours as needed for wheezing or shortness of breath. 8 g 5   budesonide (PULMICORT) 0.5 MG/2ML nebulizer solution Take 2 mLs (0.5 mg total) by nebulization 2 (two) times daily. 60 mL 12   cetirizine (ZYRTEC) 10 MG tablet Take 1 tablet (10 mg total) by mouth every morning. 90 tablet 3   cyclobenzaprine (FLEXERIL) 10 MG tablet Take 10 mg by mouth at bedtime as needed for muscle spasms.     EPINEPHrine 0.15 MG/0.15ML IJ injection Inject 0.15 mg into the muscle as needed for anaphylaxis.     fluticasone (FLONASE) 50 MCG/ACT nasal spray Place 1 spray into both nostrils daily. (Patient not taking: Reported on 05/27/2021) 16 g 11   fluticasone furoate-vilanterol (BREO ELLIPTA) 100-25 MCG/ACT AEPB Inhale 1 puff into the lungs daily.     formoterol (PERFOROMIST) 20 MCG/2ML nebulizer solution Take 2 mLs (20 mcg total) by nebulization 2 (two) times daily. (Patient not taking: Reported on 05/27/2021) 120 mL 11   imipramine (TOFRANIL) 25 MG tablet Take 25 mg by mouth at bedtime.     lamoTRIgine (LAMICTAL) 200 MG tablet Take 200 mg by mouth daily.     levETIRAcetam (KEPPRA XR) 500 MG 24 hr tablet Take 2,000 mg by mouth daily.     montelukast (SINGULAIR) 10 MG tablet TAKE (1) TABLET BY MOUTH DAILY AT BEDTIME. 28 tablet 11   ondansetron (ZOFRAN) 4 MG tablet Take 1 tablet (4 mg total) by mouth every 8 (eight) hours as needed for nausea or vomiting. 60 tablet 3   pantoprazole (PROTONIX) 40 MG tablet TAKE 1 TABLET BY MOUTH TWICE DAILY BEFORE MEALS. 60 tablet 3   pregabalin (LYRICA) 100 MG capsule Take 100 mg by mouth 3 (three) times daily.     propranolol (INDERAL) 20 MG tablet Take 20 mg by mouth 2 (two) times  daily.     QUEtiapine (SEROQUEL) 200 MG tablet Take 200 mg by mouth at bedtime.     sertraline (ZOLOFT) 50 MG tablet Take 50 mg by mouth daily.     traMADol (ULTRAM) 50 MG tablet Take 50 mg by mouth 3 (three) times daily as needed for moderate pain.     Vitamin D, Ergocalciferol, (DRISDOL) 1.25 MG (50000 UT) CAPS capsule Take 1 capsule (50,000 Units total) by mouth every 7 (seven) days. 5 capsule 11   No current facility-administered medications for this visit.    Allergies as of 07/14/2021 - Review Complete 06/02/2021  Allergen Reaction Noted   Metrizamide Rash and Swelling 10/17/2010  Shellfish allergy Swelling 10/29/2013   Amoxapine Nausea Only 10/01/2019   Amoxicillin Other (See Comments) 12/13/2020   Penicillin g Other (See Comments) 12/13/2020   Codeine Itching    Hydrocodone Itching and Rash 12/03/2012   Ivp dye [iodinated contrast media] Rash 10/17/2010   Latex Rash 03/08/2010    Family History  Problem Relation Age of Onset   Crohn's disease Maternal Aunt    Breast cancer Maternal Grandmother    Cancer Maternal Grandmother    Colon cancer Maternal Grandfather        78   Pancreatic cancer Maternal Grandfather    Cancer Maternal Grandfather    Crohn's disease Cousin        x 2 cousins   Breast cancer Daughter 22   Arthritis Mother    COPD Mother    Depression Mother    Drug abuse Mother    Heart disease Mother    Hypertension Mother    Hyperlipidemia Mother    Learning disabilities Mother    Mental illness Mother    Alcohol abuse Mother    Colon polyps Mother 10       tubular adenoma   Early death Father 28       murder   Alcohol abuse Father    Drug abuse Father    Learning disabilities Father    Cancer Paternal Grandmother    Alcohol abuse Paternal Grandmother    Cancer Paternal Grandfather    Alcohol abuse Maternal Uncle    Alcohol abuse Paternal Uncle    Drug abuse Paternal Uncle     Social History   Socioeconomic History   Marital status:  Married    Spouse name: [name]   Number of children: Not on file   Years of education: Not on file   Highest education level: 11th grade  Occupational History   Occupation: disabled    Fish farm manager: NOT EMPLOYED  Tobacco Use   Smoking status: Some Days    Packs/day: 1.50    Years: 26.00    Pack years: 39.00    Types: Cigarettes    Start date: 05/03/1984    Last attempt to quit: 12/27/2019    Years since quitting: 1.5   Smokeless tobacco: Never   Tobacco comments:    Patient states she is smoking based off her mood.   Vaping Use   Vaping Use: Never used  Substance and Sexual Activity   Alcohol use: Yes    Comment: occassionally   Drug use: Yes    Types: Marijuana   Sexual activity: Not Currently    Birth control/protection: Surgical  Other Topics Concern   Not on file  Social History Narrative   Lives at home with her husband.   Right handed.    1-2 cups of coffee 3 times per wk.    Intermittent soda.   Social Determinants of Health   Financial Resource Strain: Not on file  Food Insecurity: Not on file  Transportation Needs: Not on file  Physical Activity: Not on file  Stress: Not on file  Social Connections: Not on file     Review of Systems   Gen: Denies fever, chills, anorexia. Denies fatigue, weakness, weight loss.  CV: Denies chest pain, palpitations, syncope, peripheral edema, and claudication. Resp: Denies dyspnea at rest, cough, wheezing, coughing up blood, and pleurisy. GI: Denies vomiting blood, jaundice, and fecal incontinence.   Denies dysphagia or odynophagia. Derm: Denies rash, itching, dry skin Psych: Denies depression, anxiety, memory loss, confusion. No  homicidal or suicidal ideation.  Heme: Denies bruising, bleeding, and enlarged lymph nodes.   Physical Exam   There were no vitals taken for this visit.  General:   Alert and oriented. No distress noted. Pleasant and cooperative.  Head:  Normocephalic and atraumatic. Eyes:  Conjuctiva clear  without scleral icterus. Mouth:  Oral mucosa pink and moist. Good dentition. No lesions. Lungs:  Clear to auscultation bilaterally. No wheezes, rales, or rhonchi. No distress.  Heart:  S1, S2 present without murmurs appreciated.  Abdomen:  +BS, soft, non-tender and non-distended. No rebound or guarding. No HSM or masses noted. Rectal: *** Msk:  Symmetrical without gross deformities. Normal posture. Extremities:  Without edema. Neurologic:  Alert and  oriented x4 Psych:  Alert and cooperative. Normal mood and affect.   Assessment  Jennifer [name] is a 51 y.o. female with a history of anxiety, asthma, anemia, CKD stage II, diabetes, GERD, HTN, substance abuse*** presenting today with ***  Abdominal pain:   GERD: She has an EGD with mild erosive reflux esophagitis s/p empiric dilation.  Has been on pantoprazole 40 mg twice daily.   PLAN   ***     Venetia Night, MSN, FNP-BC, AGACNP-BC [facility]

## 2021-07-12 NOTE — Telephone Encounter (Signed)
Called and spoke to patient and advised her to follow up with [facility] in regards to the mask since it was confirmed that our [facility] sent the order over.   Patient also states that now her insurance wont cover the machine since she has not been seen within the 90 days of starting (patient cancelled appts). She states she did not come to appts because she did not have a new mask and has been unable to use CPAP and be compliant. She wants to know if she gets new mask if insurance will extend compliance period and pay for machine.   Will call insurance company when able and follow up.

## 2021-07-14 ENCOUNTER — Ambulatory Visit: Payer: 59 | Admitting: Gastroenterology

## 2021-07-14 ENCOUNTER — Ambulatory Visit (HOSPITAL_COMMUNITY): Payer: Medicaid Other

## 2021-07-18 NOTE — Telephone Encounter (Signed)
Patient states she spoke with her insurance company and they state she just needs a new order sent and they will continue to cover the machine/ supplies, due to patient having a faulty mask.  Please advise on next steps.

## 2021-07-18 NOTE — Telephone Encounter (Signed)
Patient called back and states [facility] wants her to turn in her machine because she has not used the machine within 90 days. Patient states she has had a lot of medical issues going on. Patient states that she cannot do self pay due to not having a job.   Please advise. Call back number is [phone]

## 2021-07-18 NOTE — Telephone Encounter (Signed)
Stacy from [place] states patient CPAP machine needs new order. States picking up patient's CPAP machine. Patient needs appointment and sleep study. Stacy phone number is [phone].

## 2021-07-18 NOTE — Telephone Encounter (Signed)
Called and spoke with [facility] to see what their status is and what needs to be done in order for patient to keep CPAP machine. I also asked them if they could tell me if a mask fitting was ever done since we ordered one in April. I was told they don't see where one was ever done on patient. Advised them that patient called insurance and they told her that they will continue to pay for machine and that new order just needs to be sent over to DME and them. She expressed understanding. New order has been placed and also placed order for mask fitting for patient since there other one was never done. Called and spoke with patient to give her the above information and she expressed understanding. Told her she would get a call to schedule mask fitting and if there were any other issues to call and let us know. Nothing further needed at this time.

## 2021-07-21 ENCOUNTER — Telehealth: Payer: Self-pay | Admitting: Pulmonary Disease

## 2021-07-22 NOTE — Telephone Encounter (Signed)
Please see message from [facility]  Pt called and states [facility] is wanting cpap back.  I called [facility] and Magda Paganini states pt will need inlab titration study due to non-compliance. They are going to need pt to return cpap.  Sending to triage for titration study order.

## 2021-07-25 NOTE — Telephone Encounter (Signed)
Attempted to call pt but unable to reach. Left message for her to return call. 

## 2021-07-25 NOTE — Telephone Encounter (Signed)
Please arrange for office visit to discuss treatment options for sleep apnea.

## 2021-07-25 NOTE — Telephone Encounter (Signed)
Patient is returning phone call. Patient phone number is [phone].

## 2021-07-25 NOTE — Telephone Encounter (Signed)
Spoke to patient and scheduled appt for 08/22/2021 at 3:00. She stated that she has returned cpap machine to [facility]. Nothing further needed.

## 2021-08-22 ENCOUNTER — Ambulatory Visit: Payer: 59 | Admitting: Orthopedic Surgery

## 2021-08-22 ENCOUNTER — Ambulatory Visit: Payer: 59 | Admitting: Pulmonary Disease

## 2021-09-06 ENCOUNTER — Other Ambulatory Visit (HOSPITAL_BASED_OUTPATIENT_CLINIC_OR_DEPARTMENT_OTHER): Payer: 59 | Admitting: Pulmonary Disease

## 2021-09-06 ENCOUNTER — Telehealth: Payer: Self-pay | Admitting: Internal Medicine

## 2021-09-06 NOTE — Telephone Encounter (Signed)
I have noted the change in her chart.

## 2021-09-06 NOTE — Telephone Encounter (Signed)
Pt is aware of her FU OV with RMR and wanted to let us know she now uses [facility] in [place] as her pharmacy.

## 2021-09-12 ENCOUNTER — Encounter: Payer: Self-pay | Admitting: Pulmonary Disease

## 2021-09-12 ENCOUNTER — Ambulatory Visit: Payer: 59 | Admitting: Pulmonary Disease

## 2021-09-12 VITALS — BP 134/68 | HR 82 | Temp 98.4°F | Ht 66.0 in | Wt 240.0 lb

## 2021-09-12 DIAGNOSIS — M25562 Pain in left knee: Secondary | ICD-10-CM | POA: Diagnosis not present

## 2021-09-12 DIAGNOSIS — E1142 Type 2 diabetes mellitus with diabetic polyneuropathy: Secondary | ICD-10-CM | POA: Diagnosis not present

## 2021-09-12 DIAGNOSIS — G44221 Chronic tension-type headache, intractable: Secondary | ICD-10-CM | POA: Diagnosis not present

## 2021-09-12 DIAGNOSIS — Z79899 Other long term (current) drug therapy: Secondary | ICD-10-CM | POA: Diagnosis not present

## 2021-09-12 DIAGNOSIS — M542 Cervicalgia: Secondary | ICD-10-CM | POA: Diagnosis not present

## 2021-09-12 DIAGNOSIS — Z789 Other specified health status: Secondary | ICD-10-CM | POA: Diagnosis not present

## 2021-09-12 DIAGNOSIS — M79606 Pain in leg, unspecified: Secondary | ICD-10-CM | POA: Diagnosis not present

## 2021-09-12 DIAGNOSIS — E668 Other obesity: Secondary | ICD-10-CM | POA: Diagnosis not present

## 2021-09-12 DIAGNOSIS — G4733 Obstructive sleep apnea (adult) (pediatric): Secondary | ICD-10-CM | POA: Diagnosis not present

## 2021-09-12 DIAGNOSIS — M62838 Other muscle spasm: Secondary | ICD-10-CM | POA: Diagnosis not present

## 2021-09-12 DIAGNOSIS — G40409 Other generalized epilepsy and epileptic syndromes, not intractable, without status epilepticus: Secondary | ICD-10-CM | POA: Diagnosis not present

## 2021-09-12 MED ORDER — IPRATROPIUM BROMIDE 0.03 % NA SOLN: 2.0000 | Freq: Three times a day (TID) | NASAL | 12 refills | Status: DC | PRN

## 2021-09-12 NOTE — Progress Notes (Signed)
Pulmonary, Critical Care, and Sleep Medicine  Chief Complaint  Patient presents with   Follow-up    Returned cpap machine     Constitutional:  BP 134/68 (BP Location: Left Arm, Patient Position: Sitting)   Pulse 82   Temp 98.4 F (36.9 C) (Temporal)   Ht '5\' 6"'$  (1.676 m)   Wt 240 lb (108.9 kg)   SpO2 97% Comment: ra  BMI 38.74 kg/m   Past Medical History:  HTN, CKD, DM 2, GERD, Ovarian cancer, PSTD, Seizurses, Substance abuse, Anxiety, Allergies, OA  Past Surgical History:  She  has a past surgical history that includes Partial hysterectomy; Cholecystectomy; Tonsillectomy; Hernia repair; Colonoscopy (03/01/2011); Esophagogastroduodenoscopy (2008); Esophagogastroduodenoscopy (2012); Esophagogastroduodenoscopy (N/A, 11/17/2013); Skin cancer excision; Abdominal hysterectomy; Colonoscopy with propofol (N/A, 08/08/2018); Esophagogastroduodenoscopy (egd) with propofol (N/A, 08/08/2018); maloney dilation (08/08/2018); Knee arthroscopy; Esophagogastroduodenoscopy (egd) with propofol (N/A, 06/02/2021); and maloney dilation (N/A, 06/02/2021).  Brief Summary:  Jennifer [name] is a 51 y.o. female former smoker with asthma, and obstructive sleep apnea.      Subjective:   She wasn't able to get adjusted to wearing CPAP.  She has trouble with mask fit and pressure setting.  She turned her machine back in.  Her husband says she snores and her breathing stops still while asleep.  She has persistent runny nose and drainage.  Happens more when she bends over.  She feels like her breathing catches when she takes a deep breath.  She hasn't been using her nebulizer medicine recently, but will be getting this renewed.  Physical Exam:   Appearance - well kempt   ENMT - no sinus tenderness, no oral exudate, no LAN, Mallampati 3 airway, no stridor  Respiratory - equal breath sounds bilaterally, no wheezing or rales  CV - s1s2 regular rate and rhythm, no murmurs  Ext - no clubbing, no  edema  Skin - no rashes  Psych - normal [name] and affect     Pulmonary testing:  PFT 06/29/20 >> FEV1 2.52 (99%), FEV1% 85, TLC 4.87 (90%), DLCO 95%, +BD  Sleep Tests:  PSG 04/29/13 >> AHI 0.4, SpO2 low 89% HST 09/02/20 >> AHI 7.8, SpO2 low 85%  Social History:  She  reports that she has been smoking cigarettes. She started smoking about 37 years ago. She has a 39.00 pack-year smoking history. She has never used smokeless tobacco. She reports current alcohol use. She reports current drug use. Drug: Marijuana.  Family History:  Her family history includes Alcohol abuse in her father, maternal uncle, mother, paternal grandmother, and paternal uncle; Arthritis in her mother; Breast cancer in her maternal grandmother; Breast cancer (age of onset: 65) in her daughter; COPD in her mother; Cancer in her maternal grandfather, maternal grandmother, paternal grandfather, and paternal grandmother; Colon cancer in her maternal grandfather; Colon polyps (age of onset: 32) in her mother; Crohn's disease in her cousin and maternal aunt; Depression in her mother; Drug abuse in her father, mother, and paternal uncle; Early death (age of onset: 18) in her father; Heart disease in her mother; Hyperlipidemia in her mother; Hypertension in her mother; Learning disabilities in her father and mother; Mental illness in her mother; Pancreatic cancer in her maternal grandfather.     Assessment/Plan:   Allergic asthma. - had difficulty with DPI and HFA inhalers - continue budesonide, perforomist, singulair - prn albuterol - if her breathing symptoms persist after restarting her nebulizer treatments, then she might need additional testing  Allergic rhinitis with post nasal drip. -  will add prn atrovent nasal spray - continue zyrtec, flonase, singulair  Obstructive sleep apnea. - intolerant of CPAP - will arrange for referral to Jennifer [name] to assess whether she is a candidate for an oral  appliance  Obesity. - discussed how weight can impact sleep and risk for sleep disordered breathing - discussed options to assist with weight loss: combination of diet modification, cardiovascular and strength training exercises   Time Spent Involved in Patient Care on Day of Examination:  36 minutes  Follow up:   Patient Instructions  Restart using your nebulizer medicines  Atrovent nasal spray 1 spray in each nostril three times per day as needed for runny nose  Will arrange for referral to Jennifer [name] to assess for an oral appliance to treat obstructive sleep apnea  Follow up in 4 months  Medication List:   Allergies as of 09/12/2021       Reactions   Metrizamide Rash, Swelling   Hands swelling/rash   Shellfish Allergy Swelling   Swells tongue   Amoxapine Nausea Only   Amoxicillin Other (See Comments)   WHAT IS THE REACTION?   Penicillin G Other (See Comments)   WHAT IS THE REACTION?   Codeine Itching   Hydrocodone Itching, Rash   Ivp Dye [iodinated Contrast Media] Rash   Hands swelling/rash   Latex Rash        Medication List        Accurate as of September 12, 2021 10:15 AM. If you have any questions, ask your nurse or doctor.          STOP taking these medications    fluticasone furoate-vilanterol 100-25 MCG/ACT Aepb Commonly known as: BREO ELLIPTA Stopped by: Chesley Mires, MD       TAKE these medications    albuterol 108 (90 Base) MCG/ACT inhaler Commonly known as: VENTOLIN HFA Inhale 2 puffs into the lungs every 6 (six) hours as needed for wheezing or shortness of breath.   albuterol (2.5 MG/3ML) 0.083% nebulizer solution Commonly known as: PROVENTIL Take 3 mLs (2.5 mg total) by nebulization every 6 (six) hours as needed for wheezing or shortness of breath.   budesonide 0.5 MG/2ML nebulizer solution Commonly known as: Pulmicort Take 2 mLs (0.5 mg total) by nebulization 2 (two) times daily.   cetirizine 10 MG tablet Commonly known  as: ZYRTEC Take 1 tablet (10 mg total) by mouth every morning.   cyclobenzaprine 10 MG tablet Commonly known as: FLEXERIL Take 10 mg by mouth at bedtime as needed for muscle spasms.   EPINEPHrine 0.15 MG/0.15ML injection Commonly known as: ADRENACLICK Inject 8.11 mg into the muscle as needed for anaphylaxis.   fluticasone 50 MCG/ACT nasal spray Commonly known as: FLONASE Place 1 spray into both nostrils daily.   formoterol 20 MCG/2ML nebulizer solution Commonly known as: Perforomist Take 2 mLs (20 mcg total) by nebulization 2 (two) times daily.   imipramine 25 MG tablet Commonly known as: TOFRANIL Take 25 mg by mouth at bedtime.   ipratropium 0.03 % nasal spray Commonly known as: ATROVENT Place 2 sprays into both nostrils 3 (three) times daily as needed for rhinitis. Started by: Chesley Mires, MD   lamoTRIgine 200 MG tablet Commonly known as: LAMICTAL Take 200 mg by mouth daily.   levETIRAcetam 500 MG 24 hr tablet Commonly known as: KEPPRA XR Take 2,000 mg by mouth daily.   montelukast 10 MG tablet Commonly known as: SINGULAIR TAKE (1) TABLET BY MOUTH DAILY AT BEDTIME.  ondansetron 4 MG tablet Commonly known as: ZOFRAN Take 1 tablet (4 mg total) by mouth every 8 (eight) hours as needed for nausea or vomiting.   pantoprazole 40 MG tablet Commonly known as: PROTONIX TAKE 1 TABLET BY MOUTH TWICE DAILY BEFORE MEALS.   pregabalin 100 MG capsule Commonly known as: LYRICA Take 100 mg by mouth 3 (three) times daily.   propranolol 20 MG tablet Commonly known as: INDERAL Take 20 mg by mouth 2 (two) times daily.   QUEtiapine 200 MG tablet Commonly known as: SEROQUEL Take 200 mg by mouth at bedtime.   sertraline 50 MG tablet Commonly known as: ZOLOFT Take 50 mg by mouth daily.   traMADol 50 MG tablet Commonly known as: ULTRAM Take 50 mg by mouth 3 (three) times daily as needed for moderate pain.   Vitamin D (Ergocalciferol) 1.25 MG (50000 UNIT) Caps  capsule Commonly known as: DRISDOL Take 1 capsule (50,000 Units total) by mouth every 7 (seven) days.        Signature:  Chesley Mires, MD [place] Pager - [phone] 09/12/2021, 10:15 AM

## 2021-09-12 NOTE — Patient Instructions (Signed)
Restart using your nebulizer medicines  Atrovent nasal spray 1 spray in each nostril three times per day as needed for runny nose  Will arrange for referral to Dr. Augustina Mood to assess for an oral appliance to treat obstructive sleep apnea  Follow up in 4 months

## 2021-09-27 DIAGNOSIS — R69 Illness, unspecified: Secondary | ICD-10-CM | POA: Diagnosis not present

## 2021-09-27 DIAGNOSIS — E785 Hyperlipidemia, unspecified: Secondary | ICD-10-CM | POA: Diagnosis not present

## 2021-09-27 DIAGNOSIS — Z713 Dietary counseling and surveillance: Secondary | ICD-10-CM | POA: Diagnosis not present

## 2021-09-27 DIAGNOSIS — Z6838 Body mass index (BMI) 38.0-38.9, adult: Secondary | ICD-10-CM | POA: Diagnosis not present

## 2021-09-27 DIAGNOSIS — R42 Dizziness and giddiness: Secondary | ICD-10-CM | POA: Diagnosis not present

## 2021-09-27 DIAGNOSIS — Z7182 Exercise counseling: Secondary | ICD-10-CM | POA: Diagnosis not present

## 2021-09-27 DIAGNOSIS — M549 Dorsalgia, unspecified: Secondary | ICD-10-CM | POA: Diagnosis not present

## 2021-09-27 DIAGNOSIS — I10 Essential (primary) hypertension: Secondary | ICD-10-CM | POA: Diagnosis not present

## 2021-09-27 DIAGNOSIS — E559 Vitamin D deficiency, unspecified: Secondary | ICD-10-CM | POA: Diagnosis not present

## 2021-09-27 DIAGNOSIS — E119 Type 2 diabetes mellitus without complications: Secondary | ICD-10-CM | POA: Diagnosis not present

## 2021-10-04 ENCOUNTER — Encounter: Payer: Self-pay | Admitting: Internal Medicine

## 2021-10-04 ENCOUNTER — Ambulatory Visit (INDEPENDENT_AMBULATORY_CARE_PROVIDER_SITE_OTHER): Payer: 59 | Admitting: Internal Medicine

## 2021-10-04 VITALS — BP 117/63 | HR 82 | Temp 97.7°F | Ht 66.0 in | Wt 244.8 lb

## 2021-10-04 DIAGNOSIS — K5900 Constipation, unspecified: Secondary | ICD-10-CM

## 2021-10-04 DIAGNOSIS — R1310 Dysphagia, unspecified: Secondary | ICD-10-CM

## 2021-10-04 NOTE — Patient Instructions (Signed)
It was good to see you again today!  Lets revisit Linzess.  It was working for you very well.  Linzess 145 daily.  Samples provided.  We may have to go to another agent that she can afford depending on insurance coverage.  Repeat colonoscopy 2025.  Decrease Protonix or pantoprazole to 40 mg each morning 30 minutes before breakfast.  Office visit with [place] in 2 months.

## 2021-10-04 NOTE — Progress Notes (Signed)
Primary Care Physician:  Olga Coaster, FNP Primary Gastroenterologist:  Dr. Gala Romney  Pre-Procedure History & Physical: HPI:  Jennifer [name] is a 51 y.o. female here for further evaluation of bloating recurrent constipation.  Was doing well as Linzess 145.  Insurance did not provide a good benefit.  Understand it today we will pay for Trulance but this has not been confirmed.  May go days without a bowel movement no more than 2 weekly.  Feels uncomfortable.  No bleeding.  Colonoscopy normal 2020.  Reflux well controlled on pantoprazole 40 mg twice daily.  No dysphagia after having her esophagus dilated.  Past Medical History:  Diagnosis Date   Allergy    Anemia    Anxiety    Arthritis    neck   Asthma    Chronic kidney disease, stage 2 (mild) 01/21/2020   Diabetes mellitus without complication (HCC)    GERD (gastroesophageal reflux disease)    History of flexible sigmoidoscopy 1999   Normal to 60cm,   Hypertension    Neuromuscular disorder ([place])    Ovarian cancer ([place])    skin cancer   Panic attack    PTSD (post-traumatic stress disorder)    S/P endoscopy 03/2010   Nl, s/p 56-French Maloney dilator, biopsy benign   S/P endoscopy 2008   Mild gastritis   Seizures (HCC)    Sleep apnea    Substance abuse ([place])    drug addiction    Past Surgical History:  Procedure Laterality Date   ABDOMINAL HYSTERECTOMY     ovarian tumors   CHOLECYSTECTOMY     COLONOSCOPY  03/01/2011   Ulices Maack-External hemorrhoids/otherwise normal rectum and colon.   COLONOSCOPY WITH PROPOFOL N/A 08/08/2018   entire examined colon is normal. The distal rectum and anal verge are normal on retroflexion view. Repeat in 5 years.    ESOPHAGOGASTRODUODENOSCOPY  2008   gastritis   ESOPHAGOGASTRODUODENOSCOPY  2012   Moderate sized hiatal hernia, non-H. pylori gastritis   ESOPHAGOGASTRODUODENOSCOPY N/A 11/17/2013   RMR: Mild erosive reflux esophagitis. Patulous EG junction   ESOPHAGOGASTRODUODENOSCOPY (EGD) WITH  PROPOFOL N/A 08/08/2018   Erosive reflux esophagitis. Dilated. Small hiatal hernia. Otherwise normal.    ESOPHAGOGASTRODUODENOSCOPY (EGD) WITH PROPOFOL N/A 06/02/2021   Procedure: ESOPHAGOGASTRODUODENOSCOPY (EGD) WITH PROPOFOL;  Surgeon: Daneil Dolin, MD;  [facility];  Service: Endoscopy;  Laterality: N/A;  2:00pm   HERNIA REPAIR     Incisional with mesh   KNEE ARTHROSCOPY     left knee surgery in [place]   MALONEY DILATION  08/08/2018   Procedure: MALONEY DILATION;  Surgeon: Daneil Dolin, MD;  [facility];  Service: Endoscopy;;   MALONEY DILATION N/A 06/02/2021   Procedure: Keturah Shavers;  Surgeon: Daneil Dolin, MD;  [facility];  Service: Endoscopy;  Laterality: N/A;   PARTIAL HYSTERECTOMY     SKIN CANCER EXCISION     left bicep   TONSILLECTOMY      Prior to Admission medications   Medication Sig Start Date End Date Taking? Authorizing Provider  albuterol (PROVENTIL) (2.5 MG/3ML) 0.083% nebulizer solution Take 3 mLs (2.5 mg total) by nebulization every 6 (six) hours as needed for wheezing or shortness of breath. 07/30/20  Yes Martyn Ehrich, NP  albuterol (VENTOLIN HFA) 108 (90 Base) MCG/ACT inhaler Inhale 2 puffs into the lungs every 6 (six) hours as needed for wheezing or shortness of breath. 06/01/20  Yes Chesley Mires, MD  cetirizine (ZYRTEC) 10 MG  tablet Take 1 tablet (10 mg total) by mouth every morning. 05/21/18  Yes Terald Sleeper, PA-C  cyclobenzaprine (FLEXERIL) 10 MG tablet Take 10 mg by mouth at bedtime as needed for muscle spasms. 04/19/21  Yes [provider]  EPINEPHrine 0.15 MG/0.15ML IJ injection Inject 0.15 mg into the muscle as needed for anaphylaxis.   Yes [provider]  escitalopram (LEXAPRO) 5 MG tablet Take 5 mg by mouth daily. 08/23/21  Yes [provider]  fluticasone (FLONASE) 50 MCG/ACT nasal spray Place 1 spray into both nostrils daily. 05/21/18  Yes Terald Sleeper, PA-C  gabapentin  (NEURONTIN) 400 MG capsule Take 400 mg by mouth daily.   Yes [provider]  ipratropium (ATROVENT) 0.03 % nasal spray Place 2 sprays into both nostrils 3 (three) times daily as needed for rhinitis. 09/12/21  Yes Chesley Mires, MD  lamoTRIgine (LAMICTAL) 200 MG tablet Take 200 mg by mouth daily. 03/22/20  Yes [provider]  levETIRAcetam (KEPPRA XR) 500 MG 24 hr tablet Take 2,000 mg by mouth daily. 05/13/21  Yes [provider]  montelukast (SINGULAIR) 10 MG tablet TAKE (1) TABLET BY MOUTH DAILY AT BEDTIME. 06/10/21  Yes Chesley Mires, MD  ondansetron (ZOFRAN) 4 MG tablet Take 1 tablet (4 mg total) by mouth every 8 (eight) hours as needed for nausea or vomiting. 04/12/21  Yes Annitta Needs, NP  pantoprazole (PROTONIX) 40 MG tablet TAKE 1 TABLET BY MOUTH TWICE DAILY BEFORE MEALS. 03/18/21  Yes [name], PA-C  propranolol (INDERAL) 20 MG tablet Take 20 mg by mouth 2 (two) times daily. 05/13/21  Yes [provider]  QUEtiapine (SEROQUEL) 200 MG tablet Take 200 mg by mouth at bedtime. 04/11/21  Yes [provider]  traMADol (ULTRAM) 50 MG tablet Take 50 mg by mouth 3 (three) times daily as needed for moderate pain.   Yes [provider]  Vitamin D, Ergocalciferol, (DRISDOL) 1.25 MG (50000 UT) CAPS capsule Take 1 capsule (50,000 Units total) by mouth every 7 (seven) days. 05/21/18  Yes Terald Sleeper, PA-C    Allergies as of 10/04/2021 - Review Complete 10/04/2021  Allergen Reaction Noted   Metrizamide Rash and Swelling 10/17/2010   Shellfish allergy Swelling 10/29/2013   Amoxapine Nausea Only 10/01/2019   Amoxicillin Other (See Comments) 12/13/2020   Penicillin g Other (See Comments) 12/13/2020   Codeine Itching    Hydrocodone Itching and Rash 12/03/2012   Ivp dye [iodinated contrast media] Rash 10/17/2010   Latex Rash 03/08/2010    Family History  Problem Relation Age of Onset   Crohn's disease Maternal Aunt    Breast cancer Maternal  Grandmother    Cancer Maternal Grandmother    Colon cancer Maternal Grandfather        78   Pancreatic cancer Maternal Grandfather    Cancer Maternal Grandfather    Crohn's disease Cousin        x 2 cousins   Breast cancer Daughter 82   Arthritis Mother    COPD Mother    Depression Mother    Drug abuse Mother    Heart disease Mother    Hypertension Mother    Hyperlipidemia Mother    Learning disabilities Mother    Mental illness Mother    Alcohol abuse Mother    Colon polyps Mother 34       tubular adenoma   Early death Father 51       murder   Alcohol abuse Father  Drug abuse Father    Learning disabilities Father    Cancer Paternal Grandmother    Alcohol abuse Paternal Grandmother    Cancer Paternal Grandfather    Alcohol abuse Maternal Uncle    Alcohol abuse Paternal Uncle    Drug abuse Paternal Uncle     Social History   Socioeconomic History   Marital status: Married    Spouse name: [name]   Number of children: Not on file   Years of education: Not on file   Highest education level: 11th grade  Occupational History   Occupation: disabled    Fish farm manager: NOT EMPLOYED  Tobacco Use   Smoking status: Every Day    Packs/day: 0.25    Years: 26.00    Total pack years: 6.50    Types: Cigarettes    Start date: 05/03/1984    Last attempt to quit: 12/27/2019    Years since quitting: 1.7   Smokeless tobacco: Never   Tobacco comments:    Patient states she is smoking based off her mood.   Vaping Use   Vaping Use: Never used  Substance and Sexual Activity   Alcohol use: Not Currently    Comment: occassionally   Drug use: Not Currently    Types: Marijuana   Sexual activity: Not Currently    Birth control/protection: Surgical  Other Topics Concern   Not on file  Social History Narrative   Lives at home with her husband.   Right handed.    1-2 cups of coffee 3 times per wk.    Intermittent soda.   Social Determinants of Health   Financial Resource  Strain: Low Risk  (05/28/2019)   Overall Financial Resource Strain (CARDIA)    Difficulty of Paying Living Expenses: Not hard at all  Food Insecurity: No Food Insecurity (05/28/2019)   Hunger Vital Sign    Worried About Running Out of Food in the Last Year: Never true    Ran Out of Food in the Last Year: Never true  Transportation Needs: No Transportation Needs (05/28/2019)   PRAPARE - Hydrologist (Medical): No    Lack of Transportation (Non-Medical): No  Physical Activity: Inactive (05/28/2019)   Exercise Vital Sign    Days of Exercise per Week: 0 days    Minutes of Exercise per Session: 0 min  Stress: Stress Concern Present (05/28/2019)   [place]    Feeling of Stress : To some extent  Social Connections: Moderately Integrated (05/28/2019)   Social Connection and Isolation Panel [NHANES]    Frequency of Communication with Friends and Family: More than three times a week    Frequency of Social Gatherings with Friends and Family: More than three times a week    Attends Religious Services: 1 to 4 times per year    Active Member of [employer] or Organizations: No    Attends Archivist Meetings: Never    Marital Status: Married  Human resources officer Violence: Not At Risk (05/28/2019)   Humiliation, Afraid, Rape, and Kick questionnaire    Fear of Current or Ex-Partner: No    Emotionally Abused: No    Physically Abused: No    Sexually Abused: No    Review of Systems: See HPI, otherwise negative ROS  Physical Exam: BP 117/63 (BP Location: Left Arm, Patient Position: Sitting, Cuff Size: Large)   Pulse 82   Temp 97.7 F (36.5 C) (Temporal)   Ht '5\' 6"'$  (1.676  m)   Wt 244 lb 12.8 oz (111 kg)   SpO2 99%   BMI 39.51 kg/m  General:   Alert,  Well-developed, well-nourished, pleasant and cooperative in NAD Neck:  Supple; no masses or thyromegaly. No significant cervical adenopathy. Lungs:   Clear throughout to auscultation.   No wheezes, crackles, or rhonchi. No acute distress. Heart:  Regular rate and rhythm; no murmurs, clicks, rubs,  or gallops. Abdomen: Non-distended, normal bowel sounds.  Soft and nontender without appreciable mass or hepatosplenomegaly.  Pulses:  Normal pulses noted. Extremities:  Without clubbing or edema.  Impression/Plan: 51 year old lady with abdominal bloating in the setting of constipation.  No alarm features.  Linzess was working very well for her previously.  She is up-to-date on colonoscopy.  GERD well controlled on twice daily PPI no dysphagia status post dilation.  Like to get her constipation rapidly controlled.  We have Linzess samples :on hand.  Recommendation   Lets revisit Linzess.  It was working for you very well.  Linzess 145 daily.  Samples provided.  We may have to go to another agent that she can afford depending on insurance coverage.  Repeat colonoscopy 2025.  Decrease Protonix or pantoprazole to 40 mg each morning 30 minutes before breakfast.  Office visit with [place] in 2 months.     Notice: This dictation was prepared with Dragon dictation along with smaller phrase technology. Any transcriptional errors that result from this process are unintentional and may not be corrected upon review.

## 2021-10-06 DIAGNOSIS — E1142 Type 2 diabetes mellitus with diabetic polyneuropathy: Secondary | ICD-10-CM | POA: Diagnosis not present

## 2021-10-13 DIAGNOSIS — M2550 Pain in unspecified joint: Secondary | ICD-10-CM | POA: Diagnosis not present

## 2021-10-13 DIAGNOSIS — R768 Other specified abnormal immunological findings in serum: Secondary | ICD-10-CM | POA: Diagnosis not present

## 2021-10-13 DIAGNOSIS — R7612 Nonspecific reaction to cell mediated immunity measurement of gamma interferon antigen response without active tuberculosis: Secondary | ICD-10-CM | POA: Diagnosis not present

## 2021-10-13 DIAGNOSIS — M1990 Unspecified osteoarthritis, unspecified site: Secondary | ICD-10-CM | POA: Diagnosis not present

## 2021-10-13 DIAGNOSIS — M791 Myalgia, unspecified site: Secondary | ICD-10-CM | POA: Diagnosis not present

## 2021-10-17 DIAGNOSIS — Z887 Allergy status to serum and vaccine status: Secondary | ICD-10-CM | POA: Diagnosis not present

## 2021-10-17 DIAGNOSIS — Z7189 Other specified counseling: Secondary | ICD-10-CM | POA: Diagnosis not present

## 2021-10-17 DIAGNOSIS — L309 Dermatitis, unspecified: Secondary | ICD-10-CM | POA: Diagnosis not present

## 2021-10-17 DIAGNOSIS — J40 Bronchitis, not specified as acute or chronic: Secondary | ICD-10-CM | POA: Diagnosis not present

## 2021-10-17 DIAGNOSIS — R69 Illness, unspecified: Secondary | ICD-10-CM | POA: Diagnosis not present

## 2021-10-20 ENCOUNTER — Telehealth: Payer: Self-pay | Admitting: Pulmonary Disease

## 2021-10-20 NOTE — Telephone Encounter (Signed)
Please advise.  [phone]

## 2021-10-24 ENCOUNTER — Telehealth: Payer: Self-pay | Admitting: Pulmonary Disease

## 2021-10-24 NOTE — Telephone Encounter (Signed)
Called Megan back. Left her know the message that was left by [facility] and she stated that the pt is severely confused. There is nothing our office needs to do and that the pt has been requested to fill out new pt paperwork by Dr Corky Sing office. Pt does not seem to understand that. Just an FYI for if pt calls again. Nothing further needed.

## 2021-10-24 NOTE — Telephone Encounter (Signed)
Called and spoke to patient and she states that Dr. Betsey Holiday office needed more information from her but they are asking about breathing and apneas and wants to know if we can fill out the form they are requesting. I told patient I would call Dr. Betsey Holiday office and ask for the info they are requesting.   ATC Dr. Betsey Holiday office at [phone]. No answer but lvm asking them to call back or fax over form for [place] to fill out to [facility] office [phone]. Nothing further needed for now.

## 2021-10-26 ENCOUNTER — Encounter: Payer: Self-pay | Admitting: *Deleted

## 2021-10-26 ENCOUNTER — Other Ambulatory Visit: Payer: Self-pay

## 2021-10-26 ENCOUNTER — Encounter: Payer: Self-pay | Admitting: Internal Medicine

## 2021-10-26 ENCOUNTER — Ambulatory Visit
Admission: RE | Admit: 2021-10-26 | Discharge: 2021-10-26 | Disposition: A | Discharge status: Home / Self Care | Payer: 59 | Source: Ambulatory Visit | Attending: Internal Medicine | Admitting: Internal Medicine

## 2021-10-26 ENCOUNTER — Ambulatory Visit (INDEPENDENT_AMBULATORY_CARE_PROVIDER_SITE_OTHER): Payer: 59 | Admitting: Internal Medicine

## 2021-10-26 VITALS — BP 109/76 | HR 73 | Resp 16 | Ht 66.0 in | Wt 245.0 lb

## 2021-10-26 DIAGNOSIS — Z227 Latent tuberculosis: Secondary | ICD-10-CM

## 2021-10-26 DIAGNOSIS — R7612 Nonspecific reaction to cell mediated immunity measurement of gamma interferon antigen response without active tuberculosis: Secondary | ICD-10-CM | POA: Diagnosis not present

## 2021-10-26 MED ORDER — VITAMIN B-6 25 MG PO TABS: 25.0000 mg | ORAL_TABLET | Freq: Every day | ORAL | 8 refills | Status: DC

## 2021-10-26 MED ORDER — ISONIAZID 300 MG PO TABS: 300.0000 mg | ORAL_TABLET | Freq: Every day | ORAL | 8 refills | Status: DC

## 2021-10-26 NOTE — Patient Instructions (Signed)
You were exposed to tuberculosis within the past 4 years. This is a bacteria that can hide in your body and caused a combination of damage including lung, brain, joint, gut, kidney... at this time I do not have suspicion of you having any active disease.   However, will need a chest xray to make sure, as sometimes it stays in the lung and can be quiet   This bacteria a lot of time just hide somewhere in your body and until you get weak (like being on immunosuppressant medication), it will come out and cause damage   So that is the reason we will treat you to try to get rid of this bacteria.    The medication I will have you on is isoniazid. This will go at least 6 months, ideally to 9 months. You'll take this along with vitamin b6 to protect your nerve...   Our office will call you once we have the xray   Once you start the medication, will also try to see you 4-6 weeks after that to check labs and monitor for toxicity
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
## 2021-10-31 ENCOUNTER — Telehealth: Payer: Self-pay

## 2021-10-31 NOTE — Telephone Encounter (Signed)
Called and spoke with both the patient and her spouse (per patient's request). Relayed results verbatim, "xray is normal" and to begin taking b6 and isoniazid. Patient and spouse verbalized understanding. Followup scheduled for 11/22/21 at 1:45 pm.  Binnie Kand, RN

## 2021-10-31 NOTE — Telephone Encounter (Signed)
-----   Message from Jabier Mutton, MD sent at 10/29/2021 11:18 AM EDT ----- Hi team Please let her know xray is normal  She can start her b6 and isoniazid  See me in aroubd 4 weeks  Thanks

## 2021-11-07 DIAGNOSIS — G40409 Other generalized epilepsy and epileptic syndromes, not intractable, without status epilepticus: Secondary | ICD-10-CM | POA: Diagnosis not present

## 2021-11-07 DIAGNOSIS — E1142 Type 2 diabetes mellitus with diabetic polyneuropathy: Secondary | ICD-10-CM | POA: Diagnosis not present

## 2021-11-07 DIAGNOSIS — M79606 Pain in leg, unspecified: Secondary | ICD-10-CM | POA: Diagnosis not present

## 2021-11-07 DIAGNOSIS — M25562 Pain in left knee: Secondary | ICD-10-CM | POA: Diagnosis not present

## 2021-11-07 DIAGNOSIS — Z79899 Other long term (current) drug therapy: Secondary | ICD-10-CM | POA: Diagnosis not present

## 2021-11-07 DIAGNOSIS — E668 Other obesity: Secondary | ICD-10-CM | POA: Diagnosis not present

## 2021-11-07 DIAGNOSIS — G44221 Chronic tension-type headache, intractable: Secondary | ICD-10-CM | POA: Diagnosis not present

## 2021-11-07 DIAGNOSIS — M62838 Other muscle spasm: Secondary | ICD-10-CM | POA: Diagnosis not present

## 2021-11-07 DIAGNOSIS — M542 Cervicalgia: Secondary | ICD-10-CM | POA: Diagnosis not present

## 2021-11-10 DIAGNOSIS — R768 Other specified abnormal immunological findings in serum: Secondary | ICD-10-CM | POA: Diagnosis not present

## 2021-11-10 DIAGNOSIS — M797 Fibromyalgia: Secondary | ICD-10-CM | POA: Diagnosis not present

## 2021-11-10 DIAGNOSIS — N1831 Chronic kidney disease, stage 3a: Secondary | ICD-10-CM | POA: Diagnosis not present

## 2021-11-10 DIAGNOSIS — M0609 Rheumatoid arthritis without rheumatoid factor, multiple sites: Secondary | ICD-10-CM | POA: Diagnosis not present

## 2021-11-10 DIAGNOSIS — R7612 Nonspecific reaction to cell mediated immunity measurement of gamma interferon antigen response without active tuberculosis: Secondary | ICD-10-CM | POA: Diagnosis not present

## 2021-11-10 DIAGNOSIS — Z79899 Other long term (current) drug therapy: Secondary | ICD-10-CM | POA: Diagnosis not present

## 2021-11-10 DIAGNOSIS — Z23 Encounter for immunization: Secondary | ICD-10-CM | POA: Diagnosis not present

## 2021-11-22 ENCOUNTER — Ambulatory Visit: Payer: 59 | Admitting: Internal Medicine

## 2021-11-22 ENCOUNTER — Other Ambulatory Visit: Payer: Self-pay

## 2021-11-22 ENCOUNTER — Telehealth: Payer: Self-pay | Admitting: Pulmonary Disease

## 2021-11-22 ENCOUNTER — Encounter: Payer: Self-pay | Admitting: Internal Medicine

## 2021-11-22 VITALS — BP 121/82 | HR 117 | Temp 98.1°F | Ht 66.0 in | Wt 260.0 lb

## 2021-11-22 DIAGNOSIS — Z227 Latent tuberculosis: Secondary | ICD-10-CM

## 2021-11-22 DIAGNOSIS — R0982 Postnasal drip: Secondary | ICD-10-CM | POA: Diagnosis not present

## 2021-11-22 DIAGNOSIS — H6993 Unspecified Eustachian tube disorder, bilateral: Secondary | ICD-10-CM | POA: Diagnosis not present

## 2021-11-22 NOTE — Telephone Encounter (Signed)
Spoke with pt and reviewed Sarah's advise as dictated. Pt stated understanding. Pt agreed to see Tammy Parrett in [place] on 11/25/21. Pt stated understanding. Nothing further needed at this time.

## 2021-11-22 NOTE — Telephone Encounter (Signed)
States when she blows her nose, unable to hear out of left side or stopped up.  Does ok during the day but worse at night.  Rattling in chest.  Has also had dry cough with phlegm sometimes.  Please advise.  [facility] [address] [facility] [place].

## 2021-11-22 NOTE — Telephone Encounter (Signed)
Primary Pulmonologist: Dr. Halford Chessman  Last office visit and with whom: 09/12/2021 Dr. Halford Chessman   What do we see them for (pulmonary problems): OSA/ allergic asthma/ allergic rhinitis with post nasal drip Last OV assessment/plan: see below   Was appointment offered to patient (explain)?  No availability in [place] until patients next appt in November and patient refused to go to [place].    Reason for call: Patient states that at last ov she was given nasal spray (atrovent) to help her nasal drip. She states it has made her runny nose worse and that her nasal drip is like a running faucet and constant. Patient states it is especially bad when she bends over and she is very frustrated because it is affecting her day to day activities. She blew her nose by holding one nostril and states her ears popped and she could not hear anything for a few seconds. She has had rattling in chest that Is worse at night. Has also had a cough with yellow green and white sputum. She states sometimes the cough is dry and sometimes it is productive.   Dr. Halford Chessman is out of office today so sending to DOD. Eric Form NP please advise. Thanks!  Allergies  Allergen Reactions   Metrizamide Rash and Swelling    Hands swelling/rash   Shellfish Allergy Swelling    Swells tongue   Amoxapine Nausea Only   Amoxicillin Other (See Comments)    WHAT IS THE REACTION?   Penicillin G Other (See Comments)    WHAT IS THE REACTION?   Codeine Itching   Hydrocodone Itching and Rash   Ivp Dye [Iodinated Contrast Media] Rash    Hands swelling/rash   Latex Rash    Immunization History  Administered Date(s) Administered   Influenza Split 12/06/2009   Influenza,inj,Quad PF,6+ Mos 10/12/2016, 10/22/2017, 10/25/2018, 11/10/2021   Influenza-Unspecified 03/08/2017   Moderna SARS-COV2 Booster Vaccination 06/18/2020   Moderna Sars-Covid-2 Vaccination 04/24/2019, 05/21/2019   Pneumococcal Polysaccharide-23 10/22/2013    Assessment/Plan:     Allergic asthma. - had difficulty with DPI and HFA inhalers - continue budesonide, perforomist, singulair - prn albuterol - if her breathing symptoms persist after restarting her nebulizer treatments, then she might need additional testing   Allergic rhinitis with post nasal drip. - will add prn atrovent nasal spray - continue zyrtec, flonase, singulair   Obstructive sleep apnea. - intolerant of CPAP - will arrange for referral to Dr. Augustina Mood to assess whether she is a candidate for an oral appliance   Obesity. - discussed how weight can impact sleep and risk for sleep disordered breathing - discussed options to assist with weight loss: combination of diet modification, cardiovascular and strength training exercises     Time Spent Involved in Patient Care on Day of Examination:  36 minutes   Follow up:    Patient Instructions  Restart using your nebulizer medicines   Atrovent nasal spray 1 spray in each nostril three times per day as needed for runny nose   Will arrange for referral to Dr. Augustina Mood to assess for an oral appliance to treat obstructive sleep apnea   Follow up in 4 months

## 2021-11-22 NOTE — Telephone Encounter (Signed)
Jennifer Spatz, NP  Fritzi Mandes D, CMA; [facility] Triage Pool 11 minutes ago (9:42 AM)    Please confirm she is taking her zyrtec, flonase, singulair.  She needs to use these each day.  See if you can get her scheduled to see a provider this week.  If she feels the Atrovent is making her PND worse, she can stop it until she is seen. Thanks   I called the patient and she reports that she is taking all her medications. She reports that its dripping like a faucet and she has to keep something in her nose to go out to the store. She is determined that a provider would send in something to stop the dripping as the nasal spray Atrovent is not working and the [facility] is not helping as well.  She feels that she has to use a kleenex in her nose to keep it from dripping on her shirt . She reports this is not normal. Do you have any other recommendation for a dripping nose? Please advise.  Please advise.

## 2021-11-22 NOTE — Telephone Encounter (Signed)
ATC patient.  LMTCB. 

## 2021-11-22 NOTE — Patient Instructions (Addendum)
Continue taking your TB medications: 1) isoniazid 300 mg by mouth once a day 2) vitamin b6 25 mg once a day  Again this should be at least 6 months from 9/21 or ideally up to 9 months as long as you can tolerate   Check blood tests today make sure no liver/kidney toxicity   See me again in around 6 weeks (every 6 weeks) to repeat labs until 8 months total
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
## 2021-11-23 ENCOUNTER — Other Ambulatory Visit: Payer: Self-pay | Admitting: Internal Medicine

## 2021-11-23 LAB — COMPLETE METABOLIC PANEL WITH GFR
AG Ratio: 1.3 (calc) (ref 1.0–2.5)
ALT: 21 U/L (ref 6–29)
AST: 19 U/L (ref 10–35)
Albumin: 4 g/dL (ref 3.6–5.1)
Alkaline phosphatase (APISO): 95 U/L (ref 37–153)
BUN/Creatinine Ratio: 15 (calc) (ref 6–22)
BUN: 18 mg/dL (ref 7–25)
CO2: 24 mmol/L (ref 20–32)
Calcium: 9.3 mg/dL (ref 8.6–10.4)
Chloride: 107 mmol/L (ref 98–110)
Creat: 1.18 mg/dL — ABNORMAL HIGH (ref 0.50–1.03)
Globulin: 3.1 g/dL (calc) (ref 1.9–3.7)
Glucose, Bld: 162 mg/dL — ABNORMAL HIGH (ref 65–99)
Potassium: 4.1 mmol/L (ref 3.5–5.3)
Sodium: 141 mmol/L (ref 135–146)
Total Bilirubin: 0.4 mg/dL (ref 0.2–1.2)
Total Protein: 7.1 g/dL (ref 6.1–8.1)
eGFR: 56 mL/min/{1.73_m2} — ABNORMAL LOW (ref >=60)

## 2021-11-23 LAB — CBC
HCT: 38.3 % (ref 35.0–45.0)
Hemoglobin: 12 g/dL (ref 11.7–15.5)
MCH: 23.4 pg — ABNORMAL LOW (ref 27.0–33.0)
MCHC: 31.3 g/dL — ABNORMAL LOW (ref 32.0–36.0)
MCV: 74.8 fL — ABNORMAL LOW (ref 80.0–100.0)
MPV: 10.5 fL (ref 7.5–12.5)
Platelets: 334 10*3/uL (ref 140–400)
RBC: 5.12 10*6/uL — ABNORMAL HIGH (ref 3.80–5.10)
RDW: 16.8 % — ABNORMAL HIGH (ref 11.0–15.0)
WBC: 11.1 10*3/uL — ABNORMAL HIGH (ref 3.8–10.8)

## 2021-11-24 ENCOUNTER — Telehealth: Payer: Self-pay

## 2021-11-24 NOTE — Telephone Encounter (Signed)
-----   Message from Jabier Mutton, MD sent at 11/24/2021 11:36 AM EDT ----- Sorry I forgot to attach the recipients. Please see my previous comment  thanks

## 2021-11-24 NOTE — Telephone Encounter (Signed)
Patient aware of results.   Jennifer [name], CMA  

## 2021-11-25 ENCOUNTER — Ambulatory Visit: Payer: 59 | Admitting: Adult Health

## 2021-12-05 DIAGNOSIS — Z713 Dietary counseling and surveillance: Secondary | ICD-10-CM | POA: Diagnosis not present

## 2021-12-05 DIAGNOSIS — J309 Allergic rhinitis, unspecified: Secondary | ICD-10-CM | POA: Diagnosis not present

## 2021-12-05 DIAGNOSIS — R69 Illness, unspecified: Secondary | ICD-10-CM | POA: Diagnosis not present

## 2021-12-05 DIAGNOSIS — R61 Generalized hyperhidrosis: Secondary | ICD-10-CM | POA: Diagnosis not present

## 2021-12-05 DIAGNOSIS — Z7182 Exercise counseling: Secondary | ICD-10-CM | POA: Diagnosis not present

## 2021-12-05 DIAGNOSIS — F17200 Nicotine dependence, unspecified, uncomplicated: Secondary | ICD-10-CM | POA: Diagnosis not present

## 2021-12-05 DIAGNOSIS — G40909 Epilepsy, unspecified, not intractable, without status epilepticus: Secondary | ICD-10-CM | POA: Diagnosis not present

## 2021-12-05 DIAGNOSIS — E785 Hyperlipidemia, unspecified: Secondary | ICD-10-CM | POA: Diagnosis not present

## 2021-12-05 DIAGNOSIS — Z79899 Other long term (current) drug therapy: Secondary | ICD-10-CM | POA: Diagnosis not present

## 2021-12-05 DIAGNOSIS — N951 Menopausal and female climacteric states: Secondary | ICD-10-CM | POA: Diagnosis not present

## 2021-12-05 DIAGNOSIS — Z6841 Body Mass Index (BMI) 40.0 and over, adult: Secondary | ICD-10-CM | POA: Diagnosis not present

## 2021-12-09 DIAGNOSIS — I110 Hypertensive heart disease with heart failure: Secondary | ICD-10-CM | POA: Diagnosis not present

## 2021-12-09 DIAGNOSIS — J4489 Other specified chronic obstructive pulmonary disease: Secondary | ICD-10-CM | POA: Diagnosis not present

## 2021-12-09 DIAGNOSIS — R69 Illness, unspecified: Secondary | ICD-10-CM | POA: Diagnosis not present

## 2021-12-09 DIAGNOSIS — G4730 Sleep apnea, unspecified: Secondary | ICD-10-CM | POA: Diagnosis not present

## 2021-12-09 DIAGNOSIS — G629 Polyneuropathy, unspecified: Secondary | ICD-10-CM | POA: Diagnosis not present

## 2021-12-09 DIAGNOSIS — G40909 Epilepsy, unspecified, not intractable, without status epilepticus: Secondary | ICD-10-CM | POA: Diagnosis not present

## 2021-12-09 DIAGNOSIS — J309 Allergic rhinitis, unspecified: Secondary | ICD-10-CM | POA: Diagnosis not present

## 2021-12-09 DIAGNOSIS — D6869 Other thrombophilia: Secondary | ICD-10-CM | POA: Diagnosis not present

## 2021-12-09 DIAGNOSIS — I4891 Unspecified atrial fibrillation: Secondary | ICD-10-CM | POA: Diagnosis not present

## 2021-12-09 DIAGNOSIS — K219 Gastro-esophageal reflux disease without esophagitis: Secondary | ICD-10-CM | POA: Diagnosis not present

## 2021-12-09 DIAGNOSIS — I509 Heart failure, unspecified: Secondary | ICD-10-CM | POA: Diagnosis not present

## 2022-01-02 ENCOUNTER — Ambulatory Visit (INDEPENDENT_AMBULATORY_CARE_PROVIDER_SITE_OTHER): Payer: 59 | Admitting: Pulmonary Disease

## 2022-01-02 ENCOUNTER — Encounter: Payer: Self-pay | Admitting: Pulmonary Disease

## 2022-01-02 VITALS — BP 138/86 | HR 90 | Temp 98.2°F | Ht 66.0 in | Wt 264.8 lb

## 2022-01-02 DIAGNOSIS — J310 Chronic rhinitis: Secondary | ICD-10-CM | POA: Diagnosis not present

## 2022-01-02 DIAGNOSIS — G4733 Obstructive sleep apnea (adult) (pediatric): Secondary | ICD-10-CM | POA: Diagnosis not present

## 2022-01-02 DIAGNOSIS — Z789 Other specified health status: Secondary | ICD-10-CM

## 2022-01-02 DIAGNOSIS — J4540 Moderate persistent asthma, uncomplicated: Secondary | ICD-10-CM

## 2022-01-02 MED ORDER — MOMETASONE FURO-FORMOTEROL FUM 100-5 MCG/ACT IN AERO: 2.0000 | INHALATION_SPRAY | Freq: Two times a day (BID) | RESPIRATORY_TRACT | 5 refills | Status: DC

## 2022-01-02 NOTE — Patient Instructions (Signed)
You should be able to call Dr. Kerin Perna office to schedule an appointment for the mouth piece to treat sleep apnea  Dulera two puffs in the morning and two puffs in the evening, and rinse your mouth after each use  Use the spacer device with dulera  Will arrange for referral to ENT  Follow up in 4 months

## 2022-01-02 NOTE — Progress Notes (Signed)
[facility]  Chief Complaint  Patient presents with   Follow-up    Runny nose is still bothering patient     Constitutional:  BP 138/86   Pulse 90   Temp 98.2 F (36.8 C)   Ht '5\' 6"'$  (1.676 m)   Wt 264 lb 12.8 oz (120.1 kg)   SpO2 96% Comment: ra  BMI 42.74 kg/m   Past Medical History:  HTN, CKD, DM 2, GERD, Ovarian cancer, PSTD, Seizurses, Substance abuse, Anxiety, Allergies, OA, RA, positive ANA, positive Quantiferon gold, Fibromyalgia  Past Surgical History:  She  has a past surgical history that includes Partial hysterectomy; Cholecystectomy; Tonsillectomy; Hernia repair; Colonoscopy (03/01/2011); Esophagogastroduodenoscopy (2008); Esophagogastroduodenoscopy (2012); Esophagogastroduodenoscopy (N/A, 11/17/2013); Skin cancer excision; Abdominal hysterectomy; Colonoscopy with propofol (N/A, 08/08/2018); Esophagogastroduodenoscopy (egd) with propofol (N/A, 08/08/2018); maloney dilation (08/08/2018); Knee arthroscopy; Esophagogastroduodenoscopy (egd) with propofol (N/A, 06/02/2021); and maloney dilation (N/A, 06/02/2021).  Brief Summary:  Jennifer [name] is a 51 y.o. female former smoker with asthma, and obstructive sleep apnea.      Subjective:   She continues to have nasal drainage.  She says it feels like a faucet at times.  She has been using zyrtec, flonase, singulair and atrovent.  She never got any other nebulizer medicines.  She is needing to use albuterol 3 or 4 times per day.  She was found to have positive quantiferon gold and was seen by ID.  She has a few more weeks of INH therapy.  Physical Exam:   Appearance - well kempt   ENMT - no sinus tenderness, no oral exudate, no LAN, Mallampati 3 airway, no stridor  Respiratory - equal breath sounds bilaterally, no wheezing or rales  CV - s1s2 regular rate and rhythm, no murmurs  Ext - no clubbing, no edema  Skin - no rashes  Psych - normal mood and affect      Pulmonary  testing:  PFT 06/29/20 >> FEV1 2.52 (99%), FEV1% 85, TLC 4.87 (90%), DLCO 95%, +BD  Sleep Tests:  PSG 04/29/13 >> AHI 0.4, SpO2 low 89% HST 09/02/20 >> AHI 7.8, SpO2 low 85%  Social History:  She  reports that she has been smoking cigarettes. She started smoking about 37 years ago. She has a 26.00 pack-year smoking history. She has never used smokeless tobacco. She reports that she does not currently use alcohol. She reports that she does not currently use drugs after having used the following drugs: Marijuana.  Family History:  Her family history includes Alcohol abuse in her father, maternal uncle, mother, paternal grandmother, and paternal uncle; Arthritis in her mother; Breast cancer in her maternal grandmother; Breast cancer (age of onset: 2) in her daughter; COPD in her mother; Cancer in her maternal grandfather, maternal grandmother, paternal grandfather, and paternal grandmother; Colon cancer in her maternal grandfather; Colon polyps (age of onset: 38) in her mother; Crohn's disease in her cousin and maternal aunt; Depression in her mother; Drug abuse in her father, mother, and paternal uncle; Early death (age of onset: 96) in her father; Heart disease in her mother; Hyperlipidemia in her mother; Hypertension in her mother; Learning disabilities in her father and mother; Mental illness in her mother; Pancreatic cancer in her maternal grandfather.     Assessment/Plan:   Allergic asthma. - had difficulty with DPI inhalers - will have her try dulera 100 two puffs bid with a spacer device - continue singulair - prn albuterol  Chronic rhinitis with post nasal drip. -  this has been persistent despite being on an aggressive medical regimen (zyrtec, flonase, singulair, atrovent) - will arrange for referral to ENT  Obstructive sleep apnea. - intolerant of CPAP - advised that she should be able to call Dr. Kerin Perna office directly to schedule a consultation visit  Social  determinants of health. - she has limit reading skills that can impact her ability to access health care   Time Spent Involved in Patient Care on Day of Examination:  37 minutes  Follow up:   Patient Instructions  You should be able to call Dr. Kerin Perna office to schedule an appointment for the mouth piece to treat sleep apnea  Dulera two puffs in the morning and two puffs in the evening, and rinse your mouth after each use  Use the spacer device with dulera  Will arrange for referral to ENT  Follow up in 4 months  Medication List:   Allergies as of 01/02/2022       Reactions   Metrizamide Rash, Swelling   Hands swelling/rash   Shellfish Allergy Swelling   Swells tongue   Amoxapine Nausea Only   Amoxicillin Other (See Comments)   WHAT IS THE REACTION?   Penicillin G Other (See Comments)   WHAT IS THE REACTION?   Codeine Itching   Hydrocodone Itching, Rash   Ivp Dye [iodinated Contrast Media] Rash   Hands swelling/rash   Latex Rash        Medication List        Accurate as of January 02, 2022 11:14 AM. If you have any questions, ask your nurse or doctor.          albuterol 108 (90 Base) MCG/ACT inhaler Commonly known as: VENTOLIN HFA Inhale 2 puffs into the lungs every 6 (six) hours as needed for wheezing or shortness of breath.   albuterol (2.5 MG/3ML) 0.083% nebulizer solution Commonly known as: PROVENTIL Take 3 mLs (2.5 mg total) by nebulization every 6 (six) hours as needed for wheezing or shortness of breath.   cetirizine 10 MG tablet Commonly known as: ZYRTEC Take 1 tablet (10 mg total) by mouth every morning.   cyclobenzaprine 10 MG tablet Commonly known as: FLEXERIL Take 10 mg by mouth at bedtime as needed for muscle spasms.   EPINEPHrine 0.15 MG/0.15ML injection Commonly known as: ADRENACLICK Inject 4.13 mg into the muscle as needed for anaphylaxis.   escitalopram 5 MG tablet Commonly known as: LEXAPRO Take 5 mg by mouth  daily.   fluticasone 50 MCG/ACT nasal spray Commonly known as: FLONASE Place 1 spray into both nostrils daily.   gabapentin 400 MG capsule Commonly known as: NEURONTIN Take 400 mg by mouth daily.   ipratropium 0.03 % nasal spray Commonly known as: ATROVENT Place 2 sprays into both nostrils 3 (three) times daily as needed for rhinitis.   isoniazid 300 MG tablet Commonly known as: NYDRAZID Take 1 tablet (300 mg total) by mouth daily.   lamoTRIgine 200 MG tablet Commonly known as: LAMICTAL Take 200 mg by mouth daily.   levETIRAcetam 500 MG 24 hr tablet Commonly known as: KEPPRA XR Take 2,000 mg by mouth daily.   LINZESS PO Take by mouth.   mometasone-formoterol 100-5 MCG/ACT Aero Commonly known as: DULERA Inhale 2 puffs into the lungs 2 (two) times daily. Started by: Chesley Mires, MD   montelukast 10 MG tablet Commonly known as: SINGULAIR TAKE (1) TABLET BY MOUTH DAILY AT BEDTIME.   ondansetron 4 MG tablet Commonly known as: ZOFRAN  Take 1 tablet (4 mg total) by mouth every 8 (eight) hours as needed for nausea or vomiting.   pantoprazole 40 MG tablet Commonly known as: PROTONIX TAKE 1 TABLET BY MOUTH TWICE DAILY BEFORE MEALS.   propranolol 20 MG tablet Commonly known as: INDERAL Take 20 mg by mouth 2 (two) times daily.   pyridOXINE 25 MG tablet Commonly known as: VITAMIN B6 Take 1 tablet (25 mg total) by mouth daily.   QUEtiapine 200 MG tablet Commonly known as: SEROQUEL Take 200 mg by mouth at bedtime.   sulfamethoxazole-trimethoprim 800-160 MG tablet Commonly known as: BACTRIM DS Take 1 tablet by mouth 2 (two) times daily.   traMADol 50 MG tablet Commonly known as: ULTRAM Take 50 mg by mouth 3 (three) times daily as needed for moderate pain.   Vitamin D (Ergocalciferol) 1.25 MG (50000 UNIT) Caps capsule Commonly known as: DRISDOL Take 1 capsule (50,000 Units total) by mouth every 7 (seven) days.        Signature:  Chesley Mires, MD [place] Pager - [phone] 01/02/2022, 11:14 AM

## 2022-01-11 ENCOUNTER — Ambulatory Visit: Payer: 59 | Admitting: Internal Medicine

## 2022-01-26 ENCOUNTER — Ambulatory Visit (INDEPENDENT_AMBULATORY_CARE_PROVIDER_SITE_OTHER): Payer: Medicaid Other | Admitting: Neurology

## 2022-01-26 ENCOUNTER — Ambulatory Visit: Payer: 59 | Admitting: Neurology

## 2022-01-26 ENCOUNTER — Ambulatory Visit
Admission: RE | Admit: 2022-01-26 | Discharge: 2022-01-26 | Disposition: A | Discharge status: Home / Self Care | Payer: Medicaid Other | Source: Ambulatory Visit | Attending: Neurology | Admitting: Neurology

## 2022-01-26 ENCOUNTER — Encounter: Payer: Self-pay | Admitting: Neurology

## 2022-01-26 VITALS — BP 122/85 | HR 90 | Ht 66.0 in | Wt 268.5 lb

## 2022-01-26 DIAGNOSIS — R269 Unspecified abnormalities of gait and mobility: Secondary | ICD-10-CM | POA: Diagnosis not present

## 2022-01-26 DIAGNOSIS — G8929 Other chronic pain: Secondary | ICD-10-CM

## 2022-01-26 DIAGNOSIS — R202 Paresthesia of skin: Secondary | ICD-10-CM | POA: Insufficient documentation

## 2022-01-26 DIAGNOSIS — M5441 Lumbago with sciatica, right side: Secondary | ICD-10-CM

## 2022-01-26 DIAGNOSIS — G40909 Epilepsy, unspecified, not intractable, without status epilepticus: Secondary | ICD-10-CM

## 2022-01-26 MED ORDER — LAMOTRIGINE 200 MG PO TABS: 200.0000 mg | ORAL_TABLET | Freq: Every day | ORAL | 11 refills | Status: DC

## 2022-01-26 NOTE — Patient Instructions (Addendum)
Orders Placed This Encounter  Procedures   MR BRAIN W WO CONTRAST   MR LUMBAR SPINE WO CONTRAST   DG Lumbar Spine 2-3 Views   Ambulatory referral to Physical Therapy   Stop Keppra, Gabapentin, Inderal=propranolol  You are taking Lamotrigine '200mg'$  once daily, will increase to one tab twice a day  1st week, 1/2 tab am, one at night  2nd week, 1 tab twice a day        Address: [address], [place], [place] [address]  Phone: [phone]

## 2022-01-26 NOTE — Progress Notes (Signed)
Chief Complaint  Patient presents with   New Patient (Initial Visit)    Rm 15. Accompanied by husband. NX Javana Schey LV 2020/Paper/James Austin/Elsa Bucio NP/seizures, neuropathy.      ASSESSMENT AND PLAN  Jennifer [name] is a 51 y.o. female   History of seizure Frequent new onset headaches,  Complete evaluation with MRI of the brain with without contrast  Last reported seizure was in 2015, on polypharmacy treatment, complains of side effect, will stop Keppra, increase lamotrigine from 200 mg daily to twice a day,  I went over her medication list in detail, make modifications to simplify her medication regime  Chronic low back pain, with acute worsening Gait abnormality  Referred to physical therapy,  X-ray of lumbar spine to rule out acute bony structure abnormality  MRI of lumbar spine   Return To Clinic With NP In 4 Months  DIAGNOSTIC DATA (LABS, IMAGING, TESTING) - I reviewed patient records, labs, notes, testing and imaging myself where available. Laboratory evaluations in October 2023, CMP showed mild elevation of creatinine 1.23, normal CBC hemoglobin of 12.4, A1c of 5.9  MEDICAL HISTORY:  Jennifer [name] is a 51 year old female, accompanied by her husband, seen in request by primary care nurse practitioner Bucio, [place] Dragon, to continue her neurological care at our clinic, initial evaluation was on January 26, 2022, she was previously seen by [facility] neurologist Dr. Merlene Laughter, who has closed his neurological practice   I reviewed and summarized the referring note. PMHX, extensive records from Dr.Doonquah's office Bipolar, on polypharmacy Ovarian Cancer was treated in 2000,  DM HTN HLD PTSD  History of substance abuse, cocaine in young, quit for many years. Smoke,  She had seizure, 2015, frequent seizure spells with a couple years of span, in the setting of extreme stress, preceding by headache, confusion, feeling nervous, mild bilateral hands tremor, then would involve  into whole-body tremor, sometimes with bowel incontinence, lasting 10 to 15 minutes, she was treated on Keppra slow titrating dose now 750 mg 4 tablets every night, does complains of side effect, also feeling anxious  Last reported seizure activity in 2015  She carries a diagnosis of bipolar disorder, PTSD, on polypharmacy for her mood disorder, this include lamotrigine 200 mg every night, Lexapro 5 mg daily, Seroquel 200 mg every night  She also has a history of chronic low back pain, frequent flareup, most recent flareup was few weeks ago, she get up after bending over to pick up her telephone cord, she felt her lower back has shifted, sharp radiating pain, she could not move for few minutes, then was able to walk, ever since then, she complains of worsening right-sided low back pain, radiating pain to right hip, gait abnormality, worsening pain bearing weight  She had long history of diabetes, no complaints of bilateral feet numbness tingling, urinary urgency, frequency,  She also complains of worsening headache, retro-orbital area, sometimes occipital region, shooting pain from front to back  PHYSICAL EXAM:   Vitals:   01/26/22 1005  BP: 122/85  Pulse: 90  Weight: 268 lb 8 oz (121.8 kg)  Height: '5\' 6"'$  (1.676 m)   Not recorded     Body mass index is 43.34 kg/m.  PHYSICAL EXAMNIATION:  Gen: NAD, conversant, well nourised, well groomed                     Cardiovascular: Regular rate rhythm, no peripheral edema, warm, nontender. Eyes: Conjunctivae clear without exudates or hemorrhage Neck: Supple, no  carotid bruits. Pulmonary: Clear to auscultation bilaterally   NEUROLOGICAL EXAM:  MENTAL STATUS: Speech/cognition: Depressed looking, obese, awake, alert, oriented to history taking and casual conversation CRANIAL NERVES: CN II: Visual fields are full to confrontation. Pupils are round equal and briskly reactive to light. CN III, IV, VI: extraocular movement are normal. No  ptosis.  Left lateral eye corner mild erythematous CN V: Facial sensation is intact to light touch CN VII: Face is symmetric with normal eye closure  CN VIII: Hearing is normal to causal conversation. CN IX, X: Phonation is normal. CN XI: Head turning and shoulder shrug are intact  MOTOR: There is no pronator drift of out-stretched arms. Muscle bulk and tone are normal. Muscle strength is normal.  Mild bilateral lower extremity pitting edema  REFLEXES: Reflexes are 2 and symmetric at the biceps, triceps, knees, and absent at ankles. Plantar responses are flexor.  SENSORY: Mildly length dependent decreased to light touch, pinprick, vibratory sensation  COORDINATION: There is no trunk or limb dysmetria noted.  GAIT/STANCE: Need push-up to get up from seated position, antalgic, also limited by her big body habitus  REVIEW OF SYSTEMS:  Full 14 system review of systems performed and notable only for as above All other review of systems were negative.   ALLERGIES: Allergies  Allergen Reactions   Metrizamide Rash and Swelling    Hands swelling/rash   Shellfish Allergy Swelling    Swells tongue   Amoxapine Nausea Only   Amoxicillin Other (See Comments)    WHAT IS THE REACTION?   Other Itching and Swelling    Shingles vaccine   Penicillin G Other (See Comments)    WHAT IS THE REACTION?   Codeine Itching   Hydrocodone Itching and Rash   Ivp Dye [Iodinated Contrast Media] Rash    Hands swelling/rash   Latex Rash    HOME MEDICATIONS: Current Outpatient Medications  Medication Sig Dispense Refill   albuterol (PROVENTIL) (2.5 MG/3ML) 0.083% nebulizer solution Take 3 mLs (2.5 mg total) by nebulization every 6 (six) hours as needed for wheezing or shortness of breath. 180 mL 5   albuterol (VENTOLIN HFA) 108 (90 Base) MCG/ACT inhaler Inhale 2 puffs into the lungs every 6 (six) hours as needed for wheezing or shortness of breath. 8 g 5   cetirizine (ZYRTEC) 10 MG tablet Take 1 tablet  (10 mg total) by mouth every morning. 90 tablet 3   cyclobenzaprine (FLEXERIL) 10 MG tablet Take 10 mg by mouth at bedtime as needed for muscle spasms.     EPINEPHrine 0.15 MG/0.15ML IJ injection Inject 0.15 mg into the muscle as needed for anaphylaxis.     escitalopram (LEXAPRO) 5 MG tablet Take 5 mg by mouth daily.     fluticasone (FLONASE) 50 MCG/ACT nasal spray Place 1 spray into both nostrils daily. 16 g 11   gabapentin (NEURONTIN) 400 MG capsule Take 400 mg by mouth daily.     ipratropium (ATROVENT) 0.03 % nasal spray Place 2 sprays into both nostrils 3 (three) times daily as needed for rhinitis. 30 mL 12   isoniazid (NYDRAZID) 300 MG tablet Take 1 tablet (300 mg total) by mouth daily. 30 tablet 8   lamoTRIgine (LAMICTAL) 200 MG tablet Take 200 mg by mouth daily.     levETIRAcetam (KEPPRA XR) 500 MG 24 hr tablet Take 2,000 mg by mouth daily.     linaCLOtide (LINZESS PO) Take by mouth.     mometasone-formoterol (DULERA) 100-5 MCG/ACT AERO Inhale 2 puffs into the  lungs 2 (two) times daily. 1 each 5   montelukast (SINGULAIR) 10 MG tablet TAKE (1) TABLET BY MOUTH DAILY AT BEDTIME. 28 tablet 11   ondansetron (ZOFRAN) 4 MG tablet Take 1 tablet (4 mg total) by mouth every 8 (eight) hours as needed for nausea or vomiting. 60 tablet 3   pantoprazole (PROTONIX) 40 MG tablet TAKE 1 TABLET BY MOUTH TWICE DAILY BEFORE MEALS. 60 tablet 3   propranolol (INDERAL) 20 MG tablet Take 20 mg by mouth 2 (two) times daily.     pyridOXINE (VITAMIN B6) 25 MG tablet Take 1 tablet (25 mg total) by mouth daily. 30 tablet 8   QUEtiapine (SEROQUEL) 200 MG tablet Take 200 mg by mouth at bedtime.     sulfamethoxazole-trimethoprim (BACTRIM DS) 800-160 MG tablet Take 1 tablet by mouth 2 (two) times daily.     traMADol (ULTRAM) 50 MG tablet Take 50 mg by mouth 3 (three) times daily as needed for moderate pain.     Vitamin D, Ergocalciferol, (DRISDOL) 1.25 MG (50000 UT) CAPS capsule Take 1 capsule (50,000 Units total) by  mouth every 7 (seven) days. 5 capsule 11   No current facility-administered medications for this visit.    PAST MEDICAL HISTORY: Past Medical History:  Diagnosis Date   Allergy    Anemia    Anxiety    Arthritis    neck   Asthma    Chronic kidney disease, stage 2 (mild) 01/21/2020   Diabetes mellitus without complication (HCC)    GERD (gastroesophageal reflux disease)    History of flexible sigmoidoscopy 1999   Normal to 60cm,   Hypertension    Neuromuscular disorder ([place])    Ovarian cancer ([place])    skin cancer   Panic attack    PTSD (post-traumatic stress disorder)    S/P endoscopy 03/2010   Nl, s/p 56-French Maloney dilator, biopsy benign   S/P endoscopy 2008   Mild gastritis   Seizures (HCC)    Sleep apnea    Substance abuse ([place])    drug addiction    PAST SURGICAL HISTORY: Past Surgical History:  Procedure Laterality Date   ABDOMINAL HYSTERECTOMY     ovarian tumors   CHOLECYSTECTOMY     COLONOSCOPY  03/01/2011   [place]k-External hemorrhoids/otherwise normal rectum and colon.   COLONOSCOPY WITH PROPOFOL N/A 08/08/2018   entire examined colon is normal. The distal rectum and anal verge are normal on retroflexion view. Repeat in 5 years.    ESOPHAGOGASTRODUODENOSCOPY  2008   gastritis   ESOPHAGOGASTRODUODENOSCOPY  2012   Moderate sized hiatal hernia, non-H. pylori gastritis   ESOPHAGOGASTRODUODENOSCOPY N/A 11/17/2013   RMR: Mild erosive reflux esophagitis. Patulous EG junction   ESOPHAGOGASTRODUODENOSCOPY (EGD) WITH PROPOFOL N/A 08/08/2018   Erosive reflux esophagitis. Dilated. Small hiatal hernia. Otherwise normal.    ESOPHAGOGASTRODUODENOSCOPY (EGD) WITH PROPOFOL N/A 06/02/2021   Procedure: ESOPHAGOGASTRODUODENOSCOPY (EGD) WITH PROPOFOL;  Surgeon: Daneil Dolin, MD;  [facility];  Service: Endoscopy;  Laterality: N/A;  2:00pm   HERNIA REPAIR     Incisional with mesh   KNEE ARTHROSCOPY     left knee surgery in [place]   MALONEY DILATION  08/08/2018    Procedure: MALONEY DILATION;  Surgeon: Daneil Dolin, MD;  Location: [facility];  Service: Endoscopy;;   MALONEY DILATION N/A 06/02/2021   Procedure: Keturah Shavers;  Surgeon: Daneil Dolin, MD;  Location: [facility];  Service: Endoscopy;  Laterality: N/A;   PARTIAL HYSTERECTOMY  SKIN CANCER EXCISION     left bicep   TONSILLECTOMY      FAMILY HISTORY: Family History  Problem Relation Age of Onset   Crohn's disease Maternal Aunt    Breast cancer Maternal Grandmother    Cancer Maternal Grandmother    Colon cancer Maternal Grandfather        78   Pancreatic cancer Maternal Grandfather    Cancer Maternal Grandfather    Crohn's disease Cousin        x 2 cousins   Breast cancer Daughter 83   Arthritis Mother    COPD Mother    Depression Mother    Drug abuse Mother    Heart disease Mother    Hypertension Mother    Hyperlipidemia Mother    Learning disabilities Mother    Mental illness Mother    Alcohol abuse Mother    Colon polyps Mother 56       tubular adenoma   Early death Father 52       murder   Alcohol abuse Father    Drug abuse Father    Learning disabilities Father    Cancer Paternal Grandmother    Alcohol abuse Paternal Grandmother    Cancer Paternal Grandfather    Alcohol abuse Maternal Uncle    Alcohol abuse Paternal Uncle    Drug abuse Paternal Uncle     SOCIAL HISTORY: Social History   Socioeconomic History   Marital status: Married    Spouse name: [name]   Number of children: Not on file   Years of education: Not on file   Highest education level: 11th grade  Occupational History   Occupation: disabled    Fish farm manager: NOT EMPLOYED  Tobacco Use   Smoking status: Every Day    Packs/day: 1.00    Years: 26.00    Total pack years: 26.00    Types: Cigarettes    Start date: 05/03/1984    Last attempt to quit: 12/27/2019    Years since quitting: 2.0   Smokeless tobacco: Never   Tobacco comments:    Patient states she is smoking based  off her mood.   Vaping Use   Vaping Use: Never used  Substance and Sexual Activity   Alcohol use: Not Currently    Comment: occassionally   Drug use: Not Currently    Types: Marijuana   Sexual activity: Not Currently    Birth control/protection: Surgical  Other Topics Concern   Not on file  Social History Narrative   Lives at home with her husband.   Right handed.    1-2 cups of coffee 3 times per wk.    Intermittent soda.   Social Determinants of Health   Financial Resource Strain: Low Risk  (05/28/2019)   Overall Financial Resource Strain (CARDIA)    Difficulty of Paying Living Expenses: Not hard at all  Food Insecurity: No Food Insecurity (05/28/2019)   Hunger Vital Sign    Worried About Running Out of Food in the Last Year: Never true    Ran Out of Food in the Last Year: Never true  Transportation Needs: No Transportation Needs (05/28/2019)   PRAPARE - Hydrologist (Medical): No    Lack of Transportation (Non-Medical): No  Physical Activity: Inactive (05/28/2019)   Exercise Vital Sign    Days of Exercise per Week: 0 days    Minutes of Exercise per Session: 0 min  Stress: Stress Concern Present (05/28/2019)   [employer] of  Occupational Health - Occupational Stress Questionnaire    Feeling of Stress : To some extent  Social Connections: Moderately Integrated (05/28/2019)   Social Connection and Isolation Panel [NHANES]    Frequency of Communication with Friends and Family: More than three times a week    Frequency of Social Gatherings with Friends and Family: More than three times a week    Attends Religious Services: 1 to 4 times per year    Active Member of [employer] or Organizations: No    Attends Archivist Meetings: Never    Marital Status: Married  Human resources officer Violence: Not At Risk (05/28/2019)   Humiliation, Afraid, Rape, and Kick questionnaire    Fear of Current or Ex-Partner: No    Emotionally Abused: No    Physically  Abused: No    Sexually Abused: No      Marcial Pacas, M.D. Ph.D.  [facility] [address], [place], [place] [address] Ph: [phone] Fax: [phone]  CC:  [address], [address] [place],  [place] [address]  Bucio, [place] Dragon, FNP

## 2022-02-01 ENCOUNTER — Telehealth: Payer: Self-pay

## 2022-02-01 ENCOUNTER — Telehealth: Payer: Self-pay | Admitting: Neurology

## 2022-02-01 NOTE — Telephone Encounter (Signed)
I spoke with the patient and her husband. I advised them of the medication taper instructions and relayed Dr. Rhea Belton message. They verbalized understanding and expressed appreciation for the call. All questions answered.

## 2022-02-01 NOTE — Telephone Encounter (Signed)
I am not convinced above-mentioned symptoms I have due to tapering off Keppra  She is currently taking lamotrigine 200 mg twice a day, last reported seizure was in 2015,  She has no needs for extra antiepileptic medications, polypharmacy can cause potential side effect, if she performed, she can choose to taper down Keppra at slower pace, she was on levetiracetam xr 500 mg 4 tablets every day, she may take 3 tablets every night for 1 week, 2 tablets every night for 1 week, then 1 tablets every night for 1 week  Then stop taking it

## 2022-02-01 NOTE — Telephone Encounter (Signed)
-----   Message from Marcial Pacas, MD sent at 01/31/2022  4:45 PM EST ----- Please call patient, x-ray of lumbar spine showed minimal disc degenerative changes at L1-L2.  There was no acute abnormality.

## 2022-02-01 NOTE — Telephone Encounter (Signed)
I spoke with the patient. She stated she was feeling unwell since discontinuing Keppra. She complains of constant cough, palpitations, nervousness, jittering, and headache. Reports feeling like she is going to have a "spell". She would like to continue on Keppra.  She is currently taking Lamotrigine 200 mg BID.

## 2022-02-01 NOTE — Telephone Encounter (Signed)
MR brain [facility] Josem Kaufmann: [id number] exp. 02/01/22-03/18/22   MR lumbar spine pending uploaded notes I will send both MRIs when it is approved

## 2022-02-01 NOTE — Telephone Encounter (Signed)
I spoke with the patient and informed her of the results. She verbalized understanding and expressed appreciation for the call. All questions answered.

## 2022-02-02 NOTE — Telephone Encounter (Signed)
Lumbar spine will not be approved until the patient completes 6 weeks of physical therapy. I am sending the MR brain to [facility] to be scheduled. [phone]

## 2022-02-13 NOTE — Telephone Encounter (Signed)
Correction, the MRI that was approved was sent to [facility] for scheduling. [phone]

## 2022-02-20 NOTE — Telephone Encounter (Signed)
Pt states her insurance will not approve the MRI, she is asking to cancel the 01-19 MRI

## 2022-02-21 NOTE — Telephone Encounter (Signed)
I called the patient and let her know that the MRI lumbar spine is the one that was denied. She is scheduled for a MRI brain which was approved so she is good to keep her appointment on Friday.

## 2022-02-22 ENCOUNTER — Other Ambulatory Visit: Payer: Medicaid Other | Admitting: *Deleted
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
## 2022-02-23 ENCOUNTER — Ambulatory Visit (HOSPITAL_COMMUNITY): Payer: Medicaid Other | Attending: Neurology | Admitting: Physical Therapy

## 2022-02-23 ENCOUNTER — Other Ambulatory Visit: Payer: Self-pay

## 2022-02-23 DIAGNOSIS — G40909 Epilepsy, unspecified, not intractable, without status epilepticus: Secondary | ICD-10-CM | POA: Diagnosis not present

## 2022-02-23 DIAGNOSIS — R269 Unspecified abnormalities of gait and mobility: Secondary | ICD-10-CM | POA: Diagnosis not present

## 2022-02-23 DIAGNOSIS — M5441 Lumbago with sciatica, right side: Secondary | ICD-10-CM | POA: Insufficient documentation

## 2022-02-23 DIAGNOSIS — M6281 Muscle weakness (generalized): Secondary | ICD-10-CM

## 2022-02-23 DIAGNOSIS — G8929 Other chronic pain: Secondary | ICD-10-CM | POA: Insufficient documentation

## 2022-02-23 DIAGNOSIS — R202 Paresthesia of skin: Secondary | ICD-10-CM | POA: Insufficient documentation

## 2022-02-23 DIAGNOSIS — M5459 Other low back pain: Secondary | ICD-10-CM | POA: Insufficient documentation

## 2022-02-24 ENCOUNTER — Ambulatory Visit (HOSPITAL_COMMUNITY): Admission: RE | Admit: 2022-02-24 | Payer: Medicaid Other | Source: Ambulatory Visit

## 2022-03-02 ENCOUNTER — Ambulatory Visit (HOSPITAL_COMMUNITY): Payer: Medicaid Other | Admitting: Physical Therapy

## 2022-03-02 DIAGNOSIS — M5459 Other low back pain: Secondary | ICD-10-CM | POA: Diagnosis not present

## 2022-03-02 DIAGNOSIS — M6281 Muscle weakness (generalized): Secondary | ICD-10-CM
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
## 2022-03-08 ENCOUNTER — Encounter (HOSPITAL_COMMUNITY): Payer: Self-pay

## 2022-03-08 ENCOUNTER — Ambulatory Visit (HOSPITAL_COMMUNITY): Payer: Medicaid Other

## 2022-03-09 ENCOUNTER — Ambulatory Visit: Payer: 59 | Admitting: Internal Medicine

## 2022-03-14 ENCOUNTER — Other Ambulatory Visit: Payer: Self-pay

## 2022-03-14 ENCOUNTER — Ambulatory Visit (INDEPENDENT_AMBULATORY_CARE_PROVIDER_SITE_OTHER): Payer: Medicaid Other | Admitting: Internal Medicine

## 2022-03-14 ENCOUNTER — Encounter: Payer: Self-pay | Admitting: Internal Medicine

## 2022-03-14 VITALS — BP 115/81 | HR 79 | Resp 16 | Ht 66.0 in | Wt 264.0 lb

## 2022-03-14 DIAGNOSIS — G9600 Cerebrospinal fluid leak, unspecified: Secondary | ICD-10-CM | POA: Diagnosis not present

## 2022-03-14 DIAGNOSIS — Z227 Latent tuberculosis: Secondary | ICD-10-CM | POA: Diagnosis not present

## 2022-03-14 DIAGNOSIS — Z23 Encounter for immunization: Secondary | ICD-10-CM | POA: Diagnosis not present

## 2022-03-14 NOTE — Addendum Note (Signed)
Addended by: Daisy Floro T on: 03/14/2022 02:27 PM   Modules accepted: Orders

## 2022-03-14 NOTE — Patient Instructions (Addendum)
Will check labs today for liver/blood production assessment  See me in around mid 05/2022 to retest one more time  We'll give you the better pneumococcal vaccine today. You don't need any more of it.
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
## 2022-03-15 ENCOUNTER — Encounter (HOSPITAL_COMMUNITY): Payer: Medicaid Other

## 2022-03-15 LAB — CBC
HCT: 37.8 % (ref 35.0–45.0)
Hemoglobin: 11.9 g/dL (ref 11.7–15.5)
MCH: 23.5 pg — ABNORMAL LOW (ref 27.0–33.0)
MCHC: 31.5 g/dL — ABNORMAL LOW (ref 32.0–36.0)
MCV: 74.6 fL — ABNORMAL LOW (ref 80.0–100.0)
MPV: 11.1 fL (ref 7.5–12.5)
Platelets: 285 10*3/uL (ref 140–400)
RBC: 5.07 10*6/uL (ref 3.80–5.10)
RDW: 15.7 % — ABNORMAL HIGH (ref 11.0–15.0)
WBC: 7.1 10*3/uL (ref 3.8–10.8)

## 2022-03-15 LAB — COMPLETE METABOLIC PANEL WITH GFR
AG Ratio: 1.3 (calc) (ref 1.0–2.5)
ALT: 39 U/L — ABNORMAL HIGH (ref 6–29)
AST: 31 U/L (ref 10–35)
Albumin: 4 g/dL (ref 3.6–5.1)
Alkaline phosphatase (APISO): 78 U/L (ref 37–153)
BUN/Creatinine Ratio: 17 (calc) (ref 6–22)
BUN: 20 mg/dL (ref 7–25)
CO2: 25 mmol/L (ref 20–32)
Calcium: 9.4 mg/dL (ref 8.6–10.4)
Chloride: 107 mmol/L (ref 98–110)
Creat: 1.19 mg/dL — ABNORMAL HIGH (ref 0.50–1.03)
Globulin: 3 g/dL (calc) (ref 1.9–3.7)
Glucose, Bld: 98 mg/dL (ref 65–99)
Potassium: 4.3 mmol/L (ref 3.5–5.3)
Sodium: 141 mmol/L (ref 135–146)
Total Bilirubin: 0.3 mg/dL (ref 0.2–1.2)
Total Protein: 7 g/dL (ref 6.1–8.1)
eGFR: 55 mL/min/{1.73_m2} — ABNORMAL LOW (ref >=60)

## 2022-03-16 ENCOUNTER — Ambulatory Visit (HOSPITAL_COMMUNITY)
Admission: RE | Admit: 2022-03-16 | Discharge: 2022-03-16 | Disposition: A | Discharge status: Home / Self Care | Payer: Medicaid Other | Source: Ambulatory Visit | Attending: Neurology | Admitting: Neurology

## 2022-03-16 DIAGNOSIS — G8929 Other chronic pain: Secondary | ICD-10-CM | POA: Diagnosis present

## 2022-03-16 DIAGNOSIS — M5441 Lumbago with sciatica, right side: Secondary | ICD-10-CM | POA: Diagnosis present

## 2022-03-16 DIAGNOSIS — G40909 Epilepsy, unspecified, not intractable, without status epilepticus: Secondary | ICD-10-CM | POA: Diagnosis present

## 2022-03-16 DIAGNOSIS — R202 Paresthesia of skin: Secondary | ICD-10-CM | POA: Diagnosis present

## 2022-03-16 MED ORDER — GADOBUTROL 1 MMOL/ML IV SOLN
10.0000 mL | Freq: Once | INTRAVENOUS | Status: AC | PRN
  Administered 2022-03-16: 10 mL via INTRAVENOUS

## 2022-03-17 ENCOUNTER — Ambulatory Visit (HOSPITAL_COMMUNITY): Payer: Medicaid Other | Attending: Neurology

## 2022-03-17 DIAGNOSIS — M6281 Muscle weakness (generalized): Secondary | ICD-10-CM | POA: Diagnosis present

## 2022-03-17 DIAGNOSIS — M5459 Other low back pain: Secondary | ICD-10-CM | POA: Insufficient documentation
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
## 2022-03-20 ENCOUNTER — Telehealth: Payer: Self-pay | Admitting: Neurology

## 2022-03-20 NOTE — Telephone Encounter (Signed)
Pt is calling. Stated she went to a ENT office and she want Dr. Krista Blue to look over the visit. Said she asked them to send records over for her to see.Marland Kitchen

## 2022-03-21 ENCOUNTER — Telehealth: Payer: Self-pay

## 2022-03-21 NOTE — Telephone Encounter (Signed)
-----   Message from Marcial Pacas, MD sent at 03/20/2022  2:01 PM EST ----- Please call patient, MRI of the brain showed incidental findings of partial empty sella, otherwise showed no significant intracranial abnormalities.

## 2022-03-21 NOTE — Telephone Encounter (Signed)
Called and spoke to patient about results. Pt verbalized understanding. Pt had no questions at this time but was encouraged to call back if questions arise.

## 2022-03-22 ENCOUNTER — Ambulatory Visit (HOSPITAL_COMMUNITY): Payer: Medicaid Other

## 2022-03-22 DIAGNOSIS — M6281 Muscle weakness (generalized): Secondary | ICD-10-CM

## 2022-03-22 DIAGNOSIS — M5459 Other low back pain: Secondary | ICD-10-CM
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
## 2022-03-24 ENCOUNTER — Ambulatory Visit (HOSPITAL_COMMUNITY): Payer: Medicaid Other

## 2022-03-24 DIAGNOSIS — M5459 Other low back pain: Secondary | ICD-10-CM | POA: Diagnosis not present

## 2022-03-24 DIAGNOSIS — M6281 Muscle weakness (generalized): Secondary | ICD-10-CM
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
## 2022-03-28 ENCOUNTER — Ambulatory Visit (HOSPITAL_COMMUNITY): Payer: Medicaid Other

## 2022-03-28 DIAGNOSIS — M5459 Other low back pain: Secondary | ICD-10-CM
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
## 2022-03-30 ENCOUNTER — Encounter (HOSPITAL_COMMUNITY): Payer: Medicaid Other

## 2022-03-31 ENCOUNTER — Telehealth: Payer: Self-pay | Admitting: *Deleted

## 2022-03-31 DIAGNOSIS — J310 Chronic rhinitis: Secondary | ICD-10-CM

## 2022-03-31 DIAGNOSIS — G9601 Cranial cerebrospinal fluid leak, spontaneous: Secondary | ICD-10-CM

## 2022-03-31 NOTE — Telephone Encounter (Signed)
patient wanted to inform Dr Halford Chessman she went to ENT but discussions with surgery were mentioned. Patient wants to speak with nurse about options due to insurance. Please call and advise patient.

## 2022-04-03 ENCOUNTER — Other Ambulatory Visit: Payer: Self-pay

## 2022-04-03 MED ORDER — LAMOTRIGINE 200 MG PO TABS: 200.0000 mg | ORAL_TABLET | Freq: Two times a day (BID) | ORAL | 3 refills | Status: DC

## 2022-04-03 NOTE — Telephone Encounter (Signed)
Pt said need a refill for seizure medication, but do not know the name of the seizure medication.

## 2022-04-03 NOTE — Telephone Encounter (Signed)
Called and spoke with patient and she states that she saw a provider with [facility] and they wanted to refer her to a neck and spine surgeon with [facility]. She states her insurance does not cover the place they sent the referral to. She states she has contacted [facility] and asked multiple times for the referral to be sent somewhere else and has had no luck. She wants Dr. Collie Siad opinion of what was discussed at her ENT appt  and if she really needs to see another specialist and also wants to know if we can send the referral somewhere else for her,   Dr. Halford Chessman please advise, if we get the patients records from visit with ENT are you willing to look over them to give your opinion to patient?

## 2022-04-03 NOTE — Telephone Encounter (Signed)
Spoke with pt and reviewed information from [facility].  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  Unfortunately ENT at [facility] is out of network with her insurance.  Will put in referral to ENT at [facility].  If this doesn't work, then will try at [facility].

## 2022-05-16 ENCOUNTER — Ambulatory Visit: Payer: Medicaid Other | Admitting: Internal Medicine

## 2022-05-30 ENCOUNTER — Other Ambulatory Visit: Payer: Self-pay

## 2022-05-30 ENCOUNTER — Ambulatory Visit (INDEPENDENT_AMBULATORY_CARE_PROVIDER_SITE_OTHER): Payer: Medicaid Other | Admitting: Internal Medicine

## 2022-05-30 ENCOUNTER — Encounter: Payer: Self-pay | Admitting: Internal Medicine

## 2022-05-30 VITALS — BP 110/76 | HR 88 | Resp 16 | Ht 66.0 in | Wt 269.0 lb

## 2022-05-30 DIAGNOSIS — Z227 Latent tuberculosis: Secondary | ICD-10-CM | POA: Diagnosis present

## 2022-05-30 NOTE — Patient Instructions (Signed)
You had finished 6 months of treatment (isoniazid) for your latent TB  You will always have a positive blood test for TB, but there is no further need to retreat latent tb    The risk of active tb remains there but very small If active tb develops, some of the signs would be chronic fever, weight loss, and if in the lung would be chronic cough
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
## 2022-05-31 ENCOUNTER — Ambulatory Visit (INDEPENDENT_AMBULATORY_CARE_PROVIDER_SITE_OTHER): Payer: Medicaid Other | Admitting: Neurology

## 2022-05-31 ENCOUNTER — Encounter: Payer: Self-pay | Admitting: Neurology

## 2022-05-31 VITALS — BP 115/81 | HR 83 | Ht 66.0 in | Wt 268.0 lb

## 2022-05-31 DIAGNOSIS — R269 Unspecified abnormalities of gait and mobility: Secondary | ICD-10-CM

## 2022-05-31 DIAGNOSIS — G40909 Epilepsy, unspecified, not intractable, without status epilepticus: Secondary | ICD-10-CM

## 2022-05-31 DIAGNOSIS — G9600 Cerebrospinal fluid leak, unspecified: Secondary | ICD-10-CM

## 2022-05-31 NOTE — Patient Instructions (Signed)
Stay on the Lamictal for seizures Order LP  Call ENT to let them know you haven't heard from neurosurgery. This is very important.

## 2022-05-31 NOTE — Progress Notes (Addendum)
Patient: Jennifer [name] Date of Birth: April 06, 1970  Reason for Visit: Follow up History from: Patient, husband Primary Neurologist: Terrace Arabia  ASSESSMENT AND PLAN 52 y.o. year old female   1.  History of seizure 2.  History of headaches 3.  Chronic low back pain 4.  Gait abnormality 5.  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 Terrace Arabia, order LP with opening pressure, corresponding fluid samples, continues with CSF leak from left nostril  -She will contact ENT to follow-up on referral to neurosurgery at [facility] for evaluation of CSF leak, discussed importance of this  -Fortunately, seizures not problematic, will continue Lamictal 200 mg twice a day -Go to the ER for any acute change in her condition -Follow up in about 4 months with Dr. Terrace Arabia   Orders Placed This Encounter  Procedures   DG FL GUIDED LUMBAR PUNCTURE   Addendum: Lumbar puncture Jun 14, 2022, opening pressure 28 cmH2O,  HISTORY  Jennifer [name] is a 52 year old female, accompanied by her husband, seen in request by primary care nurse practitioner Bucio, Julian Reil, to continue her neurological care at our clinic, initial evaluation was on January 26, 2022, she was previously seen by [facility] neurologist Dr. Gerilyn Pilgrim, who has closed his neurological practice   I reviewed and summarized the referring note. PMHX, extensive records from Dr.Doonquah's office Bipolar, on polypharmacy Ovarian Cancer was treated in 2000,  DM HTN HLD PTSD  History of substance abuse, cocaine in young, quit for many years. Smoke,   She had seizure, 2015, frequent seizure spells with a couple years of span, in the setting of extreme stress, preceding by headache, confusion, feeling nervous, mild bilateral hands tremor, then would involve into whole-body tremor, sometimes with bowel incontinence, lasting 10 to 15 minutes, she was treated on Keppra slow titrating  dose now 750 mg 4 tablets every night, does complains of side effect, also feeling anxious   Last reported seizure activity in 2015   She carries a diagnosis of bipolar disorder, PTSD, on polypharmacy for her mood disorder, this include lamotrigine 200 mg every night, Lexapro 5 mg daily, Seroquel 200 mg every night   She also has a history of chronic low back pain, frequent flareup, most recent flareup was few weeks ago, she get up after bending over to pick up her telephone cord, she felt her lower back has shifted, sharp radiating pain, she could not move for few minutes, then was able to walk, ever since then, she complains of worsening right-sided low back pain, radiating pain to right hip, gait abnormality, worsening pain bearing weight   She had long history of diabetes, no complaints of bilateral feet numbness tingling, urinary urgency, frequency,   She also complains of worsening headache, retro-orbital area, sometimes occipital region, shooting pain from front to back  Update May 31, 2022 SS: X-ray lumbar spine showed minimal disc degenerative changes at L1-L2.  MRI of the brain showed incidental findings of partial empty sella.  At last visit Dr. Terrace Arabia stopped Keppra due to side effects, increased Lamictal 200 milligrams twice daily.  Apparently presented a few months ago with continuous nasal drip, beta-2 transferrin confirm CSF leak to the left nasal cavity, felt likely due to [facility] in the setting of partial empty sella on MRI per ENT 05/15/22.  Referred to neurosurgery, has not heard. No seizures, remains on Lamictal 200 mg twice daily. Has brief  shark jabs to her head, left ear. 05/18/22 [facility], R/L no optic nerve edema.   REVIEW OF SYSTEMS: Out of a complete 14 system review of symptoms, the patient complains only of the following symptoms, and all other reviewed systems are negative.  See HPI  ALLERGIES: Allergies  Allergen Reactions   Metrizamide Rash and Swelling    Hands  swelling/rash   Shellfish Allergy Swelling    Swells tongue   Amoxapine Nausea Only   Amoxicillin Other (See Comments)    WHAT IS THE REACTION?   Other Itching and Swelling    Shingles vaccine   Penicillin G Other (See Comments)    WHAT IS THE REACTION?   Codeine Itching   Hydrocodone Itching and Rash   Ivp Dye [Iodinated Contrast Media] Rash    Hands swelling/rash   Latex Rash    HOME MEDICATIONS: Outpatient Medications Prior to Visit  Medication Sig Dispense Refill   albuterol (PROVENTIL) (2.5 MG/3ML) 0.083% nebulizer solution Take 3 mLs (2.5 mg total) by nebulization every 6 (six) hours as needed for wheezing or shortness of breath. 180 mL 5   albuterol (VENTOLIN HFA) 108 (90 Base) MCG/ACT inhaler Inhale 2 puffs into the lungs every 6 (six) hours as needed for wheezing or shortness of breath. 8 g 5   cetirizine (ZYRTEC) 10 MG tablet Take 1 tablet (10 mg total) by mouth every morning. 90 tablet 3   cyclobenzaprine (FLEXERIL) 10 MG tablet Take 10 mg by mouth at bedtime as needed for muscle spasms.     EPINEPHrine 0.15 MG/0.15ML IJ injection Inject 0.15 mg into the muscle as needed for anaphylaxis.     escitalopram (LEXAPRO) 5 MG tablet Take 5 mg by mouth daily.     lamoTRIgine (LAMICTAL) 200 MG tablet Take 1 tablet (200 mg total) by mouth 2 (two) times daily. 60 tablet 3   linaCLOtide (LINZESS PO) Take by mouth.     metformin (FORTAMET) 500 MG (OSM) 24 hr tablet Take 500 mg by mouth daily with breakfast.     montelukast (SINGULAIR) 10 MG tablet TAKE (1) TABLET BY MOUTH DAILY AT BEDTIME. 28 tablet 11   ondansetron (ZOFRAN) 4 MG tablet Take 1 tablet (4 mg total) by mouth every 8 (eight) hours as needed for nausea or vomiting. 60 tablet 3   pantoprazole (PROTONIX) 40 MG tablet TAKE 1 TABLET BY MOUTH TWICE DAILY BEFORE MEALS. 60 tablet 3   QUEtiapine (SEROQUEL) 200 MG tablet Take 200 mg by mouth at bedtime.     traMADol (ULTRAM) 50 MG tablet Take 50 mg by mouth 3 (three) times daily as  needed for moderate pain.     Vitamin D, Ergocalciferol, (DRISDOL) 1.25 MG (50000 UT) CAPS capsule Take 1 capsule (50,000 Units total) by mouth every 7 (seven) days. 5 capsule 11   fluticasone (FLONASE) 50 MCG/ACT nasal spray Place 1 spray into both nostrils daily. (Patient not taking: Reported on 05/31/2022) 16 g 11   ipratropium (ATROVENT) 0.03 % nasal spray Place 2 sprays into both nostrils 3 (three) times daily as needed for rhinitis. (Patient not taking: Reported on 05/30/2022) 30 mL 12   isoniazid (NYDRAZID) 300 MG tablet Take 1 tablet (300 mg total) by mouth daily. (Patient taking differently: Take 200 mg by mouth daily. Twice daily) 30 tablet 8   mometasone-formoterol (DULERA) 100-5 MCG/ACT AERO Inhale 2 puffs into the lungs 2 (two) times daily. (Patient not taking: Reported on 05/31/2022) 1 each 5   pyridOXINE (VITAMIN B6) 25  MG tablet Take 1 tablet (25 mg total) by mouth daily. (Patient not taking: Reported on 05/31/2022) 30 tablet 8   No facility-administered medications prior to visit.    PAST MEDICAL HISTORY: Past Medical History:  Diagnosis Date   Allergy    Anemia    Anxiety    Arthritis    neck   Arthritis    Asthma    Chronic kidney disease, stage 2 (mild) 01/21/2020   Diabetes mellitus without complication    GERD (gastroesophageal reflux disease)    History of flexible sigmoidoscopy 1999   Normal to 60cm,   Hypertension    Neuromuscular disorder    Ovarian cancer    skin cancer   Panic attack    PTSD (post-traumatic stress disorder)    S/P endoscopy 03/2010   Nl, s/p 56-French Maloney dilator, biopsy benign   S/P endoscopy 2008   Mild gastritis   Seizures    Sleep apnea    Substance abuse    drug addiction    PAST SURGICAL HISTORY: Past Surgical History:  Procedure Laterality Date   ABDOMINAL HYSTERECTOMY     ovarian tumors   CHOLECYSTECTOMY     COLONOSCOPY  03/01/2011   Rourk-External hemorrhoids/otherwise normal rectum and colon.   COLONOSCOPY WITH  PROPOFOL N/A 08/08/2018   entire examined colon is normal. The distal rectum and anal verge are normal on retroflexion view. Repeat in 5 years.    ESOPHAGOGASTRODUODENOSCOPY  2008   gastritis   ESOPHAGOGASTRODUODENOSCOPY  2012   Moderate sized hiatal hernia, non-H. pylori gastritis   ESOPHAGOGASTRODUODENOSCOPY N/A 11/17/2013   RMR: Mild erosive reflux esophagitis. Patulous EG junction   ESOPHAGOGASTRODUODENOSCOPY (EGD) WITH PROPOFOL N/A 08/08/2018   Erosive reflux esophagitis. Dilated. Small hiatal hernia. Otherwise normal.    ESOPHAGOGASTRODUODENOSCOPY (EGD) WITH PROPOFOL N/A 06/02/2021   Procedure: ESOPHAGOGASTRODUODENOSCOPY (EGD) WITH PROPOFOL;  Surgeon: Corbin Ade, MD;  Location: [facility];  Service: Endoscopy;  Laterality: N/A;  2:00pm   HERNIA REPAIR     Incisional with mesh   KNEE ARTHROSCOPY     left knee surgery in [place]   MALONEY DILATION  08/08/2018   Procedure: MALONEY DILATION;  Surgeon: Corbin Ade, MD;  Location: [facility];  Service: Endoscopy;;   MALONEY DILATION N/A 06/02/2021   Procedure: Alvy Beal;  Surgeon: Corbin Ade, MD;  Location: [facility];  Service: Endoscopy;  Laterality: N/A;   PARTIAL HYSTERECTOMY     SKIN CANCER EXCISION     left bicep   TONSILLECTOMY      FAMILY HISTORY: Family History  Problem Relation Age of Onset   Crohn's disease Maternal Aunt    Breast cancer Maternal Grandmother    Cancer Maternal Grandmother    Colon cancer Maternal Grandfather        78   Pancreatic cancer Maternal Grandfather    Cancer Maternal Grandfather    Crohn's disease Cousin        x 2 cousins   Breast cancer Daughter 32   Arthritis Mother    COPD Mother    Depression Mother    Drug abuse Mother    Heart disease Mother    Hypertension Mother    Hyperlipidemia Mother    Learning disabilities Mother    Mental illness Mother    Alcohol abuse Mother    Colon polyps Mother 54       tubular adenoma   Early death Father 49        murder  Alcohol abuse Father    Drug abuse Father    Learning disabilities Father    Cancer Paternal Grandmother    Alcohol abuse Paternal Grandmother    Cancer Paternal Grandfather    Alcohol abuse Maternal Uncle    Alcohol abuse Paternal Uncle    Drug abuse Paternal Uncle     SOCIAL HISTORY: Social History   Socioeconomic History   Marital status: Married    Spouse name: Chrissie Noa   Number of children: Not on file   Years of education: Not on file   Highest education level: 11th grade  Occupational History   Occupation: disabled    Associate Professor: NOT EMPLOYED  Tobacco Use   Smoking status: Every Day    Packs/day: 0.25    Years: 26.00    Additional pack years: 0.00    Total pack years: 6.50    Types: Cigarettes    Start date: 05/03/1984    Last attempt to quit: 03/13/2022    Years since quitting: 0.2    Passive exposure: Current   Smokeless tobacco: Never   Tobacco comments:    Patient states she is smoking based off her mood.   Vaping Use   Vaping Use: Never used  Substance and Sexual Activity   Alcohol use: Not Currently   Drug use: Not Currently    Types: Marijuana   Sexual activity: Not Currently    Birth control/protection: Surgical  Other Topics Concern   Not on file  Social History Narrative   Lives at home with her husband.   Right handed.    1-2 cups of coffee 3 times per wk.    Intermittent soda.   Started back smoking   Social Determinants of Health   Financial Resource Strain: Low Risk  (05/28/2019)   Overall Financial Resource Strain (CARDIA)    Difficulty of Paying Living Expenses: Not hard at all  Food Insecurity: No Food Insecurity (05/28/2019)   Hunger Vital Sign    Worried About Running Out of Food in the Last Year: Never true    Ran Out of Food in the Last Year: Never true  Transportation Needs: No Transportation Needs (05/28/2019)   PRAPARE - Administrator, Civil Service (Medical): No    Lack of Transportation (Non-Medical): No   Physical Activity: Inactive (05/28/2019)   Exercise Vital Sign    Days of Exercise per Week: 0 days    Minutes of Exercise per Session: 0 min  Stress: Stress Concern Present (05/28/2019)   [employer] of Occupational Health - Occupational Stress Questionnaire    Feeling of Stress : To some extent  Social Connections: Moderately Integrated (05/28/2019)   Social Connection and Isolation Panel [NHANES]    Frequency of Communication with Friends and Family: More than three times a week    Frequency of Social Gatherings with Friends and Family: More than three times a week    Attends Religious Services: 1 to 4 times per year    Active Member of [employer] or Organizations: No    Attends Banker Meetings: Never    Marital Status: Married  Catering manager Violence: Not At Risk (05/28/2019)   Humiliation, Afraid, Rape, and Kick questionnaire    Fear of Current or Ex-Partner: No    Emotionally Abused: No    Physically Abused: No    Sexually Abused: No   PHYSICAL EXAM  Vitals:   05/31/22 1102  BP: 115/81  Pulse: 83  Weight: 268 lb (121.6 kg)  Height: 5\' 6"  (1.676 m)   Body mass index is 43.26 kg/m.  Generalized: Well developed, in no acute distress  Neurological examination  Mentation: Alert oriented to time, place, history taking. Follows all commands speech and language fluent Cranial nerve II-XII: Pupils were equal round reactive to light. Extraocular movements were full, visual field were full on confrontational test. Facial sensation and strength were normal. Head turning and shoulder shrug  were normal and symmetric. Motor: The motor testing reveals 5 over 5 strength of all 4 extremities. Good symmetric motor tone is noted throughout.  Sensory: Sensory testing is intact to soft touch on all 4 extremities. No evidence of extinction is noted.  Coordination: Cerebellar testing reveals good finger-nose-finger and heel-to-shin bilaterally.  Gait and station: Gait is  steady, but husband walks with her for support Reflexes: Deep tendon reflexes are symmetric and normal bilaterally.   DIAGNOSTIC DATA (LABS, IMAGING, TESTING) - I reviewed patient records, labs, notes, testing and imaging myself where available.  Lab Results  Component Value Date   WBC 7.1 03/14/2022   HGB 11.9 03/14/2022   HCT 37.8 03/14/2022   MCV 74.6 (L) 03/14/2022   PLT 285 03/14/2022      Component Value Date/Time   NA 141 03/14/2022 0217   NA 140 02/07/2018 1011   K 4.3 03/14/2022 0217   K 4.1 02/23/2012 0000   CL 107 03/14/2022 0217   CO2 25 03/14/2022 0217   GLUCOSE 98 03/14/2022 0217   BUN 20 03/14/2022 0217   BUN 13 02/07/2018 1011   CREATININE 1.19 (H) 03/14/2022 0217   CALCIUM 9.4 03/14/2022 0217   CALCIUM 8.9 02/23/2012 0000   PROT 7.0 03/14/2022 0217   PROT 7.2 04/16/2018 1153   ALBUMIN 3.9 06/01/2020 1224   ALBUMIN 4.1 04/16/2018 1153   AST 31 03/14/2022 0217   AST 13 02/23/2012 0000   ALT 39 (H) 03/14/2022 0217   ALKPHOS 69 06/01/2020 1224   ALKPHOS 71 02/23/2012 0000   BILITOT 0.3 03/14/2022 0217   BILITOT 0.3 04/16/2018 1153   BILITOT 0.1 02/23/2012 0000   GFRNONAA >60 06/01/2020 1224   GFRNONAA 61 10/12/2016 1517   GFRAA 57 (L) 02/07/2018 1011   GFRAA 71 10/12/2016 1517   Lab Results  Component Value Date   CHOL 172 02/07/2018   HDL 49 02/07/2018   LDLCALC 108 (H) 02/07/2018   TRIG 74 02/07/2018   CHOLHDL 3.5 02/07/2018   Lab Results  Component Value Date   HGBA1C 5.4 02/07/2018   Lab Results  Component Value Date   VITAMINB12 321 04/11/2018   Lab Results  Component Value Date   TSH 1.750 02/07/2018    Margie Ege, AGNP-C, DNP 05/31/2022, 12:18 PM [facility] [address], [address] [place], [place] [address] [phone]

## 2022-06-05 ENCOUNTER — Ambulatory Visit (INDEPENDENT_AMBULATORY_CARE_PROVIDER_SITE_OTHER): Payer: Medicaid Other | Admitting: Pulmonary Disease

## 2022-06-05 ENCOUNTER — Encounter: Payer: Self-pay | Admitting: Pulmonary Disease

## 2022-06-05 VITALS — BP 108/72 | HR 93 | Ht 66.0 in | Wt 265.4 lb

## 2022-06-05 DIAGNOSIS — G9601 Cranial cerebrospinal fluid leak, spontaneous: Secondary | ICD-10-CM

## 2022-06-05 DIAGNOSIS — J310 Chronic rhinitis: Secondary | ICD-10-CM

## 2022-06-05 DIAGNOSIS — R0609 Other forms of dyspnea: Secondary | ICD-10-CM | POA: Diagnosis not present

## 2022-06-05 DIAGNOSIS — G4733 Obstructive sleep apnea (adult) (pediatric): Secondary | ICD-10-CM

## 2022-06-05 DIAGNOSIS — J4540 Moderate persistent asthma, uncomplicated: Secondary | ICD-10-CM

## 2022-06-05 NOTE — Progress Notes (Signed)
[facility]  Chief Complaint  Patient presents with   Follow-up    Constitutional:  BP 108/72   Pulse 93   Ht 5\' 6"  (1.676 m)   Wt 265 lb 6.4 oz (120.4 kg)   SpO2 96%   BMI 42.84 kg/m   Past Medical History:  HTN, CKD, DM 2, GERD, Ovarian cancer, PSTD, Seizurses, Substance abuse, Anxiety, Allergies, OA, RA, positive ANA, positive Quantiferon gold, Fibromyalgia  Past Surgical History:  She  has a past surgical history that includes Partial hysterectomy; Cholecystectomy; Tonsillectomy; Hernia repair; Colonoscopy (03/01/2011); Esophagogastroduodenoscopy (2008); Esophagogastroduodenoscopy (2012); Esophagogastroduodenoscopy (N/A, 11/17/2013); Skin cancer excision; Abdominal hysterectomy; Colonoscopy with propofol (N/A, 08/08/2018); Esophagogastroduodenoscopy (egd) with propofol (N/A, 08/08/2018); maloney dilation (08/08/2018); Knee arthroscopy; Esophagogastroduodenoscopy (egd) with propofol (N/A, 06/02/2021); and maloney dilation (N/A, 06/02/2021).  Brief Summary:  Jennifer [name] is a 52 y.o. female former smoker with asthma, and obstructive sleep apnea.      Subjective:   Seen by ENT at [facility].  Has upcoming appointment with neurosurgery.  Still has fluid leaking from her nose.  She gets pressure around her sinuses.  Has floaters in her eyes.  Has eye doctor appointment scheduled.  Gets numb in her face sometimes, and last about a minute.  Not associated with other symptoms.  Not have cough, sputum, or wheeze.  Gets winded while walking.  Feels better after resting a few minutes.  Not having leg swelling.  Physical Exam:   Appearance - well kempt   ENMT - no sinus tenderness, no oral exudate, no LAN, Mallampati 3 airway, no stridor  Respiratory - equal breath sounds bilaterally, no wheezing or rales  CV - s1s2 regular rate and rhythm, no murmurs  Ext - no clubbing, no edema  Skin - no rashes  Psych - normal mood and affect       Pulmonary testing:  PFT 06/29/20 >> FEV1 2.52 (99%), FEV1% 85, TLC 4.87 (90%), DLCO 95%, +BD  Sleep Tests:  PSG 04/29/13 >> AHI 0.4, SpO2 low 89% HST 09/02/20 >> AHI 7.8, SpO2 low 85%  Social History:  She  reports that she has been smoking cigarettes. She started smoking about 38 years ago. She has a 6.50 pack-year smoking history. She has been exposed to tobacco smoke. She has never used smokeless tobacco. She reports that she does not currently use alcohol. She reports that she does not currently use drugs after having used the following drugs: Marijuana.  Family History:  Her family history includes Alcohol abuse in her father, maternal uncle, mother, paternal grandmother, and paternal uncle; Arthritis in her mother; Breast cancer in her maternal grandmother; Breast cancer (age of onset: 41) in her daughter; COPD in her mother; Cancer in her maternal grandfather, maternal grandmother, paternal grandfather, and paternal grandmother; Colon cancer in her maternal grandfather; Colon polyps (age of onset: 22) in her mother; Crohn's disease in her cousin and maternal aunt; Depression in her mother; Drug abuse in her father, mother, and paternal uncle; Early death (age of onset: 57) in her father; Heart disease in her mother; Hyperlipidemia in her mother; Hypertension in her mother; Learning disabilities in her father and mother; Mental illness in her mother; Pancreatic cancer in her maternal grandfather.     Assessment/Plan:   Dyspnea on exertion. - will get chest xray today  Allergic asthma. - continue dulera 100 two puffs bid - prn albuterol  Chronic rhinitis with post nasal drip from CSF leak. - seen by Dr.  Ishmael Holter with ENT at [facility] - she will have consultation with neurosurgery at [facility], and the coordinate with ENT about surgical options - advised her to stop using her nose sprays  Obstructive sleep apnea. - intolerant of CPAP - she will follow up with Dr. Irene Limbo  after she is treated for CSF leak  Latent TB. - completed therapy with IN[facility]  Hx of seizures, chronic headache, Partial empty sell with concern for idiopathic intracranial hypertension. - followed by Dr. Terrace Arabia with [facility] - LP with IR ordered  Social determinants of health. - she has limit reading skills that can impact her ability to access health care   Time Spent Involved in Patient Care on Day of Examination:  36 minutes  Follow up:   Patient Instructions  C[facility] at [facility] later this week.  Dulera two puffs in the morning and two puffs in the evening.  Follow up in 4 months.  Medication List:   Allergies as of 06/05/2022       Reactions   Metrizamide Rash, Swelling   Hands swelling/rash   Shellfish Allergy Swelling   Swells tongue   Amoxapine Nausea Only   Amoxicillin Other (See Comments)   WHAT IS THE REACTION?   Other Itching, Swelling   Shingles vaccine   Penicillin G Other (See Comments)   WHAT IS THE REACTION?   Codeine Itching   Hydrocodone Itching, Rash   Ivp Dye [iodinated Contrast Media] Rash   Hands swelling/rash   Latex Rash        Medication List        Accurate as of June 05, 2022  3:00 PM. If you have any questions, ask your nurse or doctor.          STOP taking these medications    fluticasone 50 MCG/ACT nasal spray Commonly known as: FLONASE Stopped by: Coralyn Helling, MD   ipratropium 0.03 % nasal spray Commonly known as: ATROVENT Stopped by: Coralyn Helling, MD   isoniazid 300 MG tablet Commonly known as: NYDRAZID Stopped by: Coralyn Helling, MD   pyridOXINE 25 MG tablet Commonly known as: VITAMIN B6 Stopped by: Coralyn Helling, MD       TAKE these medications    albuterol 108 (90 Base) MCG/ACT inhaler Commonly known as: VENTOLIN HFA Inhale 2 puffs into the lungs every 6 (six) hours as needed for wheezing or shortness of breath.   albuterol (2.5 MG/3ML) 0.083% nebulizer solution Commonly known as:  PROVENTIL Take 3 mLs (2.5 mg total) by nebulization every 6 (six) hours as needed for wheezing or shortness of breath.   cetirizine 10 MG tablet Commonly known as: ZYRTEC Take 1 tablet (10 mg total) by mouth every morning.   cyclobenzaprine 10 MG tablet Commonly known as: FLEXERIL Take 10 mg by mouth at bedtime as needed for muscle spasms.   EPINEPHrine 0.15 MG/0.15ML injection Commonly known as: ADRENACLICK Inject 0.15 mg into the muscle as needed for anaphylaxis.   escitalopram 5 MG tablet Commonly known as: LEXAPRO Take 5 mg by mouth daily.   lamoTRIgine 200 MG tablet Commonly known as: LAMICTAL Take 1 tablet (200 mg total) by mouth 2 (two) times daily.   LINZESS PO Take by mouth.   metformin 500 MG (OSM) 24 hr tablet Commonly known as: FORTAMET Take 500 mg by mouth daily with breakfast.   mometasone-formoterol 100-5 MCG/ACT Aero Commonly known as: DULERA Inhale 2 puffs into the lungs 2 (two) times daily.  montelukast 10 MG tablet Commonly known as: SINGULAIR TAKE (1) TABLET BY MOUTH DAILY AT BEDTIME.   ondansetron 4 MG tablet Commonly known as: ZOFRAN Take 1 tablet (4 mg total) by mouth every 8 (eight) hours as needed for nausea or vomiting.   pantoprazole 40 MG tablet Commonly known as: PROTONIX TAKE 1 TABLET BY MOUTH TWICE DAILY BEFORE MEALS.   QUEtiapine 200 MG tablet Commonly known as: SEROQUEL Take 200 mg by mouth at bedtime.   traMADol 50 MG tablet Commonly known as: ULTRAM Take 50 mg by mouth 3 (three) times daily as needed for moderate pain.   Vitamin D (Ergocalciferol) 1.25 MG (50000 UNIT) Caps capsule Commonly known as: DRISDOL Take 1 capsule (50,000 Units total) by mouth every 7 (seven) days.        Signature:  Coralyn Helling, MD [facility] Pulmonary/Critical Care Pager - [phone] 06/05/2022, 3:00 PM

## 2022-06-05 NOTE — Patient Instructions (Signed)
Chest xray at [facility] later this week.  Dulera two puffs in the morning and two puffs in the evening.  Follow up in 4 months.

## 2022-06-09 ENCOUNTER — Ambulatory Visit (HOSPITAL_COMMUNITY)
Admission: RE | Admit: 2022-06-09 | Discharge: 2022-06-09 | Disposition: A | Discharge status: Home / Self Care | Payer: Medicaid Other | Source: Ambulatory Visit | Attending: Pulmonary Disease | Admitting: Pulmonary Disease

## 2022-06-09 DIAGNOSIS — R0609 Other forms of dyspnea: Secondary | ICD-10-CM | POA: Diagnosis present

## 2022-06-11 ENCOUNTER — Other Ambulatory Visit: Payer: Self-pay | Admitting: Pulmonary Disease

## 2022-06-14 ENCOUNTER — Ambulatory Visit
Admission: RE | Admit: 2022-06-14 | Discharge: 2022-06-14 | Disposition: A | Discharge status: Home / Self Care | Payer: Medicaid Other | Source: Ambulatory Visit | Attending: Neurology | Admitting: Neurology

## 2022-06-14 VITALS — BP 128/75 | HR 70

## 2022-06-14 DIAGNOSIS — G9600 Cerebrospinal fluid leak, unspecified: Secondary | ICD-10-CM

## 2022-06-14 LAB — TIQ-MISC

## 2022-06-14 LAB — GRAM STAIN: MICRO NUMBER:: 14929680

## 2022-06-14 LAB — CSF CELL COUNT WITH DIFFERENTIAL

## 2022-06-14 NOTE — Discharge Instructions (Signed)

## 2022-06-15 ENCOUNTER — Ambulatory Visit: Payer: Medicaid Other | Admitting: Internal Medicine

## 2022-06-16 LAB — FUNGUS CULTURE W SMEAR: MICRO NUMBER:: 14929681

## 2022-06-16 LAB — PROTEIN, CSF: Total Protein, CSF: 31 mg/dL (ref 15–45)

## 2022-06-18 LAB — CSF CELL COUNT WITH DIFFERENTIAL

## 2022-06-18 LAB — FUNGUS CULTURE W SMEAR: SMEAR:: NONE SEEN

## 2022-06-19 LAB — FUNGUS CULTURE W SMEAR

## 2022-06-19 LAB — GLUCOSE, CSF: Glucose, CSF: 57 mg/dL (ref 40–80)

## 2022-06-19 LAB — GRAM STAIN

## 2022-07-12 ENCOUNTER — Other Ambulatory Visit: Payer: Self-pay | Admitting: Internal Medicine

## 2022-07-14 LAB — CSF CELL COUNT WITH DIFFERENTIAL
RBC Count, CSF: 0 cells/uL
TOTAL NUCLEATED CELL: 1 cells/uL (ref 0–5)

## 2022-07-14 LAB — FUNGUS CULTURE W SMEAR: SPECIMEN QUALITY:: ADEQUATE

## 2022-07-14 LAB — GRAM STAIN: SPECIMEN QUALITY:: ADEQUATE

## 2022-07-14 LAB — VDRL, CSF: VDRL Quant, CSF: NONREACTIVE

## 2022-09-12 ENCOUNTER — Encounter: Payer: Self-pay | Admitting: *Deleted

## 2022-10-03 ENCOUNTER — Ambulatory Visit: Payer: Medicaid Other | Admitting: Neurology

## 2022-10-14 ENCOUNTER — Other Ambulatory Visit: Payer: Self-pay | Admitting: Internal Medicine

## 2022-10-17 ENCOUNTER — Ambulatory Visit: Payer: Medicaid Other | Admitting: Internal Medicine

## 2022-10-22 NOTE — Progress Notes (Unsigned)
GI Office Note    Referring Provider: Shelby Dubin, FNP Primary Care Physician:  Shelby Dubin, FNP  Primary Gastroenterologist: Roetta Sessions, MD   Chief Complaint   No chief complaint on file.   History of Present Illness   Jennifer [name] is a 52 y.o. female presenting today for follow up. Last seen in 09/2021. H/o bloating, constipation, GERD.      EGD 05/2021: -mild erosive reflux esophagitis. Dilated -small hiatal hernia -normal duodenal bulb and second portion of duodenum   Colonoscopy 08/2018: -normal exam -five year screening colonoscopy, due to FH  Medications   Current Outpatient Medications  Medication Sig Dispense Refill   albuterol (PROVENTIL) (2.5 MG/3ML) 0.083% nebulizer solution Take 3 mLs (2.5 mg total) by nebulization every 6 (six) hours as needed for wheezing or shortness of breath. 180 mL 5   albuterol (VENTOLIN HFA) 108 (90 Base) MCG/ACT inhaler Inhale 2 puffs into the lungs every 6 (six) hours as needed for wheezing or shortness of breath. 8 g 5   cetirizine (ZYRTEC) 10 MG tablet Take 1 tablet (10 mg total) by mouth every morning. 90 tablet 3   cyclobenzaprine (FLEXERIL) 10 MG tablet Take 10 mg by mouth at bedtime as needed for muscle spasms.     EPINEPHrine 0.15 MG/0.15ML IJ injection Inject 0.15 mg into the muscle as needed for anaphylaxis.     escitalopram (LEXAPRO) 5 MG tablet Take 5 mg by mouth daily.     lamoTRIgine (LAMICTAL) 200 MG tablet Take 1 tablet (200 mg total) by mouth 2 (two) times daily. 60 tablet 3   linaCLOtide (LINZESS PO) Take by mouth.     metformin (FORTAMET) 500 MG (OSM) 24 hr tablet Take 500 mg by mouth daily with breakfast.     mometasone-formoterol (DULERA) 100-5 MCG/ACT AERO Inhale 2 puffs into the lungs 2 (two) times daily. (Patient not taking: Reported on 05/31/2022) 1 each 5   montelukast (SINGULAIR) 10 MG tablet TAKE 1 TABLET BY MOUTH DAILY AT BEDTIME 90 tablet 2   ondansetron (ZOFRAN) 4 MG tablet Take 1 tablet (4  mg total) by mouth every 8 (eight) hours as needed for nausea or vomiting. 60 tablet 3   pantoprazole (PROTONIX) 40 MG tablet TAKE 1 TABLET BY MOUTH TWICE A DAY BEFORE MEALS 180 tablet 0   QUEtiapine (SEROQUEL) 200 MG tablet Take 200 mg by mouth at bedtime.     traMADol (ULTRAM) 50 MG tablet Take 50 mg by mouth 3 (three) times daily as needed for moderate pain.     Vitamin D, Ergocalciferol, (DRISDOL) 1.25 MG (50000 UT) CAPS capsule Take 1 capsule (50,000 Units total) by mouth every 7 (seven) days. 5 capsule 11   No current facility-administered medications for this visit.    Allergies   Allergies as of 10/23/2022 - Review Complete 06/05/2022  Allergen Reaction Noted   Metrizamide Rash and Swelling 10/17/2010   Shellfish allergy Swelling 10/29/2013   Amoxapine Nausea Only 10/01/2019   Amoxicillin Other (See Comments) 12/13/2020   Other Itching and Swelling 01/26/2022   Penicillin g Other (See Comments) 12/13/2020   Codeine Itching    Hydrocodone Itching and Rash 12/03/2012   Ivp dye [iodinated contrast media] Rash 10/17/2010   Latex Rash 03/08/2010     Past Medical History   Past Medical History:  Diagnosis Date   Allergy    Anemia    Anxiety    Arthritis    neck   Arthritis  Asthma    Chronic kidney disease, stage 2 (mild) 01/21/2020   Diabetes mellitus without complication (HCC)    GERD (gastroesophageal reflux disease)    History of flexible sigmoidoscopy 1999   Normal to 60cm,   Hypertension    Neuromuscular disorder (HCC)    Ovarian cancer (HCC)    skin cancer   Panic attack    PTSD (post-traumatic stress disorder)    S/P endoscopy 03/2010   Nl, s/p 56-French Maloney dilator, biopsy benign   S/P endoscopy 2008   Mild gastritis   Seizures (HCC)    Sleep apnea    Substance abuse (HCC)    drug addiction    Past Surgical History   Past Surgical History:  Procedure Laterality Date   ABDOMINAL HYSTERECTOMY     ovarian tumors   CHOLECYSTECTOMY      COLONOSCOPY  03/01/2011   Rourk-External hemorrhoids/otherwise normal rectum and colon.   COLONOSCOPY WITH PROPOFOL N/A 08/08/2018   entire examined colon is normal. The distal rectum and anal verge are normal on retroflexion view. Repeat in 5 years.    ESOPHAGOGASTRODUODENOSCOPY  2008   gastritis   ESOPHAGOGASTRODUODENOSCOPY  2012   Moderate sized hiatal hernia, non-H. pylori gastritis   ESOPHAGOGASTRODUODENOSCOPY N/A 11/17/2013   RMR: Mild erosive reflux esophagitis. Patulous EG junction   ESOPHAGOGASTRODUODENOSCOPY (EGD) WITH PROPOFOL N/A 08/08/2018   Erosive reflux esophagitis. Dilated. Small hiatal hernia. Otherwise normal.    ESOPHAGOGASTRODUODENOSCOPY (EGD) WITH PROPOFOL N/A 06/02/2021   Procedure: ESOPHAGOGASTRODUODENOSCOPY (EGD) WITH PROPOFOL;  Surgeon: Corbin Ade, MD;  Location: [facility];  Service: Endoscopy;  Laterality: N/A;  2:00pm   HERNIA REPAIR     Incisional with mesh   KNEE ARTHROSCOPY     left knee surgery in [place]   MALONEY DILATION  08/08/2018   Procedure: MALONEY DILATION;  Surgeon: Corbin Ade, MD;  Location: [facility];  Service: Endoscopy;;   MALONEY DILATION N/A 06/02/2021   Procedure: Alvy Beal;  Surgeon: Corbin Ade, MD;  Location: [facility];  Service: Endoscopy;  Laterality: N/A;   PARTIAL HYSTERECTOMY     SKIN CANCER EXCISION     left bicep   TONSILLECTOMY      Past Family History   Family History  Problem Relation Age of Onset   Crohn's disease Maternal Aunt    Breast cancer Maternal Grandmother    Cancer Maternal Grandmother    Colon cancer Maternal Grandfather        78   Pancreatic cancer Maternal Grandfather    Cancer Maternal Grandfather    Crohn's disease Cousin        x 2 cousins   Breast cancer Daughter 78   Arthritis Mother    COPD Mother    Depression Mother    Drug abuse Mother    Heart disease Mother    Hypertension Mother    Hyperlipidemia Mother    Learning disabilities Mother    Mental illness  Mother    Alcohol abuse Mother    Colon polyps Mother 52       tubular adenoma   Early death Father 30       murder   Alcohol abuse Father    Drug abuse Father    Learning disabilities Father    Cancer Paternal Grandmother    Alcohol abuse Paternal Grandmother    Cancer Paternal Grandfather    Alcohol abuse Maternal Uncle    Alcohol abuse Paternal Uncle    Drug abuse Paternal  Uncle     Past Social History   Social History   Socioeconomic History   Marital status: Married    Spouse name: Chrissie Noa   Number of children: Not on file   Years of education: Not on file   Highest education level: 11th grade  Occupational History   Occupation: disabled    Associate Professor: NOT EMPLOYED  Tobacco Use   Smoking status: Every Day    Current packs/day: 0.00    Average packs/day: 0.3 packs/day for 37.9 years (9.5 ttl pk-yrs)    Types: Cigarettes    Start date: 05/03/1984    Last attempt to quit: 03/13/2022    Years since quitting: 0.6    Passive exposure: Current   Smokeless tobacco: Never   Tobacco comments:    Patient states she is smoking based off her mood.   Vaping Use   Vaping status: Never Used  Substance and Sexual Activity   Alcohol use: Not Currently   Drug use: Not Currently    Types: Marijuana   Sexual activity: Not Currently    Birth control/protection: Surgical  Other Topics Concern   Not on file  Social History Narrative   Lives at home with her husband.   Right handed.    1-2 cups of coffee 3 times per wk.    Intermittent soda.   Started back smoking   Social Determinants of Health   Financial Resource Strain: Low Risk  (05/28/2019)   Overall Financial Resource Strain (CARDIA)    Difficulty of Paying Living Expenses: Not hard at all  Food Insecurity: No Food Insecurity (05/28/2019)   Hunger Vital Sign    Worried About Running Out of Food in the Last Year: Never true    Ran Out of Food in the Last Year: Never true  Transportation Needs: No Transportation Needs  (05/28/2019)   PRAPARE - Administrator, Civil Service (Medical): No    Lack of Transportation (Non-Medical): No  Physical Activity: Inactive (05/28/2019)   Exercise Vital Sign    Days of Exercise per Week: 0 days    Minutes of Exercise per Session: 0 min  Stress: Stress Concern Present (05/28/2019)   [employer] of Occupational Health - Occupational Stress Questionnaire    Feeling of Stress : To some extent  Social Connections: Moderately Integrated (05/28/2019)   Social Connection and Isolation Panel [NHANES]    Frequency of Communication with Friends and Family: More than three times a week    Frequency of Social Gatherings with Friends and Family: More than three times a week    Attends Religious Services: 1 to 4 times per year    Active Member of [employer] or Organizations: No    Attends Banker Meetings: Never    Marital Status: Married  Catering manager Violence: Not At Risk (05/28/2019)   Humiliation, Afraid, Rape, and Kick questionnaire    Fear of Current or Ex-Partner: No    Emotionally Abused: No    Physically Abused: No    Sexually Abused: No    Review of Systems   General: Negative for anorexia, weight loss, fever, chills, fatigue, weakness. ENT: Negative for hoarseness, difficulty swallowing , nasal congestion. CV: Negative for chest pain, angina, palpitations, dyspnea on exertion, peripheral edema.  Respiratory: Negative for dyspnea at rest, dyspnea on exertion, cough, sputum, wheezing.  GI: See history of present illness. GU:  Negative for dysuria, hematuria, urinary incontinence, urinary frequency, nocturnal urination.  Endo: Negative for unusual weight change.  Physical Exam   There were no vitals taken for this visit.   General: Well-nourished, well-developed in no acute distress.  Eyes: No icterus. Mouth: Oropharyngeal mucosa moist and pink , no lesions erythema or exudate. Lungs: Clear to auscultation bilaterally.  Heart:  Regular rate and rhythm, no murmurs rubs or gallops.  Abdomen: Bowel sounds are normal, nontender, nondistended, no hepatosplenomegaly or masses,  no abdominal bruits or hernia , no rebound or guarding.  Rectal: ***  Extremities: No lower extremity edema. No clubbing or deformities. Neuro: Alert and oriented x 4   Skin: Warm and dry, no jaundice.   Psych: Alert and cooperative, normal mood and affect.  Labs   Lab Results  Component Value Date   NA 141 03/14/2022   CL 107 03/14/2022   K 4.3 03/14/2022   CO2 25 03/14/2022   BUN 20 03/14/2022   CREATININE 1.19 (H) 03/14/2022   EGFR 55 (L) 03/14/2022   CALCIUM 9.4 03/14/2022   ALBUMIN 3.9 06/01/2020   GLUCOSE 98 03/14/2022   Lab Results  Component Value Date   ALT 39 (H) 03/14/2022   AST 31 03/14/2022   ALKPHOS 69 06/01/2020   BILITOT 0.3 03/14/2022   Lab Results  Component Value Date   WBC 7.1 03/14/2022   HGB 11.9 03/14/2022   HCT 37.8 03/14/2022   MCV 74.6 (L) 03/14/2022   PLT 285 03/14/2022    Imaging Studies   No results found.  Assessment       PLAN   ***   Leanna Battles. Melvyn Neth, MHS, PA-C [facility]

## 2022-10-23 ENCOUNTER — Ambulatory Visit (INDEPENDENT_AMBULATORY_CARE_PROVIDER_SITE_OTHER): Payer: Medicaid Other | Admitting: Gastroenterology

## 2022-10-23 ENCOUNTER — Encounter: Payer: Self-pay | Admitting: Gastroenterology

## 2022-10-23 VITALS — BP 113/78 | HR 88 | Temp 98.2°F | Ht 66.0 in | Wt 248.0 lb

## 2022-10-23 DIAGNOSIS — K219 Gastro-esophageal reflux disease without esophagitis: Secondary | ICD-10-CM | POA: Diagnosis not present

## 2022-10-23 DIAGNOSIS — B370 Candidal stomatitis: Secondary | ICD-10-CM | POA: Diagnosis not present

## 2022-10-23 MED ORDER — NYSTATIN 100000 UNIT/ML MT SUSP: 5.0000 mL | Freq: Four times a day (QID) | OROMUCOSAL | 0 refills | Status: AC

## 2022-10-23 MED ORDER — FAMOTIDINE 20 MG PO TABS: 20.0000 mg | ORAL_TABLET | Freq: Every day | ORAL | 1 refills | Status: DC

## 2022-10-23 NOTE — Patient Instructions (Addendum)
Continue pantoprazole 40mg  twice daily, TAKE BEFORE BREAKFAST AND BEFORE SUPPER. We are adding another reflux medication, famotidine, I would like for you to take at bedtime.  I am giving you medication for thrush. It is a liquid you will need to take 5mL four times a day for 14 days. Try to hold liquid in your mouth as long as possible then swallow it. I could not give you the medication that is a pill because it interfered with one of your other medications.  Please call my CMA Tammy at [phone] in 2 weeks and let us know if you are feeling better. I suspect your spinal fluid leak is also contributing to your throat symptoms due to frequent post nasal drip, hopefully this will get better after surgery.

## 2022-10-30 ENCOUNTER — Telehealth: Payer: Self-pay

## 2022-10-30 NOTE — Telephone Encounter (Signed)
That is good to hear

## 2022-10-30 NOTE — Telephone Encounter (Signed)
Pt called to let you know that the rx you sent in is working and that she is feeling better.

## 2022-12-28 ENCOUNTER — Ambulatory Visit: Payer: Medicaid Other | Admitting: Neurology

## 2022-12-28 ENCOUNTER — Encounter: Payer: Self-pay | Admitting: Neurology

## 2023-01-13 ENCOUNTER — Other Ambulatory Visit: Payer: Self-pay | Admitting: Internal Medicine

## 2023-02-15 ENCOUNTER — Ambulatory Visit (INDEPENDENT_AMBULATORY_CARE_PROVIDER_SITE_OTHER): Payer: Medicaid Other | Admitting: Orthopedic Surgery

## 2023-02-15 ENCOUNTER — Encounter: Payer: Self-pay | Admitting: Orthopedic Surgery

## 2023-02-15 DIAGNOSIS — M25561 Pain in right knee: Secondary | ICD-10-CM

## 2023-02-15 DIAGNOSIS — M1710 Unilateral primary osteoarthritis, unspecified knee: Secondary | ICD-10-CM

## 2023-02-15 DIAGNOSIS — G8929 Other chronic pain: Secondary | ICD-10-CM | POA: Diagnosis not present

## 2023-02-15 DIAGNOSIS — M25562 Pain in left knee: Secondary | ICD-10-CM | POA: Diagnosis not present

## 2023-02-15 MED ORDER — METHYLPREDNISOLONE ACETATE 40 MG/ML IJ SUSP
40.0000 mg | Freq: Once | INTRAMUSCULAR | Status: AC
  Administered 2023-02-15: 40 mg via INTRA_ARTICULAR

## 2023-02-15 NOTE — Patient Instructions (Signed)
 Joint Steroid Injection A joint steroid injection is a procedure to relieve swelling and pain in a joint. Steroids are medicines that reduce inflammation. In this procedure, your health care provider uses a syringe and a needle to inject a steroid medicine into a painful and inflamed joint. A pain-relieving medicine (anesthetic) may be injected along with the steroid. In some cases, your health care provider may use an imaging technique such as ultrasound or fluoroscopy to guide the injection. Joints that are often treated with steroid injections include the knee, shoulder, hip, and spine. These injections may also be used in the elbow, ankle, and joints of the hands or feet. You may have joint steroid injections as part of your treatment for inflammation caused by: Gout. Rheumatoid arthritis. Advanced wear-and-tear arthritis (osteoarthritis). Tendinitis. Bursitis. Joint steroid injections may be repeated, but having them too often can damage a joint or the skin over the joint. You should not have joint steroid injections less than 6 weeks apart or more than four times a year. Tell a health care provider about: Any allergies you have. All medicines you are taking, including vitamins, herbs, eye drops, creams, and over-the-counter medicines. Any problems you or family members have had with anesthetic medicines. Any blood disorders you have. Any surgeries you have had. Any medical conditions you have. Whether you are pregnant or may be pregnant. What are the risks? Generally, this is a safe treatment. However, problems may occur, including: Infection. Bleeding. Allergic reactions to medicines. Damage to the joint or tissues around the joint. Thinning of skin or loss of skin color over the joint. Temporary flushing of the face or chest. Temporary increase in pain. Temporary increase in blood sugar. Failure to relieve inflammation or pain. What happens before the treatment? Medicines Ask  your health care provider about: Changing or stopping your regular medicines. This is especially important if you are taking diabetes medicines or blood thinners. Taking medicines such as aspirin and ibuprofen. These medicines can thin your blood. Do not take these medicines unless your health care provider tells you to take them. Taking over-the-counter medicines, vitamins, herbs, and supplements. General instructions You may have imaging tests of your joint. Ask your health care provider if you can drive yourself home after the procedure. What happens during the treatment?  Your health care provider will position you for the injection and locate the injection site over your joint. The skin over the joint will be cleaned with a germ-killing soap. Your health care provider may: Spray a numbing solution (topical anesthetic) over the injection site. Inject a local anesthetic under the skin above your joint. The needle will be placed through your skin into your joint. Your health care provider may use imaging to guide the needle to the right spot for the injection. If imaging is used, a special contrast dye may be injected to confirm that the needle is in the correct location. The steroid medicine will be injected into your joint. Anesthetic may be injected along with the steroid. This may be a medicine that relieves pain for a short time (short-acting anesthetic) or for a longer time (long-acting anesthetic). The needle will be removed, and an adhesive bandage (dressing) will be placed over the injection site. The procedure may vary among health care providers and hospitals. What can I expect after the treatment? You will be able to go home after the treatment. It is normal to feel slight flushing for a few days after the injection. After the treatment, it is  common to have an increase in joint pain after the anesthetic has worn off. This may happen about an hour after a short-acting anesthetic  or about 8 hours after a longer-acting anesthetic. You should begin to feel relief from joint pain and swelling after 24 to 48 hours. Contact your health care provider if you do not begin to feel relief after 2 days. Follow these instructions at home: Injection site care Leave the adhesive dressing over your injection site in place until your health care provider says you can remove it. Check your injection site every day for signs of infection. Check for: More redness, swelling, or pain. Fluid or blood. Warmth. Pus or a bad smell. Activity Return to your normal activities as told by your health care provider. Ask your health care provider what activities are safe for you. You may be asked to limit activities that put stress on the joint for a few days. Do joint exercises as told by your health care provider. Do not take baths, swim, or use a hot tub until your health care provider approves. Ask your health care provider if you may take showers. You may only be allowed to take sponge baths. Managing pain, stiffness, and swelling  If directed, put ice on the joint. To do this: Put ice in a plastic bag. Place a towel between your skin and the bag. Leave the ice on for 20 minutes, 2-3 times a day. Remove the ice if your skin turns bright red. This is very important. If you cannot feel pain, heat, or cold, you have a greater risk of damage to the area. Raise (elevate) your joint above the level of your heart when you are sitting or lying down. General instructions Take over-the-counter and prescription medicines only as told by your health care provider. Do not use any products that contain nicotine or tobacco, such as cigarettes, e-cigarettes, and chewing tobacco. These can delay joint healing. If you need help quitting, ask your health care provider. If you have diabetes, be aware that your blood sugar may be slightly elevated for several days after the injection. Keep all follow-up visits.  This is important. Contact a health care provider if you have: Chills or a fever. Any signs of infection at your injection site. Increased pain or swelling or no relief after 2 days. Summary A joint steroid injection is a treatment to relieve pain and swelling in a joint. Steroids are medicines that reduce inflammation. Your health care provider may add an anesthetic along with the steroid. You may have joint steroid injections as part of your arthritis treatment. Joint steroid injections may be repeated, but having them too often can damage a joint or the skin over the joint. Contact your health care provider if you have a fever, chills, or signs of infection, or if you get no relief from joint pain or swelling. This information is not intended to replace advice given to you by your health care provider. Make sure you discuss any questions you have with your health care provider. Document Revised: 07/04/2019 Document Reviewed: 07/04/2019 Elsevier Patient Education  2024 [employer].

## 2023-02-15 NOTE — Progress Notes (Signed)
   There were no vitals taken for this visit.  There is no height or weight on file to calculate BMI.  Chief Complaint  Patient presents with   Injections    Both knees     Encounter Diagnoses  Name Primary?   Chronic pain of right knee Yes   Chronic pain of left knee    Primary localized osteoarthritis of knee both knees     DOI/DOS/ Date: n/a  Worse

## 2023-02-15 NOTE — Progress Notes (Signed)
   Procedure note for injection   Chief Complaint  Patient presents with   Injections    Both knees      Encounter Diagnoses  Name Primary?   Chronic pain of right knee Yes   Chronic pain of left knee    Primary localized osteoarthritis of knee both knees         The patient has consented for injection of the right and left knee joints   Medication: Depo-Medrol  40 mg and lidocaine  1%  Time out completed: Yes  The site of injection right knee lateral side was cleaned with alcohol and ethyl chloride.  The injection was given without any complications appropriate precautions were given.  This was repeated on th left side

## 2023-02-26 ENCOUNTER — Telehealth: Payer: Self-pay

## 2023-02-26 MED ORDER — FAMOTIDINE 20 MG PO TABS: 20.0000 mg | ORAL_TABLET | Freq: Every day | ORAL | 5 refills | Status: DC

## 2023-02-26 NOTE — Telephone Encounter (Signed)
[facility] called to request refills on famotidine. Pt was last seen on 10/23/22.

## 2023-02-26 NOTE — Addendum Note (Signed)
Addended by: Tiffany Kocher on: 02/26/2023 01:16 PM   Modules accepted: Orders

## 2023-03-13 ENCOUNTER — Telehealth: Payer: Self-pay | Admitting: Neurology

## 2023-03-13 ENCOUNTER — Ambulatory Visit: Payer: Medicaid Other | Admitting: Neurology

## 2023-03-13 ENCOUNTER — Encounter: Payer: Self-pay | Admitting: Neurology

## 2023-03-13 VITALS — BP 105/73 | HR 79 | Ht 66.0 in | Wt 244.0 lb

## 2023-03-13 DIAGNOSIS — G40909 Epilepsy, unspecified, not intractable, without status epilepticus: Secondary | ICD-10-CM

## 2023-03-13 DIAGNOSIS — G43709 Chronic migraine without aura, not intractable, without status migrainosus: Secondary | ICD-10-CM | POA: Diagnosis not present

## 2023-03-13 DIAGNOSIS — R519 Headache, unspecified: Secondary | ICD-10-CM | POA: Insufficient documentation

## 2023-03-13 MED ORDER — NURTEC 75 MG PO TBDP: ORAL_TABLET | ORAL | 11 refills | Status: DC

## 2023-03-13 NOTE — Telephone Encounter (Signed)
Get record from her ophthalmologist Dr. Naoma Diener.  [phone]

## 2023-03-13 NOTE — Progress Notes (Addendum)
 ASSESSMENT AND PLAN 53 y.o. 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 2022,which did stop her nasal leakage However she continue to have frequent headaches, Refer to ophthalmologist for baseline, evaluation for possible intracranial hypertension  Fluoroscopy guided LP, check opening pressure Headache with migraine features  Nurtec as needed  Return To Clinic With NP In 6 Months   Data reviewed Ophthalmology evaluation from [facility] on February 06, 2023 pseudopapillary edema,   HISTORY  Jennifer [name] is a 53 year old female, accompanied by her husband, seen in request by primary care nurse practitioner Bucio, Elsa C, to continue her neurological care at our clinic, initial evaluation was on January 26, 2022, she was previously seen by [facility] neurologist Dr. Milton, who has closed his neurological practice   I reviewed and summarized the referring note. PMHX, extensive records from Dr.Doonquah's office Bipolar, on polypharmacy Ovarian Cancer was treated in 2000,  DM HTN HLD PTSD  History of substance abuse, cocaine in young, quit for many years. Smoke,   She had seizure, 2015, frequent seizure spells with a couple years of span, in the setting of extreme stress, preceding by headache, confusion, feeling nervous, mild bilateral hands tremor, then would involve into whole-body tremor, sometimes with bowel incontinence, lasting 10 to 15 minutes, she was treated on Keppra  slow titrating dose now 750 mg 4 tablets every night, does complains of side effect, also feeling anxious   Last reported seizure activity in 2015   She carries a diagnosis  of bipolar disorder, PTSD, on polypharmacy for her mood disorder, this include lamotrigine  200 mg every night, Lexapro 5 mg daily, Seroquel 200 mg every night   She also has a history of chronic low back pain, frequent flareup, most recent flareup was few weeks ago, she get up after bending over to pick up her telephone cord, she felt her lower back has shifted, sharp radiating pain, she could not move for few minutes, then was able to walk, ever since then, she complains of worsening right-sided low back pain, radiating pain to right hip, gait abnormality, worsening pain bearing weight   She had long history of diabetes, no complaints of bilateral feet numbness tingling, urinary urgency, frequency,   She also complains of worsening headache, retro-orbital area, sometimes occipital region, shooting pain from front to back  UPDATE Feb 4th 2025: She was seen by [facility] in August 2024,  CT showed Small volume fluid and mucosal thickening along the left olfactory cleft underlying the cribriform plate , ethmoidal csf leakage on the termporal bone  Nasal endoscopy:  8mm area of submucosal fluid noted along perpendicular plate of the ethmoid on the left within the olfactory cleft without active extravasation of CSF  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    Post surgical follow up on Jan 17 2023, nasal endoscopy exam showed no significant abnormality  She no longer has nasal discharge, however continue have frequent headaches, transient left blurry vision, sharp pain to her left eye,  Recently also had bilateral knee joint injection for bilateral knee pain, had bilateral knee injection, her gait complaints most likely related to  her knee issues   PHYSICAL EXAMNIATION:    03/13/2023    3:20 PM 10/23/2022    8:59 AM 06/14/2022   11:25 AM  Vitals with BMI  Height 5' 6 5' 6   Weight 244 lbs 248 lbs   BMI 39.4 40.05   Systolic 105 113  128  Diastolic 73 78 75  Pulse 79 88 70     Gen: NAD, conversant, well nourised, well groomed                     Cardiovascular: Regular rate rhythm, no peripheral edema, warm, nontender. Eyes: Conjunctivae clear without exudates or hemorrhage Neck: Supple, no carotid bruits. Pulmonary: Clear to auscultation bilaterally   NEUROLOGICAL EXAM:  MENTAL STATUS: Speech/cognition: Awake, alert oriented to history taking and casual conversation  CRANIAL NERVES: CN II: Visual fields are full to confrontation.  Pupils are round equal and briskly reactive to light. CN III, IV, VI: extraocular movement are normal. No ptosis. CN V: Facial sensation is intact to pinprick in all 3 divisions bilaterally. Corneal responses are intact.  CN VII: Face is symmetric with normal eye closure and smile. CN VIII: Hearing is normal to casual conversation CN IX, X: Palate elevates symmetrically. Phonation is normal. CN XI: Head turning and shoulder shrug are intact CN XII: Tongue is midline with normal movements and no atrophy.  MOTOR: There is no pronator drift of out-stretched arms. Muscle bulk and tone are normal. Muscle strength is normal.  REFLEXES: Reflexes are 2+ and symmetric at the biceps, triceps, knees, and ankles. Plantar responses are flexor.  SENSORY: Intact to light touch, pinprick, positional and vibratory sensation are intact in fingers and toes.  COORDINATION: Rapid alternating movements and fine finger movements are intact. There is no dysmetria on finger-to-nose and heel-knee-shin.    GAIT/STANCE: Push-up to get up from seated position, cautious    REVIEW OF SYSTEMS: Out of a complete 14 system review of symptoms, the patient complains only of the following symptoms, and all other reviewed systems are negative.  See HPI  ALLERGIES: Allergies  Allergen Reactions   Metrizamide Rash and Swelling    Hands swelling/rash   Shellfish Allergy Swelling    Swells tongue    Amoxapine Nausea Only   Amoxicillin  Other (See Comments)    WHAT IS THE REACTION?   Other Itching and Swelling    Shingles vaccine   Penicillin G Other (See Comments)    WHAT IS THE REACTION?   Zoster Vaccine Recombinant, Adjuvanted    Codeine Itching   Hydrocodone  Itching and Rash   Ivp Dye [Iodinated Contrast Media] Rash    Hands swelling/rash   Latex Rash    HOME MEDICATIONS: Outpatient Medications Prior to Visit  Medication Sig Dispense Refill   albuterol  (VENTOLIN  HFA) 108 (90 Base) MCG/ACT inhaler Inhale into the lungs every 6 (six) hours as needed for wheezing or shortness of breath.     busPIRone  (BUSPAR ) 10 MG tablet Take 10 mg by mouth 3 (three) times daily.     cetirizine  (ZYRTEC ) 10 MG tablet Take 1 tablet (10 mg total) by mouth every morning. 90 tablet 3   EPINEPHrine 0.15 MG/0.15ML IJ injection Inject 0.15 mg into the muscle as needed for anaphylaxis.     escitalopram (LEXAPRO) 5 MG tablet Take 5 mg by mouth daily.     famotidine  (PEPCID ) 20 MG tablet Take 1 tablet (20 mg total) by mouth at bedtime. 90 tablet 5   lamoTRIgine  (LAMICTAL )  200 MG tablet Take 1 tablet (200 mg total) by mouth 2 (two) times daily. 60 tablet 3   metFORMIN (GLUCOPHAGE) 500 MG tablet Take 500 mg by mouth daily with breakfast.     montelukast  (SINGULAIR ) 10 MG tablet TAKE 1 TABLET BY MOUTH DAILY AT BEDTIME 90 tablet 2   oxybutynin  (DITROPAN ) 5 MG tablet Take 5 mg by mouth 3 (three) times daily.     OZEMPIC, 1 MG/DOSE, 4 MG/3ML SOPN Inject into the skin.     pantoprazole  (PROTONIX ) 40 MG tablet TAKE 1 TABLET BY MOUTH TWICE A DAY BEFORE MEALS 180 tablet 2   QUEtiapine (SEROQUEL XR) 200 MG 24 hr tablet Take 200 mg by mouth at bedtime.     rosuvastatin (CRESTOR) 20 MG tablet Take 20 mg by mouth daily.     traZODone  (DESYREL ) 50 MG tablet Take 50 mg by mouth at bedtime.     Vitamin D , Ergocalciferol , (DRISDOL ) 1.25 MG (50000 UT) CAPS capsule Take 1 capsule (50,000 Units total) by mouth every 7 (seven)  days. 5 capsule 11   No facility-administered medications prior to visit.    PAST MEDICAL HISTORY: Past Medical History:  Diagnosis Date   Allergy    Anemia    Anxiety    Arthritis    neck   Arthritis    Asthma    Chronic kidney disease, stage 2 (mild) 01/21/2020   CSF leak    Diabetes mellitus without complication (HCC)    GERD (gastroesophageal reflux disease)    History of flexible sigmoidoscopy 1999   Normal to 60cm,   Hypertension    Neuromuscular disorder (HCC)    Ovarian cancer (HCC)    skin cancer   Panic attack    PTSD (post-traumatic stress disorder)    S/P endoscopy 03/2010   Nl, s/p 56-French Maloney dilator, biopsy benign   S/P endoscopy 2008   Mild gastritis   Seizures (HCC)    Sleep apnea    Substance abuse (HCC)    drug addiction    PAST SURGICAL HISTORY: Past Surgical History:  Procedure Laterality Date   ABDOMINAL HYSTERECTOMY     ovarian tumors   CHOLECYSTECTOMY     COLONOSCOPY  03/01/2011   Rourk-External hemorrhoids/otherwise normal rectum and colon.   COLONOSCOPY WITH PROPOFOL  N/A 08/08/2018   entire examined colon is normal. The distal rectum and anal verge are normal on retroflexion view. Repeat in 5 years.    ESOPHAGOGASTRODUODENOSCOPY  2008   gastritis   ESOPHAGOGASTRODUODENOSCOPY  2012   Moderate sized hiatal hernia, non-H. pylori gastritis   ESOPHAGOGASTRODUODENOSCOPY N/A 11/17/2013   RMR: Mild erosive reflux esophagitis. Patulous EG junction   ESOPHAGOGASTRODUODENOSCOPY (EGD) WITH PROPOFOL  N/A 08/08/2018   Erosive reflux esophagitis. Dilated. Small hiatal hernia. Otherwise normal.    ESOPHAGOGASTRODUODENOSCOPY (EGD) WITH PROPOFOL  N/A 06/02/2021   Procedure: ESOPHAGOGASTRODUODENOSCOPY (EGD) WITH PROPOFOL ;  Surgeon: Shaaron Lamar HERO, MD;  Location: [facility];  Service: Endoscopy;  Laterality: N/A;  2:00pm   HERNIA REPAIR     Incisional with mesh   KNEE ARTHROSCOPY     left knee surgery in [place]   MALONEY DILATION  08/08/2018    Procedure: MALONEY DILATION;  Surgeon: Shaaron Lamar HERO, MD;  Location: [facility];  Service: Endoscopy;;   MALONEY DILATION N/A 06/02/2021   Procedure: AGAPITO HODGKIN;  Surgeon: Shaaron Lamar HERO, MD;  Location: [facility];  Service: Endoscopy;  Laterality: N/A;   PARTIAL HYSTERECTOMY     SKIN CANCER EXCISION  left bicep   TONSILLECTOMY      FAMILY HISTORY: Family History  Problem Relation Age of Onset   Crohn's disease Maternal Aunt    Breast cancer Maternal Grandmother    Cancer Maternal Grandmother    Colon cancer Maternal Grandfather        78   Pancreatic cancer Maternal Grandfather    Cancer Maternal Grandfather    Crohn's disease Cousin        x 2 cousins   Breast cancer Daughter 10   Arthritis Mother    COPD Mother    Depression Mother    Drug abuse Mother    Heart disease Mother    Hypertension Mother    Hyperlipidemia Mother    Learning disabilities Mother    Mental illness Mother    Alcohol abuse Mother    Colon polyps Mother 47       tubular adenoma   Early death Father 33       murder   Alcohol abuse Father    Drug abuse Father    Learning disabilities Father    Cancer Paternal Grandmother    Alcohol abuse Paternal Grandmother    Cancer Paternal Grandfather    Alcohol abuse Maternal Uncle    Alcohol abuse Paternal Uncle    Drug abuse Paternal Uncle     SOCIAL HISTORY: Social History   Socioeconomic History   Marital status: Married    Spouse name: Elsie   Number of children: Not on file   Years of education: Not on file   Highest education level: 11th grade  Occupational History   Occupation: disabled    Associate Professor: NOT EMPLOYED  Tobacco Use   Smoking status: Every Day    Current packs/day: 0.50    Average packs/day: 0.3 packs/day for 38.9 years (10.0 ttl pk-yrs)    Types: Cigarettes    Start date: 05/03/1984    Passive exposure: Current   Smokeless tobacco: Never   Tobacco comments:    Patient states she is smoking based off her  mood.   Vaping Use   Vaping status: Never Used  Substance and Sexual Activity   Alcohol use: Not Currently   Drug use: Not Currently    Types: Marijuana   Sexual activity: Yes    Birth control/protection: Surgical  Other Topics Concern   Not on file  Social History Narrative   Lives at home with her husband.   Right handed.    1-2 cups of coffee 3 times per wk.    Intermittent soda.   Started back smoking   Social Drivers of Corporate Investment Banker Strain: Low Risk  (11/29/2022)   Received from [facility]   Overall Financial Resource Strain (CARDIA)    Difficulty of Paying Living Expenses: Not hard at all  Food Insecurity: No Food Insecurity (11/29/2022)   Received from [facility]   Hunger Vital Sign    Worried About Running Out of Food in the Last Year: Never true    Ran Out of Food in the Last Year: Never true  Transportation Needs: No Transportation Needs (11/29/2022)   Received from [facility] - Transportation    Lack of Transportation (Medical): No    Lack of Transportation (Non-Medical): No  Physical Activity: Inactive (05/28/2019)   Exercise Vital Sign    Days of Exercise per Week: 0 days    Minutes of Exercise per Session: 0 min  Stress: Stress Concern Present (  05/28/2019)   [employer] - Occupational Stress Questionnaire    Feeling of Stress : To some extent  Social Connections: Moderately Integrated (05/28/2019)   Social Connection and Isolation Panel [NHANES]    Frequency of Communication with Friends and Family: More than three times a week    Frequency of Social Gatherings with Friends and Family: More than three times a week    Attends Religious Services: 1 to 4 times per year    Active Member of [employer] or Organizations: No    Attends Banker Meetings: Never    Marital Status: Married  Catering Manager Violence: Not At Risk (05/28/2019)   Humiliation, Afraid, Rape, and Kick questionnaire     Fear of Current or Ex-Partner: No    Emotionally Abused: No    Physically Abused: No    Sexually Abused: No   PHYSICAL EXAM  Vitals:   03/13/23 1520  BP: 105/73  Pulse: 79  Weight: 244 lb (110.7 kg)  Height: 5' 6 (1.676 m)   Body mass index is 39.38 kg/m.  Generalized: Well developed, in no acute distress  Neurological examination  Mentation: Alert oriented to time, place, history taking. Follows all commands speech and language fluent Cranial nerve II-XII: Pupils were equal round reactive to light. Extraocular movements were full, visual field were full on confrontational test. Facial sensation and strength were normal. Head turning and shoulder shrug  were normal and symmetric. Motor: The motor testing reveals 5 over 5 strength of all 4 extremities. Good symmetric motor tone is noted throughout.  Sensory: Sensory testing is intact to soft touch on all 4 extremities. No evidence of extinction is noted.  Coordination: Cerebellar testing reveals good finger-nose-finger and heel-to-shin bilaterally.  Gait and station: Gait is steady, but husband walks with her for support Reflexes: Deep tendon reflexes are symmetric and normal bilaterally.   DIAGNOSTIC DATA (LABS, IMAGING, TESTING) - I reviewed patient records, labs, notes, testing and imaging myself where available.  Lab Results  Component Value Date   WBC 7.1 03/14/2022   HGB 11.9 03/14/2022   HCT 37.8 03/14/2022   MCV 74.6 (L) 03/14/2022   PLT 285 03/14/2022      Component Value Date/Time   NA 141 03/14/2022 0217   NA 140 02/07/2018 1011   K 4.3 03/14/2022 0217   K 4.1 02/23/2012 0000   CL 107 03/14/2022 0217   CO2 25 03/14/2022 0217   GLUCOSE 98 03/14/2022 0217   BUN 20 03/14/2022 0217   BUN 13 02/07/2018 1011   CREATININE 1.19 (H) 03/14/2022 0217   CALCIUM 9.4 03/14/2022 0217   CALCIUM 8.9 02/23/2012 0000   PROT 7.0 03/14/2022 0217   PROT 7.2 04/16/2018 1153   ALBUMIN 3.9 06/01/2020 1224   ALBUMIN 4.1  04/16/2018 1153   AST 31 03/14/2022 0217   AST 13 02/23/2012 0000   ALT 39 (H) 03/14/2022 0217   ALKPHOS 69 06/01/2020 1224   ALKPHOS 71 02/23/2012 0000   BILITOT 0.3 03/14/2022 0217   BILITOT 0.3 04/16/2018 1153   BILITOT 0.1 02/23/2012 0000   GFRNONAA >60 06/01/2020 1224   GFRNONAA 61 10/12/2016 1517   GFRAA 57 (L) 02/07/2018 1011   GFRAA 71 10/12/2016 1517   Lab Results  Component Value Date   CHOL 172 02/07/2018   HDL 49 02/07/2018   LDLCALC 108 (H) 02/07/2018   TRIG 74 02/07/2018   CHOLHDL 3.5 02/07/2018   Lab Results  Component Value Date   HGBA1C  5.4 02/07/2018   Lab Results  Component Value Date   VITAMINB12 321 04/11/2018   Lab Results  Component Value Date   TSH 1.750 02/07/2018    Modena Callander, M.D. Ph.D.  [facility] [address] [place], [place] [address] Phone: [phone] Fax:      [phone]   Total time spent reviewing the chart, obtaining history, examined patient, ordering tests, documentation, consultations and family, care coordination was  55 minutes

## 2023-03-13 NOTE — Telephone Encounter (Signed)
Called and spoke to eye dr representative and asked them over records

## 2023-03-14 ENCOUNTER — Telehealth: Payer: Self-pay | Admitting: Neurology

## 2023-03-14 NOTE — Telephone Encounter (Signed)
 Referral for ophthalmology fax to [facility]. Phone: [phone], Fax: [phone]

## 2023-03-16 ENCOUNTER — Other Ambulatory Visit (HOSPITAL_COMMUNITY): Payer: Self-pay

## 2023-03-16 ENCOUNTER — Telehealth: Payer: Self-pay

## 2023-03-16 DIAGNOSIS — G43709 Chronic migraine without aura, not intractable, without status migrainosus: Secondary | ICD-10-CM

## 2023-03-16 NOTE — Telephone Encounter (Signed)
*  [facility]  Pharmacy Patient Advocate Encounter   Received notification from Fax that prior authorization for Nurtec 75MG  dispersible tablets  is required/requested.   Insurance verification completed.   The patient is insured through [facility] .   Per test claim: PA required; PA started via CoverMyMeds. KEY [id number] . Please see clinical question(s) below that I am not finding the answer to in her chart and advise.   Does the patient have headache frequency of greater than or equal to 15 headache days per month during the prior 6 months?*

## 2023-03-16 NOTE — Telephone Encounter (Signed)
 Has the patient tried and failed, or has a contraindication to, at least TWO preferred triptans? Please note: Preferred and Non-Preferred Drugs are identified on the PDL-Preferred Drug List at [email or web address]

## 2023-03-16 NOTE — Telephone Encounter (Signed)
 Asked pt how many per month and she stated yes 5 or more per day

## 2023-03-19 MED ORDER — SUMATRIPTAN SUCCINATE 50 MG PO TABS: ORAL_TABLET | ORAL | 5 refills | Status: DC

## 2023-03-19 NOTE — Telephone Encounter (Signed)
 Called and spoke to pt and stated   SUMAtriptan     Was ordered and they voiced gratitude and understanding.   Pt stated that they needed permission for social worker to be talked to. I advised must have said person come and fill out designated party release for us  to communicate with anyone else!

## 2023-03-19 NOTE — Telephone Encounter (Signed)
 Called and spoke to pt and stated that insurance will not approve nurtec due to not trying and failing 2 triptans. She couldn't remember what she has tried and failed. Only tried tylenol .

## 2023-03-19 NOTE — Telephone Encounter (Signed)
 Meds ordered this encounter  Medications   SUMAtriptan  (IMITREX ) 50 MG tablet    Sig: May repeat in 2 hours if headache persists or recurs.    Dispense:  10 tablet    Refill:  5

## 2023-03-22 ENCOUNTER — Other Ambulatory Visit: Payer: Medicaid Other

## 2023-03-22 ENCOUNTER — Telehealth: Payer: Self-pay

## 2023-03-22 NOTE — Telephone Encounter (Signed)
Jennifer [name] from [facility] calling clarification of imitrex. Advised 1 tablet at onset of migraine. May repeat 1 tablet in 2 hours if needed. No more than 2 tablets in 24 hr period

## 2023-03-26 ENCOUNTER — Encounter: Payer: Self-pay | Admitting: Neurology

## 2023-03-30 NOTE — Discharge Instructions (Signed)

## 2023-04-02 ENCOUNTER — Ambulatory Visit
Admission: RE | Admit: 2023-04-02 | Discharge: 2023-04-02 | Disposition: A | Discharge status: Home / Self Care | Payer: Medicaid Other | Source: Ambulatory Visit | Attending: Neurology | Admitting: Neurology

## 2023-04-02 DIAGNOSIS — G40909 Epilepsy, unspecified, not intractable, without status epilepticus: Secondary | ICD-10-CM

## 2023-04-02 DIAGNOSIS — R519 Headache, unspecified: Secondary | ICD-10-CM

## 2023-04-03 ENCOUNTER — Telehealth: Payer: Self-pay

## 2023-04-03 NOTE — Telephone Encounter (Signed)
 Called pt to see if she was using her CPAP,was informed insurance stopped paying for it and had to send it back.

## 2023-04-04 ENCOUNTER — Encounter: Payer: Self-pay | Admitting: Nurse Practitioner

## 2023-04-04 ENCOUNTER — Ambulatory Visit (INDEPENDENT_AMBULATORY_CARE_PROVIDER_SITE_OTHER): Payer: Medicaid Other | Admitting: Nurse Practitioner

## 2023-04-04 ENCOUNTER — Ambulatory Visit: Payer: Medicaid Other

## 2023-04-04 VITALS — BP 110/78 | HR 91 | Ht 66.0 in | Wt 251.2 lb

## 2023-04-04 DIAGNOSIS — G4733 Obstructive sleep apnea (adult) (pediatric): Secondary | ICD-10-CM

## 2023-04-04 DIAGNOSIS — J4540 Moderate persistent asthma, uncomplicated: Secondary | ICD-10-CM

## 2023-04-04 DIAGNOSIS — R0683 Snoring: Secondary | ICD-10-CM

## 2023-04-04 DIAGNOSIS — G4719 Other hypersomnia: Secondary | ICD-10-CM | POA: Diagnosis not present

## 2023-04-04 DIAGNOSIS — J45998 Other asthma: Secondary | ICD-10-CM

## 2023-04-04 DIAGNOSIS — K219 Gastro-esophageal reflux disease without esophagitis: Secondary | ICD-10-CM

## 2023-04-04 DIAGNOSIS — R0609 Other forms of dyspnea: Secondary | ICD-10-CM

## 2023-04-04 MED ORDER — BUDESONIDE 0.5 MG/2ML IN SUSP: 0.5000 mg | Freq: Two times a day (BID) | RESPIRATORY_TRACT | 11 refills | Status: DC

## 2023-04-04 MED ORDER — IPRATROPIUM-ALBUTEROL 0.5-2.5 (3) MG/3ML IN SOLN: 3.0000 mL | Freq: Four times a day (QID) | RESPIRATORY_TRACT | 5 refills | Status: DC | PRN

## 2023-04-04 NOTE — Patient Instructions (Addendum)
 Continue Albuterol inhaler 2 puffs or duoneb 3 mL neb every 6 hours as needed for shortness of breath or wheezing. Notify if symptoms persist despite rescue inhaler/neb use. Use duoneb Twice daily with your budesonide  Restart budesonide 2 mL neb treatment Twice daily. Brush tongue and rinse mouth afterwards  Continue cetirizine 1 tab daily for allergies  Talk to your GI doctor about the reflux trouble you've been having  Given your symptoms, I am concerned that you may have sleep disordered breathing with sleep apnea. You will need a sleep study for further evaluation. Someone will contact you to schedule this.   We discussed how untreated sleep apnea puts an individual at risk for cardiac arrhthymias, pulm HTN, DM, stroke and increases their risk for daytime accidents. We also briefly reviewed treatment options including weight loss, side sleeping position, oral appliance, CPAP therapy or referral to ENT for possible surgical options  Use caution when driving and pull over if you become sleepy.  Asthma action plan: START nebs up to four times a day for worsening shortness of breath, wheezing and cough. If you symptoms do not improve in 24-48 hours, contact us for steroid course. If your symptoms rapidly worsen, you have trouble talking or extreme difficulty breathing, or you're not getting relief from your nebulizer/rescue, go to the emergency department.  Chest x ray today   Follow up in 6-8 weeks with Dr. Wynona Neat (new pt 30 min slot) or Katie Dayan Desa,NP to go over sleep study results and check on asthma. If symptoms do not improve or worsen, please contact office for sooner follow up or seek emergency care.

## 2023-04-04 NOTE — Progress Notes (Signed)
 @Patient  ID: Jennifer [name], female    DOB: February 05, 1971, 53 y.o.   MRN: [id number]  Chief Complaint  Patient presents with   Follow-up    Referring provider: BucioJulian Reil, FNP  HPI: 53 year old female, active smoker followed for asthma and OSA. She is a former patient of Dr. Evlyn Courier and last seen in office 06/05/2022. Past medical history significant for migraine, HTN, hiatal hernia, GERD, diabetic renal disease, hx of CSF leak, seizure disorder, ETOH abuse, intellectual disability, hx of substance abuse, PTSD, depression.   TEST/EVENTS:  06/29/2020 PFT: FVC 85, FEV1 88, ratio 85, TLC 90, DLCO 95 09/02/2020 HST: AHI 7.6/h, SpO2 low 85%; weight 258 lb  06/05/2022: OV with Dr. Craige Cotta. Seen by ENT at [facility] - upcoming appt with NSG for potential CSF leak. No cough, sputum, wheeze. Gets winded wild walking. Feels better after resting a few minutes. CXR without acute process; chronic bronchitic changes. Continue Dulera BID. Intolerant of CPAP in past - will f/u with Dr. Toni Arthurs once CSF leak fixed to evaluate for oral appliance.   04/04/2023: Today - follow up Patient presents today for overdue follow up with her husband and caregiver. She's had more difficulties with her breathing over the past six months to a year. She has chest tightness, wheezing. Feels short winded with most activities. She does cough at night too. Doesn't produce phlegm. She does have an albuterol inhaler, which helps. She's not had her Dulera in quite some time. She was previously on budesonide nebs and felt like these worked the best for her. She has difficulties coordinating the inhalers. She's not had any steroids or antibiotics. No fevers, chills, hemoptysis, leg swelling, orthopnea, CP, palpitations. She does have chronic reflux symptoms. She's on pantoprazole and pepcid already. No dysphagia, changes in bowel habits, abd pain. She does have a GI specialist - hasn't notified them.  She also has trouble with her sleep at night. Her  husband says she has gasping, snoring. She's always tired. Struggles with morning headaches/migraines. No sleep parasomnias/paralysis, or drowsy driving. She has tried CPAP before but had trouble with insurance so no longer has one. She's interested in options for her sleep apnea. Weight is relatively stable compared to her last HST.    Allergies  Allergen Reactions   Metrizamide Rash and Swelling    Hands swelling/rash   Shellfish Allergy Swelling    Swells tongue   Amoxapine Nausea Only   Amoxicillin Other (See Comments)    WHAT IS THE REACTION?   Other Itching and Swelling    Shingles vaccine   Penicillin G Other (See Comments)    WHAT IS THE REACTION?   Zoster Vaccine Recombinant, Adjuvanted    Codeine Itching   Hydrocodone Itching and Rash   Ivp Dye [Iodinated Contrast Media] Rash    Hands swelling/rash   Latex Rash    Immunization History  Administered Date(s) Administered   Influenza Split 12/06/2009   Influenza,inj,Quad PF,6+ Mos 10/12/2016, 10/22/2017, 10/25/2018, 11/10/2021   Influenza-Unspecified 03/08/2017, 01/02/2023   Moderna SARS-COV2 Booster Vaccination 06/18/2020   Moderna Sars-Covid-2 Vaccination 04/24/2019, 05/21/2019   PNEUMOCOCCAL CONJUGATE-20 03/14/2022   Pneumococcal Polysaccharide-23 10/22/2013    Past Medical History:  Diagnosis Date   Allergy    Anemia    Anxiety    Arthritis    neck   Arthritis    Asthma    Chronic kidney disease, stage 2 (mild) 01/21/2020   CSF leak    Diabetes mellitus without complication (HCC)  GERD (gastroesophageal reflux disease)    History of flexible sigmoidoscopy 1999   Normal to 60cm,   Hypertension    Neuromuscular disorder (HCC)    Ovarian cancer (HCC)    skin cancer   Panic attack    PTSD (post-traumatic stress disorder)    S/P endoscopy 03/2010   Nl, s/p 56-French Maloney dilator, biopsy benign   S/P endoscopy 2008   Mild gastritis   Seizures (HCC)    Sleep apnea    Substance abuse (HCC)     drug addiction    Tobacco History: Social History   Tobacco Use  Smoking Status Every Day   Current packs/day: 0.50   Average packs/day: 0.3 packs/day for 38.9 years (10.0 ttl pk-yrs)   Types: Cigarettes   Start date: 05/03/1984   Passive exposure: Current  Smokeless Tobacco Never  Tobacco Comments   Patient states she is smoking based off her mood.    Ready to quit: Not Answered Counseling given: Not Answered Tobacco comments: Patient states she is smoking based off her mood.    Outpatient Medications Prior to Visit  Medication Sig Dispense Refill   albuterol (VENTOLIN HFA) 108 (90 Base) MCG/ACT inhaler Inhale into the lungs every 6 (six) hours as needed for wheezing or shortness of breath.     AUSTEDO XR 36 MG TB24 Take 1 tablet by mouth daily.     busPIRone (BUSPAR) 10 MG tablet Take 10 mg by mouth 3 (three) times daily.     cetirizine (ZYRTEC) 10 MG tablet Take 1 tablet (10 mg total) by mouth every morning. 90 tablet 3   EPINEPHrine 0.15 MG/0.15ML IJ injection Inject 0.15 mg into the muscle as needed for anaphylaxis.     escitalopram (LEXAPRO) 5 MG tablet Take 5 mg by mouth daily.     famotidine (PEPCID) 20 MG tablet Take 1 tablet (20 mg total) by mouth at bedtime. 90 tablet 5   lamoTRIgine (LAMICTAL) 200 MG tablet Take 1 tablet (200 mg total) by mouth 2 (two) times daily. 60 tablet 3   metFORMIN (GLUCOPHAGE) 500 MG tablet Take 500 mg by mouth daily with breakfast.     montelukast (SINGULAIR) 10 MG tablet TAKE 1 TABLET BY MOUTH DAILY AT BEDTIME 90 tablet 2   oxybutynin (DITROPAN) 5 MG tablet Take 5 mg by mouth 3 (three) times daily.     OZEMPIC, 1 MG/DOSE, 4 MG/3ML SOPN Inject into the skin.     pantoprazole (PROTONIX) 40 MG tablet TAKE 1 TABLET BY MOUTH TWICE A DAY BEFORE MEALS 180 tablet 2   QUEtiapine (SEROQUEL XR) 200 MG 24 hr tablet Take 200 mg by mouth at bedtime.     Rimegepant Sulfate (NURTEC) 75 MG TBDP Take 1 tab at onset of migraine.  May repeat in 2 hrs, if  needed.  Max dose: 2 tabs/day. This is a 30 day prescription. 16 tablet 11   rosuvastatin (CRESTOR) 20 MG tablet Take 20 mg by mouth daily.     SUMAtriptan (IMITREX) 50 MG tablet May repeat in 2 hours if headache persists or recurs. 10 tablet 5   traZODone (DESYREL) 50 MG tablet Take 50 mg by mouth at bedtime.     Vitamin D, Ergocalciferol, (DRISDOL) 1.25 MG (50000 UT) CAPS capsule Take 1 capsule (50,000 Units total) by mouth every 7 (seven) days. 5 capsule 11   No facility-administered medications prior to visit.     Review of Systems:   Constitutional: No weight loss or gain, night sweats, fevers, chills,  or lassitude. +fatigue  HEENT: No headaches, difficulty swallowing, tooth/dental problems, or sore throat. No sneezing, itching, ear ache, nasal congestion, or post nasal drip CV:  No chest pain, orthopnea, PND, swelling in lower extremities, anasarca, dizziness, palpitations, syncope Resp: +snoring, shortness of breath with exertion; nocturnal cough; wheezing. No excess mucus or change in color of mucus. No hemoptysis. No chest wall deformity GI:  + heartburn, indigestion. No abdominal pain, nausea, vomiting, diarrhea, change in bowel habits, loss of appetite, bloody stools.  GU: No dysuria, change in color of urine, urgency or frequency.   Skin: No rash, lesions, ulcerations MSK:  +chronic joint pains  Neuro: No changes in memory  Psych: No depression or anxiety. Mood stable. +sleep disturbance     Physical Exam:  BP 110/78 (BP Location: Right Arm, Patient Position: Sitting, Cuff Size: Large)   Pulse 91   Ht 5\' 6"  (1.676 m)   Wt 251 lb 3.2 oz (113.9 kg)   SpO2 95%   BMI 40.54 kg/m   GEN: Pleasant, interactive, well-kempt; obese; in no acute distress HEENT:  Normocephalic and atraumatic. PERRLA. Sclera white. Nasal turbinates pink, moist and patent bilaterally. No rhinorrhea present. Oropharynx pink and moist, without exudate or edema. No lesions, ulcerations, or postnasal  drip. Mallampati III NECK:  Supple w/ fair ROM. No JVD present. Thyroid symmetrical with no goiter or nodules palpated. No lymphadenopathy.   CV: RRR, no m/r/g, no peripheral edema. Pulses intact, +2 bilaterally. No cyanosis, pallor or clubbing. PULMONARY:  Unlabored, regular breathing. Clear bilaterally A&P w/o wheezes/rales/rhonchi. No accessory muscle use.  GI: BS present and normoactive. Soft, non-tender to palpation. No organomegaly or masses detected.  MSK: No erythema, warmth or tenderness. Cap refil <2 sec all extrem. Neuro: A/Ox3. No focal deficits noted.   Skin: Warm, no lesions or rashe Psych: Normal affect and behavior. Judgement and thought content appropriate.     Lab Results:  CBC    Component Value Date/Time   WBC 7.1 03/14/2022 0217   RBC 5.07 03/14/2022 0217   HGB 11.9 03/14/2022 0217   HGB 12.5 04/16/2018 1153   HCT 37.8 03/14/2022 0217   HCT 39.2 04/16/2018 1153   PLT 285 03/14/2022 0217   PLT 308 04/16/2018 1153   MCV 74.6 (L) 03/14/2022 0217   MCV 74 (L) 04/16/2018 1153   MCH 23.5 (L) 03/14/2022 0217   MCHC 31.5 (L) 03/14/2022 0217   RDW 15.7 (H) 03/14/2022 0217   RDW 16.0 (H) 04/16/2018 1153   LYMPHSABS 2.1 06/01/2020 1224   LYMPHSABS 2.8 04/16/2018 1153   MONOABS 0.5 06/01/2020 1224   EOSABS 0.1 06/01/2020 1224   EOSABS 0.1 04/16/2018 1153   BASOSABS 0.1 06/01/2020 1224   BASOSABS 0.1 04/16/2018 1153    BMET    Component Value Date/Time   NA 141 03/14/2022 0217   NA 140 02/07/2018 1011   K 4.3 03/14/2022 0217   K 4.1 02/23/2012 0000   CL 107 03/14/2022 0217   CO2 25 03/14/2022 0217   GLUCOSE 98 03/14/2022 0217   BUN 20 03/14/2022 0217   BUN 13 02/07/2018 1011   CREATININE 1.19 (H) 03/14/2022 0217   CALCIUM 9.4 03/14/2022 0217   CALCIUM 8.9 02/23/2012 0000   GFRNONAA >60 06/01/2020 1224   GFRNONAA 61 10/12/2016 1517   GFRAA 57 (L) 02/07/2018 1011   GFRAA 71 10/12/2016 1517    BNP No results found for: "BNP"   Imaging:  DG FL  GUIDED LUMBAR PUNCTURE Result Date: 04/02/2023 CLINICAL DATA:  Idiopathic intracranial hypertension. Recurrent headaches. Previous lumbar puncture last May. EXAM: DIAGNOSTIC LUMBAR PUNCTURE UNDER FLUOROSCOPIC GUIDANCE COMPARISON:  Lumbar puncture dated Jun 14, 2022. FLUOROSCOPY: Radiation Exposure Index (as provided by the fluoroscopic device): 10.2 mGy Kerma PROCEDURE: Informed consent was obtained from the patient prior to the procedure, including potential complications of headache, allergy, and pain. With the patient in the left lateral decubitus position, the lower back was prepped with Betadine. 1% Lidocaine was used for local anesthesia. Lumbar puncture was performed at the L2-L3 level via a left interlaminar approach using a 6 inch 20 gauge needle with return of clear, colorless CSF with an opening pressure of 28 cm water. 20 ml of CSF were obtained for laboratory studies. Closing pressure was 12 cm water. The patient tolerated the procedure well and there were no apparent complications. IMPRESSION: Technically successful fluoroscopically guided lumbar puncture with normalization of elevated opening CSF pressure as described above. Electronically Signed   By: Obie Dredge M.D.   On: 04/02/2023 12:40    methylPREDNISolone acetate (DEPO-MEDROL) injection 40 mg     Date Action Dose Route User   02/15/2023 1051 Given 40 mg Intra-articular Littrell, Amy W, RT      methylPREDNISolone acetate (DEPO-MEDROL) injection 40 mg     Date Action Dose Route User   02/15/2023 1051 Given 40 mg Intra-articular Littrell, Amy W, RT          Latest Ref Rng & Units 06/29/2020   11:06 AM 04/15/2014    8:44 AM  PFT Results  FVC-Pre L 2.69  2.46   FVC-Predicted Pre % 85  75   FVC-Post L 2.98  2.58   FVC-Predicted Post % 94  79   Pre FEV1/FVC % % 83  96   Post FEV1/FCV % % 85  96   FEV1-Pre L 2.24  2.37   FEV1-Predicted Pre % 88  89   FEV1-Post L 2.52  2.47   DLCO uncorrected ml/min/mmHg 21.59  16.30    DLCO UNC% % 95  60   DLCO corrected ml/min/mmHg 21.59  16.30   DLCO COR %Predicted % 95  60   DLVA Predicted % 111  85   TLC L 4.87  4.39   TLC % Predicted % 90  82   RV % Predicted % 74  149     Lab Results  Component Value Date   NITRICOXIDE 15 06/29/2020        Assessment & Plan:   Poorly controlled persistent asthma Similar symptoms a year ago with increased DOE; however, now with nocturnal cough and bronchospasm per her report. Insidious onset. ACT 10 indicating poor control. Does not appear to be in acute exacerbation. Lung exam clear today. She is not currently on any controller therapies. Will have her resume scheduled ICS with budesonide nebs bid and add on duonebs PRN. Has difficulties with inhalers. Side effect profile reviewed. Understands proper use and role of maintenance therapy. Will reassess response at follow up. CXR today. Action plan in place.  Suspect a component of her cough is related to GERD, which could also be exacerbating her asthma. Encouraged her to schedule an appt with GI to discuss therapies. She's already on PPI and H2 blocker. Red flag symptoms reviewed.   Patient Instructions  Continue Albuterol inhaler 2 puffs or duoneb 3 mL neb every 6 hours as needed for shortness of breath or wheezing. Notify if symptoms persist despite rescue inhaler/neb use. Use duoneb Twice daily with your budesonide  Restart budesonide 2 mL neb treatment Twice daily. Brush tongue and rinse mouth afterwards  Continue cetirizine 1 tab daily for allergies  Talk to your GI doctor about the reflux trouble you've been having  Given your symptoms, I am concerned that you may have sleep disordered breathing with sleep apnea. You will need a sleep study for further evaluation. Someone will contact you to schedule this.   We discussed how untreated sleep apnea puts an individual at risk for cardiac arrhthymias, pulm HTN, DM, stroke and increases their risk for daytime accidents. We  also briefly reviewed treatment options including weight loss, side sleeping position, oral appliance, CPAP therapy or referral to ENT for possible surgical options  Use caution when driving and pull over if you become sleepy.  Asthma action plan: START nebs up to four times a day for worsening shortness of breath, wheezing and cough. If you symptoms do not improve in 24-48 hours, contact us for steroid course. If your symptoms rapidly worsen, you have trouble talking or extreme difficulty breathing, or you're not getting relief from your nebulizer/rescue, go to the emergency department.  Chest x ray today   Follow up in 6-8 weeks with Dr. Wynona Neat (new pt 30 min slot) or Katie Tishia Maestre,NP to go over sleep study results and check on asthma. If symptoms do not improve or worsen, please contact office for sooner follow up or seek emergency care.      GASTROESOPHAGEAL REFLUX DISEASE, CHRONIC See above. No red flag symptoms.   DOE (dyspnea on exertion) Insidious onset. See above. Suspect it's multifactorial related to asthma, deconditioning, body habitus, component of fatigue contributing possibly due to untreated OSA.   OSA (obstructive sleep apnea) Hx of mild OSA. They tell me she tolerated CPAP but had difficulties with affordability. Persistent symptoms of OSA. She has snoring, excessive daytime sleepiness, restless sleep. BMI 40. Given this,  I am concerned she still has sleep disordered breathing with obstructive sleep apnea. She will need repeat sleep study for further evaluation given length of time since last study. Can determine therapy options after   - discussed how weight can impact sleep and risk for sleep disordered breathing - discussed options to assist with weight loss: combination of diet modification, cardiovascular and strength training exercises   - had an extensive discussion regarding the adverse health consequences related to untreated sleep disordered breathing -  specifically discussed the risks for hypertension, coronary artery disease, cardiac dysrhythmias, cerebrovascular disease, and diabetes - lifestyle modification discussed   - discussed how sleep disruption can increase risk of accidents, particularly when driving - safe driving practices were discussed    Advised if symptoms do not improve or worsen, to please contact office for sooner follow up or seek emergency care.   I spent 35 minutes of dedicated to the care of this patient on the date of this encounter to include pre-visit review of records, face-to-face time with the patient discussing conditions above, post visit ordering of testing, clinical documentation with the electronic health record, making appropriate referrals as documented, and communicating necessary findings to members of the patients care team.  Noemi Chapel, NP 04/04/2023  Pt aware and understands NP's role.

## 2023-04-04 NOTE — Assessment & Plan Note (Signed)
 See above. No red flag symptoms.

## 2023-04-04 NOTE — Assessment & Plan Note (Signed)
 Similar symptoms a year ago with increased DOE; however, now with nocturnal cough and bronchospasm per her report. Insidious onset. ACT 10 indicating poor control. Does not appear to be in acute exacerbation. Lung exam clear today. She is not currently on any controller therapies. Will have her resume scheduled ICS with budesonide nebs bid and add on duonebs PRN. Has difficulties with inhalers. Side effect profile reviewed. Understands proper use and role of maintenance therapy. Will reassess response at follow up. CXR today. Action plan in place.  Suspect a component of her cough is related to GERD, which could also be exacerbating her asthma. Encouraged her to schedule an appt with GI to discuss therapies. She's already on PPI and H2 blocker. Red flag symptoms reviewed.   Patient Instructions  Continue Albuterol inhaler 2 puffs or duoneb 3 mL neb every 6 hours as needed for shortness of breath or wheezing. Notify if symptoms persist despite rescue inhaler/neb use. Use duoneb Twice daily with your budesonide  Restart budesonide 2 mL neb treatment Twice daily. Brush tongue and rinse mouth afterwards  Continue cetirizine 1 tab daily for allergies  Talk to your GI doctor about the reflux trouble you've been having  Given your symptoms, I am concerned that you may have sleep disordered breathing with sleep apnea. You will need a sleep study for further evaluation. Someone will contact you to schedule this.   We discussed how untreated sleep apnea puts an individual at risk for cardiac arrhthymias, pulm HTN, DM, stroke and increases their risk for daytime accidents. We also briefly reviewed treatment options including weight loss, side sleeping position, oral appliance, CPAP therapy or referral to ENT for possible surgical options  Use caution when driving and pull over if you become sleepy.  Asthma action plan: START nebs up to four times a day for worsening shortness of breath, wheezing and cough. If  you symptoms do not improve in 24-48 hours, contact us for steroid course. If your symptoms rapidly worsen, you have trouble talking or extreme difficulty breathing, or you're not getting relief from your nebulizer/rescue, go to the emergency department.  Chest x ray today   Follow up in 6-8 weeks with Dr. Wynona Neat (new pt 30 min slot) or Katie Audrinna Sherman,NP to go over sleep study results and check on asthma. If symptoms do not improve or worsen, please contact office for sooner follow up or seek emergency care.

## 2023-04-04 NOTE — Assessment & Plan Note (Signed)
 Hx of mild OSA. They tell me she tolerated CPAP but had difficulties with affordability. Persistent symptoms of OSA. She has snoring, excessive daytime sleepiness, restless sleep. BMI 40. Given this,  I am concerned she still has sleep disordered breathing with obstructive sleep apnea. She will need repeat sleep study for further evaluation given length of time since last study. Can determine therapy options after   - discussed how weight can impact sleep and risk for sleep disordered breathing - discussed options to assist with weight loss: combination of diet modification, cardiovascular and strength training exercises   - had an extensive discussion regarding the adverse health consequences related to untreated sleep disordered breathing - specifically discussed the risks for hypertension, coronary artery disease, cardiac dysrhythmias, cerebrovascular disease, and diabetes - lifestyle modification discussed   - discussed how sleep disruption can increase risk of accidents, particularly when driving - safe driving practices were discussed

## 2023-04-04 NOTE — Assessment & Plan Note (Signed)
 Insidious onset. See above. Suspect it's multifactorial related to asthma, deconditioning, body habitus, component of fatigue contributing possibly due to untreated OSA.

## 2023-04-06 ENCOUNTER — Telehealth: Payer: Self-pay | Admitting: Nurse Practitioner

## 2023-04-06 NOTE — Telephone Encounter (Signed)
 Patient went to primary care  doctor and the doctor found two lumps in her throat. She has been having dry mouth lately and her primary care doctor advised she let her pulmonary doctor know. Patient can be reached at [phone]

## 2023-04-06 NOTE — Telephone Encounter (Signed)
 I called and spoke with Jennifer [name]. Jennifer [name] states her mouth is dry and will gag. Jennifer [name] saw pcp yesterday and Pcp checked neck and felt 2 knots, 1 on each side. Pcp told Jennifer [name] to see Pulmonary about this. Jennifer [name] states if she needed any kind of xray she prefers [facility].   Jennifer [name] is a former Dr Craige Cotta Jennifer [name], last seen by Florentina Addison

## 2023-04-06 NOTE — Telephone Encounter (Signed)
 I called and spoke with pt. I informed pt of Katie's note. Pt verbalized understanding. Nfn

## 2023-04-06 NOTE — Telephone Encounter (Signed)
 They're likely lymph nodes if they're located in her neck. That should be followed by her PCP, not [place]. Could be related to viral illness or nasal drainage. She can follow up with them regarding this.  Dry mouth can also be managed by her PCP. She can try biotene mouth rinses. If she develops mouth sores with budesonide use, she could have thrush but dryness is not an indicator of this. She needs to make sure she cleans her mouth well after the nebulizer treatments. Thanks.

## 2023-04-09 ENCOUNTER — Telehealth: Payer: Self-pay

## 2023-04-09 NOTE — Telephone Encounter (Signed)
 Asking Sarah for sooner Appointment (KRS) 04/11/23 @ 2:15pm VV 04/18/23 @ 4:00pm VV

## 2023-04-09 NOTE — Telephone Encounter (Signed)
 Pt returned call. Please call back when available.

## 2023-04-09 NOTE — Telephone Encounter (Signed)
 Called pt back to ask per Dr. Terrace Arabia if she is doing better since getting her lumbar puncture and have her symptoms improved. Pt stated that her balance is still off. Pt states that she is still having headaches. Pt states that when she turns her eyes she feels a sharp pain in her head. Pt stated that the Sumatriptan does help some with her symptoms. Told pt that Dr. Terrace Arabia will be notified and will let her know what Dr. Terrace Arabia advise.

## 2023-04-09 NOTE — Telephone Encounter (Signed)
 Tried to call pt, was unable to LVM due to mailbox being full. Will try again later.

## 2023-04-09 NOTE — Telephone Encounter (Signed)
 On schedule with Huntley Dec for August, it is ok to move up to her next available

## 2023-04-10 ENCOUNTER — Telehealth: Admitting: Neurology

## 2023-04-10 ENCOUNTER — Telehealth: Payer: Self-pay | Admitting: Student

## 2023-04-10 DIAGNOSIS — G9600 Cerebrospinal fluid leak, unspecified: Secondary | ICD-10-CM | POA: Diagnosis not present

## 2023-04-10 DIAGNOSIS — R569 Unspecified convulsions: Secondary | ICD-10-CM | POA: Diagnosis not present

## 2023-04-10 DIAGNOSIS — G932 Benign intracranial hypertension: Secondary | ICD-10-CM | POA: Diagnosis not present

## 2023-04-10 DIAGNOSIS — G43709 Chronic migraine without aura, not intractable, without status migrainosus: Secondary | ICD-10-CM

## 2023-04-10 DIAGNOSIS — R519 Headache, unspecified: Secondary | ICD-10-CM

## 2023-04-10 DIAGNOSIS — G40909 Epilepsy, unspecified, not intractable, without status epilepticus: Secondary | ICD-10-CM

## 2023-04-10 MED ORDER — RIZATRIPTAN BENZOATE 10 MG PO TBDP: 10.0000 mg | ORAL_TABLET | ORAL | 11 refills | Status: DC | PRN

## 2023-04-10 MED ORDER — TOPIRAMATE 100 MG PO TABS: 100.0000 mg | ORAL_TABLET | Freq: Two times a day (BID) | ORAL | 5 refills | Status: DC

## 2023-04-10 NOTE — Telephone Encounter (Signed)
 [employer] surveyor.He can not come tomorrow to get her HST box. Please call him  to resched.   [phone] Rushie Nyhan

## 2023-04-10 NOTE — Telephone Encounter (Signed)
 Call to patient, she is in agreement to VV at [phone] today per Maralyn Sago. She is setting up her mychart account. Help desk number provided

## 2023-04-10 NOTE — Patient Instructions (Addendum)
 Start Topamax working up to 100 mg twice daily, start taking 50 mg twice daily for 1-2 weeks, then increase to 100 mg twice daily  Stop the Imitrex, try Maxalt melt for acute headache   Follow up with your eye doctor for review of eye exam post lumbar puncture   Recommend weight loss for IIH management long term   Follow up with me in 6 months

## 2023-04-10 NOTE — Progress Notes (Signed)
 Virtual Visit via Video Note  I connected with Norval Morton on 04/10/23 at  3:45 PM EST by a video enabled telemedicine application and verified that I am speaking with the correct person using two identifiers.  Location: Patient: at her home  Provider: in the office    I discussed the limitations of evaluation and management by telemedicine and the availability of in person appointments. The patient expressed understanding and agreed to proceed.  ASSESSMENT AND PLAN 53 y.o. year old female   History of seizure 2.   History of idiopathic intracranial hypertension 3.   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 on Nov 28 2022, which did stop her nasal leakage However she continue to have frequent headaches LP Feb 2025 opening pressure 28 cm water, May 2024 opening pressure 28 cm water Continue with frequent headache, seems daily for last 2 days, but had improved post LP Start Topamax working up to 100 mg twice a day Referral to ophthalmology for reevaluation post LP Discussed the importance of weight loss No seizures, continue Lamictal 200 mg twice a day Maxalt melt 10 mg as needed for acute headache (previously tried and failed Imitrex), will consider Nurtec next Keep follow-up appointment in August with me  [facility] DSS case worker was at visit [place], asked we contact him to arrange transportation, scheduling [phone]  Meds ordered this encounter  Medications   topiramate (TOPAMAX) 100 MG tablet    Sig: Take 1 tablet (100 mg total) by mouth 2 (two) times daily.    Dispense:  60 tablet    Refill:  5   rizatriptan (MAXALT-MLT) 10 MG disintegrating tablet    Sig: Take 1 tablet (10 mg total) by mouth as needed for migraine. May repeat in 2 hours if needed    Dispense:  9  tablet    Refill:  11    Data reviewed Ophthalmology evaluation from [facility] on February 06, 2023 pseudopapillary edema.   HISTORY  JOSEPHINE WOOLDRIDGE is a 53 year old female, accompanied by her husband, seen in request by primary care nurse practitioner Bucio, Julian Reil, to continue her neurological care at our clinic, initial evaluation was on January 26, 2022, she was previously seen by [facility] neurologist Dr. Gerilyn Pilgrim, who has closed his neurological practice   I reviewed and summarized the referring note. PMHX, extensive records from Dr.Doonquah's office Bipolar, on polypharmacy Ovarian Cancer was treated in 2000,  DM HTN HLD PTSD  History of substance abuse, cocaine in young, quit for many years. Smoke,   She had seizure, 2015, frequent seizure spells with a couple years of span, in the setting of extreme stress, preceding by headache, confusion, feeling nervous, mild bilateral hands tremor, then would involve into whole-body tremor, sometimes with bowel incontinence, lasting 10 to 15 minutes, she was treated on Keppra slow titrating dose now 750 mg 4 tablets every night, does complains of side effect, also feeling anxious   Last reported seizure activity in 2015   She carries a diagnosis of bipolar disorder, PTSD, on polypharmacy for her mood disorder, this include lamotrigine 200 mg every night, Lexapro 5 mg daily, Seroquel 200 mg every night   She also has a history of chronic low back pain, frequent flareup, most recent flareup was few weeks ago, she get up  after bending over to pick up her telephone cord, she felt her lower back has shifted, sharp radiating pain, she could not move for few minutes, then was able to walk, ever since then, she complains of worsening right-sided low back pain, radiating pain to right hip, gait abnormality, worsening pain bearing weight   She had long history of diabetes, no complaints of bilateral feet numbness tingling, urinary urgency,  frequency,   She also complains of worsening headache, retro-orbital area, sometimes occipital region, shooting pain from front to back  UPDATE Feb 4th 2025: She was seen by [facility] in August 2024,  CT showed Small volume fluid and mucosal thickening along the left olfactory cleft underlying the cribriform plate , ethmoidal csf leakage on the termporal bone  Nasal endoscopy:  8mm area of submucosal fluid noted along perpendicular plate of the ethmoid on the left within the olfactory cleft without active extravasation of CSF  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    Post surgical follow up on Jan 17 2023, nasal endoscopy exam showed no significant abnormality  She no longer has nasal discharge, however continue have frequent headaches, transient left blurry vision, sharp pain to her left eye,  Recently also had bilateral knee joint injection for bilateral knee pain, had bilateral knee injection, her gait complaints most likely related to her knee issues  Update April 10, 2023 SS: LP 04/02/23, opening pressure 28 cm water. Dr. Terrace Arabia ordered Nurtec, but insurance requires 2 triptans. Currently taking Imitrex 50 mg, not helping. Reports no change in headache since LP. Continues with frontal headache, pain behind both eyes, sharp pain. Constant pain for last 2 days. Sometimes feel nauseated. No other neuro symptoms. Continues with some mild gait instability. Remains on Lamictal 200 mg BID, no seizures.   PHYSICAL EXAMNIATION:    04/04/2023   11:17 AM 04/02/2023   11:50 AM 03/13/2023    3:20 PM  Vitals with BMI  Height 5\' 6"   5\' 6"   Weight 251 lbs 3 oz  244 lbs  BMI 40.56  39.4  Systolic 110 130 161  Diastolic 78 72 73  Pulse 91 68 79     Gen: NAD, conversant, well nourised, well groomed      Via video visit, is seated on the couch, wearing her night gown, speech is clear and concise, facial symmetry noted, follows commands                   REVIEW OF SYSTEMS: Out of a complete 14 system review of symptoms, the patient complains only of the following symptoms, and all other reviewed systems are negative.  See HPI  ALLERGIES: Allergies  Allergen Reactions   Metrizamide Rash and Swelling    Hands swelling/rash   Shellfish Allergy Swelling    Swells tongue   Amoxapine Nausea Only   Amoxicillin Other (See Comments)    WHAT IS THE REACTION?   Other Itching and Swelling    Shingles vaccine   Penicillin G Other (See Comments)    WHAT IS THE REACTION?   Zoster Vaccine Recombinant, Adjuvanted    Codeine Itching   Hydrocodone Itching and Rash   Ivp Dye [Iodinated Contrast Media] Rash    Hands swelling/rash   Latex Rash    HOME MEDICATIONS: Outpatient Medications Prior to Visit  Medication Sig Dispense Refill   albuterol (VENTOLIN HFA) 108 (90 Base) MCG/ACT inhaler Inhale into the lungs every 6 (six) hours  as needed for wheezing or shortness of breath.     AUSTEDO XR 36 MG TB24 Take 1 tablet by mouth daily.     budesonide (PULMICORT) 0.5 MG/2ML nebulizer solution Take 2 mLs (0.5 mg total) by nebulization 2 (two) times daily. 120 mL 11   busPIRone (BUSPAR) 10 MG tablet Take 10 mg by mouth 3 (three) times daily.     cetirizine (ZYRTEC) 10 MG tablet Take 1 tablet (10 mg total) by mouth every morning. 90 tablet 3   EPINEPHrine 0.15 MG/0.15ML IJ injection Inject 0.15 mg into the muscle as needed for anaphylaxis.     escitalopram (LEXAPRO) 5 MG tablet Take 5 mg by mouth daily.     famotidine (PEPCID) 20 MG tablet Take 1 tablet (20 mg total) by mouth at bedtime. 90 tablet 5   ipratropium-albuterol (DUONEB) 0.5-2.5 (3) MG/3ML SOLN Take 3 mLs by nebulization every 6 (six) hours as needed (shortness of breath, wheezing). 360 mL 5   lamoTRIgine (LAMICTAL) 200 MG tablet Take 1 tablet (200 mg total) by mouth 2 (two) times daily. 60 tablet 3   metFORMIN (GLUCOPHAGE) 500 MG tablet Take 500 mg by mouth daily with breakfast.      montelukast (SINGULAIR) 10 MG tablet TAKE 1 TABLET BY MOUTH DAILY AT BEDTIME 90 tablet 2   oxybutynin (DITROPAN) 5 MG tablet Take 5 mg by mouth 3 (three) times daily.     OZEMPIC, 1 MG/DOSE, 4 MG/3ML SOPN Inject into the skin.     pantoprazole (PROTONIX) 40 MG tablet TAKE 1 TABLET BY MOUTH TWICE A DAY BEFORE MEALS 180 tablet 2   QUEtiapine (SEROQUEL XR) 200 MG 24 hr tablet Take 200 mg by mouth at bedtime.     Rimegepant Sulfate (NURTEC) 75 MG TBDP Take 1 tab at onset of migraine.  May repeat in 2 hrs, if needed.  Max dose: 2 tabs/day. This is a 30 day prescription. 16 tablet 11   rosuvastatin (CRESTOR) 20 MG tablet Take 20 mg by mouth daily.     SUMAtriptan (IMITREX) 50 MG tablet May repeat in 2 hours if headache persists or recurs. 10 tablet 5   traZODone (DESYREL) 50 MG tablet Take 50 mg by mouth at bedtime.     Vitamin D, Ergocalciferol, (DRISDOL) 1.25 MG (50000 UT) CAPS capsule Take 1 capsule (50,000 Units total) by mouth every 7 (seven) days. 5 capsule 11   No facility-administered medications prior to visit.    PAST MEDICAL HISTORY: Past Medical History:  Diagnosis Date   Allergy    Anemia    Anxiety    Arthritis    neck   Arthritis    Asthma    Chronic kidney disease, stage 2 (mild) 01/21/2020   CSF leak    Diabetes mellitus without complication (HCC)    GERD (gastroesophageal reflux disease)    History of flexible sigmoidoscopy 1999   Normal to 60cm,   Hypertension    Neuromuscular disorder (HCC)    Ovarian cancer (HCC)    skin cancer   Panic attack    PTSD (post-traumatic stress disorder)    S/P endoscopy 03/2010   Nl, s/p 56-French Maloney dilator, biopsy benign   S/P endoscopy 2008   Mild gastritis   Seizures (HCC)    Sleep apnea    Substance abuse (HCC)    drug addiction    PAST SURGICAL HISTORY: Past Surgical History:  Procedure Laterality Date   ABDOMINAL HYSTERECTOMY     ovarian tumors   CHOLECYSTECTOMY  COLONOSCOPY  03/01/2011   Rourk-External  hemorrhoids/otherwise normal rectum and colon.   COLONOSCOPY WITH PROPOFOL N/A 08/08/2018   entire examined colon is normal. The distal rectum and anal verge are normal on retroflexion view. Repeat in 5 years.    ESOPHAGOGASTRODUODENOSCOPY  2008   gastritis   ESOPHAGOGASTRODUODENOSCOPY  2012   Moderate sized hiatal hernia, non-H. pylori gastritis   ESOPHAGOGASTRODUODENOSCOPY N/A 11/17/2013   RMR: Mild erosive reflux esophagitis. Patulous EG junction   ESOPHAGOGASTRODUODENOSCOPY (EGD) WITH PROPOFOL N/A 08/08/2018   Erosive reflux esophagitis. Dilated. Small hiatal hernia. Otherwise normal.    ESOPHAGOGASTRODUODENOSCOPY (EGD) WITH PROPOFOL N/A 06/02/2021   Procedure: ESOPHAGOGASTRODUODENOSCOPY (EGD) WITH PROPOFOL;  Surgeon: Corbin Ade, MD;  [facility];  Service: Endoscopy;  Laterality: N/A;  2:00pm   HERNIA REPAIR     Incisional with mesh   KNEE ARTHROSCOPY     left knee surgery in [place]   MALONEY DILATION  08/08/2018   Procedure: MALONEY DILATION;  Surgeon: Corbin Ade, MD;  Location: [facility];  Service: Endoscopy;;   MALONEY DILATION N/A 06/02/2021   Procedure: Alvy Beal;  Surgeon: Corbin Ade, MD;  Location: [facility];  Service: Endoscopy;  Laterality: N/A;   PARTIAL HYSTERECTOMY     SKIN CANCER EXCISION     left bicep   TONSILLECTOMY      FAMILY HISTORY: Family History  Problem Relation Age of Onset   Crohn's disease Maternal Aunt    Breast cancer Maternal Grandmother    Cancer Maternal Grandmother    Colon cancer Maternal Grandfather        78   Pancreatic cancer Maternal Grandfather    Cancer Maternal Grandfather    Crohn's disease Cousin        x 2 cousins   Breast cancer Daughter 69   Arthritis Mother    COPD Mother    Depression Mother    Drug abuse Mother    Heart disease Mother    Hypertension Mother    Hyperlipidemia Mother    Learning disabilities Mother    Mental illness Mother    Alcohol abuse Mother    Colon polyps  Mother 38       tubular adenoma   Early death Father 109       murder   Alcohol abuse Father    Drug abuse Father    Learning disabilities Father    Cancer Paternal Grandmother    Alcohol abuse Paternal Grandmother    Cancer Paternal Grandfather    Alcohol abuse Maternal Uncle    Alcohol abuse Paternal Uncle    Drug abuse Paternal Uncle     SOCIAL HISTORY: Social History   Socioeconomic History   Marital status: Married    Spouse name: Chrissie Noa   Number of children: Not on file   Years of education: Not on file   Highest education level: 11th grade  Occupational History   Occupation: disabled    Associate Professor: NOT EMPLOYED  Tobacco Use   Smoking status: Every Day    Current packs/day: 0.50    Average packs/day: 0.3 packs/day for 38.9 years (10.0 ttl pk-yrs)    Types: Cigarettes    Start date: 05/03/1984    Passive exposure: Current   Smokeless tobacco: Never   Tobacco comments:    Patient states she is smoking based off her mood.   Vaping Use   Vaping status: Never Used  Substance and Sexual Activity   Alcohol use: Not Currently  Drug use: Not Currently    Types: Marijuana   Sexual activity: Yes    Birth control/protection: Surgical  Other Topics Concern   Not on file  Social History Narrative   Lives at home with her husband.   Right handed.    1-2 cups of coffee 3 times per wk.    Intermittent soda.   Started back smoking   Social Drivers of Corporate investment banker Strain: Low Risk  (11/29/2022)   Received from [facility]   Overall Financial Resource Strain (CARDIA)    Difficulty of Paying Living Expenses: Not hard at all  Food Insecurity: No Food Insecurity (11/29/2022)   [facility]   Hunger Vital Sign    Worried About Running Out of Food in the Last Year: Never true    Ran Out of Food in the Last Year: Never true  Transportation Needs: No Transportation Needs (11/29/2022)   Received from [facility] -  Transportation    Lack of Transportation (Medical): No    Lack of Transportation (Non-Medical): No  Physical Activity: Inactive (05/28/2019)   Exercise Vital Sign    Days of Exercise per Week: 0 days    Minutes of Exercise per Session: 0 min  Stress: Stress Concern Present (05/28/2019)   [employer] of Occupational Health - Occupational Stress Questionnaire    Feeling of Stress : To some extent  Social Connections: Moderately Integrated (05/28/2019)   Social Connection and Isolation Panel [NHANES]    Frequency of Communication with Friends and Family: More than three times a week    Frequency of Social Gatherings with Friends and Family: More than three times a week    Attends Religious Services: 1 to 4 times per year    Active Member of [employer] or Organizations: No    Attends Banker Meetings: Never    Marital Status: Married  Catering manager Violence: Not At Risk (05/28/2019)   Humiliation, Afraid, Rape, and Kick questionnaire    Fear of Current or Ex-Partner: No    Emotionally Abused: No    Physically Abused: No    Sexually Abused: No   PHYSICAL EXAM  There were no vitals filed for this visit.  There is no height or weight on file to calculate BMI.  Generalized: Well developed, in no acute distress  Neurological examination  Mentation: Alert oriented to time, place, history taking. Follows all commands speech and language fluent Cranial nerve II-XII: Pupils were equal round reactive to light. Extraocular movements were full, visual field were full on confrontational test. Facial sensation and strength were normal. Head turning and shoulder shrug  were normal and symmetric. Motor: The motor testing reveals 5 over 5 strength of all 4 extremities. Good symmetric motor tone is noted throughout.  Sensory: Sensory testing is intact to soft touch on all 4 extremities. No evidence of extinction is noted.  Coordination: Cerebellar testing reveals good finger-nose-finger  and heel-to-shin bilaterally.  Gait and station: Gait is steady, but husband walks with her for support Reflexes: Deep tendon reflexes are symmetric and normal bilaterally.   DIAGNOSTIC DATA (LABS, IMAGING, TESTING) - I reviewed patient records, labs, notes, testing and imaging myself where available.  Lab Results  Component Value Date   WBC 7.1 03/14/2022   HGB 11.9 03/14/2022   HCT 37.8 03/14/2022   MCV 74.6 (L) 03/14/2022   PLT 285 03/14/2022      Component Value Date/Time   NA  141 03/14/2022 0217   NA 140 02/07/2018 1011   K 4.3 03/14/2022 0217   K 4.1 02/23/2012 0000   CL 107 03/14/2022 0217   CO2 25 03/14/2022 0217   GLUCOSE 98 03/14/2022 0217   BUN 20 03/14/2022 0217   BUN 13 02/07/2018 1011   CREATININE 1.19 (H) 03/14/2022 0217   CALCIUM 9.4 03/14/2022 0217   CALCIUM 8.9 02/23/2012 0000   PROT 7.0 03/14/2022 0217   PROT 7.2 04/16/2018 1153   ALBUMIN 3.9 06/01/2020 1224   ALBUMIN 4.1 04/16/2018 1153   AST 31 03/14/2022 0217   AST 13 02/23/2012 0000   ALT 39 (H) 03/14/2022 0217   ALKPHOS 69 06/01/2020 1224   ALKPHOS 71 02/23/2012 0000   BILITOT 0.3 03/14/2022 0217   BILITOT 0.3 04/16/2018 1153   BILITOT 0.1 02/23/2012 0000   GFRNONAA >60 06/01/2020 1224   GFRNONAA 61 10/12/2016 1517   GFRAA 57 (L) 02/07/2018 1011   GFRAA 71 10/12/2016 1517   Lab Results  Component Value Date   CHOL 172 02/07/2018   HDL 49 02/07/2018   LDLCALC 108 (H) 02/07/2018   TRIG 74 02/07/2018   CHOLHDL 3.5 02/07/2018   Lab Results  Component Value Date   HGBA1C 5.4 02/07/2018   Lab Results  Component Value Date   VITAMINB12 321 04/11/2018   Lab Results  Component Value Date   TSH 1.750 02/07/2018    Otila Kluver, DNP  [facility] [address] [place], [place] [address] [phone]

## 2023-04-11 ENCOUNTER — Encounter

## 2023-04-11 ENCOUNTER — Telehealth: Payer: Self-pay

## 2023-04-11 NOTE — Telephone Encounter (Signed)
 Working on this order. Contacted pt left a voicemail with my directline to call me back to reschedule. Will call again in the afternoon. NFN

## 2023-04-11 NOTE — Telephone Encounter (Signed)
 Referral sent to [facility] [phone]

## 2023-04-14 NOTE — Progress Notes (Deleted)
 GI Office Note    Referring Provider: Shelby Dubin, FNP Primary Care Physician:  Shelby Dubin, FNP  Primary Gastroenterologist: Roetta Sessions, MD   Chief Complaint   No chief complaint on file.   History of Present Illness   Jennifer [name] is a 53 y.o. female presenting today for follow up. Last seen 10/2022. H/o bloating, constipation, GERD.       EGD 05/2021: -mild erosive reflux esophagitis. Dilated -small hiatal hernia -normal duodenal bulb and second portion of duodenum   Colonoscopy 08/2018: -normal exam -five year screening colonoscopy, due to FH   Medications   Current Outpatient Medications  Medication Sig Dispense Refill   albuterol (VENTOLIN HFA) 108 (90 Base) MCG/ACT inhaler Inhale into the lungs every 6 (six) hours as needed for wheezing or shortness of breath.     AUSTEDO XR 36 MG TB24 Take 1 tablet by mouth daily.     budesonide (PULMICORT) 0.5 MG/2ML nebulizer solution Take 2 mLs (0.5 mg total) by nebulization 2 (two) times daily. 120 mL 11   busPIRone (BUSPAR) 10 MG tablet Take 10 mg by mouth 3 (three) times daily.     cetirizine (ZYRTEC) 10 MG tablet Take 1 tablet (10 mg total) by mouth every morning. 90 tablet 3   EPINEPHrine 0.15 MG/0.15ML IJ injection Inject 0.15 mg into the muscle as needed for anaphylaxis.     escitalopram (LEXAPRO) 5 MG tablet Take 5 mg by mouth daily.     famotidine (PEPCID) 20 MG tablet Take 1 tablet (20 mg total) by mouth at bedtime. 90 tablet 5   ipratropium-albuterol (DUONEB) 0.5-2.5 (3) MG/3ML SOLN Take 3 mLs by nebulization every 6 (six) hours as needed (shortness of breath, wheezing). 360 mL 5   lamoTRIgine (LAMICTAL) 200 MG tablet Take 1 tablet (200 mg total) by mouth 2 (two) times daily. 60 tablet 3   metFORMIN (GLUCOPHAGE) 500 MG tablet Take 500 mg by mouth daily with breakfast.     montelukast (SINGULAIR) 10 MG tablet TAKE 1 TABLET BY MOUTH DAILY AT BEDTIME 90 tablet 2   oxybutynin (DITROPAN) 5 MG tablet Take 5  mg by mouth 3 (three) times daily.     OZEMPIC, 1 MG/DOSE, 4 MG/3ML SOPN Inject into the skin.     pantoprazole (PROTONIX) 40 MG tablet TAKE 1 TABLET BY MOUTH TWICE A DAY BEFORE MEALS 180 tablet 2   QUEtiapine (SEROQUEL XR) 200 MG 24 hr tablet Take 200 mg by mouth at bedtime.     Rimegepant Sulfate (NURTEC) 75 MG TBDP Take 1 tab at onset of migraine.  May repeat in 2 hrs, if needed.  Max dose: 2 tabs/day. This is a 30 day prescription. 16 tablet 11   rizatriptan (MAXALT-MLT) 10 MG disintegrating tablet Take 1 tablet (10 mg total) by mouth as needed for migraine. May repeat in 2 hours if needed 9 tablet 11   rosuvastatin (CRESTOR) 20 MG tablet Take 20 mg by mouth daily.     topiramate (TOPAMAX) 100 MG tablet Take 1 tablet (100 mg total) by mouth 2 (two) times daily. 60 tablet 5   traZODone (DESYREL) 50 MG tablet Take 50 mg by mouth at bedtime.     Vitamin D, Ergocalciferol, (DRISDOL) 1.25 MG (50000 UT) CAPS capsule Take 1 capsule (50,000 Units total) by mouth every 7 (seven) days. 5 capsule 11   No current facility-administered medications for this visit.    Allergies   Allergies as of 04/16/2023 - Review Complete  04/04/2023  Allergen Reaction Noted   Metrizamide Rash and Swelling 10/17/2010   Shellfish allergy Swelling 10/29/2013   Amoxapine Nausea Only 10/01/2019   Amoxicillin Other (See Comments) 12/13/2020   Other Itching and Swelling 01/26/2022   Penicillin g Other (See Comments) 12/13/2020   Zoster vaccine recombinant, adjuvanted  08/09/2022   Codeine Itching    Hydrocodone Itching and Rash 12/03/2012   Ivp dye [iodinated contrast media] Rash 10/17/2010   Latex Rash 03/08/2010     Past Medical History   Past Medical History:  Diagnosis Date   Allergy    Anemia    Anxiety    Arthritis    neck   Arthritis    Asthma    Chronic kidney disease, stage 2 (mild) 01/21/2020   CSF leak    Diabetes mellitus without complication (HCC)    GERD (gastroesophageal reflux disease)     History of flexible sigmoidoscopy 1999   Normal to 60cm,   Hypertension    Neuromuscular disorder (HCC)    Ovarian cancer (HCC)    skin cancer   Panic attack    PTSD (post-traumatic stress disorder)    S/P endoscopy 03/2010   Nl, s/p 56-French Maloney dilator, biopsy benign   S/P endoscopy 2008   Mild gastritis   Seizures (HCC)    Sleep apnea    Substance abuse (HCC)    drug addiction    Past Surgical History   Past Surgical History:  Procedure Laterality Date   ABDOMINAL HYSTERECTOMY     ovarian tumors   CHOLECYSTECTOMY     COLONOSCOPY  03/01/2011   Rourk-External hemorrhoids/otherwise normal rectum and colon.   COLONOSCOPY WITH PROPOFOL N/A 08/08/2018   entire examined colon is normal. The distal rectum and anal verge are normal on retroflexion view. Repeat in 5 years.    ESOPHAGOGASTRODUODENOSCOPY  2008   gastritis   ESOPHAGOGASTRODUODENOSCOPY  2012   Moderate sized hiatal hernia, non-H. pylori gastritis   ESOPHAGOGASTRODUODENOSCOPY N/A 11/17/2013   RMR: Mild erosive reflux esophagitis. Patulous EG junction   ESOPHAGOGASTRODUODENOSCOPY (EGD) WITH PROPOFOL N/A 08/08/2018   Erosive reflux esophagitis. Dilated. Small hiatal hernia. Otherwise normal.    ESOPHAGOGASTRODUODENOSCOPY (EGD) WITH PROPOFOL N/A 06/02/2021   Procedure: ESOPHAGOGASTRODUODENOSCOPY (EGD) WITH PROPOFOL;  Surgeon: Corbin Ade, MD;  [facility];  Service: Endoscopy;  Laterality: N/A;  2:00pm   HERNIA REPAIR     Incisional with mesh   KNEE ARTHROSCOPY     left knee surgery in [place]   MALONEY DILATION  08/08/2018   Procedure: MALONEY DILATION;  Surgeon: Corbin Ade, MD;  Location: [facility];  Service: Endoscopy;;   MALONEY DILATION N/A 06/02/2021   Procedure: Alvy Beal;  Surgeon: Corbin Ade, MD;  Location: [facility];  Service: Endoscopy;  Laterality: N/A;   PARTIAL HYSTERECTOMY     SKIN CANCER EXCISION     left bicep   TONSILLECTOMY      Past Family History    Family History  Problem Relation Age of Onset   Crohn's disease Maternal Aunt    Breast cancer Maternal Grandmother    Cancer Maternal Grandmother    Colon cancer Maternal Grandfather        78   Pancreatic cancer Maternal Grandfather    Cancer Maternal Grandfather    Crohn's disease Cousin        x 2 cousins   Breast cancer Daughter 43   Arthritis Mother    COPD Mother    Depression  Mother    Drug abuse Mother    Heart disease Mother    Hypertension Mother    Hyperlipidemia Mother    Learning disabilities Mother    Mental illness Mother    Alcohol abuse Mother    Colon polyps Mother 79       tubular adenoma   Early death Father 83       murder   Alcohol abuse Father    Drug abuse Father    Learning disabilities Father    Cancer Paternal Grandmother    Alcohol abuse Paternal Grandmother    Cancer Paternal Grandfather    Alcohol abuse Maternal Uncle    Alcohol abuse Paternal Uncle    Drug abuse Paternal Uncle     Past Social History   Social History   Socioeconomic History   Marital status: Married    Spouse name: Chrissie Noa   Number of children: Not on file   Years of education: Not on file   Highest education level: 11th grade  Occupational History   Occupation: disabled    Associate Professor: NOT EMPLOYED  Tobacco Use   Smoking status: Every Day    Current packs/day: 0.50    Average packs/day: 0.3 packs/day for 38.9 years (10.0 ttl pk-yrs)    Types: Cigarettes    Start date: 05/03/1984    Passive exposure: Current   Smokeless tobacco: Never   Tobacco comments:    Patient states she is smoking based off her mood.   Vaping Use   Vaping status: Never Used  Substance and Sexual Activity   Alcohol use: Not Currently   Drug use: Not Currently    Types: Marijuana   Sexual activity: Yes    Birth control/protection: Surgical  Other Topics Concern   Not on file  Social History Narrative   Lives at home with her husband.   Right handed.    1-2 cups of coffee 3  times per wk.    Intermittent soda.   Started back smoking   Social Drivers of Corporate investment banker Strain: Low Risk  (11/29/2022)   Received [facility]   Overall Financial Resource Strain (CARDIA)    Difficulty of Paying Living Expenses: Not hard at all  Food Insecurity: No Food Insecurity (11/29/2022)   Received from [facility]   Hunger Vital Sign    Worried About Running Out of Food in the Last Year: Never true    Ran Out of Food in the Last Year: Never true  Transportation Needs: No Transportation Needs (11/29/2022)   Received from [facility] - Transportation    Lack of Transportation (Medical): No    Lack of Transportation (Non-Medical): No  Physical Activity: Inactive (05/28/2019)   Exercise Vital Sign    Days of Exercise per Week: 0 days    Minutes of Exercise per Session: 0 min  Stress: Stress Concern Present (05/28/2019)   [employer] of Occupational Health - Occupational Stress Questionnaire    Feeling of Stress : To some extent  Social Connections: Moderately Integrated (05/28/2019)   Social Connection and Isolation Panel [NHANES]    Frequency of Communication with Friends and Family: More than three times a week    Frequency of Social Gatherings with Friends and Family: More than three times a week    Attends Religious Services: 1 to 4 times per year    Active Member of [employer] or Organizations: No    Attends Banker  Meetings: Never    Marital Status: Married  Catering manager Violence: Not At Risk (05/28/2019)   Humiliation, Afraid, Rape, and Kick questionnaire    Fear of Current or Ex-Partner: No    Emotionally Abused: No    Physically Abused: No    Sexually Abused: No    Review of Systems   General: Negative for anorexia, weight loss, fever, chills, fatigue, weakness. ENT: Negative for hoarseness, difficulty swallowing , nasal congestion. CV: Negative for chest pain, angina, palpitations, dyspnea on  exertion, peripheral edema.  Respiratory: Negative for dyspnea at rest, dyspnea on exertion, cough, sputum, wheezing.  GI: See history of present illness. GU:  Negative for dysuria, hematuria, urinary incontinence, urinary frequency, nocturnal urination.  Endo: Negative for unusual weight change.     Physical Exam   There were no vitals taken for this visit.   General: Well-nourished, well-developed in no acute distress.  Eyes: No icterus. Mouth: Oropharyngeal mucosa moist and pink , no lesions erythema or exudate. Lungs: Clear to auscultation bilaterally.  Heart: Regular rate and rhythm, no murmurs rubs or gallops.  Abdomen: Bowel sounds are normal, nontender, nondistended, no hepatosplenomegaly or masses,  no abdominal bruits or hernia , no rebound or guarding.  Rectal: ***  Extremities: No lower extremity edema. No clubbing or deformities. Neuro: Alert and oriented x 4   Skin: Warm and dry, no jaundice.   Psych: Alert and cooperative, normal mood and affect.  Labs   *** Imaging Studies   DG FL GUIDED LUMBAR PUNCTURE Result Date: 04/02/2023 CLINICAL DATA:  Idiopathic intracranial hypertension. Recurrent headaches. Previous lumbar puncture last May. EXAM: DIAGNOSTIC LUMBAR PUNCTURE UNDER FLUOROSCOPIC GUIDANCE COMPARISON:  Lumbar puncture dated Jun 14, 2022. FLUOROSCOPY: Radiation Exposure Index (as provided by the fluoroscopic device): 10.2 mGy Kerma PROCEDURE: Informed consent was obtained from the patient prior to the procedure, including potential complications of headache, allergy, and pain. With the patient in the left lateral decubitus position, the lower back was prepped with Betadine. 1% Lidocaine was used for local anesthesia. Lumbar puncture was performed at the L2-L3 level via a left interlaminar approach using a 6 inch 20 gauge needle with return of clear, colorless CSF with an opening pressure of 28 cm water. 20 ml of CSF were obtained for laboratory studies. Closing  pressure was 12 cm water. The patient tolerated the procedure well and there were no apparent complications. IMPRESSION: Technically successful fluoroscopically guided lumbar puncture with normalization of elevated opening CSF pressure as described above. Electronically Signed   By: Obie Dredge M.D.   On: 04/02/2023 12:40    Assessment       PLAN   ***   Leanna Battles. Melvyn Neth, MHS, PA-C [facility]

## 2023-04-15 ENCOUNTER — Other Ambulatory Visit: Payer: Self-pay | Admitting: Gastroenterology

## 2023-04-16 ENCOUNTER — Other Ambulatory Visit (HOSPITAL_COMMUNITY): Payer: Self-pay

## 2023-04-16 ENCOUNTER — Telehealth: Payer: Self-pay | Admitting: Pharmacy Technician

## 2023-04-16 ENCOUNTER — Ambulatory Visit: Admitting: Gastroenterology

## 2023-04-16 ENCOUNTER — Telehealth: Payer: Self-pay | Admitting: Nurse Practitioner

## 2023-04-16 NOTE — Progress Notes (Signed)
 Chart reviewed, agree above plan ?

## 2023-04-16 NOTE — Telephone Encounter (Signed)
 Pharmacy Patient Advocate Encounter  Received notification from [facility] MEDICAID that Prior Authorization for Nurtec 75MG  dispersible tablets  has been APPROVED from 04/16/2023 to 04/15/2024   PA #/Case ID/Reference #: [id number]

## 2023-04-16 NOTE — Telephone Encounter (Signed)
 Pharmacy Patient Advocate Encounter   Received notification from CoverMyMeds that prior authorization for Nurtec 75MG  dispersible tablets is required/requested.   Insurance verification completed.   The patient is insured through [facility] MEDICAID .   Per test claim: PA required; PA submitted to above mentioned insurance via CoverMyMeds Key/confirmation #/EOC [id number] Status is pending

## 2023-04-16 NOTE — Telephone Encounter (Signed)
 She is scheduled on 3/11 at 11am.

## 2023-04-16 NOTE — Telephone Encounter (Signed)
 PT got neb solution but no machine. [facility] on order. Pls call PT's husband to advise @ [phone] He states it was to have gone to [facility].

## 2023-04-17 NOTE — Telephone Encounter (Signed)
 Message from [facility]   Zott, Lucita Lora, Raven N; [place], [place] [facility],  I spoke to the staff in the office and they are processing this as a stat. It will be handled today and the patient should expect a call shortly. Sorry for the delay

## 2023-05-01 ENCOUNTER — Telehealth: Payer: Self-pay | Admitting: Neurology

## 2023-05-01 NOTE — Telephone Encounter (Signed)
 Pharmacy tech Sam(in packaging dept) of [facility] is asking for a call to confirm if pt is still to be taking the topiramate (TOPAMAX) 100 MG tablet, please call to confirm.

## 2023-05-01 NOTE — Telephone Encounter (Signed)
 Returned pharmacy tech SAm's call regarding pt's Topamax 100 mg prescription. Confirmed to pharmacy tech that Per Awilda Metro last note from 04/10/2023, pt is to continue taking Topamax 100 mg BID. Sam thanked me for calling and clarifying pt's rx information.

## 2023-05-31 ENCOUNTER — Telehealth: Payer: Self-pay

## 2023-05-31 ENCOUNTER — Ambulatory Visit: Payer: Medicaid Other | Admitting: Nurse Practitioner

## 2023-05-31 NOTE — Telephone Encounter (Signed)
 Patient appt was cancelled today, because she did not have a home sleep study done 04/04/2023. Pt states her case manager will be calling our office to reschedule. The [facility] was notified regarding message and they stated  " I informed her that we needed to schedule her a separate appointment since we do not have enough machines for her for this week".

## 2023-05-31 NOTE — Telephone Encounter (Signed)
 Copied from CRM [id number]. Topic: Appointments - Scheduling Inquiry for Clinic >> May 31, 2023  9:21 AM Crist Dominion wrote: Reason for CRM:  Patient was advised that  her appointment would be cancelled for today as the office does not have a CPAP machine for her. Johnny from [place] social services is calling on patients behalf as they are both still unclear whether this appointment for today at 11 needs be rescheduled or if the patient should come in. Please call Johnny back at [phone]   Pts appointment was cancelled. NFN

## 2023-08-09 ENCOUNTER — Telehealth: Payer: Self-pay | Admitting: Internal Medicine

## 2023-08-09 NOTE — Telephone Encounter (Signed)
 Have tried to call the patient a few times but the line is always busy.  She mailed in her colonoscopy questionnaire but isn't due for TCS until 03/2024 and will receive another questionnaire at that time to complete.  To early to do now.

## 2023-09-10 ENCOUNTER — Encounter: Payer: Self-pay | Admitting: Neurology

## 2023-09-10 ENCOUNTER — Ambulatory Visit (INDEPENDENT_AMBULATORY_CARE_PROVIDER_SITE_OTHER): Admitting: Neurology

## 2023-09-10 VITALS — BP 118/74 | Ht 66.0 in | Wt 239.5 lb

## 2023-09-10 DIAGNOSIS — G932 Benign intracranial hypertension: Secondary | ICD-10-CM

## 2023-09-10 DIAGNOSIS — G40909 Epilepsy, unspecified, not intractable, without status epilepticus: Secondary | ICD-10-CM | POA: Diagnosis not present

## 2023-09-10 DIAGNOSIS — G43709 Chronic migraine without aura, not intractable, without status migrainosus: Secondary | ICD-10-CM

## 2023-09-10 MED ORDER — LAMOTRIGINE 200 MG PO TABS: 200.0000 mg | ORAL_TABLET | Freq: Two times a day (BID) | ORAL | 4 refills | Status: AC

## 2023-09-10 MED ORDER — TOPIRAMATE 100 MG PO TABS: 100.0000 mg | ORAL_TABLET | Freq: Two times a day (BID) | ORAL | 4 refills | Status: AC

## 2023-09-10 NOTE — Progress Notes (Signed)
 Jennifer [name] 10-02-1970   ASSESSMENT AND PLAN 53 y.o. year old female   History of seizure 2.   History of idiopathic intracranial hypertension 3.   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  Check EEG, check levels of Lamictal  and Topamax  Try Nurtec 75 mg as needed for acute headache, previously approved from pharmacy, also has Maxalt .  With history of addiction, is hesitant to use abortive treatment  Follow up in 6 months with Dr.Yan   HISTORY  Jennifer [name] is a 53 year old female, accompanied by her husband, seen in request by primary care nurse practitioner Bucio, Silvio BROCKS, to continue her neurological care at our clinic, initial evaluation was on January 26, 2022, she was previously seen by [facility] neurologist Dr. Milton, who has closed his neurological practice   I reviewed and summarized the referring note. PMHX, extensive records from Dr.Doonquah's office Bipolar, on polypharmacy Ovarian Cancer was treated in 2000,  DM HTN HLD PTSD  History of substance abuse, cocaine in young, quit for many years. Smoke,   She had seizure, 2015, frequent seizure spells with a couple years of span, in the setting of extreme stress, preceding by headache, confusion, feeling nervous, mild bilateral hands tremor, then would involve into whole-body tremor, sometimes with bowel incontinence,  lasting 10 to 15 minutes, she was treated on Keppra  slow titrating dose now 750 mg 4 tablets every night, does complains of side effect, also feeling anxious   Last reported seizure activity in 2015   She carries a diagnosis of bipolar disorder, PTSD, on polypharmacy for her mood disorder, this include lamotrigine  200 mg every night, Lexapro 5 mg daily, Seroquel 200 mg every night   She also has a history of chronic low back pain, frequent flareup, most recent flareup was few weeks ago, she get up after bending over to pick up her telephone cord, she felt her lower back has shifted, sharp radiating pain, she could not move for few minutes, then was able to walk, ever since then, she complains of worsening right-sided low back pain, radiating pain to right hip, gait abnormality, worsening pain bearing weight   She had long history of diabetes, no complaints of bilateral feet numbness tingling, urinary urgency, frequency,   She also complains of worsening headache, retro-orbital area, sometimes occipital region, shooting pain from front to back  UPDATE Feb 4th 2025: She was seen by [facility] in August 2024,  CT showed Small volume fluid and mucosal thickening along the left olfactory cleft underlying the cribriform plate , ethmoidal csf leakage on the termporal bone  Nasal endoscopy:  8mm area of submucosal fluid noted along perpendicular plate of the ethmoid on the left within the olfactory cleft without active extravasation of CSF  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    Post surgical follow up on Jan 17 2023, nasal endoscopy exam showed no  significant abnormality  She no longer has nasal discharge, however continue have frequent headaches, transient left blurry vision, sharp pain to her left eye,  Recently also had bilateral knee joint injection for bilateral knee pain, had bilateral knee injection, her gait complaints most  likely related to her knee issues  Update April 10, 2023 SS: LP 04/02/23, opening pressure 28 cm water . Dr. Onita ordered Nurtec, but insurance requires 2 triptans. Currently taking Imitrex  50 mg, not helping. Reports no change in headache since LP. Continues with frontal headache, pain behind both eyes, sharp pain. Constant pain for last 2 days. Sometimes feel nauseated. No other neuro symptoms. Continues with some mild gait instability. Remains on Lamictal  200 mg BID, no seizures.   Update September 10, 2023 SS: Reviewed ophthalmology note 08/29/2023, papilledema has resolved, exam was normal. Here with Child psychotherapist, Regulatory affairs officer. One morning, smoking a cigarette, drinking coffee, both eyes twitching for a few minutes then it stopped, then back to normal. Remains on Lamictal  200 mg twice daily, claims in past blinking seizures from bright lights. Also on Topamax  100 mg BID. Headaches are better, every other day, severity is less. Are on the temples, pressure, brief, last a few minutes. Down about 5 lbs. She is not driving.   PHYSICAL EXAMNIATION:    09/10/2023    2:18 PM 04/04/2023   11:17 AM 04/02/2023   11:50 AM  Vitals with BMI  Height 5' 6 5' 6   Weight 239 lbs 8 oz 251 lbs 3 oz   BMI 38.67 40.56   Systolic 118 110 869  Diastolic 74 78 72  Pulse  91 68     Gen: NAD, conversant, well nourised, well groomed       Physical Exam  General: The patient is alert and cooperative at the time of the examination.  Skin: No significant peripheral edema is noted.  Neurologic Exam  Mental status: The patient is alert and oriented x 3 at the time of the examination. The patient has apparent normal recent and remote memory, with an apparently normal attention span and concentration ability.  Cranial nerves: Facial symmetry is present. Speech is normal, no aphasia or dysarthria is noted. Extraocular movements are full. Visual fields are full.  Motor: The patient has good strength in all 4  extremities.  Sensory examination: Soft touch sensation is symmetric on the face, arms, and legs.  Coordination: The patient has good finger-nose-finger and heel-to-shin bilaterally.  Gait and station: The patient has a normal gait, independent.  Reflexes: Deep tendon reflexes are symmetric.  REVIEW OF SYSTEMS: Out of a complete 14 system review of symptoms, the patient complains only of the following symptoms, and all other reviewed systems are negative.  See HPI  ALLERGIES: Allergies  Allergen Reactions   Metrizamide Rash and Swelling    Hands swelling/rash   Shellfish Allergy Swelling    Swells tongue   Amoxapine Nausea Only   Amoxicillin  Other (See Comments)    WHAT IS THE REACTION?   Other Itching and Swelling    Shingles vaccine   Penicillin G Other (See Comments)    WHAT IS THE REACTION?   Zoster Vaccine Recombinant, Adjuvanted    Codeine Itching   Hydrocodone  Itching and Rash   Ivp Dye [Iodinated Contrast Media] Rash    Hands swelling/rash   Latex Rash    HOME MEDICATIONS: Outpatient Medications Prior to Visit  Medication Sig Dispense Refill   albuterol  (VENTOLIN  HFA) 108 (90 Base) MCG/ACT inhaler Inhale into the  lungs every 6 (six) hours as needed for wheezing or shortness of breath.     AUSTEDO XR 36 MG TB24 Take 1 tablet by mouth daily.     budesonide  (PULMICORT ) 0.5 MG/2ML nebulizer solution Take 2 mLs (0.5 mg total) by nebulization 2 (two) times daily. 120 mL 11   busPIRone  (BUSPAR ) 10 MG tablet Take 10 mg by mouth 3 (three) times daily.     cetirizine  (ZYRTEC ) 10 MG tablet Take 1 tablet (10 mg total) by mouth every morning. 90 tablet 3   EPINEPHrine 0.15 MG/0.15ML IJ injection Inject 0.15 mg into the muscle as needed for anaphylaxis.     escitalopram (LEXAPRO) 5 MG tablet Take 5 mg by mouth daily.     famotidine  (PEPCID ) 20 MG tablet TAKE 1 TABLET BY MOUTH EVERYDAY AT BEDTIME 90 tablet 1   ipratropium-albuterol  (DUONEB) 0.5-2.5 (3) MG/3ML SOLN Take 3 mLs  by nebulization every 6 (six) hours as needed (shortness of breath, wheezing). 360 mL 5   lamoTRIgine  (LAMICTAL ) 200 MG tablet Take 1 tablet (200 mg total) by mouth 2 (two) times daily. 60 tablet 3   metFORMIN (GLUCOPHAGE) 500 MG tablet Take 500 mg by mouth daily with breakfast.     montelukast  (SINGULAIR ) 10 MG tablet TAKE 1 TABLET BY MOUTH DAILY AT BEDTIME 90 tablet 2   oxybutynin  (DITROPAN ) 5 MG tablet Take 5 mg by mouth 3 (three) times daily.     rizatriptan  (MAXALT -MLT) 10 MG disintegrating tablet Take 1 tablet (10 mg total) by mouth as needed for migraine. May repeat in 2 hours if needed 9 tablet 11   rosuvastatin (CRESTOR) 20 MG tablet Take 20 mg by mouth daily.     topiramate  (TOPAMAX ) 100 MG tablet Take 1 tablet (100 mg total) by mouth 2 (two) times daily. 60 tablet 5   traZODone  (DESYREL ) 50 MG tablet Take 50 mg by mouth at bedtime.     Vitamin D , Ergocalciferol , (DRISDOL ) 1.25 MG (50000 UT) CAPS capsule Take 1 capsule (50,000 Units total) by mouth every 7 (seven) days. 5 capsule 11   OZEMPIC, 1 MG/DOSE, 4 MG/3ML SOPN Inject into the skin.     pantoprazole  (PROTONIX ) 40 MG tablet TAKE 1 TABLET BY MOUTH TWICE A DAY BEFORE MEALS 180 tablet 2   QUEtiapine (SEROQUEL XR) 200 MG 24 hr tablet Take 200 mg by mouth at bedtime.     Rimegepant Sulfate (NURTEC) 75 MG TBDP Take 1 tab at onset of migraine.  May repeat in 2 hrs, if needed.  Max dose: 2 tabs/day. This is a 30 day prescription. (Patient not taking: Reported on 09/10/2023) 16 tablet 11   No facility-administered medications prior to visit.    PAST MEDICAL HISTORY: Past Medical History:  Diagnosis Date   Allergy    Anemia    Anxiety    Arthritis    neck   Arthritis    Asthma    Chronic kidney disease, stage 2 (mild) 01/21/2020   CSF leak    Diabetes mellitus without complication (HCC)    GERD (gastroesophageal reflux disease)    History of flexible sigmoidoscopy 1999   Normal to 60cm,   Hypertension    Neuromuscular disorder  (HCC)    Ovarian cancer (HCC)    skin cancer   Panic attack    PTSD (post-traumatic stress disorder)    S/P endoscopy 03/2010   Nl, s/p 56-French Maloney dilator, biopsy benign   S/P endoscopy 2008   Mild gastritis   Seizures (HCC)  Sleep apnea    Substance abuse (HCC)    drug addiction    PAST SURGICAL HISTORY: Past Surgical History:  Procedure Laterality Date   ABDOMINAL HYSTERECTOMY     ovarian tumors   CHOLECYSTECTOMY     COLONOSCOPY  03/01/2011   Rourk-External hemorrhoids/otherwise normal rectum and colon.   COLONOSCOPY WITH PROPOFOL  N/A 08/08/2018   entire examined colon is normal. The distal rectum and anal verge are normal on retroflexion view. Repeat in 5 years.    ESOPHAGOGASTRODUODENOSCOPY  2008   gastritis   ESOPHAGOGASTRODUODENOSCOPY  2012   Moderate sized hiatal hernia, non-H. pylori gastritis   ESOPHAGOGASTRODUODENOSCOPY N/A 11/17/2013   RMR: Mild erosive reflux esophagitis. Patulous EG junction   ESOPHAGOGASTRODUODENOSCOPY (EGD) WITH PROPOFOL  N/A 08/08/2018   Erosive reflux esophagitis. Dilated. Small hiatal hernia. Otherwise normal.    ESOPHAGOGASTRODUODENOSCOPY (EGD) WITH PROPOFOL  N/A 06/02/2021   Procedure: ESOPHAGOGASTRODUODENOSCOPY (EGD) WITH PROPOFOL ;  Surgeon: Shaaron Lamar HERO, MD;  Location: [facility];  Service: Endoscopy;  Laterality: N/A;  2:00pm   HERNIA REPAIR     Incisional with mesh   KNEE ARTHROSCOPY     left knee surgery in [place]   MALONEY DILATION  08/08/2018   Procedure: MALONEY DILATION;  Surgeon: Shaaron Lamar HERO, MD;  Location: [facility];  Service: Endoscopy;;   MALONEY DILATION N/A 06/02/2021   Procedure: AGAPITO HODGKIN;  Surgeon: Shaaron Lamar HERO, MD;  Location: [facility];  Service: Endoscopy;  Laterality: N/A;   PARTIAL HYSTERECTOMY     SKIN CANCER EXCISION     left bicep   TONSILLECTOMY      FAMILY HISTORY: Family History  Problem Relation Age of Onset   Crohn's disease Maternal Aunt    Breast cancer Maternal  Grandmother    Cancer Maternal Grandmother    Colon cancer Maternal Grandfather        78   Pancreatic cancer Maternal Grandfather    Cancer Maternal Grandfather    Crohn's disease Cousin        x 2 cousins   Breast cancer Daughter 43   Arthritis Mother    COPD Mother    Depression Mother    Drug abuse Mother    Heart disease Mother    Hypertension Mother    Hyperlipidemia Mother    Learning disabilities Mother    Mental illness Mother    Alcohol abuse Mother    Colon polyps Mother 55       tubular adenoma   Early death Father 68       murder   Alcohol abuse Father    Drug abuse Father    Learning disabilities Father    Cancer Paternal Grandmother    Alcohol abuse Paternal Grandmother    Cancer Paternal Grandfather    Alcohol abuse Maternal Uncle    Alcohol abuse Paternal Uncle    Drug abuse Paternal Uncle     SOCIAL HISTORY: Social History   Socioeconomic History   Marital status: Married    Spouse name: Elsie   Number of children: Not on file   Years of education: Not on file   Highest education level: 11th grade  Occupational History   Occupation: disabled    Associate Professor: NOT EMPLOYED  Tobacco Use   Smoking status: Every Day    Current packs/day: 0.50    Average packs/day: 0.3 packs/day for 39.4 years (10.2 ttl pk-yrs)    Types: Cigarettes    Start date: 05/03/1984    Passive exposure: Current  Smokeless tobacco: Never   Tobacco comments:    Patient states she is smoking based off her mood.   Vaping Use   Vaping status: Never Used  Substance and Sexual Activity   Alcohol use: Not Currently   Drug use: Not Currently   Sexual activity: Yes    Birth control/protection: Surgical  Other Topics Concern   Not on file  Social History Narrative   Lives at home with her husband.   Right handed.    1-2 cups of coffee 3 times per wk.    Intermittent soda.   Started back smoking   Social Drivers of Corporate investment banker Strain: Low Risk   (11/29/2022)   [facility]   Overall Financial Resource Strain (CARDIA)    Difficulty of Paying Living Expenses: Not hard at all  Food Insecurity: No Food Insecurity (11/29/2022)   [facility]   Hunger Vital Sign    Within the past 12 months, you worried that your food would run out before you got the money to buy more.: Never true    Within the past 12 months, the food you bought just didn't last and you didn't have money to get more.: Never true  Transportation Needs: No Transportation Needs (11/29/2022)   Received from [facility] - Transportation    Lack of Transportation (Medical): No    Lack of Transportation (Non-Medical): No  Physical Activity: Inactive (05/28/2019)   Exercise Vital Sign    Days of Exercise per Week: 0 days    Minutes of Exercise per Session: 0 min  Stress: Stress Concern Present (05/28/2019)   [employer] of Occupational Health - Occupational Stress Questionnaire    Feeling of Stress : To some extent  Social Connections: Moderately Integrated (05/28/2019)   Social Connection and Isolation Panel    Frequency of Communication with Friends and Family: More than three times a week    Frequency of Social Gatherings with Friends and Family: More than three times a week    Attends Religious Services: 1 to 4 times per year    Active Member of [employer] or Organizations: No    Attends Banker Meetings: Never    Marital Status: Married  Catering manager Violence: Not At Risk (05/28/2019)   Humiliation, Afraid, Rape, and Kick questionnaire    Fear of Current or Ex-Partner: No    Emotionally Abused: No    Physically Abused: No    Sexually Abused: No   PHYSICAL EXAM  Vitals:   09/10/23 1418  BP: 118/74  Weight: 239 lb 8 oz (108.6 kg)  Height: 5' 6 (1.676 m)    Body mass index is 38.66 kg/m.  Generalized: Well developed, in no acute distress  Neurological examination  Mentation: Alert oriented  to time, place, history taking. Follows all commands speech and language fluent Cranial nerve II-XII: Pupils were equal round reactive to light. Extraocular movements were full, visual field were full on confrontational test. Facial sensation and strength were normal. Head turning and shoulder shrug  were normal and symmetric. Motor: The motor testing reveals 5 over 5 strength of all 4 extremities. Good symmetric motor tone is noted throughout.  Sensory: Sensory testing is intact to soft touch on all 4 extremities. No evidence of extinction is noted.  Coordination: Cerebellar testing reveals good finger-nose-finger and heel-to-shin bilaterally.  Gait and station: Gait is steady, but husband walks with her for support Reflexes: Deep tendon  reflexes are symmetric and normal bilaterally.   DIAGNOSTIC DATA (LABS, IMAGING, TESTING) - I reviewed patient records, labs, notes, testing and imaging myself where available.  Lab Results  Component Value Date   WBC 7.1 03/14/2022   HGB 11.9 03/14/2022   HCT 37.8 03/14/2022   MCV 74.6 (L) 03/14/2022   PLT 285 03/14/2022      Component Value Date/Time   NA 141 03/14/2022 0217   NA 140 02/07/2018 1011   K 4.3 03/14/2022 0217   K 4.1 02/23/2012 0000   CL 107 03/14/2022 0217   CO2 25 03/14/2022 0217   GLUCOSE 98 03/14/2022 0217   BUN 20 03/14/2022 0217   BUN 13 02/07/2018 1011   CREATININE 1.19 (H) 03/14/2022 0217   CALCIUM 9.4 03/14/2022 0217   CALCIUM 8.9 02/23/2012 0000   PROT 7.0 03/14/2022 0217   PROT 7.2 04/16/2018 1153   ALBUMIN 3.9 06/01/2020 1224   ALBUMIN 4.1 04/16/2018 1153   AST 31 03/14/2022 0217   AST 13 02/23/2012 0000   ALT 39 (H) 03/14/2022 0217   ALKPHOS 69 06/01/2020 1224   ALKPHOS 71 02/23/2012 0000   BILITOT 0.3 03/14/2022 0217   BILITOT 0.3 04/16/2018 1153   BILITOT 0.1 02/23/2012 0000   GFRNONAA >60 06/01/2020 1224   GFRNONAA 61 10/12/2016 1517   GFRAA 57 (L) 02/07/2018 1011   GFRAA 71 10/12/2016 1517   Lab  Results  Component Value Date   CHOL 172 02/07/2018   HDL 49 02/07/2018   LDLCALC 108 (H) 02/07/2018   TRIG 74 02/07/2018   CHOLHDL 3.5 02/07/2018   Lab Results  Component Value Date   HGBA1C 5.4 02/07/2018   Lab Results  Component Value Date   VITAMINB12 321 04/11/2018   Lab Results  Component Value Date   TSH 1.750 02/07/2018    Lauraine Gayland MANDES, DNP  [facility] [address] [place], [place] [address] [phone]

## 2023-09-10 NOTE — Patient Instructions (Addendum)
 Check EEG  Check labs  Use Nurtec as needed for acute headache, can take maxalt  as alternative but don't use both in 24 hour period, don't treat headache more than 2-3 days a week Continue to work on weight loss Call for seizures, follow up in 6 months

## 2023-09-11 ENCOUNTER — Encounter: Payer: Self-pay | Admitting: Cardiology

## 2023-09-11 ENCOUNTER — Ambulatory Visit: Attending: Cardiology | Admitting: Cardiology

## 2023-09-11 VITALS — BP 100/64 | HR 92 | Ht 66.0 in | Wt 238.2 lb

## 2023-09-11 DIAGNOSIS — R079 Chest pain, unspecified: Secondary | ICD-10-CM | POA: Insufficient documentation

## 2023-09-11 DIAGNOSIS — R0789 Other chest pain: Secondary | ICD-10-CM | POA: Diagnosis not present

## 2023-09-11 NOTE — Patient Instructions (Signed)
Medication Instructions: Your physician recommends that you continue on your current medications as directed. Please refer to the Current Medication list given to you today.   Labwork: None today  Testing/Procedures: Your physician has requested that you have a lexiscan myoview. For further information please visit [email or web address]. Please follow instruction sheet, as given.   Follow-Up: To be determined after lexiscan  Any Other Special Instructions Will Be Listed Below (If Applicable).  If you need a refill on your cardiac medications before your next appointment, please call your pharmacy.

## 2023-09-11 NOTE — Progress Notes (Signed)
 Clinical Summary [name] is a 53 y.o.female last seen by NP Jennifer [name] in 03/2020, seen today as a new patient for the following medical problems.  Referral form lists referral for PAF however included clinic note mentions referral for chest pain, no mention of any prior PAF history.    1.History of chest pain  - 04/2013 nuclear stress: no ischemia - 11/2017 nuclear stress: no ischemia - Jan 2022 nuclear stress: no clear ischemia Jan 2022 echo: LVEF 68%, no WMAs   -midchest, feels like something sitting on chest. 5/10 in severity. Can occur at rest or with exertion. Can get diaphoretic. Lasts few seconds, occurs about 1-2 times per month. - can have some SOB/DOE with activities.  - reports chest pain is different from her prior symptoms over the years.   2. History of CSF leak    3. Palpitations - can feel heart racing at times, particularly with activity. - typically occurs with activity. Lasts a few minutes. Occcuring few times a week.  -03/2020 monitor benign ectopy.   Past Medical History:  Diagnosis Date   Allergy    Anemia    Anxiety    Arthritis    neck   Arthritis    Asthma    Chronic kidney disease, stage 2 (mild) 01/21/2020   CSF leak    Diabetes mellitus without complication (HCC)    GERD (gastroesophageal reflux disease)    History of flexible sigmoidoscopy 1999   Normal to 60cm,   Hypertension    Neuromuscular disorder (HCC)    Ovarian cancer (HCC)    skin cancer   Panic attack    PTSD (post-traumatic stress disorder)    S/P endoscopy 03/2010   Nl, s/p 56-French Maloney dilator, biopsy benign   S/P endoscopy 2008   Mild gastritis   Seizures (HCC)    Sleep apnea    Substance abuse (HCC)    drug addiction     Allergies  Allergen Reactions   Metrizamide Rash and Swelling    Hands swelling/rash   Shellfish Allergy Swelling    Swells tongue   Amoxapine Nausea Only   Amoxicillin  Other (See Comments)    WHAT IS THE REACTION?   Other  Itching and Swelling    Shingles vaccine   Penicillin G Other (See Comments)    WHAT IS THE REACTION?   Zoster Vaccine Recombinant, Adjuvanted    Codeine Itching   Hydrocodone  Itching and Rash   Ivp Dye [Iodinated Contrast Media] Rash    Hands swelling/rash   Latex Rash     Current Outpatient Medications  Medication Sig Dispense Refill   albuterol  (VENTOLIN  HFA) 108 (90 Base) MCG/ACT inhaler Inhale into the lungs every 6 (six) hours as needed for wheezing or shortness of breath.     AUSTEDO XR 36 MG TB24 Take 1 tablet by mouth daily.     budesonide  (PULMICORT ) 0.5 MG/2ML nebulizer solution Take 2 mLs (0.5 mg total) by nebulization 2 (two) times daily. 120 mL 11   busPIRone  (BUSPAR ) 10 MG tablet Take 10 mg by mouth 3 (three) times daily.     cetirizine  (ZYRTEC ) 10 MG tablet Take 1 tablet (10 mg total) by mouth every morning. 90 tablet 3   EPINEPHrine 0.15 MG/0.15ML IJ injection Inject 0.15 mg into the muscle as needed for anaphylaxis.     escitalopram (LEXAPRO) 5 MG tablet Take 5 mg by mouth daily.     famotidine  (PEPCID ) 20 MG tablet TAKE 1 TABLET BY  MOUTH EVERYDAY AT BEDTIME 90 tablet 1   ipratropium-albuterol  (DUONEB) 0.5-2.5 (3) MG/3ML SOLN Take 3 mLs by nebulization every 6 (six) hours as needed (shortness of breath, wheezing). 360 mL 5   lamoTRIgine  (LAMICTAL ) 200 MG tablet Take 1 tablet (200 mg total) by mouth 2 (two) times daily. 180 tablet 4   metFORMIN (GLUCOPHAGE) 500 MG tablet Take 500 mg by mouth daily with breakfast.     montelukast  (SINGULAIR ) 10 MG tablet TAKE 1 TABLET BY MOUTH DAILY AT BEDTIME 90 tablet 2   oxybutynin  (DITROPAN ) 5 MG tablet Take 5 mg by mouth 3 (three) times daily.     OZEMPIC, 1 MG/DOSE, 4 MG/3ML SOPN Inject into the skin.     pantoprazole  (PROTONIX ) 40 MG tablet TAKE 1 TABLET BY MOUTH TWICE A DAY BEFORE MEALS 180 tablet 2   QUEtiapine (SEROQUEL XR) 200 MG 24 hr tablet Take 200 mg by mouth at bedtime.     Rimegepant Sulfate (NURTEC) 75 MG TBDP Take 1  tab at onset of migraine.  May repeat in 2 hrs, if needed.  Max dose: 2 tabs/day. This is a 30 day prescription. (Patient not taking: Reported on 09/10/2023) 16 tablet 11   rizatriptan  (MAXALT -MLT) 10 MG disintegrating tablet Take 1 tablet (10 mg total) by mouth as needed for migraine. May repeat in 2 hours if needed 9 tablet 11   rosuvastatin (CRESTOR) 20 MG tablet Take 20 mg by mouth daily.     topiramate  (TOPAMAX ) 100 MG tablet Take 1 tablet (100 mg total) by mouth 2 (two) times daily. 180 tablet 4   traZODone  (DESYREL ) 50 MG tablet Take 50 mg by mouth at bedtime.     Vitamin D , Ergocalciferol , (DRISDOL ) 1.25 MG (50000 UT) CAPS capsule Take 1 capsule (50,000 Units total) by mouth every 7 (seven) days. 5 capsule 11   No current facility-administered medications for this visit.     Past Surgical History:  Procedure Laterality Date   ABDOMINAL HYSTERECTOMY     ovarian tumors   CHOLECYSTECTOMY     COLONOSCOPY  03/01/2011   Rourk-External hemorrhoids/otherwise normal rectum and colon.   COLONOSCOPY WITH PROPOFOL  N/A 08/08/2018   entire examined colon is normal. The distal rectum and anal verge are normal on retroflexion view. Repeat in 5 years.    ESOPHAGOGASTRODUODENOSCOPY  2008   gastritis   ESOPHAGOGASTRODUODENOSCOPY  2012   Moderate sized hiatal hernia, non-H. pylori gastritis   ESOPHAGOGASTRODUODENOSCOPY N/A 11/17/2013   RMR: Mild erosive reflux esophagitis. Patulous EG junction   ESOPHAGOGASTRODUODENOSCOPY (EGD) WITH PROPOFOL  N/A 08/08/2018   Erosive reflux esophagitis. Dilated. Small hiatal hernia. Otherwise normal.    ESOPHAGOGASTRODUODENOSCOPY (EGD) WITH PROPOFOL  N/A 06/02/2021   Procedure: ESOPHAGOGASTRODUODENOSCOPY (EGD) WITH PROPOFOL ;  Surgeon: Jennifer Lamar HERO, MD;  [facility];  Service: Endoscopy;  Laterality: N/A;  2:00pm   HERNIA REPAIR     Incisional with mesh   KNEE ARTHROSCOPY     left knee surgery in [place]   MALONEY DILATION  08/08/2018   Procedure: MALONEY  DILATION;  Surgeon: Jennifer Lamar HERO, MD;  [facility]: [facility];  Service: Endoscopy;;   MALONEY DILATION N/A 06/02/2021   Procedure: Jennifer [name];  Surgeon: Jennifer Lamar HERO, MD;  [facility];  Service: Endoscopy;  Laterality: N/A;   PARTIAL HYSTERECTOMY     SKIN CANCER EXCISION     left bicep   TONSILLECTOMY       Allergies  Allergen Reactions   Metrizamide Rash and Swelling  Hands swelling/rash   Shellfish Allergy Swelling    Swells tongue   Amoxapine Nausea Only   Amoxicillin  Other (See Comments)    WHAT IS THE REACTION?   Other Itching and Swelling    Shingles vaccine   Penicillin G Other (See Comments)    WHAT IS THE REACTION?   Zoster Vaccine Recombinant, Adjuvanted    Codeine Itching   Hydrocodone  Itching and Rash   Ivp Dye [Iodinated Contrast Media] Rash    Hands swelling/rash   Latex Rash      Family History  Problem Relation Age of Onset   Crohn's disease Maternal Aunt    Breast cancer Maternal Grandmother    Cancer Maternal Grandmother    Colon cancer Maternal Grandfather        78   Pancreatic cancer Maternal Grandfather    Cancer Maternal Grandfather    Crohn's disease Cousin        x 2 cousins   Breast cancer Daughter 74   Arthritis Mother    COPD Mother    Depression Mother    Drug abuse Mother    Heart disease Mother    Hypertension Mother    Hyperlipidemia Mother    Learning disabilities Mother    Mental illness Mother    Alcohol abuse Mother    Colon polyps Mother 42       tubular adenoma   Early death Father 3       murder   Alcohol abuse Father    Drug abuse Father    Learning disabilities Father    Cancer Paternal Grandmother    Alcohol abuse Paternal Grandmother    Cancer Paternal Grandfather    Alcohol abuse Maternal Uncle    Alcohol abuse Paternal Uncle    Drug abuse Paternal Uncle      Social History Jennifer [name] reports that she has been smoking cigarettes. She started smoking about 39 years ago. She  has a 10.2 pack-year smoking history. She has been exposed to tobacco smoke. She has never used smokeless tobacco. Jennifer [name] reports that she does not currently use alcohol.   Physical Examination Today's Vitals   09/11/23 0902  BP: 100/64  Pulse: 92  SpO2: 95%  Weight: 238 lb 3.2 oz (108 kg)  Height: 5' 6 (1.676 m)  PainSc: 0-No pain   Body mass index is 38.45 kg/m.  Gen: resting comfortably, no acute distress HEENT: no scleral icterus, pupils equal round and reactive, no palptable cervical adenopathy,  CV: RRR, no m/rg, no jvd Resp: Clear to auscultation bilaterally GI: abdomen is soft, non-tender, non-distended, normal bowel sounds, no hepatosplenomegaly MSK: extremities are warm, no edema.  Skin: warm, no rash Neuro:  no focal deficits Psych: appropriate affect     Assessment and Plan  1.Chest pain - recent symptoms unclear etiology, different from her prior pains - has dye allergy, will plan for lexiscan  to further evaluate - EKG today SR, nonspecific ST/T changes  2. Palpitations - benign monitor 2022 - only with activity, very sedentary. Suspect related to deconditioning, no plans to repeat monitor at this time   F/u pending lexiscan , if benign f/u just as needed      Dorn PHEBE Ross, M.D

## 2023-09-12 ENCOUNTER — Ambulatory Visit: Payer: Self-pay | Admitting: Neurology

## 2023-09-12 LAB — CBC WITH DIFFERENTIAL/PLATELET
Basophils Absolute: 0.1 x10E3/uL (ref 0.0–0.2)
Basos: 1 %
EOS (ABSOLUTE): 0 x10E3/uL (ref 0.0–0.4)
Eos: 1 %
Hematocrit: 39.8 % (ref 34.0–46.6)
Hemoglobin: 11.8 g/dL (ref 11.1–15.9)
Immature Grans (Abs): 0 x10E3/uL (ref 0.0–0.1)
Immature Granulocytes: 0 %
Lymphocytes Absolute: 2.8 x10E3/uL (ref 0.7–3.1)
Lymphs: 46 %
MCH: 23.3 pg — ABNORMAL LOW (ref 26.6–33.0)
MCHC: 29.6 g/dL — ABNORMAL LOW (ref 31.5–35.7)
MCV: 79 fL (ref 79–97)
Monocytes Absolute: 0.4 x10E3/uL (ref 0.1–0.9)
Monocytes: 6 %
Neutrophils Absolute: 2.9 x10E3/uL (ref 1.4–7.0)
Neutrophils: 46 %
Platelets: 237 x10E3/uL (ref 150–450)
RBC: 5.06 x10E6/uL (ref 3.77–5.28)
RDW: 15.3 % (ref 11.7–15.4)
WBC: 6.2 x10E3/uL (ref 3.4–10.8)

## 2023-09-12 LAB — COMPREHENSIVE METABOLIC PANEL WITH GFR
ALT: 9 IU/L (ref 0–32)
AST: 11 IU/L (ref 0–40)
Albumin: 4.8 g/dL (ref 3.8–4.9)
Alkaline Phosphatase: 86 IU/L (ref 44–121)
BUN/Creatinine Ratio: 13 (ref 9–23)
BUN: 15 mg/dL (ref 6–24)
Bilirubin Total: <0.2 mg/dL (ref 0.0–1.2)
CO2: 16 mmol/L — ABNORMAL LOW (ref 20–29)
Calcium: 9.8 mg/dL (ref 8.7–10.2)
Chloride: 111 mmol/L — ABNORMAL HIGH (ref 96–106)
Creatinine, Ser: 1.14 mg/dL — ABNORMAL HIGH (ref 0.57–1.00)
Globulin, Total: 2.6 g/dL (ref 1.5–4.5)
Glucose: 84 mg/dL (ref 70–99)
Potassium: 4 mmol/L (ref 3.5–5.2)
Sodium: 141 mmol/L (ref 134–144)
Total Protein: 7.4 g/dL (ref 6.0–8.5)
eGFR: 58 mL/min/1.73 — ABNORMAL LOW (ref >59)

## 2023-09-12 LAB — TOPIRAMATE LEVEL: Topiramate Lvl: 11.4 ug/mL (ref 2.0–25.0)

## 2023-09-12 LAB — LAMOTRIGINE LEVEL: Lamotrigine Lvl: 7.1 ug/mL (ref 2.0–20.0)

## 2023-09-14 NOTE — Progress Notes (Signed)
 Chart reviewed, agree above plan ?

## 2023-09-18 ENCOUNTER — Ambulatory Visit (INDEPENDENT_AMBULATORY_CARE_PROVIDER_SITE_OTHER): Admitting: Neurology

## 2023-09-18 ENCOUNTER — Encounter (HOSPITAL_COMMUNITY)

## 2023-09-18 ENCOUNTER — Ambulatory Visit (HOSPITAL_COMMUNITY)

## 2023-09-18 DIAGNOSIS — G40909 Epilepsy, unspecified, not intractable, without status epilepticus: Secondary | ICD-10-CM

## 2023-09-20 ENCOUNTER — Other Ambulatory Visit: Payer: Self-pay | Admitting: Student

## 2023-09-20 ENCOUNTER — Ambulatory Visit: Payer: Medicaid Other | Admitting: Neurology

## 2023-09-20 ENCOUNTER — Encounter (HOSPITAL_COMMUNITY): Payer: Self-pay

## 2023-09-20 ENCOUNTER — Ambulatory Visit (HOSPITAL_COMMUNITY)
Admission: RE | Admit: 2023-09-20 | Discharge: 2023-09-20 | Disposition: A | Discharge status: Home / Self Care | Source: Ambulatory Visit | Attending: Cardiology | Admitting: Cardiology

## 2023-09-20 ENCOUNTER — Ambulatory Visit (HOSPITAL_BASED_OUTPATIENT_CLINIC_OR_DEPARTMENT_OTHER)
Admission: RE | Admit: 2023-09-20 | Discharge: 2023-09-20 | Disposition: A | Discharge status: Home / Self Care | Source: Ambulatory Visit | Attending: Cardiology | Admitting: Cardiology

## 2023-09-20 DIAGNOSIS — R079 Chest pain, unspecified: Secondary | ICD-10-CM | POA: Insufficient documentation

## 2023-09-20 LAB — NM MYOCAR MULTI W/SPECT W/WALL MOTION / EF
Estimated workload: 1
Exercise duration (min): 0 min
Exercise duration (sec): 0 s
LV dias vol: 100 mL (ref 46–106)
LV sys vol: 55 mL (ref 3.8–5.2)
MPHR: 168 {beats}/min
Nuc Stress EF: 45 %
Peak HR: 102 {beats}/min
Percent HR: 60 %
RATE: 0.5
Rest HR: 75 {beats}/min
Rest Nuclear Isotope Dose: 10.2 mCi
SDS: 5
SRS: 0
SSS: 5
ST Depression (mm): 0 mm
Stress Nuclear Isotope Dose: 33 mCi

## 2023-09-20 MED ORDER — REGADENOSON 0.4 MG/5ML IV SOLN
INTRAVENOUS | Status: AC
  Administered 2023-09-20: 0.4 mg via INTRAVENOUS
  Filled 2023-09-20: qty 5

## 2023-09-20 MED ORDER — TECHNETIUM TC 99M TETROFOSMIN IV KIT
10.0000 | PACK | Freq: Once | INTRAVENOUS | Status: AC | PRN
  Administered 2023-09-20: 10.2 via INTRAVENOUS

## 2023-09-20 MED ORDER — SODIUM CHLORIDE FLUSH 0.9 % IV SOLN
INTRAVENOUS | Status: AC
Start: 2023-09-20 — End: 2023-09-20
  Administered 2023-09-20: 10 mL via INTRAVENOUS
  Filled 2023-09-20: qty 10

## 2023-09-20 MED ORDER — TECHNETIUM TC 99M TETROFOSMIN IV KIT
30.0000 | PACK | Freq: Once | INTRAVENOUS | Status: AC | PRN
  Administered 2023-09-20: 33 via INTRAVENOUS

## 2023-09-20 NOTE — Progress Notes (Signed)
     Jennifer [name] presented for a Lexiscan  nuclear stress test today.  I Laymon CHRISTELLA Qua, PA-C, provided direct supervision and was present during the stress portion of the study today, which was completed without significant symptoms, immediate complications, or acute ST/T changes on ECG.  Stress imaging is pending at this time.  Preliminary ECG findings may be listed in the chart, but the stress test result will not be finalized until perfusion imaging is complete.  Laymon CHRISTELLA Qua, PA-C  09/20/2023, 10:55 AM

## 2023-09-23 NOTE — Progress Notes (Deleted)
 GI Office Note    Referring Provider: Alston Silvio BROCKS, FNP Primary Care Physician:  Bucio, Elsa C, FNP  Primary Gastroenterologist:  Chief Complaint   No chief complaint on file.   History of Present Illness   KAMILLE TOOMEY is a 53 y.o. female presenting today to discuss 5-year screening colonoscopy.  Mother has a history of colon polyps.  Patient with recent cardiac evaluation for chest pain.  Recent Lexiscan  Myoview  completed last week with no evidence of ischemia.  Study with low risk.  LVEF with mildly reduced global function, nuclear stress EF 45%.  Cardiology recommendations pending.   Prior Data   EGD April 2023: - Mild erosive reflux esophagitis (single 7 mm erosions straddling the GE junction) status post dilation - Small hiatal hernia - Normal duodenal bulb and second portion duodenum  Colonoscopy July 2020: -Entire examined colon normal - Distal rectum and anal verge normal -Repeat colonoscopy in 5 years due to family history   Medications   Current Outpatient Medications  Medication Sig Dispense Refill   albuterol  (VENTOLIN  HFA) 108 (90 Base) MCG/ACT inhaler Inhale into the lungs every 6 (six) hours as needed for wheezing or shortness of breath.     AUSTEDO XR 36 MG TB24 Take 1 tablet by mouth daily.     budesonide  (PULMICORT ) 0.5 MG/2ML nebulizer solution Take 2 mLs (0.5 mg total) by nebulization 2 (two) times daily. 120 mL 11   busPIRone  (BUSPAR ) 10 MG tablet Take 10 mg by mouth 3 (three) times daily.     cetirizine  (ZYRTEC ) 10 MG tablet Take 1 tablet (10 mg total) by mouth every morning. 90 tablet 3   ENBREL SURECLICK 50 MG/ML injection Inject into the skin.     EPINEPHrine 0.15 MG/0.15ML IJ injection Inject 0.15 mg into the muscle as needed for anaphylaxis.     escitalopram (LEXAPRO) 5 MG tablet Take 5 mg by mouth daily.     famotidine  (PEPCID ) 20 MG tablet TAKE 1 TABLET BY MOUTH EVERYDAY AT BEDTIME 90 tablet 1   ipratropium-albuterol  (DUONEB)  0.5-2.5 (3) MG/3ML SOLN Take 3 mLs by nebulization every 6 (six) hours as needed (shortness of breath, wheezing). 360 mL 5   lamoTRIgine  (LAMICTAL ) 200 MG tablet Take 1 tablet (200 mg total) by mouth 2 (two) times daily. 180 tablet 4   leflunomide (ARAVA) 20 MG tablet Take 20 mg by mouth daily.     metFORMIN (GLUCOPHAGE) 500 MG tablet Take 500 mg by mouth daily with breakfast.     montelukast  (SINGULAIR ) 10 MG tablet TAKE 1 TABLET BY MOUTH DAILY AT BEDTIME 90 tablet 2   omeprazole (PRILOSEC) 40 MG capsule Take 40 mg by mouth every morning.     oxybutynin  (DITROPAN ) 5 MG tablet Take 5 mg by mouth 3 (three) times daily. (Patient not taking: Reported on 09/11/2023)     OZEMPIC, 1 MG/DOSE, 4 MG/3ML SOPN Inject into the skin.     pantoprazole  (PROTONIX ) 40 MG tablet TAKE 1 TABLET BY MOUTH TWICE A DAY BEFORE MEALS (Patient not taking: Reported on 09/11/2023) 180 tablet 2   QUEtiapine (SEROQUEL XR) 200 MG 24 hr tablet Take 200 mg by mouth at bedtime. (Patient taking differently: Take 300 mg by mouth at bedtime.)     Rimegepant Sulfate (NURTEC) 75 MG TBDP Take 1 tab at onset of migraine.  May repeat in 2 hrs, if needed.  Max dose: 2 tabs/day. This is a 30 day prescription. (Patient not taking: Reported on 09/11/2023) 16  tablet 11   rizatriptan  (MAXALT -MLT) 10 MG disintegrating tablet Take 1 tablet (10 mg total) by mouth as needed for migraine. May repeat in 2 hours if needed 9 tablet 11   rosuvastatin (CRESTOR) 20 MG tablet Take 20 mg by mouth daily.     topiramate  (TOPAMAX ) 100 MG tablet Take 1 tablet (100 mg total) by mouth 2 (two) times daily. 180 tablet 4   traZODone  (DESYREL ) 50 MG tablet Take 50 mg by mouth at bedtime.     Vitamin D , Ergocalciferol , (DRISDOL ) 1.25 MG (50000 UT) CAPS capsule Take 1 capsule (50,000 Units total) by mouth every 7 (seven) days. 5 capsule 11   No current facility-administered medications for this visit.    Allergies   Allergies as of 09/24/2023 - Review Complete 09/20/2023   Allergen Reaction Noted   Metrizamide Rash and Swelling 10/17/2010   Shellfish allergy Swelling 10/29/2013   Amoxapine Nausea Only 10/01/2019   Amoxicillin  Other (See Comments) 12/13/2020   Other Itching and Swelling 01/26/2022   Penicillin g Other (See Comments) 12/13/2020   Zoster vaccine recombinant, adjuvanted  08/09/2022   Codeine Itching    Hydrocodone  Itching and Rash 12/03/2012   Ivp dye [iodinated contrast media] Rash 10/17/2010   Latex Rash 03/08/2010     Past Medical History   Past Medical History:  Diagnosis Date   Allergy    Anemia    Anxiety    Arthritis    neck   Arthritis    Asthma    Chronic kidney disease, stage 2 (mild) 01/21/2020   CSF leak    Diabetes mellitus without complication (HCC)    GERD (gastroesophageal reflux disease)    History of flexible sigmoidoscopy 1999   Normal to 60cm,   Hypertension    Neuromuscular disorder (HCC)    Ovarian cancer (HCC)    skin cancer   Panic attack    PTSD (post-traumatic stress disorder)    S/P endoscopy 03/2010   Nl, s/p 56-French Maloney dilator, biopsy benign   S/P endoscopy 2008   Mild gastritis   Seizures (HCC)    Sleep apnea    Substance abuse (HCC)    drug addiction    Past Surgical History   Past Surgical History:  Procedure Laterality Date   ABDOMINAL HYSTERECTOMY     ovarian tumors   CHOLECYSTECTOMY     COLONOSCOPY  03/01/2011   Rourk-External hemorrhoids/otherwise normal rectum and colon.   COLONOSCOPY WITH PROPOFOL  N/A 08/08/2018   entire examined colon is normal. The distal rectum and anal verge are normal on retroflexion view. Repeat in 5 years.    ESOPHAGOGASTRODUODENOSCOPY  2008   gastritis   ESOPHAGOGASTRODUODENOSCOPY  2012   Moderate sized hiatal hernia, non-H. pylori gastritis   ESOPHAGOGASTRODUODENOSCOPY N/A 11/17/2013   RMR: Mild erosive reflux esophagitis. Patulous EG junction   ESOPHAGOGASTRODUODENOSCOPY (EGD) WITH PROPOFOL  N/A 08/08/2018   Erosive reflux esophagitis.  Dilated. Small hiatal hernia. Otherwise normal.    ESOPHAGOGASTRODUODENOSCOPY (EGD) WITH PROPOFOL  N/A 06/02/2021   Procedure: ESOPHAGOGASTRODUODENOSCOPY (EGD) WITH PROPOFOL ;  Surgeon: Shaaron Lamar HERO, MD;  [facility];  Service: Endoscopy;  Laterality: N/A;  2:00pm   HERNIA REPAIR     Incisional with mesh   KNEE ARTHROSCOPY     left knee surgery in [place]   MALONEY DILATION  08/08/2018   Procedure: MALONEY DILATION;  Surgeon: Shaaron Lamar HERO, MD;  [facility];  Service: Endoscopy;;   MALONEY DILATION N/A 06/02/2021   Procedure: AGAPITO DILATION;  Surgeon:  Rourk, Lamar HERO, MD;  [facility];  Service: Endoscopy;  Laterality: N/A;   PARTIAL HYSTERECTOMY     SKIN CANCER EXCISION     left bicep   TONSILLECTOMY      Past Family History   Family History  Problem Relation Age of Onset   Crohn's disease Maternal Aunt    Breast cancer Maternal Grandmother    Cancer Maternal Grandmother    Colon cancer Maternal Grandfather        78   Pancreatic cancer Maternal Grandfather    Cancer Maternal Grandfather    Crohn's disease Cousin        x 2 cousins   Breast cancer Daughter 68   Arthritis Mother    COPD Mother    Depression Mother    Drug abuse Mother    Heart disease Mother    Hypertension Mother    Hyperlipidemia Mother    Learning disabilities Mother    Mental illness Mother    Alcohol abuse Mother    Colon polyps Mother 5       tubular adenoma   Early death Father 66       murder   Alcohol abuse Father    Drug abuse Father    Learning disabilities Father    Cancer Paternal Grandmother    Alcohol abuse Paternal Grandmother    Cancer Paternal Grandfather    Alcohol abuse Maternal Uncle    Alcohol abuse Paternal Uncle    Drug abuse Paternal Uncle     Past Social History   Social History   Socioeconomic History   Marital status: Married    Spouse name: Elsie   Number of children: Not on file   Years of education: Not on file    Highest education level: 11th grade  Occupational History   Occupation: disabled    Associate Professor: NOT EMPLOYED  Tobacco Use   Smoking status: Every Day    Current packs/day: 0.50    Average packs/day: 0.3 packs/day for 39.4 years (10.2 ttl pk-yrs)    Types: Cigarettes    Start date: 05/03/1984    Passive exposure: Current   Smokeless tobacco: Never   Tobacco comments:    Patient states she is smoking based off her mood.   Vaping Use   Vaping status: Never Used  Substance and Sexual Activity   Alcohol use: Not Currently   Drug use: Not Currently   Sexual activity: Yes    Birth control/protection: Surgical  Other Topics Concern   Not on file  Social History Narrative   Lives at home with her husband.   Right handed.    1-2 cups of coffee 3 times per wk.    Intermittent soda.   Started back smoking   Social Drivers of Corporate investment banker Strain: Low Risk  (11/29/2022)   Received [facility]   Overall Financial Resource Strain (CARDIA)    Difficulty of Paying Living Expenses: Not hard at all  Food Insecurity: No Food Insecurity (11/29/2022)   Received from [facility]   Hunger Vital Sign    Within the past 12 months, you worried that your food would run out before you got the money to buy more.: Never true    Within the past 12 months, the food you bought just didn't last and you didn't have money to get more.: Never true  Transportation Needs: No Transportation Needs (11/29/2022)   [facility]  PRAPARE - Administrator, Civil Service (Medical): No    Lack of Transportation (Non-Medical): No  Physical Activity: Inactive (05/28/2019)   Exercise Vital Sign    Days of Exercise per Week: 0 days    Minutes of Exercise per Session: 0 min  Stress: Stress Concern Present (05/28/2019)   [employer] of Occupational Health - Occupational Stress Questionnaire    Feeling of Stress : To some extent  Social Connections: Moderately  Integrated (05/28/2019)   Social Connection and Isolation Panel    Frequency of Communication with Friends and Family: More than three times a week    Frequency of Social Gatherings with Friends and Family: More than three times a week    Attends Religious Services: 1 to 4 times per year    Active Member of [employer] or Organizations: No    Attends Banker Meetings: Never    Marital Status: Married  Catering manager Violence: Not At Risk (05/28/2019)   Humiliation, Afraid, Rape, and Kick questionnaire    Fear of Current or Ex-Partner: No    Emotionally Abused: No    Physically Abused: No    Sexually Abused: No    Review of Systems   General: Negative for anorexia, weight loss, fever, chills, fatigue, weakness. ENT: Negative for hoarseness, difficulty swallowing , nasal congestion. CV: Negative for chest pain, angina, palpitations, dyspnea on exertion, peripheral edema.  Respiratory: Negative for dyspnea at rest, dyspnea on exertion, cough, sputum, wheezing.  GI: See history of present illness. GU:  Negative for dysuria, hematuria, urinary incontinence, urinary frequency, nocturnal urination.  Endo: Negative for unusual weight change.     Physical Exam   There were no vitals taken for this visit.   General: Well-nourished, well-developed in no acute distress.  Eyes: No icterus. Mouth: Oropharyngeal mucosa moist and pink   Lungs: Clear to auscultation bilaterally.  Heart: Regular rate and rhythm, no murmurs rubs or gallops.  Abdomen: Bowel sounds are normal, nontender, nondistended, no hepatosplenomegaly or masses,  no abdominal bruits or hernia , no rebound or guarding.  Rectal: not performed Extremities: No lower extremity edema. No clubbing or deformities. Neuro: Alert and oriented x 4   Skin: Warm and dry, no jaundice.   Psych: Alert and cooperative, normal mood and affect.  Labs   Lab Results  Component Value Date   NA 141 09/10/2023   CL 111 (H) 09/10/2023    K 4.0 09/10/2023   CO2 16 (L) 09/10/2023   BUN 15 09/10/2023   CREATININE 1.14 (H) 09/10/2023   EGFR 58 (L) 09/10/2023   CALCIUM 9.8 09/10/2023   ALBUMIN 4.8 09/10/2023   GLUCOSE 84 09/10/2023   Lab Results  Component Value Date   ALT 9 09/10/2023   AST 11 09/10/2023   ALKPHOS 86 09/10/2023   BILITOT <0.2 09/10/2023   Lab Results  Component Value Date   WBC 6.2 09/10/2023   HGB 11.8 09/10/2023   HCT 39.8 09/10/2023   MCV 79 09/10/2023   PLT 237 09/10/2023    Imaging Studies   NM Myocar Multi W/Spect W/Wall Motion / EF Result Date: 09/20/2023   Findings are consistent with no ischemia. The study is low risk.   No ST deviation was noted. The ECG was negative for ischemia.   LV perfusion is normal.  No significant myocardial perfusion defect indicate scar or ischemia.  There is breast attenuation artifact and also arms down imaging technique.   Left ventricular function is abnormal. Global  function is mildly reduced. Nuclear stress EF: 45%.  This is based on gated imaging, visually LV contraction appears normal.  Suggest corroboration with echocardiography.    Assessment/Plan:           Sonny RAMAN. Ezzard, MHS, PA-C [facility]

## 2023-09-24 ENCOUNTER — Telehealth: Payer: Self-pay | Admitting: Gastroenterology

## 2023-09-24 ENCOUNTER — Ambulatory Visit: Admitting: Gastroenterology

## 2023-09-24 NOTE — Telephone Encounter (Signed)
 Please let pt know that she should consider rescheduling her missed ov today because she is due colonoscopy. She will need to have cardiac work up complete before we can schedule colonoscopy however.   Please offer her routine ov with me.

## 2023-09-25 ENCOUNTER — Encounter: Payer: Self-pay | Admitting: Gastroenterology

## 2023-10-03 ENCOUNTER — Ambulatory Visit: Payer: Self-pay | Admitting: Cardiology

## 2023-10-03 DIAGNOSIS — R079 Chest pain, unspecified: Secondary | ICD-10-CM

## 2023-10-03 NOTE — Telephone Encounter (Signed)
 The patient has been notified of the result and verbalized understanding.  All questions (if any) were answered. Bernett Dorothyann LABOR, RN 10/03/2023 9:14 AM   Order placed for echo

## 2023-10-07 NOTE — Procedures (Signed)
   HISTORY: 53 year old female, with history of seizure  TECHNIQUE:  This is a routine 16 channel EEG recording with one channel devoted to a limited EKG recording.  It was performed during wakefulness, drowsiness and asleep.  Hyperventilation and photic stimulation were performed as activating procedures.  There are minimum muscle and movement artifact noted.  Upon maximum arousal, posterior dominant waking rhythm consistent of rhythmic alpha range activity. Activities are symmetric over the bilateral posterior derivations and attenuated with eye opening.  Photic stimulation did not alter the tracing.  Hyperventilation produced mild/moderate buildup with higher amplitude and the slower activities noted.  During EEG recording, patient developed drowsiness and no deeper stage of sleep was achieved.  During EEG recording, there was no epileptiform discharge noted.  EKG demonstrate normal sinus rhythm.  CONCLUSION: This is a  normal awake EEG.  There is no electrodiagnostic evidence of epileptiform discharge.  Analese Sovine, M.D. Ph.D.  [facility] [address] [place], [place] [address] Phone: [phone] Fax:      [phone]

## 2023-10-23 ENCOUNTER — Ambulatory Visit: Attending: Cardiology

## 2023-10-23 DIAGNOSIS — R079 Chest pain, unspecified: Secondary | ICD-10-CM | POA: Insufficient documentation

## 2023-10-23 LAB — ECHOCARDIOGRAM COMPLETE
AR max vel: 3.36 cm2
AV Peak grad: 6.1 mmHg
Ao pk vel: 1.23 m/s
Area-P 1/2: 4.46 cm2
Calc EF: 66 %
S' Lateral: 2.8 cm
Single Plane A2C EF: 68.1 %
Single Plane A4C EF: 65.5 %

## 2023-10-24 ENCOUNTER — Telehealth: Payer: Self-pay | Admitting: Cardiology

## 2023-10-24 NOTE — Telephone Encounter (Signed)
 Patient calling in with results from test from 8/14, she stated that she doesn't have access to my chart. Please advise

## 2023-10-24 NOTE — Telephone Encounter (Signed)
 Patient notified and verbalized understanding. Patient had no further questions or concerns at this time.

## 2023-10-24 NOTE — Telephone Encounter (Signed)
  [name], MD 10/03/2023  8:57 AM EDT     Stress test shows no evidence of any blockages in the heart. The test did suggest that he pumping function of the heart may be mildly decreased, the nuclear stress is not great at measuring this accurately and she needs an echo for more accurate assessment, please order for chest pain   JINNY Ross MD

## 2023-10-31 ENCOUNTER — Telehealth: Payer: Self-pay | Admitting: Cardiology

## 2023-10-31 ENCOUNTER — Ambulatory Visit: Payer: Self-pay | Admitting: Cardiology

## 2023-10-31 NOTE — Telephone Encounter (Signed)
 The patient has been notified of the result and verbalized understanding.  All questions (if any) were answered. Littie CHRISTELLA Croak, CMA 10/31/2023 11:23 AM

## 2023-10-31 NOTE — Telephone Encounter (Signed)
-----   Message from Alvan Carrier sent at 10/31/2023 11:16 AM EDT ----- Echo shows heart pumping function is normal. Overall test looks good. Combined with her stress test her cardiac testing shows heart is healthy. Needs to f/u with pcp to consider noncardiac causes of  her symptoms, can f/u with us  just as needed  JINNY Alvan MD ----- Message ----- From: Interface, [employer] Sent: 10/23/2023  10:22 AM EDT To: Carrier JULIANNA Alvan, MD

## 2023-10-31 NOTE — Telephone Encounter (Signed)
 Pt requesting a c/b to go over Echo results.

## 2023-10-31 NOTE — Telephone Encounter (Signed)
 Patient notified of result.  Please refer to phone note from today for complete details.   Littie CHRISTELLA Croak, CMA 10/31/2023 11:24 AM

## 2023-11-07 ENCOUNTER — Ambulatory Visit: Admitting: Podiatry

## 2023-11-07 NOTE — ED Notes (Signed)
 Pt discharged home with family.  Pt verbalized understanding of discharge instructions.  Pt A&O x4 with steady gait and in no respiratory distress upon discharge.

## 2023-11-07 NOTE — ED Provider Notes (Signed)
 .                                                                                   Emergency Department Provider Note    ED Clinical Impression   Final diagnoses:  Vertigo (Primary)    ED Assessment/Plan    Condition: Stable Disposition: Discharge  This chart has been completed using Dragon Medical Dictation software, and while attempts have been made to ensure accuracy, certain words and phrases may not be transcribed as intended.   History   Chief Complaint  Patient presents with  . Dizziness  . Head Pain  . Hypotension  . Weakness  . Shortness of Breath    Dizziness Associated symptoms: shortness of breath   Weakness Associated symptoms: dizziness and shortness of breath   Shortness of Breath   Jennifer [name] is a 53 y.o. female  who presents today to the  emergency department complaining of headache, dizziness, palpitations.  She reports for the last couple months she has had sensation of her heart racing whenever she goes from sitting to standing.  She reports shortness of breath but denies chest pain, leg pain, leg swelling.  She also reports intermittent headaches.  Reports it feels like a tension headache.  She has a history of cerebrospinal fluid leak and had surgery. She denies this being the worst headache of her life or sudden onset headache.  She denies fever, chills, neck pain, neck stiffness, cough, fever, chills, congestion.  She has not followed up with her neurosurgery team recently.  She reports her blood pressure has been low at home, at presentation blood pressure is 106/69. She is unable to describe the dizziness but says it is worse when changing position    Allergies: is allergic to iodinated contrast media; metrizamide; shellfish containing products; amoxicillin ; vaccine adjuvant system, as01b liposomal; varicella-zoster virus glycoprotein e, recombinant; codeine; grass pollen; hydrocodone ; latex; and penicillin. Medications: has a  current medication list which includes the following long-term medication(s): albuterol , epinephrine, escitalopram oxalate, famotidine , gabapentin, ipratropium-albuterol , lamotrigine , levetiracetam , metformin, mometasone -formoterol , montelukast , oxybutynin , quetiapine, rosuvastatin, and topiramate . PMHx:  has a past medical history of Bipolar disorder    (CMS-HCC), Depression, Family history of neoplasm of breast, GERD (gastroesophageal reflux disease), History of DVT (deep vein thrombosis) (11/2016), IBS (irritable bowel syndrome), Osteoarthritis, PTSD (post-traumatic stress disorder), Schizophrenia    (CMS-HCC), and Seizures    (CMS-HCC). PSHx:  has a past surgical history that includes Total abdominal hysterectomy (07/2001); Cholecystectomy; Hernia repair; Esophagogastroduodenoscopy (11/27/2006); Bilateral salpingoophorectomy (Bilateral, 02/23/2003); Ectopic pregnancy surgery; Lipoma resection (07/2015); Hysterectomy; Oophorectomy; pr resect base ant cran fossa/extradurl (N/A, 11/28/2022); pr stereotactic comp assist proc,cranial,intradural (N/A, 11/28/2022); pr stereotactic comp assist proc,cranial,intradural (N/A, 11/28/2022); and pr craniotomy,repair,dural/csf leak (N/A, 11/28/2022). SocHx:  reports that she has been smoking cigarettes. She has been exposed to tobacco smoke. She has never used smokeless tobacco. She reports that she does not drink alcohol and does not use drugs. Allergies, Medications, Medical, Surgical, and Social History were reviewed as documented above.   Social Drivers of Health with Concerns   Tobacco Use: High Risk (09/11/2023)   Received from [facility]   Patient History   .  Smoking Tobacco Use: Every Day   . Smokeless Tobacco Use: Never   . Passive Exposure: Current  Alcohol Use: Not on file  Physical Activity: Inactive (05/28/2019)   Received from [facility]   Exercise Vital Sign   . On average, how many days per week do you engage in moderate to strenuous exercise  (like a brisk walk)?: 0 days   . On average, how many minutes do you engage in exercise at this level?: 0 min  Stress: Stress Concern Present (05/28/2019)   Received from [facility] - Occupational Stress Questionnaire   . Feeling of Stress : To some extent  Interpersonal Safety: Not on file  Substance Use: Not on file (12/11/2022)  Health Literacy: Not on file  Internet Connectivity: Not on file     Review Of Systems  Review of Systems  Respiratory:  Positive for shortness of breath.   Neurological:  Positive for dizziness.  All other systems reviewed and are negative.   Physical Exam   BP 106/69   Pulse 87   Temp 36.1 C (97 F) (Temporal)   Resp 18   Ht 167.6 cm (5' 6)   SpO2 98%   BMI 39.54 kg/m   Physical Exam Vitals and nursing note reviewed.  Constitutional:      Appearance: Normal appearance.  HENT:     Head: Normocephalic and atraumatic.     Nose: Nose normal.     Mouth/Throat:     Mouth: Mucous membranes are moist.  Eyes:     Extraocular Movements: Extraocular movements intact.     Conjunctiva/sclera: Conjunctivae normal.     Pupils: Pupils are equal, round, and reactive to light.     Comments: No nystagmus  Cardiovascular:     Rate and Rhythm: Normal rate and regular rhythm.     Pulses: Normal pulses.     Heart sounds: Normal heart sounds.  Pulmonary:     Effort: Pulmonary effort is normal. No respiratory distress.     Breath sounds: Normal breath sounds.  Abdominal:     Palpations: Abdomen is soft.     Tenderness: There is no abdominal tenderness.  Musculoskeletal:        General: Normal range of motion.     Cervical back: Normal range of motion and neck supple.  Skin:    General: Skin is warm.     Capillary Refill: Capillary refill takes less than 2 seconds.  Neurological:     General: No focal deficit present.     Mental Status: She is alert and oriented to person, place, and time.     Cranial  Nerves: No cranial nerve deficit.     Sensory: No sensory deficit.     Motor: No weakness.     Coordination: Coordination normal.     Gait: Gait normal.     ED Course  Medical Decision Making 53 year old female presents with palpitations, headache, shortness of breath.  She reports blood pressure has been low at home, pressures are normal in the emergency department.  She reports shortness of breath and palpitations with exertion.  She says her symptoms are worse when she goes from sitting to standing.  She has a remote history of brain surgery but has not followed up with them since symptoms started.  She denies change in vision, numbness, tingling, difficulty with speech.  She describes orthostatic hypotension, will check orthostatics on her.   Orthostatic vitals are negative, she reports  symptoms are worse when laying down flat, she now describes a room spinning sensation.  Concern for vertigo.  She has been able to ambulate without difficulty.  Will try dose of meclizine  She reports feeling better after the meclizine, denies dizziness, change in vision, nausea, vomiting or other symptoms.  Labs are unremarkable and CT scan did not show acute process.  Will discharge home with meclizine and have her follow-up with her doctors or return if symptoms worsen.  Patient feels comfortable with the plan and will follow-up with her doctors  Amount and/or Complexity of Data Reviewed ECG/medicine tests: ordered.     Procedures   Encounter Date: 11/07/23  ECG 12 Lead  Result Value   EKG Systolic BP    EKG Diastolic BP    EKG Ventricular Rate 68   EKG Atrial Rate 68   EKG P-R Interval 172   EKG QRS Duration 76   EKG Q-T Interval 430   EKG QTC Calculation 457   EKG Calculated P Axis 42   EKG Calculated R Axis 34   EKG Calculated T Axis 6   QTC Fredericia 448     ED Results Results for orders placed or performed during the hospital encounter of 11/07/23  Comprehensive Metabolic Panel   Result Value Ref Range   Sodium 142 135 - 145 mmol/L   Potassium 3.6 3.5 - 5.0 mmol/L   Chloride 110 (H) 98 - 107 mmol/L   CO2 21.5 21.0 - 32.0 mmol/L   Anion Gap 11 3 - 11 mmol/L   BUN 16 8 - 20 mg/dL   Creatinine 8.82 (H) 9.39 - 1.10 mg/dL   BUN/Creatinine Ratio 14    eGFR CKD-EPI (2021) Female 56 (L) >=60 mL/min/1.74m2   Glucose 87 70 - 179 mg/dL   Calcium 8.8 8.5 - 89.8 mg/dL   Albumin 3.7 3.5 - 5.0 g/dL   Total Protein 7.5 6.0 - 8.0 g/dL   Total Bilirubin 0.2 (L) 0.3 - 1.2 mg/dL   AST 7 (L) 15 - 40 U/L   ALT 12 12 - 78 U/L   Alkaline Phosphatase 72 46 - 116 U/L  Magnesium Level  Result Value Ref Range   Magnesium 2.4 1.6 - 2.6 mg/dL  Pro-BNP  Result Value Ref Range   PRO-BNP 22.0 0.0 - 125.0 pg/mL  hsTroponin I (serial 0-2-6H w/ delta)  Result Value Ref Range   hsTroponin I 4 <=34 ng/L  hsTroponin I - 2 Hour  Result Value Ref Range   hsTroponin I 4 <=34 ng/L   delta hsTroponin I 0 <=7 ng/L  ECG 12 Lead  Result Value Ref Range   EKG Systolic BP  mmHg   EKG Diastolic BP  mmHg   EKG Ventricular Rate 68 BPM   EKG Atrial Rate 68 BPM   EKG P-R Interval 172 ms   EKG QRS Duration 76 ms   EKG Q-T Interval 430 ms   EKG QTC Calculation 457 ms   EKG Calculated P Axis 42 degrees   EKG Calculated R Axis 34 degrees   EKG Calculated T Axis 6 degrees   QTC Fredericia 448 ms  CBC w/ Differential  Result Value Ref Range   WBC 6.7 4.0 - 10.5 10*9/L   RBC 4.96 3.80 - 5.10 10*12/L   HGB 11.5 11.5 - 15.0 g/dL   HCT 63.6 65.9 - 55.9 %   MCV 73.2 (L) 80.0 - 98.0 fL   MCH 23.2 (L) 27.0 - 34.0 pg   MCHC  31.7 (L) 32.0 - 36.0 g/dL   RDW 82.7 (H) 88.4 - 85.4 %   MPV 10.8 (H) 7.4 - 10.4 fL   Platelet 246 140 - 415 10*9/L   Neutrophils % 43.6 %   Lymphocytes % 45.9 %   Monocytes % 7.5 %   Eosinophils % 2.1 %   Basophils % 0.6 %   Absolute Neutrophils 2.9 1.8 - 7.8 10*9/L   Absolute Lymphocytes 3.1 0.7 - 4.5 10*9/L   Absolute Monocytes 0.5 0.1 - 1.0 10*9/L   Absolute  Eosinophils 0.1 0.0 - 0.4 10*9/L   Absolute Basophils 0.0 0.0 - 0.2 10*9/L   CT Head Wo Contrast Result Date: 11/07/2023 Exam: CT Brain without contrast.  History: Headache  Technique: Axial images of the head were obtained from the vertex through the skull base without the administration of contrast.  Sagittal and coronal reformatted images were obtained.   AEC (automated exposure control) and/or manual techniques such as size-specific kV and mAs are employed where appropriate to reduce radiation exposure for all CT exams.  Comparison: August 23, 2007  Findings: There is no acute intraparenchymal or extra-axial hemorrhage.  No mass effect or midline shift. The ventricles are not dilated. No acute calvarial abnormality. The mastoid air cells are clear. Sphenoid sinus mucus retention cyst.    No acute intracranial abnormality by CT.  Signed (Electronic Signature): 11/07/2023 4:08 PM Signed By: Ellouise Platt, MD  XR Chest 2 views Result Date: 11/07/2023 Exam:  Chest Two Views  History:  Shortness of breath  Technique:  2 views  Comparison:  None.  Findings:  Low lung volumes are present, which accentuate the cardiac silhouette and pulmonary vasculature, and causes crowding of the bronchovascular markings at the bases. There is no focal consolidation, pulmonary edema, pleural effusion, or pneumothorax. There is no acute osseous abnormality.     Low lung volumes. No radiographic evidence of pneumonia, pulmonary edema, or pleural effusion.  Signed (Electronic Signature): 11/07/2023 2:00 PM Signed By: Toribio Salt, MD   Medications Administered:  Medications  sodium chloride  0.9% (NS) bolus 1,000 mL (has no administration in time range)  meclizine (ANTIVERT) tablet 25 mg (25 mg Oral Given 11/07/23 1709)    Discharge Medications (Medications Prescribed during this  ED visit and Patient's Home Medications) :    Your Medication List     START taking these medications    meclizine 12.5 mg tablet Commonly  known as: ANTIVERT Take 1 tablet (12.5 mg total) by mouth two (2) times a day as needed.       ASK your doctor about these medications    albuterol  90 mcg/actuation inhaler Commonly known as: PROVENTIL  HFA;VENTOLIN  HFA Inhale.   AUSTEDO XR 36 mg Tb24 Generic drug: deutetrabenazine Take 1 tablet by mouth daily.   brompheniramine-pseudoephedrine-DM 2-30-10 mg/5 mL syrup TAKE 10 mls BY MOUTH EVERY 4 HOURS (Max Daily Dose 60 mls)   budesonide  0.5 mg/2 mL nebulizer solution Commonly known as: PULMICORT  Inhale 2 mL (0.5 mg total).   busPIRone  10 MG tablet Commonly known as: BUSPAR  Take 1 tablet (10 mg total) by mouth.   butalbital-acetaminophen -caffeine 50-325-40 mg per tablet Commonly known as: ESGIC   cetirizine  10 MG tablet Commonly known as: ZYRTEC  TAKE (1) TABLET BY MOUTH ONCE DAILY.   cycloSPORINE 0.05 % ophthalmic emulsion Commonly known as: RESTASIS 1 drop two (2) times a day.   doxycycline 100 MG tablet Commonly known as: VIBRA-TABS   EPINEPHrine 0.15 mg/0.15 mL Atin injection Commonly known as: AUVI  as directed Injection   ergocalciferol -1,250 mcg (50,000 unit) 1,250 mcg (50,000 unit) capsule Commonly known as: DRISDOL  TAKE 1 CAPSULE BY MOUTH ONCE A WEEK.   escitalopram oxalate 5 MG tablet Commonly known as: LEXAPRO Take 1 tablet (5 mg total) by mouth daily.   famotidine  20 MG tablet Commonly known as: PEPCID  1 tablet (20 mg total) nightly.   gabapentin 100 MG capsule Commonly known as: NEURONTIN Take 1 capsule (100 mg total) by mouth Three (3) times a day.   ipratropium-albuterol  0.5-2.5 mg/3 mL nebulizer Commonly known as: DUO-NEB Inhale 3 mL.   lamoTRIgine  200 MG tablet Commonly known as: LaMICtal    leflunomide 20 MG tablet Commonly known as: ARAVA Take 1 tablet (20 mg total) by mouth daily.   levETIRAcetam  750 mg Tb24 24 hr tablet Commonly known as: KEPPRA  XR Take 1 tablet (750 mg total) by mouth daily.   metFORMIN 500 MG  tablet Commonly known as: GLUCOPHAGE take 1 tablet by mouth every day with a meal   mometasone -formoterol  100-5 mcg/actuation Hfaa Inhale 2 puffs two (2) times a day.   montelukast  10 mg tablet Commonly known as: SINGULAIR    nitroglycerin  0.4 MG SL tablet Commonly known as: NITROSTAT  Place 1 tablet (0.4 mg total) under the tongue.   NURTEC ODT 75 mg Tbdl Generic drug: rimegepant Take 1 tab at onset of migraine.  May repeat in 2 hrs, if needed.  Max dose: 2 tabs/day. This is a 30 day prescription.   nystatin  100,000 unit/mL suspension Commonly known as: MYCOSTATIN  TAKE BY MOUTH 4 TIMES DAILY X 14 DAYS,RETAIN IN MOUTH LONG AS POSSIBLE THEN SWALLOW   omeprazole 40 MG capsule Commonly known as: PriLOSEC TAKE ONE CAPSULE BY MOUTH 1/2-1 HOUR BEFORE MORNING MEAL DAILY   ondansetron  4 MG disintegrating tablet Commonly known as: ZOFRAN -ODT Take 1 tablet (4 mg total) by mouth every eight (8) hours as needed.   oxybutynin  5 MG tablet Commonly known as: DITROPAN  Take 1 tablet (5 mg total) by mouth.   OZEMPIC 1 mg/dose (4 mg/3 mL) Pnij injection Generic drug: semaglutide inject 1 mg under the skin weekly   pantoprazole  40 MG tablet Commonly known as: Protonix  TAKE (1) TABLET BY MOUTH ONCE DAILY.   QUEtiapine 300 MG tablet Commonly known as: SEROQUEL   rizatriptan  10 MG disintegrating tablet Commonly known as: MAXALT -MLT Take 1 tablet (10 mg total) by mouth.   rosuvastatin 20 MG tablet Commonly known as: CRESTOR   sumatriptan  50 MG tablet Commonly known as: IMITREX  TAKE 1 TABLET BY MOUTH AT ONSET ON FOR MIGRAINE MAY REPEAT in 2 HOURS if needed no more THAN 2 TABLETS in 24 HOURS   topiramate  100 MG tablet Commonly known as: Topamax  Take 1 tablet (100 mg total) by mouth.   traZODone  50 MG tablet Commonly known as: DESYREL  Take 1 tablet (50 mg total) by mouth.          Peri Jackee Victory Mickey., MD 11/08/23 [phone]

## 2023-11-27 ENCOUNTER — Telehealth: Payer: Self-pay | Admitting: Neurology

## 2023-11-27 NOTE — Telephone Encounter (Addendum)
 Dr. Margaret- You are WID this afternoon. Dr. Onita out until next week. Can you review for suggestions? Should pt see NP urgently this week? Suggestions for next steps?   Pt last saw SS,NP 09/10/2023.  Went to ED/[facility]  11/07/23 for Vertigo (can be viewed in EPIC). Completed CT Head: D/c notes state they provided: meclizine 12.5 mg tablet, Take 1 tablet (12.5 mg total) by mouth two (2) times a day as needed.   Spoke w/ Sarah who asked I call pt first to get update from her.   Called pt at [phone]. She saw PCP today. Confirmed she is still taking meclizine prn. Pt reports she just started taking and helps some. However, dizziness has continued and she is concerned about this new sx that started about 1-2 months ago. Feels it started around time CSF leak started.   Also reports the following sx:  Intermittent, Auras in vision (small dots in both eyes). Off balance while walking. Feels weak/light headed when she goes to stand up. Did ortho static vitals at PCP office per pt that were abnormal (unable to view in [facility]). Denies any blurry/double vision. Headaches intermittent. Describes as sharp pain. No specific location pain located in head. Sx are similar of how they felt previously with CSF leak.   She confirmed she is taking topamax  and lamotrigine  as prescribed. Takes Nurtec or rizatriptan  prn for migraine. Feels Nurtec works better.   Did make pt aware that because this is new, may need referral from PCP. However, will send to covering MD since Dr. Onita out and will tag Sarah. We will call back with recommendation.

## 2023-11-27 NOTE — Telephone Encounter (Signed)
 Try now, it was marked sensitive by mistake

## 2023-11-27 NOTE — Telephone Encounter (Signed)
 Shawnee from [facility] called to speak  to Nurse about Getting PT seen sooner .  Pt is having reqally bad dizziness  which keep leading Pt to ER.  Shawnee is requesting a urgent appt for Pt if possible   Sh[facility] callback is  [phone]

## 2023-11-28 NOTE — Telephone Encounter (Signed)
 Call to patient, she is agreeable to appt next week with sarah

## 2023-12-02 NOTE — Progress Notes (Deleted)
 GI Office Note    Referring Provider: Alston Silvio BROCKS, FNP Primary Care Physician:  Bucio, Elsa C, FNP  Primary Gastroenterologist:  Chief Complaint   No chief complaint on file.    History of Present Illness   Jennifer [name] is a 52 y.o. female presenting today to discuss 5 year screening colonoscopy. Mother has h/o colon polyps.   Patient with recent cardiac evaluation for chest pain. Lexiscan  Myoview  recently with no evidence of ischemia. Study with low risk. LVEF with mildly reduced global function, nuclear stress EF 45%. Cardiology recommendations, overall ECHO combined with stress test are good, f/u with PCP to consider noncardiac causes of her symptoms, return to cardiology prn.     Prior Data   EGD 05/2021: -mild ERE (single 7mm erosion straddling GEJ) s/p dilation -small hh -normal examined duodenum  Colonoscopy 08/2018: -colon normal -repeat colon 5 yrs due to FH  Medications   Current Outpatient Medications  Medication Sig Dispense Refill   albuterol  (VENTOLIN  HFA) 108 (90 Base) MCG/ACT inhaler Inhale into the lungs every 6 (six) hours as needed for wheezing or shortness of breath.     AUSTEDO XR 36 MG TB24 Take 1 tablet by mouth daily.     budesonide  (PULMICORT ) 0.5 MG/2ML nebulizer solution Take 2 mLs (0.5 mg total) by nebulization 2 (two) times daily. 120 mL 11   busPIRone  (BUSPAR ) 10 MG tablet Take 10 mg by mouth 3 (three) times daily.     cetirizine  (ZYRTEC ) 10 MG tablet Take 1 tablet (10 mg total) by mouth every morning. 90 tablet 3   ENBREL SURECLICK 50 MG/ML injection Inject into the skin.     EPINEPHrine 0.15 MG/0.15ML IJ injection Inject 0.15 mg into the muscle as needed for anaphylaxis.     escitalopram (LEXAPRO) 5 MG tablet Take 5 mg by mouth daily.     famotidine  (PEPCID ) 20 MG tablet TAKE 1 TABLET BY MOUTH EVERYDAY AT BEDTIME 90 tablet 1   ipratropium-albuterol  (DUONEB) 0.5-2.5 (3) MG/3ML SOLN Take 3 mLs by nebulization every 6 (six) hours as  needed (shortness of breath, wheezing). 360 mL 5   lamoTRIgine  (LAMICTAL ) 200 MG tablet Take 1 tablet (200 mg total) by mouth 2 (two) times daily. 180 tablet 4   leflunomide (ARAVA) 20 MG tablet Take 20 mg by mouth daily.     metFORMIN (GLUCOPHAGE) 500 MG tablet Take 500 mg by mouth daily with breakfast.     montelukast  (SINGULAIR ) 10 MG tablet TAKE 1 TABLET BY MOUTH DAILY AT BEDTIME 90 tablet 2   omeprazole (PRILOSEC) 40 MG capsule Take 40 mg by mouth every morning.     oxybutynin  (DITROPAN ) 5 MG tablet Take 5 mg by mouth 3 (three) times daily. (Patient not taking: Reported on 09/11/2023)     OZEMPIC, 1 MG/DOSE, 4 MG/3ML SOPN Inject into the skin.     pantoprazole  (PROTONIX ) 40 MG tablet TAKE 1 TABLET BY MOUTH TWICE A DAY BEFORE MEALS (Patient not taking: Reported on 09/11/2023) 180 tablet 2   QUEtiapine (SEROQUEL XR) 200 MG 24 hr tablet Take 200 mg by mouth at bedtime. (Patient taking differently: Take 300 mg by mouth at bedtime.)     Rimegepant Sulfate (NURTEC) 75 MG TBDP Take 1 tab at onset of migraine.  May repeat in 2 hrs, if needed.  Max dose: 2 tabs/day. This is a 30 day prescription. (Patient not taking: Reported on 09/11/2023) 16 tablet 11   rizatriptan  (MAXALT -MLT) 10 MG disintegrating tablet Take 1  tablet (10 mg total) by mouth as needed for migraine. May repeat in 2 hours if needed 9 tablet 11   rosuvastatin (CRESTOR) 20 MG tablet Take 20 mg by mouth daily.     topiramate  (TOPAMAX ) 100 MG tablet Take 1 tablet (100 mg total) by mouth 2 (two) times daily. 180 tablet 4   traZODone  (DESYREL ) 50 MG tablet Take 50 mg by mouth at bedtime.     Vitamin D , Ergocalciferol , (DRISDOL ) 1.25 MG (50000 UT) CAPS capsule Take 1 capsule (50,000 Units total) by mouth every 7 (seven) days. 5 capsule 11   No current facility-administered medications for this visit.    Allergies   Allergies as of 12/03/2023 - Review Complete 09/20/2023  Allergen Reaction Noted   Metrizamide Rash and Swelling 10/17/2010    Shellfish allergy Swelling 10/29/2013   Amoxapine Nausea Only 10/01/2019   Amoxicillin  Other (See Comments) 12/13/2020   Other Itching and Swelling 01/26/2022   Penicillin g Other (See Comments) 12/13/2020   Zoster vaccine recombinant, adjuvanted  08/09/2022   Codeine Itching    Hydrocodone  Itching and Rash 12/03/2012   Ivp dye [iodinated contrast media] Rash 10/17/2010   Latex Rash 03/08/2010    Past Medical History   Past Medical History:  Diagnosis Date   Allergy    Anemia    Anxiety    Arthritis    neck   Arthritis    Asthma    Chronic kidney disease, stage 2 (mild) 01/21/2020   CSF leak    Diabetes mellitus without complication (HCC)    GERD (gastroesophageal reflux disease)    History of flexible sigmoidoscopy 1999   Normal to 60cm,   Hypertension    Neuromuscular disorder (HCC)    Ovarian cancer (HCC)    skin cancer   Panic attack    PTSD (post-traumatic stress disorder)    S/P endoscopy 03/2010   Nl, s/p 56-French Maloney dilator, biopsy benign   S/P endoscopy 2008   Mild gastritis   Seizures (HCC)    Sleep apnea    Substance abuse (HCC)    drug addiction    Past Surgical History   Past Surgical History:  Procedure Laterality Date   ABDOMINAL HYSTERECTOMY     ovarian tumors   CHOLECYSTECTOMY     COLONOSCOPY  03/01/2011   Rourk-External hemorrhoids/otherwise normal rectum and colon.   COLONOSCOPY WITH PROPOFOL  N/A 08/08/2018   entire examined colon is normal. The distal rectum and anal verge are normal on retroflexion view. Repeat in 5 years.    ESOPHAGOGASTRODUODENOSCOPY  2008   gastritis   ESOPHAGOGASTRODUODENOSCOPY  2012   Moderate sized hiatal hernia, non-H. pylori gastritis   ESOPHAGOGASTRODUODENOSCOPY N/A 11/17/2013   RMR: Mild erosive reflux esophagitis. Patulous EG junction   ESOPHAGOGASTRODUODENOSCOPY (EGD) WITH PROPOFOL  N/A 08/08/2018   Erosive reflux esophagitis. Dilated. Small hiatal hernia. Otherwise normal.    ESOPHAGOGASTRODUODENOSCOPY  (EGD) WITH PROPOFOL  N/A 06/02/2021   Procedure: ESOPHAGOGASTRODUODENOSCOPY (EGD) WITH PROPOFOL ;  Surgeon: Shaaron Lamar HERO, MD;  Location: [facility];  Service: Endoscopy;  Laterality: N/A;  2:00pm   HERNIA REPAIR     Incisional with mesh   KNEE ARTHROSCOPY     left knee surgery in [place]   MALONEY DILATION  08/08/2018   Procedure: MALONEY DILATION;  Surgeon: Shaaron Lamar HERO, MD;  Location: [facility];  Service: Endoscopy;;   MALONEY DILATION N/A 06/02/2021   Procedure: AGAPITO HODGKIN;  Surgeon: Shaaron Lamar HERO, MD;  Location: [facility];  Service: Endoscopy;  Laterality: N/A;   PARTIAL HYSTERECTOMY     SKIN CANCER EXCISION     left bicep   TONSILLECTOMY      Past Family History   Family History  Problem Relation Age of Onset   Crohn's disease Maternal Aunt    Breast cancer Maternal Grandmother    Cancer Maternal Grandmother    Colon cancer Maternal Grandfather        78   Pancreatic cancer Maternal Grandfather    Cancer Maternal Grandfather    Crohn's disease Cousin        x 2 cousins   Breast cancer Daughter 63   Arthritis Mother    COPD Mother    Depression Mother    Drug abuse Mother    Heart disease Mother    Hypertension Mother    Hyperlipidemia Mother    Learning disabilities Mother    Mental illness Mother    Alcohol abuse Mother    Colon polyps Mother 46       tubular adenoma   Early death Father 26       murder   Alcohol abuse Father    Drug abuse Father    Learning disabilities Father    Cancer Paternal Grandmother    Alcohol abuse Paternal Grandmother    Cancer Paternal Grandfather    Alcohol abuse Maternal Uncle    Alcohol abuse Paternal Uncle    Drug abuse Paternal Uncle     Past Social History   Social History   Socioeconomic History   Marital status: Married    Spouse name: Elsie   Number of children: Not on file   Years of education: Not on file   Highest education level: 11th grade  Occupational History   Occupation:  disabled    Associate Professor: NOT EMPLOYED  Tobacco Use   Smoking status: Every Day    Current packs/day: 0.50    Average packs/day: 0.3 packs/day for 39.6 years (10.3 ttl pk-yrs)    Types: Cigarettes    Start date: 05/03/1984    Passive exposure: Current   Smokeless tobacco: Never   Tobacco comments:    Patient states she is smoking based off her mood.   Vaping Use   Vaping status: Never Used  Substance and Sexual Activity   Alcohol use: Not Currently   Drug use: Not Currently   Sexual activity: Yes    Birth control/protection: Surgical  Other Topics Concern   Not on file  Social History Narrative   Lives at home with her husband.   Right handed.    1-2 cups of coffee 3 times per wk.    Intermittent soda.   Started back smoking   Social Drivers of Corporate Investment Banker Strain: Low Risk  (11/29/2022)   [facility]   Overall Financial Resource Strain (CARDIA)    Difficulty of Paying Living Expenses: Not hard at all  Food Insecurity: No Food Insecurity (11/29/2022)   Received from [facility]   Hunger Vital Sign    Within the past 12 months, you worried that your food would run out before you got the money to buy more.: Never true    Within the past 12 months, the food you bought just didn't last and you didn't have money to get more.: Never true  Transportation Needs: No Transportation Needs (11/29/2022)   Received from [facility] - Transportation    Lack of Transportation (Medical): No  Lack of Transportation (Non-Medical): No  Physical Activity: Inactive (05/28/2019)   Exercise Vital Sign    Days of Exercise per Week: 0 days    Minutes of Exercise per Session: 0 min  Stress: Stress Concern Present (05/28/2019)   [employer] of Occupational Health - Occupational Stress Questionnaire    Feeling of Stress : To some extent  Social Connections: Moderately Integrated (05/28/2019)   Social Connection and Isolation Panel     Frequency of Communication with Friends and Family: More than three times a week    Frequency of Social Gatherings with Friends and Family: More than three times a week    Attends Religious Services: 1 to 4 times per year    Active Member of [employer] or Organizations: No    Attends Banker Meetings: Never    Marital Status: Married  Catering Manager Violence: Not At Risk (05/28/2019)   Humiliation, Afraid, Rape, and Kick questionnaire    Fear of Current or Ex-Partner: No    Emotionally Abused: No    Physically Abused: No    Sexually Abused: No    Review of Systems   General: Negative for anorexia, weight loss, fever, chills, fatigue, weakness. Eyes: Negative for vision changes.  ENT: Negative for hoarseness, difficulty swallowing , nasal congestion. CV: Negative for chest pain, angina, palpitations, dyspnea on exertion, peripheral edema.  Respiratory: Negative for dyspnea at rest, dyspnea on exertion, cough, sputum, wheezing.  GI: See history of present illness. GU:  Negative for dysuria, hematuria, urinary incontinence, urinary frequency, nocturnal urination.  MS: Negative for joint pain, low back pain.  Derm: Negative for rash or itching.  Neuro: Negative for weakness, abnormal sensation, seizure, frequent headaches, memory loss,  confusion.  Psych: Negative for anxiety, depression, suicidal ideation, hallucinations.  Endo: Negative for unusual weight change.  Heme: Negative for bruising or bleeding. Allergy: Negative for rash or hives.  Physical Exam   There were no vitals taken for this visit.   General: Well-nourished, well-developed in no acute distress.  Head: Normocephalic, atraumatic.   Eyes: Conjunctiva pink, no icterus. Mouth: Oropharyngeal mucosa moist and pink  Neck: Supple without thyromegaly, masses, or lymphadenopathy.  Lungs: Clear to auscultation bilaterally.  Heart: Regular rate and rhythm, no murmurs rubs or gallops.  Abdomen: Bowel sounds are  normal, nontender, nondistended, no hepatosplenomegaly or masses,  no abdominal bruits or hernia, no rebound or guarding.   Rectal: not performed Extremities: No lower extremity edema. No clubbing or deformities.  Neuro: Alert and oriented x 4 , grossly normal neurologically.  Skin: Warm and dry, no rash or jaundice.   Psych: Alert and cooperative, normal mood and affect.  Labs   11/27/23: Cre 1.14, Tbili 0.3, AP 98, AST 9, ALT 17, B12 268 (211-911), WBC 7.1, Hgb 11.7, MCV 75.6, Plt 256, TSH 1.402 Imaging Studies   No results found.  Assessment/Plan:          Sonny RAMAN. Ezzard, MHS, PA-C [facility]

## 2023-12-03 ENCOUNTER — Ambulatory Visit: Admitting: Gastroenterology

## 2023-12-05 ENCOUNTER — Encounter: Payer: Self-pay | Admitting: Neurology

## 2023-12-05 ENCOUNTER — Ambulatory Visit (INDEPENDENT_AMBULATORY_CARE_PROVIDER_SITE_OTHER): Admitting: Neurology

## 2023-12-05 VITALS — BP 90/64 | HR 93 | Resp 15 | Ht 66.0 in

## 2023-12-05 DIAGNOSIS — G9600 Cerebrospinal fluid leak, unspecified: Secondary | ICD-10-CM | POA: Diagnosis not present

## 2023-12-05 DIAGNOSIS — G40909 Epilepsy, unspecified, not intractable, without status epilepticus: Secondary | ICD-10-CM

## 2023-12-05 DIAGNOSIS — G43709 Chronic migraine without aura, not intractable, without status migrainosus: Secondary | ICD-10-CM | POA: Diagnosis not present

## 2023-12-05 DIAGNOSIS — G932 Benign intracranial hypertension: Secondary | ICD-10-CM | POA: Diagnosis not present

## 2023-12-05 DIAGNOSIS — R519 Headache, unspecified: Secondary | ICD-10-CM

## 2023-12-05 NOTE — Progress Notes (Signed)
 Jennifer [name] 16-Feb-1970  ASSESSMENT AND PLAN 53 y.o. year old female   History of seizure 2.   History of idiopathic intracranial hypertension 3.   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   Headache with migraine features  -Worsening dizziness, headache, floaters, falls x 1-2 months.  -Urgent referral to [facility] Dr. Laurence for evaluation of papilledema with history of IIH  -PCP has already ordered MRI brain, scheduled 12/17/23 with and without contrast -Continue Topamax  100 mg twice a day, Lamictal  200 mg twice a day -Next steps: Consider repeat LP? Opening pressure Feb 2025 28 cm water . -Unclear etiology of orthostatic hypotension, today drop of 10 with BP. Follow up with PCP, cardiology -Keep follow up appointment with Dr. 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 on Nov 28 2022, which did stop her nasal leakage However she continued to have frequent headaches LP Feb 2025 opening pressure 28 cm water , May 2024 opening pressure 28 cm water  Labs 11/27/23: TSH 1.402, vitamin D  61.9, creatinine 1.13, normal sed rate, CRP, B12 268, Labs August 2025 Topamax  11.4, Lamictal  7.1 EEG normal August 2025  HISTORY  Jennifer [name] is a 53 year old female, accompanied by her husband, seen in request by primary care nurse practitioner [name], Jennifer C, to continue her neurological care at our clinic, initial evaluation was on January 26, 2022, she was previously seen by [facility] neurologist Dr. Milton, who has closed his neurological practice   I reviewed and summarized the referring note. PMHX, extensive records from Dr.Doonquah's office Bipolar, on polypharmacy Ovarian Cancer was treated in 2000,  DM HTN HLD PTSD  History of substance abuse, cocaine in young, quit for many years. Smoke,   She had seizure, 2015, frequent seizure spells with a  couple years of span, in the setting of extreme stress, preceding by headache, confusion, feeling nervous, mild bilateral hands tremor, then would involve into whole-body tremor, sometimes with bowel incontinence, lasting 10 to 15 minutes, she was treated on Keppra  slow titrating dose now 750 mg 4 tablets every night, does complains of side effect, also feeling anxious   Last reported seizure activity in 2015   She carries a diagnosis of bipolar disorder, PTSD, on polypharmacy for her mood disorder, this include lamotrigine  200 mg every night, Lexapro 5 mg daily, Seroquel 200 mg every night   She also has a history of chronic low back pain, frequent flareup, most recent flareup was few weeks ago, she get up after bending over to pick up her telephone cord, she felt her lower back has shifted, sharp radiating pain, she could not move for few minutes, then was able to walk, ever since then, she complains of worsening right-sided low back pain, radiating pain to right hip, gait abnormality, worsening pain bearing weight   She had long history of diabetes, no complaints of bilateral feet numbness tingling, urinary urgency, frequency,   She also complains of worsening headache, retro-orbital area, sometimes occipital region, shooting pain from front to back  UPDATE Feb 4th 2025: She was seen by [facility] in August 2024,  CT showed Small volume fluid and mucosal thickening along the left olfactory cleft underlying the cribriform plate , ethmoidal csf leakage on the termporal bone  Nasal endoscopy:  8mm area of submucosal fluid noted along perpendicular plate of the ethmoid on the left within the olfactory cleft without active extravasation of  CSF  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    Post surgical follow up on Jan 17 2023, nasal endoscopy exam showed no significant abnormality  She no longer has nasal discharge, however continue have  frequent headaches, transient left blurry vision, sharp pain to her left eye,  Recently also had bilateral knee joint injection for bilateral knee pain, had bilateral knee injection, her gait complaints most likely related to her knee issues  Update April 10, 2023 SS: LP 04/02/23, opening pressure 28 cm water . Dr. Onita ordered Nurtec, but insurance requires 2 triptans. Currently taking Imitrex  50 mg, not helping. Reports no change in headache since LP. Continues with frontal headache, pain behind both eyes, sharp pain. Constant pain for last 2 days. Sometimes feel nauseated. No other neuro symptoms. Continues with some mild gait instability. Remains on Lamictal  200 mg BID, no seizures.   Update September 10, 2023 SS: Reviewed ophthalmology note 08/29/2023, papilledema has resolved, exam was normal. Here with child psychotherapist, Regulatory Affairs Officer. One morning, smoking a cigarette, drinking coffee, both eyes twitching for a few minutes then it stopped, then back to normal. Remains on Lamictal  200 mg twice daily, claims in past blinking seizures from bright lights. Also on Topamax  100 mg BID. Headaches are better, every other day, severity is less. Are on the temples, pressure, brief, last a few minutes. Down about 5 lbs. She is not driving.   Update December 05, 2023 SS: EEG was normal. Good levels of Lamictal  (7.1) and Topamax  (11.4). Here with child psychotherapist, Regulatory Affairs Officer. When stands, feels she will pass out, get dizzy. BP drops when she stands, gets headaches, also when sitting up. Feels dizzy when walking, has fallen few times, started 1-2 months. Has been orthostatic, saw cardiology. Floaters, small dots that move in both eyes. Headaches, frontal, right eye, occipital area, worsens when stands. CT head 10/1 nothing acute. No nasal leaking.   PHYSICAL EXAMNIATION:    12/05/2023    8:18 AM 09/11/2023    9:02 AM 09/10/2023    2:18 PM  Vitals with BMI  Height 5' 6 5' 6 5' 6  Weight  238 lbs 3 oz 239 lbs 8 oz  BMI  38.46 38.67   Systolic 90 100 118  Diastolic 64 64 74  Pulse 93 92     Orthostatic VS for the past 24 hrs (Last 3 readings):  BP- Sitting Pulse- Sitting BP- Standing at 0 minutes Pulse- Standing at 0 minutes  12/05/23 0846 100/82 87 90/74 109   Gen: NAD, conversant, well nourised, well groomed       Physical Exam  General: The patient is alert and cooperative at the time of the examination. Tired appearing  Skin: No significant peripheral edema is noted.  Neurologic Exam  Mental status: The patient is alert and oriented x 3 at the time of the examination. The patient has apparent normal recent and remote memory, with an apparently normal attention span and concentration ability.  Cranial nerves: Facial symmetry is present. Speech is normal, no aphasia or dysarthria is noted. Extraocular movements are full. Visual fields are full.  Motor: The patient has good strength in all 4 extremities, moving slowly due to muscle spasms in her back  Sensory examination: Soft touch sensation is symmetric on the face, arms, and legs.  Coordination: The patient has good finger-nose-finger and heel-to-shin bilaterally but is slow and cautious   Gait and station: Seated in wheelchair  Reflexes: Deep tendon  reflexes are symmetric.  REVIEW OF SYSTEMS: Out of a complete 14 system review of symptoms, the patient complains only of the following symptoms, and all other reviewed systems are negative.  See HPI  ALLERGIES: Allergies  Allergen Reactions   Metrizamide Rash and Swelling    Hands swelling/rash   Shellfish Allergy Swelling    Swells tongue   Amoxapine Nausea Only   Amoxicillin  Other (See Comments)    WHAT IS THE REACTION?   Other Itching and Swelling    Shingles vaccine   Penicillin G Other (See Comments)    WHAT IS THE REACTION?   Penicillins     Other Reaction(s): Unknown   Zoster Vaccine Recombinant, Adjuvanted    Codeine Itching   Hydrocodone  Itching and Rash   Ivp Dye [Iodinated  Contrast Media] Rash    Hands swelling/rash   Latex Rash    HOME MEDICATIONS: Outpatient Medications Prior to Visit  Medication Sig Dispense Refill   albuterol  (VENTOLIN  HFA) 108 (90 Base) MCG/ACT inhaler Inhale into the lungs every 6 (six) hours as needed for wheezing or shortness of breath.     AUSTEDO XR 36 MG TB24 Take 1 tablet by mouth daily.     budesonide  (PULMICORT ) 0.5 MG/2ML nebulizer solution Take 2 mLs (0.5 mg total) by nebulization 2 (two) times daily. 120 mL 11   busPIRone  (BUSPAR ) 10 MG tablet Take 10 mg by mouth 3 (three) times daily.     cetirizine  (ZYRTEC ) 10 MG tablet Take 1 tablet (10 mg total) by mouth every morning. 90 tablet 3   ENBREL SURECLICK 50 MG/ML injection Inject into the skin.     EPINEPHrine 0.15 MG/0.15ML IJ injection Inject 0.15 mg into the muscle as needed for anaphylaxis.     escitalopram (LEXAPRO) 5 MG tablet Take 5 mg by mouth daily.     famotidine  (PEPCID ) 20 MG tablet TAKE 1 TABLET BY MOUTH EVERYDAY AT BEDTIME 90 tablet 1   ipratropium-albuterol  (DUONEB) 0.5-2.5 (3) MG/3ML SOLN Take 3 mLs by nebulization every 6 (six) hours as needed (shortness of breath, wheezing). 360 mL 5   lamoTRIgine  (LAMICTAL ) 200 MG tablet Take 1 tablet (200 mg total) by mouth 2 (two) times daily. 180 tablet 4   leflunomide (ARAVA) 20 MG tablet Take 20 mg by mouth daily.     montelukast  (SINGULAIR ) 10 MG tablet TAKE 1 TABLET BY MOUTH DAILY AT BEDTIME 90 tablet 2   omeprazole (PRILOSEC) 40 MG capsule Take 40 mg by mouth every morning.     oxybutynin  (DITROPAN ) 5 MG tablet Take 5 mg by mouth 3 (three) times daily.     OZEMPIC, 1 MG/DOSE, 4 MG/3ML SOPN Inject into the skin.     pantoprazole  (PROTONIX ) 40 MG tablet TAKE 1 TABLET BY MOUTH TWICE A DAY BEFORE MEALS 180 tablet 2   QUEtiapine (SEROQUEL XR) 200 MG 24 hr tablet Take 200 mg by mouth at bedtime. (Patient taking differently: Take 300 mg by mouth at bedtime.)     Rimegepant Sulfate (NURTEC) 75 MG TBDP Take 1 tab at onset of  migraine.  May repeat in 2 hrs, if needed.  Max dose: 2 tabs/day. This is a 30 day prescription. 16 tablet 11   rizatriptan  (MAXALT -MLT) 10 MG disintegrating tablet Take 1 tablet (10 mg total) by mouth as needed for migraine. May repeat in 2 hours if needed 9 tablet 11   rosuvastatin (CRESTOR) 20 MG tablet Take 20 mg by mouth daily.     topiramate  (TOPAMAX ) 100 MG  tablet Take 1 tablet (100 mg total) by mouth 2 (two) times daily. (Patient taking differently: Take 100 mg by mouth daily.) 180 tablet 4   traZODone  (DESYREL ) 50 MG tablet Take 50 mg by mouth at bedtime.     Vitamin D , Ergocalciferol , (DRISDOL ) 1.25 MG (50000 UT) CAPS capsule Take 1 capsule (50,000 Units total) by mouth every 7 (seven) days. 5 capsule 11   metFORMIN (GLUCOPHAGE) 500 MG tablet Take 500 mg by mouth daily with breakfast.     No facility-administered medications prior to visit.    PAST MEDICAL HISTORY: Past Medical History:  Diagnosis Date   Allergy    Anemia    Anxiety    Arthritis    neck   Arthritis    Asthma    Chronic kidney disease, stage 2 (mild) 01/21/2020   CSF leak    Diabetes mellitus without complication (HCC)    GERD (gastroesophageal reflux disease)    History of flexible sigmoidoscopy 1999   Normal to 60cm,   Hypertension    Neuromuscular disorder (HCC)    Ovarian cancer (HCC)    skin cancer   Panic attack    PTSD (post-traumatic stress disorder)    S/P endoscopy 03/2010   Nl, s/p 56-French Maloney dilator, biopsy benign   S/P endoscopy 2008   Mild gastritis   Seizures (HCC)    Sleep apnea    Substance abuse (HCC)    drug addiction    PAST SURGICAL HISTORY: Past Surgical History:  Procedure Laterality Date   ABDOMINAL HYSTERECTOMY     ovarian tumors   CHOLECYSTECTOMY     COLONOSCOPY  03/01/2011   Rourk-External hemorrhoids/otherwise normal rectum and colon.   COLONOSCOPY WITH PROPOFOL  N/A 08/08/2018   entire examined colon is normal. The distal rectum and anal verge are normal on  retroflexion view. Repeat in 5 years.    ESOPHAGOGASTRODUODENOSCOPY  2008   gastritis   ESOPHAGOGASTRODUODENOSCOPY  2012   Moderate sized hiatal hernia, non-H. pylori gastritis   ESOPHAGOGASTRODUODENOSCOPY N/A 11/17/2013   [facility]: Mild erosive reflux esophagitis. Patulous EG junction   ESOPHAGOGASTRODUODENOSCOPY (EGD) WITH PROPOFOL  N/A 08/08/2018   Erosive reflux esophagitis. Dilated. Small hiatal hernia. Otherwise normal.    ESOPHAGOGASTRODUODENOSCOPY (EGD) WITH PROPOFOL  N/A 06/02/2021   Procedure: ESOPHAGOGASTRODUODENOSCOPY (EGD) WITH PROPOFOL ;  Surgeon: Shaaron Lamar HERO, MD;  Location: [facility];  Service: Endoscopy;  Laterality: N/A;  2:00pm   HERNIA REPAIR     Incisional with mesh   KNEE ARTHROSCOPY     left knee surgery in [place]   MALONEY DILATION  08/08/2018   Procedure: MALONEY DILATION;  Surgeon: Shaaron Lamar HERO, MD;  Location: [facility];  Service: Endoscopy;;   MALONEY DILATION N/A 06/02/2021   Procedure: AGAPITO HODGKIN;  Surgeon: Shaaron Lamar HERO, MD;  Location: [facility];  Service: Endoscopy;  Laterality: N/A;   PARTIAL HYSTERECTOMY     SKIN CANCER EXCISION     left bicep   TONSILLECTOMY      FAMILY HISTORY: Family History  Problem Relation Age of Onset   Crohn's disease Maternal Aunt    Breast cancer Maternal Grandmother    Cancer Maternal Grandmother    Colon cancer Maternal Grandfather        78   Pancreatic cancer Maternal Grandfather    Cancer Maternal Grandfather    Crohn's disease Cousin        x 2 cousins   Breast cancer Daughter 75   Arthritis Mother    COPD Mother  Depression Mother    Drug abuse Mother    Heart disease Mother    Hypertension Mother    Hyperlipidemia Mother    Learning disabilities Mother    Mental illness Mother    Alcohol abuse Mother    Colon polyps Mother 7       tubular adenoma   Early death Father 37       murder   Alcohol abuse Father    Drug abuse Father    Learning disabilities Father    Cancer Paternal  Grandmother    Alcohol abuse Paternal Grandmother    Cancer Paternal Grandfather    Alcohol abuse Maternal Uncle    Alcohol abuse Paternal Uncle    Drug abuse Paternal Uncle     SOCIAL HISTORY: Social History   Socioeconomic History   Marital status: Married    Spouse name: Elsie   Number of children: Not on file   Years of education: Not on file   Highest education level: 11th grade  Occupational History   Occupation: disabled    Associate Professor: NOT EMPLOYED  Tobacco Use   Smoking status: Every Day    Current packs/day: 0.50    Average packs/day: 0.3 packs/day for 39.6 years (10.3 ttl pk-yrs)    Types: Cigarettes    Start date: 05/03/1984    Passive exposure: Current   Smokeless tobacco: Never   Tobacco comments:    Patient states she is smoking based off her mood.   Vaping Use   Vaping status: Never Used  Substance and Sexual Activity   Alcohol use: Not Currently   Drug use: Not Currently   Sexual activity: Yes    Birth control/protection: Surgical  Other Topics Concern   Not on file  Social History Narrative   Lives at home with her husband.   Right handed.    1-2 cups of coffee 3 times per wk.    Intermittent soda.   Started back smoking   Social Drivers of Corporate Investment Banker Strain: Low Risk  (11/29/2022)   Received [facility]   Overall Financial Resource Strain (CARDIA)    Difficulty of Paying Living Expenses: Not hard at all  Food Insecurity: No Food Insecurity (11/29/2022)   Received from [facility]   Hunger Vital Sign    Within the past 12 months, you worried that your food would run out before you got the money to buy more.: Never true    Within the past 12 months, the food you bought just didn't last and you didn't have money to get more.: Never true  Transportation Needs: No Transportation Needs (11/29/2022)   Received from [facility] - Transportation    Lack of Transportation (Medical): No    Lack of  Transportation (Non-Medical): No  Physical Activity: Inactive (05/28/2019)   Exercise Vital Sign    Days of Exercise per Week: 0 days    Minutes of Exercise per Session: 0 min  Stress: Stress Concern Present (05/28/2019)   [employer] of Occupational Health - Occupational Stress Questionnaire    Feeling of Stress : To some extent  Social Connections: Moderately Integrated (05/28/2019)   Social Connection and Isolation Panel    Frequency of Communication with Friends and Family: More than three times a week    Frequency of Social Gatherings with Friends and Family: More than three times a week    Attends Religious Services: 1 to 4 times per year  Active Member of Clubs or Organizations: No    Attends Banker Meetings: Never    Marital Status: Married  Catering Manager Violence: Not At Risk (05/28/2019)   Humiliation, Afraid, Rape, and Kick questionnaire    Fear of Current or Ex-Partner: No    Emotionally Abused: No    Physically Abused: No    Sexually Abused: No   PHYSICAL EXAM  Vitals:   12/05/23 0818  BP: 90/64  Pulse: 93  Resp: 15  SpO2: 92%  Height: 5' 6 (1.676 m)   Body mass index is 38.45 kg/m.  Generalized: Well developed, in no acute distress  Neurological examination  Mentation: Alert oriented to time, place, history taking. Follows all commands speech and language fluent Cranial nerve II-XII: Pupils were equal round reactive to light. Extraocular movements were full, visual field were full on confrontational test. Facial sensation and strength were normal. Head turning and shoulder shrug  were normal and symmetric. Motor: The motor testing reveals 5 over 5 strength of all 4 extremities. Good symmetric motor tone is noted throughout.  Sensory: Sensory testing is intact to soft touch on all 4 extremities. No evidence of extinction is noted.  Coordination: Cerebellar testing reveals good finger-nose-finger and heel-to-shin bilaterally.  Gait and  station: Gait is steady, but husband walks with her for support Reflexes: Deep tendon reflexes are symmetric and normal bilaterally.   DIAGNOSTIC DATA (LABS, IMAGING, TESTING) - I reviewed patient records, labs, notes, testing and imaging myself where available.  Lab Results  Component Value Date   WBC 6.2 09/10/2023   HGB 11.8 09/10/2023   HCT 39.8 09/10/2023   MCV 79 09/10/2023   PLT 237 09/10/2023      Component Value Date/Time   NA 141 09/10/2023 1517   K 4.0 09/10/2023 1517   K 4.1 02/23/2012 0000   CL 111 (H) 09/10/2023 1517   CO2 16 (L) 09/10/2023 1517   GLUCOSE 84 09/10/2023 1517   GLUCOSE 98 03/14/2022 0217   BUN 15 09/10/2023 1517   CREATININE 1.14 (H) 09/10/2023 1517   CREATININE 1.19 (H) 03/14/2022 0217   CALCIUM 9.8 09/10/2023 1517   CALCIUM 8.9 02/23/2012 0000   PROT 7.4 09/10/2023 1517   ALBUMIN 4.8 09/10/2023 1517   AST 11 09/10/2023 1517   AST 13 02/23/2012 0000   ALT 9 09/10/2023 1517   ALKPHOS 86 09/10/2023 1517   ALKPHOS 71 02/23/2012 0000   BILITOT <0.2 09/10/2023 1517   BILITOT 0.1 02/23/2012 0000   GFRNONAA >60 06/01/2020 1224   GFRNONAA 61 10/12/2016 1517   GFRAA 57 (L) 02/07/2018 1011   GFRAA 71 10/12/2016 1517   Lab Results  Component Value Date   CHOL 172 02/07/2018   HDL 49 02/07/2018   LDLCALC 108 (H) 02/07/2018   TRIG 74 02/07/2018   CHOLHDL 3.5 02/07/2018   Lab Results  Component Value Date   HGBA1C 5.4 02/07/2018   Lab Results  Component Value Date   VITAMINB12 321 04/11/2018   Lab Results  Component Value Date   TSH 1.750 02/07/2018   Lauraine Gayland MANDES, DNP  [facility] [address] [place], [place] [address] [phone]

## 2023-12-05 NOTE — Patient Instructions (Signed)
 Proceed with MRI of the brain, continue Topamax , Lamictal .  Urgent referral to ophthalmology

## 2023-12-12 ENCOUNTER — Telehealth: Payer: Self-pay | Admitting: Neurology

## 2023-12-12 DIAGNOSIS — G932 Benign intracranial hypertension: Secondary | ICD-10-CM

## 2023-12-12 NOTE — Telephone Encounter (Signed)
 Jennifer [name] child psychotherapist called to inform MD that [facility]  is not taking new [name]  and was trying to see [name] referral  can be sent to different Office . [name] stated He normally  goes to [name]  and would like  to see if they can perform test .  [name] callback  [phone]

## 2023-12-13 NOTE — Telephone Encounter (Signed)
 Call to [facility], pt saw Dr Laurence in July 2025, can call [phone] to schedule an appt   Call and left voicemail for [name] making him aware and also called patient/husband and made aware.

## 2023-12-17 NOTE — Progress Notes (Unsigned)
 Chief Complaint: Urinary leakage  History of Present Illness:  53 yo female presents for E/M of urinary incontinence.   Past Medical History:  Past Medical History:  Diagnosis Date   Allergy    Anemia    Anxiety    Arthritis    neck   Arthritis    Asthma    Chronic kidney disease, stage 2 (mild) 01/21/2020   CSF leak    Diabetes mellitus without complication (HCC)    GERD (gastroesophageal reflux disease)    History of flexible sigmoidoscopy 1999   Normal to 60cm,   Hypertension    Neuromuscular disorder (HCC)    Ovarian cancer (HCC)    skin cancer   Panic attack    PTSD (post-traumatic stress disorder)    S/P endoscopy 03/2010   Nl, s/p 56-French Maloney dilator, biopsy benign   S/P endoscopy 2008   Mild gastritis   Seizures (HCC)    Sleep apnea    Substance abuse (HCC)    drug addiction    Past Surgical History:  Past Surgical History:  Procedure Laterality Date   ABDOMINAL HYSTERECTOMY     ovarian tumors   CHOLECYSTECTOMY     COLONOSCOPY  03/01/2011   Rourk-External hemorrhoids/otherwise normal rectum and colon.   COLONOSCOPY WITH PROPOFOL  N/A 08/08/2018   entire examined colon is normal. The distal rectum and anal verge are normal on retroflexion view. Repeat in 5 years.    ESOPHAGOGASTRODUODENOSCOPY  2008   gastritis   ESOPHAGOGASTRODUODENOSCOPY  2012   Moderate sized hiatal hernia, non-H. pylori gastritis   ESOPHAGOGASTRODUODENOSCOPY N/A 11/17/2013   RMR: Mild erosive reflux esophagitis. Patulous EG junction   ESOPHAGOGASTRODUODENOSCOPY (EGD) WITH PROPOFOL  N/A 08/08/2018   Erosive reflux esophagitis. Dilated. Small hiatal hernia. Otherwise normal.    ESOPHAGOGASTRODUODENOSCOPY (EGD) WITH PROPOFOL  N/A 06/02/2021   Procedure: ESOPHAGOGASTRODUODENOSCOPY (EGD) WITH PROPOFOL ;  Surgeon: Shaaron Lamar HERO, MD;  Location: [facility];  Service: Endoscopy;  Laterality: N/A;  2:00pm   HERNIA REPAIR     Incisional with mesh   KNEE ARTHROSCOPY     left knee  surgery in [place]   MALONEY DILATION  08/08/2018   Procedure: MALONEY DILATION;  Surgeon: Shaaron Lamar HERO, MD;  Location: [facility];  Service: Endoscopy;;   MALONEY DILATION N/A 06/02/2021   Procedure: AGAPITO HODGKIN;  Surgeon: Shaaron Lamar HERO, MD;  Location: [facility];  Service: Endoscopy;  Laterality: N/A;   PARTIAL HYSTERECTOMY     SKIN CANCER EXCISION     left bicep   TONSILLECTOMY      Allergies:  Allergies  Allergen Reactions   Metrizamide Rash and Swelling    Hands swelling/rash   Shellfish Allergy Swelling    Swells tongue   Amoxapine Nausea Only   Amoxicillin  Other (See Comments)    WHAT IS THE REACTION?   Other Itching and Swelling    Shingles vaccine   Penicillin G Other (See Comments)    WHAT IS THE REACTION?   Penicillins     Other Reaction(s): Unknown   Zoster Vaccine Recombinant, Adjuvanted    Codeine Itching   Hydrocodone  Itching and Rash   Ivp Dye [Iodinated Contrast Media] Rash    Hands swelling/rash   Latex Rash    Family History:  Family History  Problem Relation Age of Onset   Crohn's disease Maternal Aunt    Breast cancer Maternal Grandmother    Cancer Maternal Grandmother    Colon cancer Maternal Grandfather  78   Pancreatic cancer Maternal Grandfather    Cancer Maternal Grandfather    Crohn's disease Cousin        x 2 cousins   Breast cancer Daughter 54   Arthritis Mother    COPD Mother    Depression Mother    Drug abuse Mother    Heart disease Mother    Hypertension Mother    Hyperlipidemia Mother    Learning disabilities Mother    Mental illness Mother    Alcohol abuse Mother    Colon polyps Mother 25       tubular adenoma   Early death Father 58       murder   Alcohol abuse Father    Drug abuse Father    Learning disabilities Father    Cancer Paternal Grandmother    Alcohol abuse Paternal Grandmother    Cancer Paternal Grandfather    Alcohol abuse Maternal Uncle    Alcohol abuse Paternal Uncle    Drug  abuse Paternal Uncle     Social History:  Social History   Tobacco Use   Smoking status: Every Day    Current packs/day: 0.50    Average packs/day: 0.3 packs/day for 39.6 years (10.3 ttl pk-yrs)    Types: Cigarettes    Start date: 05/03/1984    Passive exposure: Current   Smokeless tobacco: Never   Tobacco comments:    Patient states she is smoking based off her mood.   Vaping Use   Vaping status: Never Used  Substance Use Topics   Alcohol use: Not Currently   Drug use: Not Currently    Review of symptoms:  Constitutional:  Negative for unexplained weight loss, night sweats, fever, chills ENT:  Negative for nose bleeds, sinus pain, painful swallowing CV:  Negative for chest pain, shortness of breath, exercise intolerance, palpitations, loss of consciousness Resp:  Negative for cough, wheezing, shortness of breath GI:  Negative for nausea, vomiting, diarrhea, bloody stools GU:  Positives noted in HPI; otherwise negative for gross hematuria, dysuria, urinary incontinence Neuro:  Negative for seizures, poor balance, limb weakness, slurred speech Psych:  Negative for lack of energy, depression, anxiety Endocrine:  Negative for polydipsia, polyuria, symptoms of hypoglycemia (dizziness, hunger, sweating) Hematologic:  Negative for anemia, purpura, petechia, prolonged or excessive bleeding, use of anticoagulants  Allergic:  Negative for difficulty breathing or choking as a result of exposure to anything; no shellfish allergy; no allergic response (rash/itch) to materials, foods  Physical exam: There were no vitals taken for this visit. GENERAL APPEARANCE:  Well appearing, well developed, well nourished, NAD HEENT: Atraumatic, Normocephalic, oropharynx clear. NECK: Supple without lymphadenopathy or thyromegaly. LUNGS: Clear to auscultation bilaterally. HEART: Regular Rate and Rhythm without murmurs, gallops, or rubs. ABDOMEN: Soft, non-tender, No Masses. EXTREMITIES: Moves all  extremities well.  Without clubbing, cyanosis, or edema. NEUROLOGIC:  Alert and oriented x 3, normal gait, CN II-XII grossly intact.  MENTAL STATUS:  Appropriate. BACK:  Non-tender to palpation.  No CVAT SKIN:  Warm, dry and intact.    Results: No results found for this or any previous visit (from the past 24 hours).  I have reviewed prior patient's records  I have reviewed referring/prior physicians records  I have reviewed urinalysis  I have reviewed prior urine cultures  I reviewed prior imaging studies  Assessment: No diagnosis found.   Plan: ***

## 2023-12-18 ENCOUNTER — Ambulatory Visit: Admitting: Urology

## 2023-12-18 VITALS — BP 118/79 | HR 93

## 2023-12-18 DIAGNOSIS — R32 Unspecified urinary incontinence: Secondary | ICD-10-CM

## 2023-12-18 DIAGNOSIS — N3281 Overactive bladder: Secondary | ICD-10-CM

## 2023-12-18 LAB — URINALYSIS, ROUTINE W REFLEX MICROSCOPIC
Bilirubin, UA: NEGATIVE
Glucose, UA: NEGATIVE
Ketones, UA: NEGATIVE
Leukocytes,UA: NEGATIVE
Nitrite, UA: NEGATIVE
Protein,UA: NEGATIVE
RBC, UA: NEGATIVE
Specific Gravity, UA: 1.015 (ref 1.005–1.030)
Urobilinogen, Ur: 0.2 mg/dL (ref 0.2–1.0)
pH, UA: 6 (ref 5.0–7.5)

## 2023-12-18 LAB — BLADDER SCAN AMB NON-IMAGING: Scan Result: 38

## 2023-12-18 NOTE — Progress Notes (Signed)
 Bladder Scan completed today due to reason of urinary incontinence  Patient can void prior to the bladder scan. Bladder scan result: 38  Performed By: Exie DASEN. CMA  Additional notes- Patient is scheduled to follow up with MD

## 2023-12-18 NOTE — Progress Notes (Signed)
 Chart reviewed, agree above plan ?

## 2023-12-20 NOTE — Telephone Encounter (Signed)
 Call to [facility] retina specialist. They do treat IIH.

## 2023-12-20 NOTE — Telephone Encounter (Signed)
 DSS, Garen Hoard request to have referral ophthalmology sent to [facility].  Could you create another referral for me to fax?

## 2023-12-21 NOTE — Telephone Encounter (Signed)
 Pt husband called  stating that MD that Pt was seeing is no longer there and appt were to far out is it possible  for pt to be referred to different facility , Informed Pt of nurse note . Pt husband would like Pt to be seen somewhere else

## 2023-12-24 ENCOUNTER — Telehealth: Payer: Self-pay | Admitting: Neurology

## 2023-12-24 NOTE — Telephone Encounter (Signed)
 Crrection to 11/13 note. [facility] retina specialist does not treat IIH.  Husband would like referral sent [facility]. States that provider she previously saw is no longer there and would like to be send somewhere else.

## 2023-12-24 NOTE — Telephone Encounter (Signed)
 I will place urgent referral to ophthalmology. Our referrals can process where patient can be seen first available if her prior eye doctor is no longer an option. I see she had MRI brain without contrast 12/17/23 at [facility] that was unremarkable.   Orders Placed This Encounter  Procedures   Ambulatory referral to Ophthalmology

## 2023-12-24 NOTE — Telephone Encounter (Signed)
 Called and relayed the following msg to pt:  I spoke with Lauraine Born and she placed urgent referral to ophthalmology. Our referrals can process where you can be seen first available if her prior eye doctor is no longer an option. Lauraine saw you had MRI brain without contrast 12/17/23 at [facility] that was unremarkable.   Pt voiced gratitude and understanding

## 2023-12-24 NOTE — Telephone Encounter (Signed)
 Referral for ophthalmology fax to [facility]. Phone: [phone], Fax: [phone]

## 2024-01-08 ENCOUNTER — Ambulatory Visit: Admitting: Podiatry

## 2024-01-08 ENCOUNTER — Other Ambulatory Visit: Payer: Self-pay | Admitting: Podiatry

## 2024-01-08 DIAGNOSIS — L600 Ingrowing nail: Secondary | ICD-10-CM | POA: Diagnosis not present

## 2024-01-08 MED ORDER — NEOMYCIN-POLYMYXIN-HC 3.5-10000-1 OT SUSP: OTIC | 0 refills | Status: AC

## 2024-01-08 NOTE — Patient Instructions (Signed)

## 2024-01-08 NOTE — Progress Notes (Signed)
 error

## 2024-01-10 NOTE — Telephone Encounter (Signed)
 Refaxed Referral for Ophthalmology to [facility] (Urgent)  [phone] Fax: [phone]

## 2024-01-10 NOTE — Telephone Encounter (Signed)
 [facility] has asked the referral be re faxed to them

## 2024-01-10 NOTE — Progress Notes (Signed)
  Subjective:  Patient ID: Jennifer [name], female    DOB: [birth year],  MRN: [id number]  Chief Complaint  Patient presents with   Nail Problem    Bilateral hallux nail discomfort pt stated that she clipped them down but she has a lot of tenderness at the tip of her toes     53 y.o. female presents with the above complaint. History confirmed with patient.  They often feel ingrown.  Has not had issues with infections.  She has controlled type 2 diabetes with an A1c of 5.4%.  Objective:  Physical Exam: warm, good capillary refill, no trophic changes or ulcerative lesions, normal DP and PT pulses, normal sensory exam, and bilateral hallux ingrown both borders.  Assessment:  No diagnosis found.   Plan:  Patient was evaluated and treated and all questions answered.    Ingrown Nail, bilaterally -Patient elects to proceed with minor surgery to remove ingrown toenail today. Consent reviewed and signed by patient. -Ingrown nail excised. See procedure note. -Educated on post-procedure care including soaking. Written instructions provided and reviewed. -Rx for Cortisporin sent to pharmacy. -Advised on signs and symptoms of infection developing.  We discussed that the phenol likely will create some redness and edema and tenderness around the nailbed as long as it is localized this is to be expected.  Will return as needed if any infection signs develop  Procedure: Excision of Ingrown Toenail Location: Bilateral 1st toe medial and lateral nail borders. Anesthesia: Lidocaine  1% plain; 1.5 mL and Marcaine  0.5% plain; 1.5 mL, digital block. Skin Prep: Betadine. Dressing: Silvadene; telfa; dry, sterile, compression dressing. Technique: Following skin prep, the toe was exsanguinated and a tourniquet was secured at the base of the toe. The affected nail border was freed, split with a nail splitter, and excised. Chemical matrixectomy was then performed with phenol and irrigated out with alcohol. The  tourniquet was then removed and sterile dressing applied. Disposition: Patient tolerated procedure well.    No follow-ups on file.

## 2024-01-16 ENCOUNTER — Ambulatory Visit (INDEPENDENT_AMBULATORY_CARE_PROVIDER_SITE_OTHER): Admitting: Gastroenterology

## 2024-01-16 ENCOUNTER — Telehealth: Payer: Self-pay | Admitting: Gastroenterology

## 2024-01-16 ENCOUNTER — Encounter: Payer: Self-pay | Admitting: Gastroenterology

## 2024-01-16 VITALS — BP 128/84 | HR 79 | Temp 98.2°F | Ht 66.0 in | Wt 238.8 lb

## 2024-01-16 DIAGNOSIS — K219 Gastro-esophageal reflux disease without esophagitis: Secondary | ICD-10-CM | POA: Diagnosis not present

## 2024-01-16 DIAGNOSIS — Z83719 Family history of colon polyps, unspecified: Secondary | ICD-10-CM

## 2024-01-16 NOTE — Telephone Encounter (Signed)
 Please let patient know that after review of her records with neurology, and due to nature of colonoscopy being done for screening, we need to wait on colonoscopy until she has seen the eye doctor for optic nerve evaluation and sees her neurologist again. They were discussing another lumbar puncture and pressure check. The opening pressure has been stable 03/2023 and 06/2022 (both times 28 cm water ).   She sees eye doctor end of 01/2024. She sees neurology first of 03/2024. Let's plan to see her back after she sees her neurologist. Please have them make an appointment after seeing neurology. They should also ask for approval for colonoscopy based on the findings that day.

## 2024-01-16 NOTE — Patient Instructions (Signed)
 We will work on getting colonoscopy completed however I will need to discuss with anesthesia and neurology regarding active neurological concerns first. We will be in touch in the next 48 hours.  If you have severe or progressively worsening headache, consider ER evaluation.

## 2024-01-16 NOTE — Progress Notes (Signed)
 GI Office Note    Referring Provider: Alston Silvio BROCKS, FNP Primary Care Physician:  Bucio, Elsa C, FNP  Primary Gastroenterologist: Ozell Hollingshead, MD   Chief Complaint   Chief Complaint  Patient presents with   Colonoscopy    History of Present Illness     Discussed the use of AI scribe software for clinical note transcription with the patient, who gave verbal consent to proceed.  History of Present Illness Jennifer [name] is a 53 year old female who presents to consider colonoscopy due to FH of colon polyps. She is due for her 5 year high risk screening exam. Colonoscopy was put off earlier this year as she was undergoing cardiac evaluation (Lexiscan  Myoview  with no evidence of ischemia, low risk study. ECHO completed due to concern for reduced EF of 45% on nuclear stress but overall ECHO was reassuring. She is having concerns regarding headaches, increased intracranial pressures since her CSF leak repair. She is scheduled to see ophthalmology to have optic nerve evaluated. Possible need to go back and have intracranial pressures rechecked. She is seen at [facility] (last seen 12/05/23) and previously had surgery at [facility] in 11/2022. History of idiopathic intracranial hypertension with CSF leak. Per last OV, urgent referral for evaluation of papilledema. MRI brain completed 12/17/2023 and unremarkable. Next steps consider repeat LP? Opening pressure 03/2023 28cm water  (same as 06/2022).  She reports intermittent heartburn and indigestion with chest pressure that can radiate to her back, along with bloating and occasional nocturnal regurgitation of food despite famotidine  and omeprazole. She notes she continues to have find on the side of her mouth and pillow. BMs regular. No abdominal pain. No melena, brbpr.    She has episodic vomiting associated with severe headaches, but these are infrequent. She also has dizziness with standing that can lead to falls.   Prior Data      Results    EGD 05/2021: -mild erosive reflux esophagitis. Dilated -small hiatal hernia -normal duodenal bulb and second portion of duodenum   Colonoscopy 08/2018: -normal exam -five year screening colonoscopy, due to FH   Medications   Current Outpatient Medications  Medication Sig Dispense Refill   AUSTEDO XR 36 MG TB24 Take 1 tablet by mouth daily.     busPIRone  (BUSPAR ) 10 MG tablet Take 10 mg by mouth 3 (three) times daily.     cetirizine  (ZYRTEC ) 10 MG tablet Take 1 tablet (10 mg total) by mouth every morning. 90 tablet 3   ENBREL SURECLICK 50 MG/ML injection Inject into the skin.     EPINEPHrine 0.15 MG/0.15ML IJ injection Inject 0.15 mg into the muscle as needed for anaphylaxis.     escitalopram (LEXAPRO) 10 MG tablet Take 10 mg by mouth daily.     famotidine  (PEPCID ) 20 MG tablet TAKE 1 TABLET BY MOUTH EVERYDAY AT BEDTIME 90 tablet 1   lamoTRIgine  (LAMICTAL ) 200 MG tablet Take 1 tablet (200 mg total) by mouth 2 (two) times daily. 180 tablet 4   leflunomide (ARAVA) 20 MG tablet Take 20 mg by mouth daily.     metFORMIN (GLUCOPHAGE) 500 MG tablet Take 500 mg by mouth daily.     montelukast  (SINGULAIR ) 10 MG tablet TAKE 1 TABLET BY MOUTH DAILY AT BEDTIME 90 tablet 2   neomycin -polymyxin-hydrocortisone  (CORTISPORIN) 3.5-10000-1 OTIC suspension Apply 1-2 drops daily after soaking and cover with bandaid 10 mL 0   omeprazole (PRILOSEC) 40 MG capsule Take 40 mg by mouth every morning.  OZEMPIC, 1 MG/DOSE, 4 MG/3ML SOPN Inject into the skin.     QUEtiapine (SEROQUEL) 300 MG tablet Take 300 mg by mouth at bedtime.     rosuvastatin (CRESTOR) 20 MG tablet Take 20 mg by mouth daily.     topiramate  (TOPAMAX ) 100 MG tablet Take 1 tablet (100 mg total) by mouth 2 (two) times daily. 180 tablet 4   traZODone  (DESYREL ) 50 MG tablet Take 50 mg by mouth at bedtime.     No current facility-administered medications for this visit.    Allergies   Allergies as of 01/16/2024 - Review  Complete 01/16/2024  Allergen Reaction Noted   Metrizamide Rash and Swelling 10/17/2010   Shellfish allergy Swelling 10/29/2013   Amoxapine Nausea Only 10/01/2019   Amoxicillin  Other (See Comments) 12/13/2020   Other Itching and Swelling 01/26/2022   Penicillin g Other (See Comments) 12/13/2020   Penicillins  08/09/2022   Zoster vaccine recombinant, adjuvanted  08/09/2022   Codeine Itching    Hydrocodone  Itching and Rash 12/03/2012   Ivp dye [iodinated contrast media] Rash 10/17/2010   Latex Rash 03/08/2010     Past Medical History   Past Medical History:  Diagnosis Date   Allergy    Anemia    Anxiety    Arthritis    neck   Arthritis    Asthma    Chronic kidney disease, stage 2 (mild) 01/21/2020   CSF leak    Diabetes mellitus without complication (HCC)    GERD (gastroesophageal reflux disease)    History of flexible sigmoidoscopy 1999   Normal to 60cm,   Hypertension    Neuromuscular disorder (HCC)    Ovarian cancer (HCC)    skin cancer   Panic attack    PTSD (post-traumatic stress disorder)    S/P endoscopy 03/2010   Nl, s/p 56-French Maloney dilator, biopsy benign   S/P endoscopy 2008   Mild gastritis   Seizures (HCC)    Sleep apnea    Substance abuse (HCC)    drug addiction    Past Surgical History   Past Surgical History:  Procedure Laterality Date   ABDOMINAL HYSTERECTOMY     ovarian tumors   CHOLECYSTECTOMY     COLONOSCOPY  03/01/2011   Rourk-External hemorrhoids/otherwise normal rectum and colon.   COLONOSCOPY WITH PROPOFOL  N/A 08/08/2018   entire examined colon is normal. The distal rectum and anal verge are normal on retroflexion view. Repeat in 5 years.    ESOPHAGOGASTRODUODENOSCOPY  2008   gastritis   ESOPHAGOGASTRODUODENOSCOPY  2012   Moderate sized hiatal hernia, non-H. pylori gastritis   ESOPHAGOGASTRODUODENOSCOPY N/A 11/17/2013   RMR: Mild erosive reflux esophagitis. Patulous EG junction   ESOPHAGOGASTRODUODENOSCOPY (EGD) WITH PROPOFOL   N/A 08/08/2018   Erosive reflux esophagitis. Dilated. Small hiatal hernia. Otherwise normal.    ESOPHAGOGASTRODUODENOSCOPY (EGD) WITH PROPOFOL  N/A 06/02/2021   Procedure: ESOPHAGOGASTRODUODENOSCOPY (EGD) WITH PROPOFOL ;  Surgeon: Shaaron Lamar HERO, MD;  [facility];  Service: Endoscopy;  Laterality: N/A;  2:00pm   HERNIA REPAIR     Incisional with mesh   KNEE ARTHROSCOPY     left knee surgery in [place]   MALONEY DILATION  08/08/2018   Procedure: MALONEY DILATION;  Surgeon: Shaaron Lamar HERO, MD;  [facility]: [facility];  Service: Endoscopy;;   MALONEY DILATION N/A 06/02/2021   Procedure: AGAPITO HODGKIN;  Surgeon: Shaaron Lamar HERO, MD;  [facility]: [facility];  Service: Endoscopy;  Laterality: N/A;   PARTIAL HYSTERECTOMY     SKIN CANCER EXCISION  left bicep   TONSILLECTOMY      Past Family History   Family History  Problem Relation Age of Onset   Crohn's disease Maternal Aunt    Breast cancer Maternal Grandmother    Cancer Maternal Grandmother    Colon cancer Maternal Grandfather        78   Pancreatic cancer Maternal Grandfather    Cancer Maternal Grandfather    Crohn's disease Cousin        x 2 cousins   Breast cancer Daughter 76   Arthritis Mother    COPD Mother    Depression Mother    Drug abuse Mother    Heart disease Mother    Hypertension Mother    Hyperlipidemia Mother    Learning disabilities Mother    Mental illness Mother    Alcohol abuse Mother    Colon polyps Mother 61       tubular adenoma   Early death Father 24       murder   Alcohol abuse Father    Drug abuse Father    Learning disabilities Father    Cancer Paternal Grandmother    Alcohol abuse Paternal Grandmother    Cancer Paternal Grandfather    Alcohol abuse Maternal Uncle    Alcohol abuse Paternal Uncle    Drug abuse Paternal Uncle     Past Social History   Social History   Socioeconomic History   Marital status: Married    Spouse name: Elsie   Number of children: Not  on file   Years of education: Not on file   Highest education level: 11th grade  Occupational History   Occupation: disabled    Associate Professor: NOT EMPLOYED  Tobacco Use   Smoking status: Every Day    Current packs/day: 0.50    Average packs/day: 0.3 packs/day for 39.7 years (10.4 ttl pk-yrs)    Types: Cigarettes    Start date: 05/03/1984    Passive exposure: Current   Smokeless tobacco: Never   Tobacco comments:    Patient states she is smoking based off her mood.   Vaping Use   Vaping status: Never Used  Substance and Sexual Activity   Alcohol use: Not Currently   Drug use: Not Currently   Sexual activity: Yes    Birth control/protection: Surgical  Other Topics Concern   Not on file  Social History Narrative   Lives at home with her husband.   Right handed.    1-2 cups of coffee 3 times per wk.    Intermittent soda.   Started back smoking   Social Drivers of Corporate Investment Banker Strain: Low Risk (11/29/2022)   [facility]   Overall Financial Resource Strain (CARDIA)    Difficulty of Paying Living Expenses: Not hard at all  Food Insecurity: No Food Insecurity (11/29/2022)   Received from [facility]   Hunger Vital Sign    Within the past 12 months, you worried that your food would run out before you got the money to buy more.: Never true    Within the past 12 months, the food you bought just didn't last and you didn't have money to get more.: Never true  Transportation Needs: No Transportation Needs (11/29/2022)   Received from [facility] - Transportation    Lack of Transportation (Medical): No    Lack of Transportation (Non-Medical): No  Physical Activity: Inactive (05/28/2019)   Exercise Vital Sign  Days of Exercise per Week: 0 days    Minutes of Exercise per Session: 0 min  Stress: Stress Concern Present (05/28/2019)   [employer] of Occupational Health - Occupational Stress Questionnaire    Feeling of Stress : To  some extent  Social Connections: Moderately Integrated (05/28/2019)   Social Connection and Isolation Panel    Frequency of Communication with Friends and Family: More than three times a week    Frequency of Social Gatherings with Friends and Family: More than three times a week    Attends Religious Services: 1 to 4 times per year    Active Member of [employer] or Organizations: No    Attends Banker Meetings: Never    Marital Status: Married  Catering Manager Violence: Not At Risk (05/28/2019)   Humiliation, Afraid, Rape, and Kick questionnaire    Fear of Current or Ex-Partner: No    Emotionally Abused: No    Physically Abused: No    Sexually Abused: No    Review of Systems   General: Negative for anorexia, weight loss, fever, chills, fatigue, +weakness. ENT: Negative for hoarseness, difficulty swallowing , nasal congestion. CV: Negative for chest pain, angina, palpitations, dyspnea on exertion, peripheral edema.  Respiratory: Negative for dyspnea at rest, dyspnea on exertion, cough, sputum, wheezing.  GI: See history of present illness. GU:  Negative for dysuria, hematuria, urinary incontinence, urinary frequency, nocturnal urination.  Endo: Negative for unusual weight change.     Physical Exam   BP 128/84   Pulse 79   Temp 98.2 F (36.8 C) (Oral)   Ht 5' 6 (1.676 m)   Wt 238 lb 12.8 oz (108.3 kg)   SpO2 98%   BMI 38.54 kg/m    General: Well-nourished, well-developed in no acute distress. Complains of headahce Eyes: No icterus. Mouth: Oropharyngeal mucosa moist and pink   Lungs: Clear to auscultation bilaterally.  Heart: Regular rate and rhythm, no murmurs rubs or gallops.  Abdomen: Bowel sounds are normal, nontender, nondistended, no hepatosplenomegaly or masses,  no abdominal bruits or hernia , no rebound or guarding.  Rectal: not performed Extremities: No lower extremity edema. No clubbing or deformities. Neuro: Alert and oriented x 4   Skin: Warm and dry,  no jaundice.   Psych: Alert and cooperative, normal mood and affect.  Labs   11/27/2023: K 3.7, Na 144, Cre 1.14, Tbili 0.3, AP 98, AST 9, ALT 17, alb 3.7, b12 268, wbc 7.1, Hgb 11.7, MCV 75.6, Plt 256, sed rate 20, crp 6.6, vit d 61.9, tsh 1.402 Imaging Studies   No results found.  Assessment/Plan:   Assessment and Plan Assessment & Plan High-risk colorectal cancer screening Due for colonoscopy due to family history of colon polyps (mother). Patient denies bowel concerns. She has underwent cardiac evaluation earlier this year due to chest pain which is felt to be noncardiac in nature. She is actively seeing neurology due to ongoing headaches, vision concerns after CSF leak repair last year. At one point she reports having papilledema but not seen on subsequent follow up evaluations. She has appointment to be checked 12/29 by ophthalmologist. She is anxious to complete colonoscopy. She notes this morning episode of vomiting in setting of bad headache.  -Reviewed previous neurological records, patients opening pressures have been stable, Opening pressure 03/2023 28cm water  (same as 06/2022). -given this is a screening exam (although high risk), would advise awaiting upcoming evaluation for papilledema (12/29) and neurology follow up 03/17/24) -advised patient to go to ED  if worsening or severe headache.    Gastroesophageal reflux disease  -continue omeprazole.            Sonny RAMAN. Ezzard, MHS, PA-C [facility]

## 2024-01-17 NOTE — Telephone Encounter (Signed)
 Pt was made aware and verbalized understanding.

## 2024-02-18 NOTE — Progress Notes (Unsigned)
 "  Assessment: -OAB-wet.  No improvement with behavioral modification  - Nocturnal enuresis, most likely from her untreated OSA   Plan: - Sent in prescription for solifenacin   - I will have her come back in about 6 months for recheck  History of Present Illness:  11.11.2025: 54 yo female presents for E/M of urinary incontinence.  Send symptoms have been bothersome for a few years, but getting more serious.  She has urinary frequency, urgency, urgency incontinence.  Urgency incontinence happens day and night.  She also has nocturnal enuresis.  She has untreated OSA.  No frequent urinary tract infections.  No dysuria.  1.13.2026: Here for f/u.  Unfortunately, she is still going through 3-4 pull-ups a day.  She has had no luck with self-improvement.   Past Medical History:  Past Medical History:  Diagnosis Date   Allergy    Anemia    Anxiety    Arthritis    neck   Arthritis    Asthma    Chronic kidney disease, stage 2 (mild) 01/21/2020   CSF leak    Diabetes mellitus without complication (HCC)    GERD (gastroesophageal reflux disease)    History of flexible sigmoidoscopy 1999   Normal to 60cm,   Hypertension    Neuromuscular disorder (HCC)    Ovarian cancer (HCC)    skin cancer   Panic attack    PTSD (post-traumatic stress disorder)    S/P endoscopy 03/2010   Nl, s/p 56-French Maloney dilator, biopsy benign   S/P endoscopy 2008   Mild gastritis   Seizures (HCC)    Sleep apnea    Substance abuse (HCC)    drug addiction    Past Surgical History:  Past Surgical History:  Procedure Laterality Date   ABDOMINAL HYSTERECTOMY     ovarian tumors   CHOLECYSTECTOMY     COLONOSCOPY  03/01/2011   Rourk-External hemorrhoids/otherwise normal rectum and colon.   COLONOSCOPY WITH PROPOFOL  N/A 08/08/2018   entire examined colon is normal. The distal rectum and anal verge are normal on retroflexion view. Repeat in 5 years.    ESOPHAGOGASTRODUODENOSCOPY  2008   gastritis    ESOPHAGOGASTRODUODENOSCOPY  2012   Moderate sized hiatal hernia, non-H. pylori gastritis   ESOPHAGOGASTRODUODENOSCOPY N/A 11/17/2013   RMR: Mild erosive reflux esophagitis. Patulous EG junction   ESOPHAGOGASTRODUODENOSCOPY (EGD) WITH PROPOFOL  N/A 08/08/2018   Erosive reflux esophagitis. Dilated. Small hiatal hernia. Otherwise normal.    ESOPHAGOGASTRODUODENOSCOPY (EGD) WITH PROPOFOL  N/A 06/02/2021   Procedure: ESOPHAGOGASTRODUODENOSCOPY (EGD) WITH PROPOFOL ;  Surgeon: Shaaron Lamar HERO, MD;  [facility];  Service: Endoscopy;  Laterality: N/A;  2:00pm   HERNIA REPAIR     Incisional with mesh   KNEE ARTHROSCOPY     left knee surgery in [place]   MALONEY DILATION  08/08/2018   Procedure: MALONEY DILATION;  Surgeon: Shaaron Lamar HERO, MD;  [facility];  Service: Endoscopy;;   MALONEY DILATION N/A 06/02/2021   Procedure: AGAPITO HODGKIN;  Surgeon: Shaaron Lamar HERO, MD;  [facility];  Service: Endoscopy;  Laterality: N/A;   PARTIAL HYSTERECTOMY     SKIN CANCER EXCISION     left bicep   TONSILLECTOMY      Allergies:  Allergies  Allergen Reactions   Metrizamide Rash and Swelling    Hands swelling/rash   Shellfish Allergy Swelling    Swells tongue   Amoxapine Nausea Only   Amoxicillin  Other (See Comments)    WHAT IS THE REACTION?  Other Itching and Swelling    Shingles vaccine   Penicillin G Other (See Comments)    WHAT IS THE REACTION?   Penicillins     Other Reaction(s): Unknown   Zoster Vaccine Recombinant, Adjuvanted    Codeine Itching   Hydrocodone  Itching and Rash   Ivp Dye [Iodinated Contrast Media] Rash    Hands swelling/rash   Latex Rash    Family History:  Family History  Problem Relation Age of Onset   Crohn's disease Maternal Aunt    Breast cancer Maternal Grandmother    Cancer Maternal Grandmother    Colon cancer Maternal Grandfather        78   Pancreatic cancer Maternal Grandfather    Cancer Maternal Grandfather    Crohn's  disease Cousin        x 2 cousins   Breast cancer Daughter 62   Arthritis Mother    COPD Mother    Depression Mother    Drug abuse Mother    Heart disease Mother    Hypertension Mother    Hyperlipidemia Mother    Learning disabilities Mother    Mental illness Mother    Alcohol abuse Mother    Colon polyps Mother 25       tubular adenoma   Early death Father 61       murder   Alcohol abuse Father    Drug abuse Father    Learning disabilities Father    Cancer Paternal Grandmother    Alcohol abuse Paternal Grandmother    Cancer Paternal Grandfather    Alcohol abuse Maternal Uncle    Alcohol abuse Paternal Uncle    Drug abuse Paternal Uncle     Social History:  Social History   Tobacco Use   Smoking status: Every Day    Current packs/day: 0.50    Average packs/day: 0.3 packs/day for 39.8 years (10.4 ttl pk-yrs)    Types: Cigarettes    Start date: 05/03/1984    Passive exposure: Current   Smokeless tobacco: Never   Tobacco comments:    Patient states she is smoking based off her mood.   Vaping Use   Vaping status: Never Used  Substance Use Topics   Alcohol use: Not Currently   Drug use: Not Currently      "

## 2024-02-19 ENCOUNTER — Ambulatory Visit: Admitting: Urology

## 2024-02-19 VITALS — BP 128/82 | HR 80

## 2024-02-19 DIAGNOSIS — R32 Unspecified urinary incontinence: Secondary | ICD-10-CM

## 2024-02-19 DIAGNOSIS — N3944 Nocturnal enuresis: Secondary | ICD-10-CM | POA: Diagnosis not present

## 2024-02-19 DIAGNOSIS — N3281 Overactive bladder: Secondary | ICD-10-CM | POA: Diagnosis not present

## 2024-02-19 LAB — URINALYSIS, ROUTINE W REFLEX MICROSCOPIC
Bilirubin, UA: NEGATIVE
Glucose, UA: NEGATIVE
Leukocytes,UA: NEGATIVE
Nitrite, UA: NEGATIVE
RBC, UA: NEGATIVE
Specific Gravity, UA: 1.02 (ref 1.005–1.030)
Urobilinogen, Ur: 1 mg/dL (ref 0.2–1.0)
pH, UA: 7.5 (ref 5.0–7.5)

## 2024-02-19 MED ORDER — SOLIFENACIN SUCCINATE 10 MG PO TABS: 10.0000 mg | ORAL_TABLET | Freq: Every day | ORAL | 11 refills | Status: AC

## 2024-02-20 ENCOUNTER — Telehealth: Payer: Self-pay

## 2024-02-20 ENCOUNTER — Ambulatory Visit: Admitting: Podiatry

## 2024-02-20 VITALS — Ht 66.0 in | Wt 238.0 lb

## 2024-02-20 DIAGNOSIS — L600 Ingrowing nail: Secondary | ICD-10-CM

## 2024-02-20 NOTE — Progress Notes (Signed)
"  °  Subjective:  Patient ID: Jennifer [name], female    DOB: June 04, 1970,  MRN: [id number]  Chief Complaint  Patient presents with   Nail Problem    RM 2 Patient is her for left foot, great toe is curling and inflamed. Pt states after a previous ingrown procedure, nail has curved inward.  Pt states painful to wear closed-toed. Pt states a possible nail avulsion today.    54 y.o. female presents with the above complaint. History confirmed with patient.  She returns for follow-up feels that her toenail has curved in since the procedure  Objective:  Physical Exam: warm, good capillary refill, no trophic changes or ulcerative lesions, normal DP and PT pulses, normal sensory exam, and matricectomy sites are healing well there is some scab present.  Has longitudinal ridge in the nail without deep ingrown or recurrence.  Assessment:   1. Ingrowing left great toenail      Plan:  Patient was evaluated and treated and all questions answered.  I evaluated her nail there is no sign of infection or recurrence of the ingrown she does have a longitudinal ridge along the medial nail this involves the entirety of the nail plate I discussed with her nail has not changed shape in the last 6 weeks and that likely her nail was deformed like this prior to the procedure, it has not been long of time for the nail to grow to develop such a deformity.  I discussed with her total removal of the nail plate would be the only option.  Would not recommend this at this time and give this time to heal further.  Follow-up with me as needed.  Return if symptoms worsen or fail to improve.   "

## 2024-02-20 NOTE — Telephone Encounter (Signed)
 Return called to patient about Rx. Pt state's she was able to get Rx from pharmacy.

## 2024-02-21 ENCOUNTER — Encounter (INDEPENDENT_AMBULATORY_CARE_PROVIDER_SITE_OTHER): Payer: Self-pay | Admitting: *Deleted

## 2024-03-17 ENCOUNTER — Ambulatory Visit: Admitting: Neurology

## 2024-09-03 ENCOUNTER — Ambulatory Visit: Admitting: Urology
